# Patient Record
Sex: Female | Born: 1950 | Race: White | Hispanic: No | Marital: Married | Smoking: Never smoker
Health system: Southern US, Community
[De-identification: ages and names within clinical notes are randomized; demographics above are authoritative.]

## PROBLEM LIST (undated history)

## (undated) DIAGNOSIS — I10 Essential (primary) hypertension: Secondary | ICD-10-CM

## (undated) DIAGNOSIS — R053 Chronic cough: Secondary | ICD-10-CM

## (undated) DIAGNOSIS — R05 Cough: Secondary | ICD-10-CM

## (undated) DIAGNOSIS — J479 Bronchiectasis, uncomplicated: Secondary | ICD-10-CM

## (undated) DIAGNOSIS — J189 Pneumonia, unspecified organism: Secondary | ICD-10-CM

## (undated) DIAGNOSIS — C9000 Multiple myeloma not having achieved remission: Secondary | ICD-10-CM

## (undated) DIAGNOSIS — R112 Nausea with vomiting, unspecified: Secondary | ICD-10-CM

## (undated) DIAGNOSIS — Z9889 Other specified postprocedural states: Secondary | ICD-10-CM

## (undated) DIAGNOSIS — T8859XA Other complications of anesthesia, initial encounter: Secondary | ICD-10-CM

## (undated) DIAGNOSIS — H409 Unspecified glaucoma: Secondary | ICD-10-CM

## (undated) DIAGNOSIS — C801 Malignant (primary) neoplasm, unspecified: Secondary | ICD-10-CM

## (undated) DIAGNOSIS — E785 Hyperlipidemia, unspecified: Secondary | ICD-10-CM

## (undated) DIAGNOSIS — R198 Other specified symptoms and signs involving the digestive system and abdomen: Secondary | ICD-10-CM

## (undated) DIAGNOSIS — M8580 Other specified disorders of bone density and structure, unspecified site: Secondary | ICD-10-CM

## (undated) DIAGNOSIS — K219 Gastro-esophageal reflux disease without esophagitis: Secondary | ICD-10-CM

## (undated) DIAGNOSIS — M549 Dorsalgia, unspecified: Secondary | ICD-10-CM

## (undated) DIAGNOSIS — T4145XA Adverse effect of unspecified anesthetic, initial encounter: Secondary | ICD-10-CM

## (undated) DIAGNOSIS — Z8719 Personal history of other diseases of the digestive system: Secondary | ICD-10-CM

## (undated) DIAGNOSIS — R0600 Dyspnea, unspecified: Secondary | ICD-10-CM

## (undated) DIAGNOSIS — M4310 Spondylolisthesis, site unspecified: Secondary | ICD-10-CM

## (undated) DIAGNOSIS — M1990 Unspecified osteoarthritis, unspecified site: Secondary | ICD-10-CM

## (undated) HISTORY — DX: Gastro-esophageal reflux disease without esophagitis: K21.9

## (undated) HISTORY — PX: ABDOMINOPLASTY: SUR9

## (undated) HISTORY — PX: HIP ARTHROPLASTY: SHX981

## (undated) HISTORY — DX: Chronic cough: R05.3

## (undated) HISTORY — DX: Dorsalgia, unspecified: M54.9

## (undated) HISTORY — PX: TONSILLECTOMY: SUR1361

## (undated) HISTORY — DX: Spondylolisthesis, site unspecified: M43.10

## (undated) HISTORY — DX: Malignant (primary) neoplasm, unspecified: C80.1

## (undated) HISTORY — DX: Essential (primary) hypertension: I10

## (undated) HISTORY — PX: FLUOROSCOPY GUIDANCE: CATH118240

## (undated) HISTORY — PX: UMBILICAL HERNIA REPAIR: SHX196

## (undated) HISTORY — PX: COLONOSCOPY: SHX174

## (undated) HISTORY — PX: TOE FUSION: SHX1070

## (undated) HISTORY — DX: Other specified symptoms and signs involving the digestive system and abdomen: R19.8

## (undated) HISTORY — PX: BREAST ENHANCEMENT SURGERY: SHX7

## (undated) HISTORY — PX: JOINT REPLACEMENT: SHX530

## (undated) HISTORY — DX: Cough: R05

---
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
## 1992-07-15 HISTORY — PX: THIGH LIFT: SHX2496

## 1997-07-15 HISTORY — PX: BLEPHAROPLASTY: SUR158

## 2000-07-15 HISTORY — PX: UMBILICAL HERNIA REPAIR: SHX196

## 2000-07-15 HISTORY — PX: ABDOMINOPLASTY: SUR9

## 2002-07-15 HISTORY — PX: FACIAL COSMETIC SURGERY: SHX629

## 2002-07-15 HISTORY — PX: REMOVAL OF BILATERAL TISSUE EXPANDERS WITH PLACEMENT OF BILATERAL BREAST IMPLANTS: SHX6431

## 2012-07-15 HISTORY — PX: DECOMPRESSION CORE HIP: SUR398

## 2015-10-14 HISTORY — PX: ANKLE SURGERY: SHX546

## 2016-07-15 HISTORY — PX: CYSTOURETHROSCOPY: SHX476

## 2017-06-14 HISTORY — PX: PARTIAL COLECTOMY: SHX5273

## 2017-06-26 HISTORY — PX: LAPAROSCOPIC PARTIAL COLECTOMY: SHX5907

## 2017-07-15 HISTORY — PX: MOHS SURGERY: SUR867

## 2018-06-14 HISTORY — PX: BASAL CELL CARCINOMA EXCISION: SHX1214

## 2018-09-16 ENCOUNTER — Other Ambulatory Visit: Payer: Self-pay | Admitting: Neurosurgery

## 2018-09-18 ENCOUNTER — Other Ambulatory Visit: Payer: Self-pay | Admitting: *Deleted

## 2018-10-07 ENCOUNTER — Encounter: Payer: Self-pay | Admitting: Vascular Surgery

## 2018-10-19 ENCOUNTER — Telehealth: Payer: Self-pay | Admitting: *Deleted

## 2018-10-19 NOTE — Telephone Encounter (Signed)
Left voice mail message. 10/28/2018 appointment with Dr. Scot Dock cancelled due to deferred surgery with Dr. Vertell Limber.

## 2018-10-26 ENCOUNTER — Inpatient Hospital Stay (HOSPITAL_COMMUNITY): Admission: RE | Admit: 2018-10-26 | Payer: Medicare Other | Source: Ambulatory Visit

## 2018-10-28 ENCOUNTER — Encounter: Payer: Self-pay | Admitting: Vascular Surgery

## 2018-11-03 ENCOUNTER — Encounter (HOSPITAL_COMMUNITY): Admission: RE | Payer: Self-pay | Source: Ambulatory Visit

## 2018-11-03 ENCOUNTER — Inpatient Hospital Stay (HOSPITAL_COMMUNITY): Admission: RE | Admit: 2018-11-03 | Payer: Medicare Other | Source: Ambulatory Visit | Admitting: Neurosurgery

## 2018-11-03 SURGERY — ANTERIOR LUMBAR FUSION 1 LEVEL
Anesthesia: General

## 2018-11-26 ENCOUNTER — Telehealth: Payer: Self-pay | Admitting: Vascular Surgery

## 2018-11-26 ENCOUNTER — Other Ambulatory Visit: Payer: Self-pay | Admitting: *Deleted

## 2018-11-26 NOTE — Telephone Encounter (Signed)
-----   Message from Willy Eddy, RN sent at 11/25/2018  2:53 PM EDT ----- Regarding: Office appointment Please schedule for office appt with Dr. Scot Dock. "ALIF L5-S1 with Dr. Vertell Limber". Surgery scheduled for 12/29/2018. Remind patient to bring all films, CT etc. To this appointment. Thanks, B

## 2018-11-26 NOTE — Telephone Encounter (Signed)
sch lvm mld ltr 12/16/2018 1120am f/u MD

## 2018-12-16 ENCOUNTER — Other Ambulatory Visit: Payer: Self-pay

## 2018-12-16 ENCOUNTER — Encounter: Payer: Self-pay | Admitting: Vascular Surgery

## 2018-12-16 ENCOUNTER — Ambulatory Visit (INDEPENDENT_AMBULATORY_CARE_PROVIDER_SITE_OTHER): Payer: Medicare Other | Admitting: Vascular Surgery

## 2018-12-16 VITALS — BP 123/87 | HR 86 | Temp 97.3°F | Resp 18 | Ht 65.0 in | Wt 138.7 lb

## 2018-12-16 DIAGNOSIS — M5136 Other intervertebral disc degeneration, lumbar region: Secondary | ICD-10-CM | POA: Diagnosis not present

## 2018-12-16 DIAGNOSIS — M5137 Other intervertebral disc degeneration, lumbosacral region: Secondary | ICD-10-CM

## 2018-12-16 NOTE — Progress Notes (Signed)
REASON FOR CONSULT:    Preop visit prior to ALIF.  The consult is requested by Dr. Vertell Limber.  ASSESSMENT & PLAN:   DEGENERATIVE DISC DISEASE L5-S1: The patient appears to be a good candidate for anterior retroperitoneal exposure of L5-S1. I have reviewed our role in exposure of the spine in order to allow anterior lumbar interbody fusion at the appropriate levels. We have discussed the potential complications of surgery, including but not limited to, arterial or venous injury, thrombosis, or bleeding. We have also discussed the potential risks of wound healing problems, the development of a hernia, nerve injury, leg swelling, or other unpredictable medical problems. All the patient's questions were answered and they are agreeable to proceed. Her surgery is scheduled for 12/29/2018.  [name], MD, FACS Beeper [phone] Office: [phone]   HPI:   Amanda Conner is a pleasant 68 y.o. female, who has had a long history of back pain.  She did have a history of scoliosis and also had a left hip replacement in 2015.  Her back pain is gradually progressed and is been especially severe over the last year.  She is failed conservative treatment.  She experiences pain in her back that radiates down the back of her right leg.  I do not get any history of claudication, rest pain, or nonhealing ulcers.  She is had no previous history of DVT.  She did have a laparoscopic partial colectomy at [facility] in 2018.  This was the descending colon.  She is also had a previous umbilical hernia repair and tummy tuck.  She denies any previous history of DVT or phlebitis.  Past Medical History:  Diagnosis Date  . Back pain   . Cancer (HCC)    basal cell nose  . Chronic cough   . Gastrointestinal symptoms   . GERD (gastroesophageal reflux disease)   . Hypertension   . Spondylisthesis     Family History  Problem Relation Age of Onset  . Hypertension Mother   . COPD Mother   . Hypertension Father   .  Heart disease Father     SOCIAL HISTORY: Social History   Socioeconomic History  . Marital status: Married    Spouse name: Not on file  . Number of children: Not on file  . Years of education: Not on file  . Highest education level: Not on file  Occupational History  . Not on file  Social Needs  . Financial resource strain: Not on file  . Food insecurity:    Worry: Not on file    Inability: Not on file  . Transportation needs:    Medical: Not on file    Non-medical: Not on file  Tobacco Use  . Smoking status: Never Smoker  . Smokeless tobacco: Never Used  Substance and Sexual Activity  . Alcohol use: Yes    Comment: occasional  . Drug use: Never  . Sexual activity: Not on file  Lifestyle  . Physical activity:    Days per week: Not on file    Minutes per session: Not on file  . Stress: Not on file  Relationships  . Social connections:    Talks on phone: Not on file    Gets together: Not on file    Attends religious service: Not on file    Active member of club or organization: Not on file    Attends meetings of clubs or organizations: Not on file    Relationship status: Not on file  .  Intimate partner violence:    Fear of current or ex partner: Not on file    Emotionally abused: Not on file    Physically abused: Not on file    Forced sexual activity: Not on file  Other Topics Concern  . Not on file  Social History Narrative  . Not on file    Allergies  Allergen Reactions  . Amikacin Other (See Comments)    Eosinophilia with chills and aching when given with cefoxitin  . Biaxin [Clarithromycin] Itching  . Cefoxitin Other (See Comments)    Eosinophlia when given with amikacin  . Sulfamethoxazole-Trimethoprim Itching  . Bacitracin-Polymyxin B Rash    Current Outpatient Medications  Medication Sig Dispense Refill  . albuterol (VENTOLIN HFA) 108 (90 Base) MCG/ACT inhaler Inhale 2 puffs into the lungs every 6 (six) hours as needed for wheezing or shortness  of breath.    . Biotin 1000 MCG tablet Take 1,000 mcg by mouth daily.    . Calcium Carb-Cholecalciferol (CALCIUM 600/VITAMIN D3) 600-800 MG-UNIT TABS Take 1 tablet by mouth daily.    . cyclobenzaprine (FLEXERIL) 10 MG tablet Take 10 mg by mouth 3 (three) times daily as needed for muscle spasms.     Marland Kitchen dextromethorphan-guaiFENesin (MUCINEX DM) 30-600 MG 12hr tablet Take 1 tablet by mouth daily.     Marland Kitchen estrogen, conjugated,-medroxyprogesterone (PREMPRO) 0.45-1.5 MG tablet Take 1 tablet by mouth daily.    . fluticasone (FLONASE) 50 MCG/ACT nasal spray Place 1 spray into both nostrils daily.    Marland Kitchen latanoprost (XALATAN) 0.005 % ophthalmic solution Place 1 drop into both eyes at bedtime.     Marland Kitchen lisinopril-hydrochlorothiazide (PRINZIDE,ZESTORETIC) 20-25 MG tablet Take 1 tablet by mouth daily.    Marland Kitchen loratadine (CLARITIN) 10 MG tablet Take 10 mg by mouth daily.    . pantoprazole (PROTONIX) 40 MG tablet Take 40 mg by mouth daily.    . SUMAtriptan (IMITREX) 100 MG tablet Take 100 mg by mouth every 2 (two) hours as needed for migraine. May repeat in 2 hours if headache persists or recurs.     No current facility-administered medications for this visit.     REVIEW OF SYSTEMS:  [X]  denotes positive finding, [ ]  denotes negative finding Cardiac  Comments:  Chest pain or chest pressure:    Shortness of breath upon exertion:    Short of breath when lying flat:    Irregular heart rhythm:        Vascular    Pain in calf, thigh, or hip brought on by ambulation:    Pain in feet at night that wakes you up from your sleep:     Blood clot in your veins:    Leg swelling:         Pulmonary    Oxygen at home:    Productive cough:     Wheezing:         Neurologic    Sudden weakness in arms or legs:     Sudden numbness in arms or legs:     Sudden onset of difficulty speaking or slurred speech:    Temporary loss of vision in one eye:     Problems with dizziness:         Gastrointestinal    Blood in stool:      Vomited blood:         Genitourinary    Burning when urinating:     Blood in urine:        Psychiatric  Major depression:         Hematologic    Bleeding problems:    Problems with blood clotting too easily:        Skin    Rashes or ulcers:        Constitutional    Fever or chills:     PHYSICAL EXAM:   Vitals:   12/16/18 1111  BP: 123/87  Pulse: 86  Resp: 18  Temp: (!) 97.3 F (36.3 C)  SpO2: 96%  Weight: 138 lb 11.2 oz (62.9 kg)  Height: 5\' 5"  (1.651 m)    GENERAL: The patient is a well-nourished female, in no acute distress. The vital signs are documented above. CARDIAC: There is a regular rate and rhythm.  VASCULAR: I do not detect carotid bruits. She has palpable femoral, popliteal, and pedal pulses bilaterally. She has no significant lower extremity swelling. PULMONARY: There is good air exchange bilaterally without wheezing or rales. ABDOMEN: Soft and non-tender with normal pitched bowel sounds.  MUSCULOSKELETAL: There are no major deformities or cyanosis. NEUROLOGIC: No focal weakness or paresthesias are detected. SKIN: There are no ulcers or rashes noted. PSYCHIATRIC: The patient has a normal affect.  DATA:    MRI LUMBAR SPINE: I have reviewed the images of the MRI of the lumbar spine.  There were also some plain films.  I did not see significant calcific disease in the aorta or iliac arteries.  I do not see any obvious complicating factors as it relates to exposure of the L5-S1 disc space.

## 2018-12-16 NOTE — H&P (View-Only) (Signed)
REASON FOR CONSULT:    Preop visit prior to ALIF.  The consult is requested by Dr. Vertell Limber.  ASSESSMENT & PLAN:   DEGENERATIVE DISC DISEASE L5-S1: The patient appears to be a good candidate for anterior retroperitoneal exposure of L5-S1. I have reviewed our role in exposure of the spine in order to allow anterior lumbar interbody fusion at the appropriate levels. We have discussed the potential complications of surgery, including but not limited to, arterial or venous injury, thrombosis, or bleeding. We have also discussed the potential risks of wound healing problems, the development of a hernia, nerve injury, leg swelling, or other unpredictable medical problems. All the patient's questions were answered and they are agreeable to proceed. Her surgery is scheduled for 12/29/2018.  [name], MD, FACS Beeper [phone] Office: [phone]   HPI:   Amanda Conner is a pleasant 68 y.o. female, who has had a long history of back pain.  She did have a history of scoliosis and also had a left hip replacement in 2015.  Her back pain is gradually progressed and is been especially severe over the last year.  She is failed conservative treatment.  She experiences pain in her back that radiates down the back of her right leg.  I do not get any history of claudication, rest pain, or nonhealing ulcers.  She is had no previous history of DVT.  She did have a laparoscopic partial colectomy at [facility] in 2018.  This was the descending colon.  She is also had a previous umbilical hernia repair and tummy tuck.  She denies any previous history of DVT or phlebitis.  Past Medical History:  Diagnosis Date  . Back pain   . Cancer (HCC)    basal cell nose  . Chronic cough   . Gastrointestinal symptoms   . GERD (gastroesophageal reflux disease)   . Hypertension   . Spondylisthesis     Family History  Problem Relation Age of Onset  . Hypertension Mother   . COPD Mother   . Hypertension Father   .  Heart disease Father     SOCIAL HISTORY: Social History   Socioeconomic History  . Marital status: Married    Spouse name: Not on file  . Number of children: Not on file  . Years of education: Not on file  . Highest education level: Not on file  Occupational History  . Not on file  Social Needs  . Financial resource strain: Not on file  . Food insecurity:    Worry: Not on file    Inability: Not on file  . Transportation needs:    Medical: Not on file    Non-medical: Not on file  Tobacco Use  . Smoking status: Never Smoker  . Smokeless tobacco: Never Used  Substance and Sexual Activity  . Alcohol use: Yes    Comment: occasional  . Drug use: Never  . Sexual activity: Not on file  Lifestyle  . Physical activity:    Days per week: Not on file    Minutes per session: Not on file  . Stress: Not on file  Relationships  . Social connections:    Talks on phone: Not on file    Gets together: Not on file    Attends religious service: Not on file    Active member of club or organization: Not on file    Attends meetings of clubs or organizations: Not on file    Relationship status: Not on file  .  Intimate partner violence:    Fear of current or ex partner: Not on file    Emotionally abused: Not on file    Physically abused: Not on file    Forced sexual activity: Not on file  Other Topics Concern  . Not on file  Social History Narrative  . Not on file    Allergies  Allergen Reactions  . Amikacin Other (See Comments)    Eosinophilia with chills and aching when given with cefoxitin  . Biaxin [Clarithromycin] Itching  . Cefoxitin Other (See Comments)    Eosinophlia when given with amikacin  . Sulfamethoxazole-Trimethoprim Itching  . Bacitracin-Polymyxin B Rash    Current Outpatient Medications  Medication Sig Dispense Refill  . albuterol (VENTOLIN HFA) 108 (90 Base) MCG/ACT inhaler Inhale 2 puffs into the lungs every 6 (six) hours as needed for wheezing or shortness  of breath.    . Biotin 1000 MCG tablet Take 1,000 mcg by mouth daily.    . Calcium Carb-Cholecalciferol (CALCIUM 600/VITAMIN D3) 600-800 MG-UNIT TABS Take 1 tablet by mouth daily.    . cyclobenzaprine (FLEXERIL) 10 MG tablet Take 10 mg by mouth 3 (three) times daily as needed for muscle spasms.     Marland Kitchen dextromethorphan-guaiFENesin (MUCINEX DM) 30-600 MG 12hr tablet Take 1 tablet by mouth daily.     Marland Kitchen estrogen, conjugated,-medroxyprogesterone (PREMPRO) 0.45-1.5 MG tablet Take 1 tablet by mouth daily.    . fluticasone (FLONASE) 50 MCG/ACT nasal spray Place 1 spray into both nostrils daily.    Marland Kitchen latanoprost (XALATAN) 0.005 % ophthalmic solution Place 1 drop into both eyes at bedtime.     Marland Kitchen lisinopril-hydrochlorothiazide (PRINZIDE,ZESTORETIC) 20-25 MG tablet Take 1 tablet by mouth daily.    Marland Kitchen loratadine (CLARITIN) 10 MG tablet Take 10 mg by mouth daily.    . pantoprazole (PROTONIX) 40 MG tablet Take 40 mg by mouth daily.    . SUMAtriptan (IMITREX) 100 MG tablet Take 100 mg by mouth every 2 (two) hours as needed for migraine. May repeat in 2 hours if headache persists or recurs.     No current facility-administered medications for this visit.     REVIEW OF SYSTEMS:  [X]  denotes positive finding, [ ]  denotes negative finding Cardiac  Comments:  Chest pain or chest pressure:    Shortness of breath upon exertion:    Short of breath when lying flat:    Irregular heart rhythm:        Vascular    Pain in calf, thigh, or hip brought on by ambulation:    Pain in feet at night that wakes you up from your sleep:     Blood clot in your veins:    Leg swelling:         Pulmonary    Oxygen at home:    Productive cough:     Wheezing:         Neurologic    Sudden weakness in arms or legs:     Sudden numbness in arms or legs:     Sudden onset of difficulty speaking or slurred speech:    Temporary loss of vision in one eye:     Problems with dizziness:         Gastrointestinal    Blood in stool:      Vomited blood:         Genitourinary    Burning when urinating:     Blood in urine:        Psychiatric  Major depression:         Hematologic    Bleeding problems:    Problems with blood clotting too easily:        Skin    Rashes or ulcers:        Constitutional    Fever or chills:     PHYSICAL EXAM:   Vitals:   12/16/18 1111  BP: 123/87  Pulse: 86  Resp: 18  Temp: (!) 97.3 F (36.3 C)  SpO2: 96%  Weight: 138 lb 11.2 oz (62.9 kg)  Height: 5\' 5"  (1.651 m)    GENERAL: The patient is a well-nourished female, in no acute distress. The vital signs are documented above. CARDIAC: There is a regular rate and rhythm.  VASCULAR: I do not detect carotid bruits. She has palpable femoral, popliteal, and pedal pulses bilaterally. She has no significant lower extremity swelling. PULMONARY: There is good air exchange bilaterally without wheezing or rales. ABDOMEN: Soft and non-tender with normal pitched bowel sounds.  MUSCULOSKELETAL: There are no major deformities or cyanosis. NEUROLOGIC: No focal weakness or paresthesias are detected. SKIN: There are no ulcers or rashes noted. PSYCHIATRIC: The patient has a normal affect.  DATA:    MRI LUMBAR SPINE: I have reviewed the images of the MRI of the lumbar spine.  There were also some plain films.  I did not see significant calcific disease in the aorta or iliac arteries.  I do not see any obvious complicating factors as it relates to exposure of the L5-S1 disc space.

## 2018-12-18 NOTE — H&P (Signed)
Patient ID:   [id number] Patient: Amanda Conner  Date of Birth: 01/28/1951 Visit Type: Office Visit   Date: 09/09/2018 12:15 PM Provider: Marchia Meiers. Vertell Limber MD   This 68 year old female presents for Follow Up of back and leg.  HISTORY OF PRESENT ILLNESS:  1.  Follow Up of back and leg  Patient returns to review her MRI and bone density. She endorses unchanged low back and right lower extremity pain into her foot. Patient notes that her pain has worsened since her previous MRI performed on 09/09/17.  09/09/18 L-spine MRI without contrast 1. Stable scoliosis and multilevel spondylosis. No significant change to explain progression of symptoms. 2. Rightward curvature of the lumbar spine is centered at L3-4. 3. Mild foraminal narrowing bilaterally at L2-3, L3-4, and L4-5. 4. Mild right foraminal narrowing at L5-S1 is stable.  09/01/17 Bone densitometry Bone densitometry reveals osteopenia with a T-score of -2.2 at the neck of the right femur - patient is not currently taking any medications for osteopenia         Medical/Surgical/Interim History Reviewed, no change.  Last detailed document date:08/26/2018.     Family History:  Reviewed, no changes.  Last detailed document date:08/26/2018.   Social History: Reviewed, no changes. Last detailed document date: 08/26/2018.    MEDICATIONS: (added, continued or stopped this visit) Started Medication Directions Instruction Stopped   Calcium 500 + D 500 mg (1,250 mg)-400 unit tablet      Claritin 10 mg tablet take 1 tablet by oral route  every day     cyclobenzaprine 10 mg tablet take 1 tablet by oral route 3 times every day     latanoprost (PF) 0.005 % eye drops      losartan 50 mg-hydrochlorothiazide 12.5 mg tablet take 1 tablet by oral route  every day     Mucinex DM take 1 tablet by oral route  every 12 hours as needed     pantoprazole 40 mg tablet,delayed release take 1 tablet by oral route  every day     Prempro 0.45 mg-1.5  mg tablet take 1 tablet by oral route  every day     sumatriptan 100 mg tablet take 1 tablet by oral route after onset of migraine; may repeat after 2 hours if headache returns,not to exceed 276m in 24hrs     Ventolin HFA 90 mcg/actuation aerosol inhaler inhale 2 puff by inhalation route  every 4 - 6 hours as needed       ALLERGIES: Ingredient Reaction Medication Name Comment  CLARITHROMYCIN  Biaxin       PHYSICAL EXAM:   Vitals Date Temp F BP Pulse Ht In Wt Lb BMI BSA Pain Score  09/09/2018  108/76 65 65 137 22.8  3/10      IMPRESSION:   L-spine MRI without contrast reveals rightward curvature with apex at L3-4. Mild right foraminal narrowing at L5-S1 is stable. Mild foraminal narrowing is present bilaterally at L2-3, L3-4, and L4-5.  AP + lateral scoliosis X-rays reveal exaggerated thoracic curvature.  Bone densitometry reveals that patient's bone density is acceptable to proceed with surgical intervention. Recommended surgical intervention - tentative plan of L5-S1 ALIF and L2-3, L3-4, L4-5 XLIF with percutaneous screws. Advised patient that surgery will improve the alignment of her spine but will not completely correct the exaggerated curvature. Patient endorses that she wishes to proceed with surgery.  PLAN:  1. Nurse education given 2. LSO brace fitted 3. L5-S1 ALIF and L2-5 XLIF with percutaneous screws  4. Follow-up after surgery  Orders: Diagnostic Procedures: Assessment Procedure  M54.16 Lumbar Spine- AP/Lat  Miscellaneous: Assessment   M48.07 LSO Brace   Assessment/Plan   # Detail Type Description   1. Assessment Spinal stenosis of lumbosacral region (M48.07).   Plan Orders LSO Brace.       2. Assessment Lumbar radiculopathy (M54.16).       3. Assessment Other idiopathic scoliosis, site unspecified (M41.20).                     Provider:  Marchia Meiers. Vertell Limber MD  09/10/2018 02:50 PM Dictation edited by: Mirian Mo    CC Providers: Lucianne Lei [address] [place]  [address]   Byrd Hesselbach  [address] [place] [address]               Electronically signed by Marchia Meiers. Vertell Limber MD on 09/12/2018 12:42 PM  Patient ID:   [id number] Patient: Amanda Conner  Date of Birth: [birth year] Visit Type: Office Visit   Date: 08/26/2018 11:30 AM Provider: Marchia Meiers. Vertell Limber MD   This 68 year old female presents for back and leg pain.  HISTORY OF PRESENT ILLNESS:  1.  back and leg pain  Amanda Conner, 67 year old female, visits for evaluation.  She reports no injury, noting increasing back pain x4 years since left hip replacement.  She notes significant increase in lumbar and right leg pain over the last year.  She is no longer able to exercise.  Currently, she is unable to sleep in bed, and notes significant pain upon rising from the couch.  Walking distance also proves painful. Patient's pain is alleviated by sitting. Patient notes that sleeping supine causes severe back and right leg pain into the 5th digit of her right foot. She notes that her back pain is constant but her right lower extremity pain is not. She denies any left lower extremity pain.  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, GERD Surgical history:  December 2019 knows skin cancer, December 2018 partial colectomy, left ankle 2017, left THR November 2018  MRI and x-ray on [place]          PAST MEDICAL/SURGICAL HISTORY:   (Detailed)    Disease/disorder Onset Date Management Date Comments    MOHS basal cell removed      Partial Colectomy      L  ankle surgery      Hip replacement    basal cell nose    JMP 08/26/2018 -  Hypertension      MAC lung infection         PAST MEDICAL HISTORY, SURGICAL HISTORY, FAMILY HISTORY, SOCIAL HISTORY AND REVIEW OF SYSTEMS I have reviewed the patient's past medical, surgical, family and social history as well as the comprehensive review of systems as included on the [facility] history form dated 08/26/2018, which I have signed.  Family History:  (Detailed) Relationship Family Member Name Deceased Age at Death Condition Onset Age Cause of Death  Father    Heart Disease  N  Father    Hypertension  N  Mother    Hypertension  N  Mother    COPD  N     Social History:  (Detailed) Tobacco use reviewed. Preferred language is [place].   Tobacco use status: Current non-smoker. Smoking status: Never smoker.  SMOKING STATUS Type Smoking Status Usage Per Day Years Used Total Pack Years   Never smoker      TOBACCO/VAPING EXPOSURE No passive vaping exposure. No passive smoke exposure.       MEDICATIONS: (added, continued or stopped this visit) Started Medication Directions Instruction Stopped   Calcium 500 + D 500 mg (1,250 mg)-400 unit tablet      Claritin 10 mg tablet take 1 tablet by oral route  every day     cyclobenzaprine 10 mg tablet take 1 tablet by oral route 3 times every day     latanoprost (PF) 0.005 % eye drops      losartan 50 mg-hydrochlorothiazide 12.5 mg tablet take 1 tablet by oral route  every day     Mucinex DM take 1 tablet by oral route  every 12 hours as needed     pantoprazole 40 mg tablet,delayed release take 1 tablet by oral route  every day     Prempro 0.45 mg-1.5 mg tablet take 1 tablet by oral route  every day     sumatriptan 100 mg tablet take 1 tablet by oral route after onset of migraine; may repeat after 2 hours if headache returns,not to exceed 230m in 24hrs     Ventolin HFA 90 mcg/actuation aerosol inhaler inhale 2 puff by  inhalation route  every 4 - 6 hours as needed       ALLERGIES: Ingredient Reaction Medication Name Comment  CLARITHROMYCIN  Biaxin    Reviewed, updated.   REVIEW OF SYSTEMS   See scanned patient registration form, dated 08/26/2018, signed and dated on 08/27/2018  Review of Systems Details System Neg/Pos Details  Constitutional Negative Chills, Fatigue, Fever, Malaise, Night sweats, Weight gain and Weight loss.  ENMT Negative Ear drainage, Hearing loss, Nasal drainage, Otalgia, Sinus pressure and Sore throat.  Eyes Negative Eye discharge, Eye pain and Vision changes.  Respiratory Negative Chronic cough, Cough, Dyspnea, Known TB exposure and Wheezing.  Cardio Negative Chest pain, Claudication, Edema and Irregular heartbeat/palpitations.  GI Negative Abdominal pain, Blood in stool, Change in stool pattern, Constipation, Decreased appetite, Diarrhea, Heartburn, Nausea and Vomiting.  GU Negative Dysuria, Hematuria, Polyuria (Genitourinary), Urinary frequency, Urinary incontinence and Urinary retention.  Endocrine Negative Cold intolerance, Heat intolerance, Polydipsia and Polyphagia.  Neuro Negative Dizziness, Extremity weakness, Gait disturbance, Headache, Memory impairment, Numbness in extremity, Seizures and Tremors.  Psych Negative Anxiety, Depression and Insomnia.  Integumentary Negative Brittle hair, Brittle nails, Change in shape/size of mole(s), Hair loss, Hirsutism, Hives, Pruritus, Rash and Skin lesion.  MS Positive Back pain, RLE.  Hema/Lymph Negative Easy bleeding, Easy bruising and Lymphadenopathy.  Allergic/Immuno Negative Contact allergy, Environmental allergies, Food allergies and Seasonal allergies.  Reproductive Negative Breast discharge, Breast lumps, Dysmenorrhea, Dyspareunia, History of abnormal PAP smear, Hot flashes, Irregular menses and Vaginal discharge.   PHYSICAL EXAM:   Vitals Date Temp F BP Pulse Ht In Wt Lb BMI BSA Pain Score  08/26/2018  149/101 88 65  140 23.3  4/10    PHYSICAL EXAM Details General Level of Distress: no acute distress Overall Appearance: normal  Head and Face  Right Left  Fundoscopic Exam:  normal normal    Cardiovascular Cardiac: regular rate and rhythm without murmur  Right Left  Carotid Pulses: normal normal  Respiratory Lungs: clear to auscultation  Neurological Orientation: normal Recent and Remote Memory: normal Attention Span and Concentration:   normal Language: normal Fund of Knowledge: normal  Right Left Sensation: normal normal Upper Extremity Coordination: normal normal  Lower Extremity Coordination: normal normal  Musculoskeletal Gait and Station: normal  Right Left Upper Extremity Muscle Strength: normal normal Lower Extremity Muscle Strength: normal normal Upper Extremity Muscle Tone:  normal normal Lower Extremity Muscle Tone: normal normal   Motor Strength Upper and lower extremity motor strength was tested in the clinically pertinent muscles.     Deep Tendon Reflexes  Right Left Biceps: normal normal Triceps: normal normal Brachioradialis: normal normal Patellar: normal normal Achilles: normal normal  Cranial Nerves II. Optic Nerve/Visual Fields: normal III. Oculomotor: normal IV. Trochlear: normal V. Trigeminal: normal VI. Abducens: normal VII. Facial: normal VIII. Acoustic/Vestibular: normal IX. Glossopharyngeal: normal X. Vagus: normal XI. Spinal Accessory: normal XII. Hypoglossal: normal  Motor and other Tests Lhermittes: negative Rhomberg: negative Pronator drift: absent     Right Left Hoffman's: normal normal Clonus: normal normal Babinski: normal normal   Additional Findings:  Upon examination, able to touch toes during toe touch, able to stand on heels and toes, full strength in bilateral upper extremities, full strength in bilateral lower extremities, reflexes symmetric, normal pin sensitivity in bilateral feet, negative seated SLR  bilaterally, leg length discrepancy between left and right side.   DIAGNOSTIC RESULTS:   09/09/17 L-spine MRI without contrast 1. Mild central spinal stenosis at L3-4 without impingement. 2. Right paracentral disc protrusion at L4-5 with impingement of the right-sided L5 nerve root in the lateral recess of the neural canal. 3. Right foraminal stenosis at L5-S1 with impingement of the exiting L5 nerve root in the spinal foramen.    IMPRESSION:   L-spine MRI without contrast reveals rightward disc protrusion and osseous spurring at L4-5 with impingement of the right L5 nerve root, retrolisthesis of L3 on L4, sacralized L5 vertebra, right-sided facet arthropathy at L5-S1, bulging disc at L2-3.  4-view L-spine X-rays reveals retrolisthesis at L2-3 and L3-4, lumbar scoliosis.  AP + lateral scoliosis X-rays reveal exaggerated thoracic curvature  Upon examination, able to touch toes during toe touch, able to stand on heels and toes, full strength in bilateral upper extremities, full strength in bilateral lower extremities, reflexes symmetric, normal pin sensitivity in bilateral feet, negative seated SLR bilaterally, leg length discrepancy between left and right side.   Recommended surgical intervention. Described and discussed potential surgery with patient at length. Tentative surgical plan is  L2-5 XLIF and L5-S1 ALIF with percutaneous screws. Advised patient that a vascular surgeon would be present to assist during surgery. Advised patient a PLIF would increases risks of surgery and would be to provide the necessary amount of curvature correction. Recommended new L-spine MRI without contrast with calculations to be performed after imaging is complete. Requesting record of previous bone densitometry with new bone densitometry to be performed if previous testing is greater than 40 years old. Patient will follow-up after imaging is complete.  PLAN:  1. Requesting result of previous bone densitometry  - new bone densitometry to be performed if previous result is greater than 78 years old 2. L-spine MRI without contrast scheduled 3. Calculations to be performed after imaging is complete 4. Tentative L2-5 XLIF and L5-S1 ALIF with percutaneous screws 5. Follow-up after imaging is complete  Orders: Diagnostic Procedures: Assessment Procedure  M41.20 MRI Spine/lumb W/o Contrast  M41.20  Scoliosis- AP/Lat  M54.16 Lumbar Spine- AP/Lat/Flex/Ex  M54.16 MRI Spine/lumb W/o Contrast   Completed Orders (this encounter) Order Details Reason Side Interpretation Result Initial Treatment Date Region  Scoliosis- AP/Lat 1 of 2     08/26/2018 All Levels to All Levels  Lumbar Spine- AP/Lat/Flex/Ex 2 of 2     08/26/2018 All Levels to All Levels   Assessment/Plan   # Detail Type Description   1. Assessment Low back pain, unspecified back pain laterality, with sciatica presence unspecified (M54.5).       2. Assessment Spinal stenosis of lumbosacral region (M48.07).       3. Assessment Lumbar radiculopathy (M54.16).       4. Assessment Scoliosis (and kyphoscoliosis), idiopathic (M41.20).         Pain Management Plan Location: back and leg. Onset: 08/26/2014. Duration: varies. Quality: discomforting. Pain management follow-up plan of care: Patient taking medication as prescribed.              Provider:  Marchia Meiers. Vertell Limber MD  08/28/2018 12:24 AM Dictation edited by: Mirian Mo    CC Providers: Lucianne Lei [address] [place]  [address]   Byrd Hesselbach  [address] [place] [address]               Electronically signed by Marchia Meiers. Vertell Limber MD on 08/28/2018 12:47 PM

## 2018-12-25 ENCOUNTER — Encounter (HOSPITAL_COMMUNITY): Payer: Self-pay

## 2018-12-25 ENCOUNTER — Other Ambulatory Visit: Payer: Self-pay

## 2018-12-25 ENCOUNTER — Other Ambulatory Visit (HOSPITAL_COMMUNITY)
Admission: RE | Admit: 2018-12-25 | Discharge: 2018-12-25 | Disposition: A | Discharge status: Home / Self Care | Payer: Medicare Other | Source: Ambulatory Visit | Attending: Neurosurgery | Admitting: Neurosurgery

## 2018-12-25 ENCOUNTER — Encounter (HOSPITAL_COMMUNITY)
Admission: RE | Admit: 2018-12-25 | Discharge: 2018-12-25 | Disposition: A | Discharge status: Home / Self Care | Payer: Medicare Other | Source: Ambulatory Visit | Attending: Neurosurgery | Admitting: Neurosurgery

## 2018-12-25 DIAGNOSIS — I5032 Chronic diastolic (congestive) heart failure: Secondary | ICD-10-CM | POA: Diagnosis not present

## 2018-12-25 DIAGNOSIS — Z01818 Encounter for other preprocedural examination: Secondary | ICD-10-CM | POA: Diagnosis present

## 2018-12-25 DIAGNOSIS — Z9049 Acquired absence of other specified parts of digestive tract: Secondary | ICD-10-CM | POA: Insufficient documentation

## 2018-12-25 DIAGNOSIS — Z1159 Encounter for screening for other viral diseases: Secondary | ICD-10-CM | POA: Diagnosis not present

## 2018-12-25 DIAGNOSIS — H409 Unspecified glaucoma: Secondary | ICD-10-CM | POA: Diagnosis not present

## 2018-12-25 DIAGNOSIS — K219 Gastro-esophageal reflux disease without esophagitis: Secondary | ICD-10-CM | POA: Insufficient documentation

## 2018-12-25 DIAGNOSIS — Z79899 Other long term (current) drug therapy: Secondary | ICD-10-CM | POA: Diagnosis not present

## 2018-12-25 DIAGNOSIS — Z7951 Long term (current) use of inhaled steroids: Secondary | ICD-10-CM | POA: Diagnosis not present

## 2018-12-25 DIAGNOSIS — I10 Essential (primary) hypertension: Secondary | ICD-10-CM | POA: Diagnosis not present

## 2018-12-25 DIAGNOSIS — Z85828 Personal history of other malignant neoplasm of skin: Secondary | ICD-10-CM | POA: Insufficient documentation

## 2018-12-25 DIAGNOSIS — M4807 Spinal stenosis, lumbosacral region: Secondary | ICD-10-CM | POA: Insufficient documentation

## 2018-12-25 DIAGNOSIS — E785 Hyperlipidemia, unspecified: Secondary | ICD-10-CM | POA: Diagnosis not present

## 2018-12-25 DIAGNOSIS — I110 Hypertensive heart disease with heart failure: Secondary | ICD-10-CM | POA: Insufficient documentation

## 2018-12-25 HISTORY — DX: Unspecified glaucoma: H40.9

## 2018-12-25 HISTORY — DX: Other specified disorders of bone density and structure, unspecified site: M85.80

## 2018-12-25 HISTORY — DX: Other specified postprocedural states: R11.2

## 2018-12-25 HISTORY — DX: Other complications of anesthesia, initial encounter: T88.59XA

## 2018-12-25 HISTORY — DX: Unspecified osteoarthritis, unspecified site: M19.90

## 2018-12-25 HISTORY — DX: Pneumonia, unspecified organism: J18.9

## 2018-12-25 HISTORY — DX: Personal history of other diseases of the digestive system: Z87.19

## 2018-12-25 HISTORY — DX: Dyspnea, unspecified: R06.00

## 2018-12-25 HISTORY — DX: Hyperlipidemia, unspecified: E78.5

## 2018-12-25 HISTORY — DX: Other specified postprocedural states: Z98.890

## 2018-12-25 HISTORY — DX: Bronchiectasis, uncomplicated: J47.9

## 2018-12-25 LAB — CBC
HCT: 38.6 % (ref 36.0–46.0)
Hemoglobin: 12.6 g/dL (ref 12.0–15.0)
MCH: 29.4 pg (ref 26.0–34.0)
MCHC: 32.6 g/dL (ref 30.0–36.0)
MCV: 90.2 fL (ref 80.0–100.0)
Platelets: 366 10*3/uL (ref 150–400)
RBC: 4.28 MIL/uL (ref 3.87–5.11)
RDW: 15.5 % (ref 11.5–15.5)
WBC: 6.8 10*3/uL (ref 4.0–10.5)
nRBC: 0 % (ref 0.0–0.2)

## 2018-12-25 LAB — BASIC METABOLIC PANEL
Anion gap: 10 (ref 5–15)
BUN: 18 mg/dL (ref 8–23)
CO2: 28 mmol/L (ref 22–32)
Calcium: 9.4 mg/dL (ref 8.9–10.3)
Chloride: 99 mmol/L (ref 98–111)
Creatinine, Ser: 0.74 mg/dL (ref 0.44–1.00)
GFR calc Af Amer: >60 mL/min (ref >60)
GFR calc non Af Amer: >60 mL/min (ref >60)
Glucose, Bld: 94 mg/dL (ref 70–99)
Potassium: 4.6 mmol/L (ref 3.5–5.1)
Sodium: 137 mmol/L (ref 135–145)

## 2018-12-25 LAB — ABO/RH: ABO/RH(D): A POS

## 2018-12-25 LAB — SURGICAL PCR SCREEN
MRSA, PCR: NEGATIVE
Staphylococcus aureus: NEGATIVE

## 2018-12-25 NOTE — Pre-Procedure Instructions (Signed)
Amanda Conner  12/25/2018     Your procedure is scheduled on Tuesday, June 16.  Report to [facility], Main Entrance or Entrance "A" at 5:30 AM                     Your surgery or procedure is scheduled for 7:30 A.M.   Call this number if you have problems the morning of surgery [phone]  This is the number for the Pre- Surgical Desk.   Remember:  Do not eat or drink after midnight Monday, June 15    Take these medicines the morning of surgery with A SIP OF WATER: estrogen, conjugated,-medroxyprogesterone (PREMPRO)  fluticasone (FLONASE)  loratadine (CLARITIN) pantoprazole (PROTONIX)  dextromethorphan-guaiFENesin (Galva DM)   Take if Needed: SUMAtriptan (IMITREX) cyclobenzaprine (FLEXERIL) albuterol (VENTOLIN HFA) - please bring this with you     [facility]- Preparing For Surgery  Before surgery, you can play an important role. Because skin is not sterile, your skin needs to be as free of germs as possible. You can reduce the number of germs on your skin by washing with CHG (chlorahexidine gluconate) Soap before surgery.  CHG is an antiseptic cleaner which kills germs and bonds with the skin to continue killing germs even after washing.    Oral Hygiene is also important to reduce your risk of infection.  Remember - BRUSH YOUR TEETH THE MORNING OF SURGERY WITH YOUR REGULAR TOOTHPASTE  Please do not use if you have an allergy to CHG or antibacterial soaps. If your skin becomes reddened/irritated stop using the CHG.  Do not shave (including legs and underarms) for at least 48 hours prior to first CHG shower. It is OK to shave your face.  Please follow these instructions carefully.   1. Shower the NIGHT BEFORE SURGERY and the MORNING OF SURGERY with CHG.   2. If you chose to wash your hair, wash your hair first as usual with your normal shampoo.  3. After you shampoo, wash your face and private area with the soap you use at home, then rinse your hair and  body thoroughly to remove the shampoo and soap.  4. Use CHG as you would any other liquid soap. You can apply CHG directly to the skin and wash gently with a scrungie or a clean washcloth.   5. Apply the CHG Soap to your body ONLY FROM THE NECK DOWN.  Do not use on open wounds or open sores. Avoid contact with your eyes, ears, mouth and genitals (private parts).   6. Wash thoroughly, paying special attention to the area where your surgery will be performed.  7. Thoroughly rinse your body with warm water from the neck down.  8. DO NOT shower/wash with your normal soap after using and rinsing off the CHG Soap.  9. Pat yourself dry with a CLEAN TOWEL.  10. Wear CLEAN PAJAMAS to bed the night before surgery, wear comfortable clothes the morning of surgery  11. Place CLEAN SHEETS on your bed the night of your first shower and DO NOT SLEEP WITH PETS.    Day of Surgery: Shower as instructed above   Do not wear lotions, powders, or perfumes, or deodorant. Please wear clean clothes to the hospital/surgery center.   Remember to brush your teeth WITH YOUR REGULAR TOOTHPASTE.  Do not wear jewelry, make-up or nail polish.  Do not shave 48 hours prior to surgery.  Do not bring valuables to the hospital.  [facility]  is not responsible for any belongings or valuables.  Contacts, dentures or bridgework may not be worn into surgery.  Leave your suitcase in the car.  After surgery it may be brought to your room.  For patients admitted to the hospital, discharge time will be determined by your treatment team.  Please read over the following fact sheets that you were given: Pain Management, Coughing and Deep Breathing, Surgical Site Infections, 10 Things you can do to [place] at [facility]

## 2018-12-25 NOTE — Progress Notes (Addendum)
PCP - Dr Laney Pastor [facility]  Cardiologist - Dr Louann Liv- Angelina Sheriff- I requested last office records and EKG  And ECHO  Pulmonologist - Dr Koleen Nimrod.  Patient reports that Dr Koleen Nimrod is aware of surgery and he is ok with it.  Chest x-ray - na  EKG - requested - had one January 2020  Stress Test - no  ECHO - requested  Cardiac Cath -   Sleep Study - no CPAP - no  LABS- CBC, BMP , T/S, PCR  ASA-no  ERAS-no  HA1C-na Fasting Blood Sugar - na Checks Blood Sugar _0____ times a day  Anesthesia-  Pt denies having chest pain, sob, or fever at this time. All instructions explained to the pt, with a verbal understanding of the material. Pt agrees to go over the instructions while at home for a better understanding. The opportunity to ask questions was provided.  Amanda Conner denies that she nor her family has experienced any of the following: Cough Fever >100.4 Runny Nose Sore Throat Difficulty breathing/ shortness of breath Travel in past 14 days- no Patient will have COVID test today and will go home and quarantine with husband

## 2018-12-26 LAB — NOVEL CORONAVIRUS, NAA (HOSP ORDER, SEND-OUT TO REF LAB; TAT 18-24 HRS): SARS-CoV-2, NAA: NOT DETECTED

## 2018-12-28 ENCOUNTER — Encounter (HOSPITAL_COMMUNITY): Payer: Self-pay

## 2018-12-28 NOTE — Anesthesia Preprocedure Evaluation (Addendum)
Anesthesia Evaluation  Patient identified by MRN, date of birth, ID band Patient awake    Reviewed: Allergy & Precautions, NPO status , Patient's Chart, lab work & pertinent test results  Airway Mallampati: I  TM Distance: >3 FB     Dental  (+) Dental Advisory Given   Pulmonary shortness of breath, pneumonia (bronchiectasis),    breath sounds clear to auscultation       Cardiovascular hypertension, Pt. on medications  Rhythm:Regular Rate:Normal     Neuro/Psych negative neurological ROS     GI/Hepatic Neg liver ROS, hiatal hernia, GERD  ,  Endo/Other  negative endocrine ROS  Renal/GU negative Renal ROS     Musculoskeletal  (+) Arthritis ,   Abdominal   Peds  Hematology negative hematology ROS (+)   Anesthesia Other Findings   Reproductive/Obstetrics                           Lab Results  Component Value Date   WBC 6.8 12/25/2018   HGB 12.6 12/25/2018   HCT 38.6 12/25/2018   MCV 90.2 12/25/2018   PLT 366 12/25/2018   Lab Results  Component Value Date   CREATININE 0.74 12/25/2018   BUN 18 12/25/2018   NA 137 12/25/2018   K 4.6 12/25/2018   CL 99 12/25/2018   CO2 28 12/25/2018    Anesthesia Physical Anesthesia Plan  ASA: III  Anesthesia Plan: General   Post-op Pain Management:    Induction: Intravenous  PONV Risk Score and Plan: 3 and Dexamethasone, Ondansetron and Treatment may vary due to age or medical condition  Airway Management Planned: Oral ETT  Additional Equipment: Arterial line  Intra-op Plan:   Post-operative Plan: Extubation in OR  Informed Consent: I have reviewed the patients History and Physical, chart, labs and discussed the procedure including the risks, benefits and alternatives for the proposed anesthesia with the patient or authorized representative who has indicated his/her understanding and acceptance.     Dental advisory given  Plan  Discussed with: CRNA  Anesthesia Plan Comments: ( )      Anesthesia Quick Evaluation

## 2018-12-28 NOTE — Progress Notes (Addendum)
Anesthesia Chart Review:  Case: [id number] Date/Time: 12/29/18 0715   Procedures:      Lumbar 5 Sacral 1 Anterior lumbar interbody fusion (N/A ) - Lumbar 5 Sacral 1 Anterior lumbar interbody fusion     Lumbar 2 to Lumbar 5 Anterolateral lumbar interbody fusion with percutaneous pedicle screw fixation (N/A ) - Lumbar 2 to Lumbar 5 Anterolateral lumbar interbody fusion with percutaneous pedicle screw fixation     ABDOMINAL EXPOSURE (N/A )   Anesthesia type: General   Pre-op diagnosis: Spinal stenosis of lumbosacral region   Location: [facility] OR ROOM 21 / [place] [facility]   Surgeon: Erline Levine, MD; Angelia Mould, MD      DISCUSSION: Patient is a 68 year old female scheduled for the above procedure.  History includes never smoker, post-operative N/V, HTN, GERD, hiatal hernia, glaucoma, bronchiectasis, dyspnea, AI, chronic diastolic CHF, HTN, venous insufficiency (s/p RFA ablations x3: RGSV, RSSV, LSSV, 2016), diverticulitis (s/p partial colectomy 06/26/17), skin cancer (s/p BCC excision 06/2018).  Awaiting last [facility] records (requested 12/28/18), but was seen by [facility] 09/07/18. Dr. Lorenda Cahill wrote that she has a history of bronchiectasis with history of MSSA, MAC, and M abscessus pulmonary infections that seemed to respond well to prolonged course of oral cephalexin. Sputum specimen from 06/2018 only grew oropharyngeal flora. Given she was considering back surgery wanted to put off considering further medical treatment for NTM (nontuberculous mycobacterial) lung disease, which Dr. Lorenda Cahill thought was "reasonable". He recommended another sputum specimen and "If she grows MSSA again, I would restart cephalexin for another long course, with the idea of reducing risk of respiratory complications associated with the back surgery. 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 No acid fast bacilli seen.  She was last seen by cardiologist Dr. Sondra Come on 07/20/18. (Of note, she is under the name "Amanda Conner" at [facility].) Last visit 07/20/18 for HTN, venous insufficiency, PVD, and AI follow-up. Last echo showed only mild AI (06/2018). From a cardiac standpoint overall was doing well, but had reported a single episode of chest pain a few weeks prior. Although she had not have recurrent symptoms, it had concerned her, so he ordered a Lexiscan Cardiolite stress test. She never had it done. Because she is is now scheduled for surgery, I called and spoke with Kieth Brightly at Dr. Everitt Amber office about planned surgery and indicated that we would need preoperative input for surgery. Dr. Everitt Amber note mentioned twice that the chest pain episode concerned her, so it may be that he ordered the test more for her piece of mind. Regardless, if he felt the stress test was warranted prior to surgery then surgery would need to be postponed. Kieth Brightly called me back after speaking with Dr. Sondra Come and after she or another staff member had spoken with patient. Dr. Sondra Come did not feel that patient would require a stress test prior to planned surgery.  Preoperative cardiology clearance letter by Donn Pierini, MD from 12/28/18 states: "Dear Dr. Erline Levine,  This is to inform you that the above mentioned patient was evaluated by me and has been cleared for her upcoming surgery. I feel that she is low risk from the cardiovascular standpoint.  I have instructed the patient to contact our office if there are any new symptoms prior  to the date of scheduled surgery..."  12/25/18 COVID 19 test was negative. If no acute cardiopulmonary symptoms then I would anticipate that she can proceed as planned.   VS: BP (!) 142/85   Pulse 86   Temp 36.5 C   Resp 20   Ht _0  (1.626 m)   Wt 63.6 kg   SpO2 98%   BMI  24.08 kg/m     PROVIDERS: [place], Jenetta Downer, MD is PCP Angelina Sheriff, [place]) - Donn Pierini, MD is cardiologist ([place]).Linde Gillis, MD is pulmonologist ([place]; [phone]) - Brigitte Pulse, MD is ID ([place]).     LABS: Labs reviewed: Acceptable for surgery. CBC and BMET WNL.  (all labs ordered are listed, but only abnormal results are displayed)  Labs Reviewed  ABO/RH    EKG: 07/20/18 ([facility]): SR, low voltage in precordial leads. Anterior infarct (age undetermined).    CV: Echo 07/10/18 ([facility]): Impression: Normal LV size with normal systolic function. The EF is 60-65%. Grade 1 diastolic dysfunction. There is normal LV wall thickness. Normal RV size with normal function. Abnormal, trileaflet aortic valve. There is mild aortic regurgitation and aortic valve sclerosis.  There is mild mitral regurgitation. Pulmonary artery pressure cannot be estimated due to the inability to fully image the tricuspid regurgitation envelope.  (PASP = 18 mmHg). There is mild pulmonic regurgitation.  According to 07/20/18 note by Dr. Sondra Come, "Lexiscan Cardiolite stress test in 02/2015 which revealed no ischemia."   Past Medical History:  Diagnosis Date  . Arthritis    osteoarthritis  . Back pain   . Bronchiectasis ([place])   . Cancer ([facility])    basal cell nose  . Chronic cough   . Complication of anesthesia   . Dyspnea   . Gastrointestinal symptoms   . GERD (gastroesophageal reflux disease)   . Glaucoma   . History of hiatal hernia   . Hyperlipemia   . Hypertension   . Osteopenia   . Pneumonia   . PONV (postoperative nausea and vomiting)    "only one time"  . Spondylisthesis     Past Surgical History:  Procedure Laterality Date  . ABDOMINOPLASTY  2002  . ABDOMINOPLASTY  1970's  . ANKLE SURGERY  10/2015  . BASAL CELL CARCINOMA EXCISION  06/2018   nose  . BLEPHAROPLASTY Bilateral 1999  . BREAST ENHANCEMENT  SURGERY  1970's  . COLONOSCOPY    . CYSTOURETHROSCOPY  2018   D[place] J ureteal stents  . DECOMPRESSION CORE HIP Left 2014  . FACIAL COSMETIC SURGERY  2004  . FLUOROSCOPY GUIDANCE    . HIP ARTHROPLASTY Left   . JOINT REPLACEMENT Left    05/2014  . LAPAROSCOPIC PARTIAL COLECTOMY  06/26/2017  . MOHS SURGERY  2019   nose  . PARTIAL COLECTOMY  06/2017  . REMOVAL OF BILATERAL TISSUE EXPANDERS WITH PLACEMENT OF BILATERAL BREAST IMPLANTS  2004  . THIGH LIFT  1994  . TOE FUSION Right    great toe  . TONSILLECTOMY    . UMBILICAL HERNIA REPAIR     with tummy tuck  . UMBILICAL HERNIA REPAIR  2002    MEDICATIONS: . albuterol (VENTOLIN HFA) 108 (90 Base) MCG/ACT inhaler  . Biotin 1000 MCG tablet  . Calcium Carb-Cholecalciferol (CALCIUM 600/VITAMIN D3) 600-800 MG-UNIT TABS  . cyclobenzaprine (FLEXERIL) 10 MG tablet  . dextromethorphan-guaiFENesin (MUCINEX DM) 30-600 MG 12hr tablet  . estrogen, conjugated,-medroxyprogesterone (PREMPRO) 0.45-1.5 MG tablet  .  fluticasone (FLONASE) 50 MCG/ACT nasal spray  . latanoprost (XALATAN) 0.005 % ophthalmic solution  . lisinopril-hydrochlorothiazide (PRINZIDE,ZESTORETIC) 20-25 MG tablet  . loratadine (CLARITIN) 10 MG tablet  . pantoprazole (PROTONIX) 40 MG tablet  . SUMAtriptan (IMITREX) 100 MG tablet   No current facility-administered medications for this encounter.     Myra Gianotti, PA-C Surgical Short Stay/Anesthesiology [facility] Phone [phone] [facility] Phone [phone] 12/28/2018 12:07 PM

## 2018-12-29 ENCOUNTER — Inpatient Hospital Stay (HOSPITAL_COMMUNITY): Payer: Medicare Other | Admitting: Certified Registered"

## 2018-12-29 ENCOUNTER — Inpatient Hospital Stay (HOSPITAL_COMMUNITY)
Admission: RE | Disposition: A | Discharge status: Home Health | Payer: Self-pay | Source: Home / Self Care | Attending: Neurosurgery

## 2018-12-29 ENCOUNTER — Inpatient Hospital Stay (HOSPITAL_COMMUNITY): Payer: Medicare Other

## 2018-12-29 ENCOUNTER — Inpatient Hospital Stay (HOSPITAL_COMMUNITY)
Admission: RE | Admit: 2018-12-29 | Discharge: 2019-01-05 | DRG: 460 | Disposition: A | Discharge status: Home Health | Payer: Medicare Other | Attending: Neurosurgery | Admitting: Neurosurgery

## 2018-12-29 ENCOUNTER — Encounter (HOSPITAL_COMMUNITY): Payer: Self-pay | Admitting: Certified Registered"

## 2018-12-29 ENCOUNTER — Other Ambulatory Visit: Payer: Self-pay

## 2018-12-29 ENCOUNTER — Inpatient Hospital Stay (HOSPITAL_COMMUNITY): Payer: Medicare Other | Admitting: Vascular Surgery

## 2018-12-29 DIAGNOSIS — Z419 Encounter for procedure for purposes other than remedying health state, unspecified: Secondary | ICD-10-CM

## 2018-12-29 DIAGNOSIS — M5116 Intervertebral disc disorders with radiculopathy, lumbar region: Secondary | ICD-10-CM | POA: Diagnosis present

## 2018-12-29 DIAGNOSIS — M4156 Other secondary scoliosis, lumbar region: Secondary | ICD-10-CM

## 2018-12-29 DIAGNOSIS — D62 Acute posthemorrhagic anemia: Secondary | ICD-10-CM | POA: Diagnosis not present

## 2018-12-29 DIAGNOSIS — M4126 Other idiopathic scoliosis, lumbar region: Secondary | ICD-10-CM | POA: Diagnosis present

## 2018-12-29 DIAGNOSIS — M8580 Other specified disorders of bone density and structure, unspecified site: Secondary | ICD-10-CM | POA: Diagnosis present

## 2018-12-29 DIAGNOSIS — I10 Essential (primary) hypertension: Secondary | ICD-10-CM | POA: Diagnosis present

## 2018-12-29 DIAGNOSIS — Z79899 Other long term (current) drug therapy: Secondary | ICD-10-CM | POA: Diagnosis not present

## 2018-12-29 DIAGNOSIS — M48061 Spinal stenosis, lumbar region without neurogenic claudication: Secondary | ICD-10-CM | POA: Diagnosis present

## 2018-12-29 DIAGNOSIS — M5417 Radiculopathy, lumbosacral region: Secondary | ICD-10-CM | POA: Diagnosis not present

## 2018-12-29 DIAGNOSIS — Y848 Other medical procedures as the cause of abnormal reaction of the patient, or of later complication, without mention of misadventure at the time of the procedure: Secondary | ICD-10-CM | POA: Diagnosis not present

## 2018-12-29 DIAGNOSIS — M419 Scoliosis, unspecified: Secondary | ICD-10-CM | POA: Diagnosis present

## 2018-12-29 DIAGNOSIS — Z7951 Long term (current) use of inhaled steroids: Secondary | ICD-10-CM

## 2018-12-29 DIAGNOSIS — K219 Gastro-esophageal reflux disease without esophagitis: Secondary | ICD-10-CM | POA: Diagnosis present

## 2018-12-29 DIAGNOSIS — M4807 Spinal stenosis, lumbosacral region: Secondary | ICD-10-CM | POA: Diagnosis present

## 2018-12-29 DIAGNOSIS — M5117 Intervertebral disc disorders with radiculopathy, lumbosacral region: Secondary | ICD-10-CM | POA: Diagnosis present

## 2018-12-29 DIAGNOSIS — Z85828 Personal history of other malignant neoplasm of skin: Secondary | ICD-10-CM

## 2018-12-29 DIAGNOSIS — I471 Supraventricular tachycardia: Secondary | ICD-10-CM | POA: Diagnosis not present

## 2018-12-29 DIAGNOSIS — Z1159 Encounter for screening for other viral diseases: Secondary | ICD-10-CM

## 2018-12-29 DIAGNOSIS — M4316 Spondylolisthesis, lumbar region: Secondary | ICD-10-CM | POA: Diagnosis present

## 2018-12-29 DIAGNOSIS — E785 Hyperlipidemia, unspecified: Secondary | ICD-10-CM | POA: Diagnosis present

## 2018-12-29 DIAGNOSIS — Z96642 Presence of left artificial hip joint: Secondary | ICD-10-CM | POA: Diagnosis present

## 2018-12-29 DIAGNOSIS — M545 Low back pain: Secondary | ICD-10-CM | POA: Diagnosis present

## 2018-12-29 DIAGNOSIS — I9581 Postprocedural hypotension: Secondary | ICD-10-CM | POA: Diagnosis not present

## 2018-12-29 DIAGNOSIS — G9340 Encephalopathy, unspecified: Secondary | ICD-10-CM | POA: Diagnosis not present

## 2018-12-29 DIAGNOSIS — M4317 Spondylolisthesis, lumbosacral region: Secondary | ICD-10-CM | POA: Diagnosis present

## 2018-12-29 DIAGNOSIS — T8110XA Postprocedural shock unspecified, initial encounter: Secondary | ICD-10-CM | POA: Diagnosis not present

## 2018-12-29 DIAGNOSIS — Z9049 Acquired absence of other specified parts of digestive tract: Secondary | ICD-10-CM | POA: Diagnosis not present

## 2018-12-29 HISTORY — PX: ANTERIOR LATERAL LUMBAR FUSION WITH PERCUTANEOUS SCREW 3 LEVEL: SHX5555

## 2018-12-29 HISTORY — PX: ANTERIOR LUMBAR FUSION: SHX1170

## 2018-12-29 HISTORY — PX: LUMBAR PERCUTANEOUS PEDICLE SCREW 4 LEVEL: SHX6318

## 2018-12-29 HISTORY — PX: ABDOMINAL EXPOSURE: SHX5708

## 2018-12-29 LAB — POCT I-STAT 7, (LYTES, BLD GAS, ICA,H+H)
Acid-Base Excess: 3 mmol/L — ABNORMAL HIGH (ref 0.0–2.0)
Acid-Base Excess: 1 mmol/L (ref 0.0–2.0)
Bicarbonate: 27.8 mmol/L (ref 20.0–28.0)
Bicarbonate: 25.7 mmol/L (ref 20.0–28.0)
Calcium, Ion: 1.16 mmol/L (ref 1.15–1.40)
Calcium, Ion: 1.15 mmol/L (ref 1.15–1.40)
HCT: 32 % — ABNORMAL LOW (ref 36.0–46.0)
HCT: 25 % — ABNORMAL LOW (ref 36.0–46.0)
Hemoglobin: 10.9 g/dL — ABNORMAL LOW (ref 12.0–15.0)
Hemoglobin: 8.5 g/dL — ABNORMAL LOW (ref 12.0–15.0)
O2 Saturation: 100 %
O2 Saturation: 100 %
Potassium: 3.6 mmol/L (ref 3.5–5.1)
Potassium: 3.4 mmol/L — ABNORMAL LOW (ref 3.5–5.1)
Sodium: 132 mmol/L — ABNORMAL LOW (ref 135–145)
Sodium: 133 mmol/L — ABNORMAL LOW (ref 135–145)
TCO2: 29 mmol/L (ref 22–32)
TCO2: 27 mmol/L (ref 22–32)
pCO2 arterial: 42.2 mmHg (ref 32.0–48.0)
pCO2 arterial: 38.8 mmHg (ref 32.0–48.0)
pH, Arterial: 7.427 (ref 7.350–7.450)
pH, Arterial: 7.428 (ref 7.350–7.450)
pO2, Arterial: 316 mmHg — ABNORMAL HIGH (ref 83.0–108.0)
pO2, Arterial: 177 mmHg — ABNORMAL HIGH (ref 83.0–108.0)

## 2018-12-29 IMAGING — CR OR LOCAL ABDOMEN
1 series · 1 of 1 positions shown · non-contrast
Comparison: None.

CLINICAL DATA: Status post anterior lumbar interbody fusion.
Evaluate for surgical instrument.

EXAM:
OR LOCAL ABDOMEN

[AP]
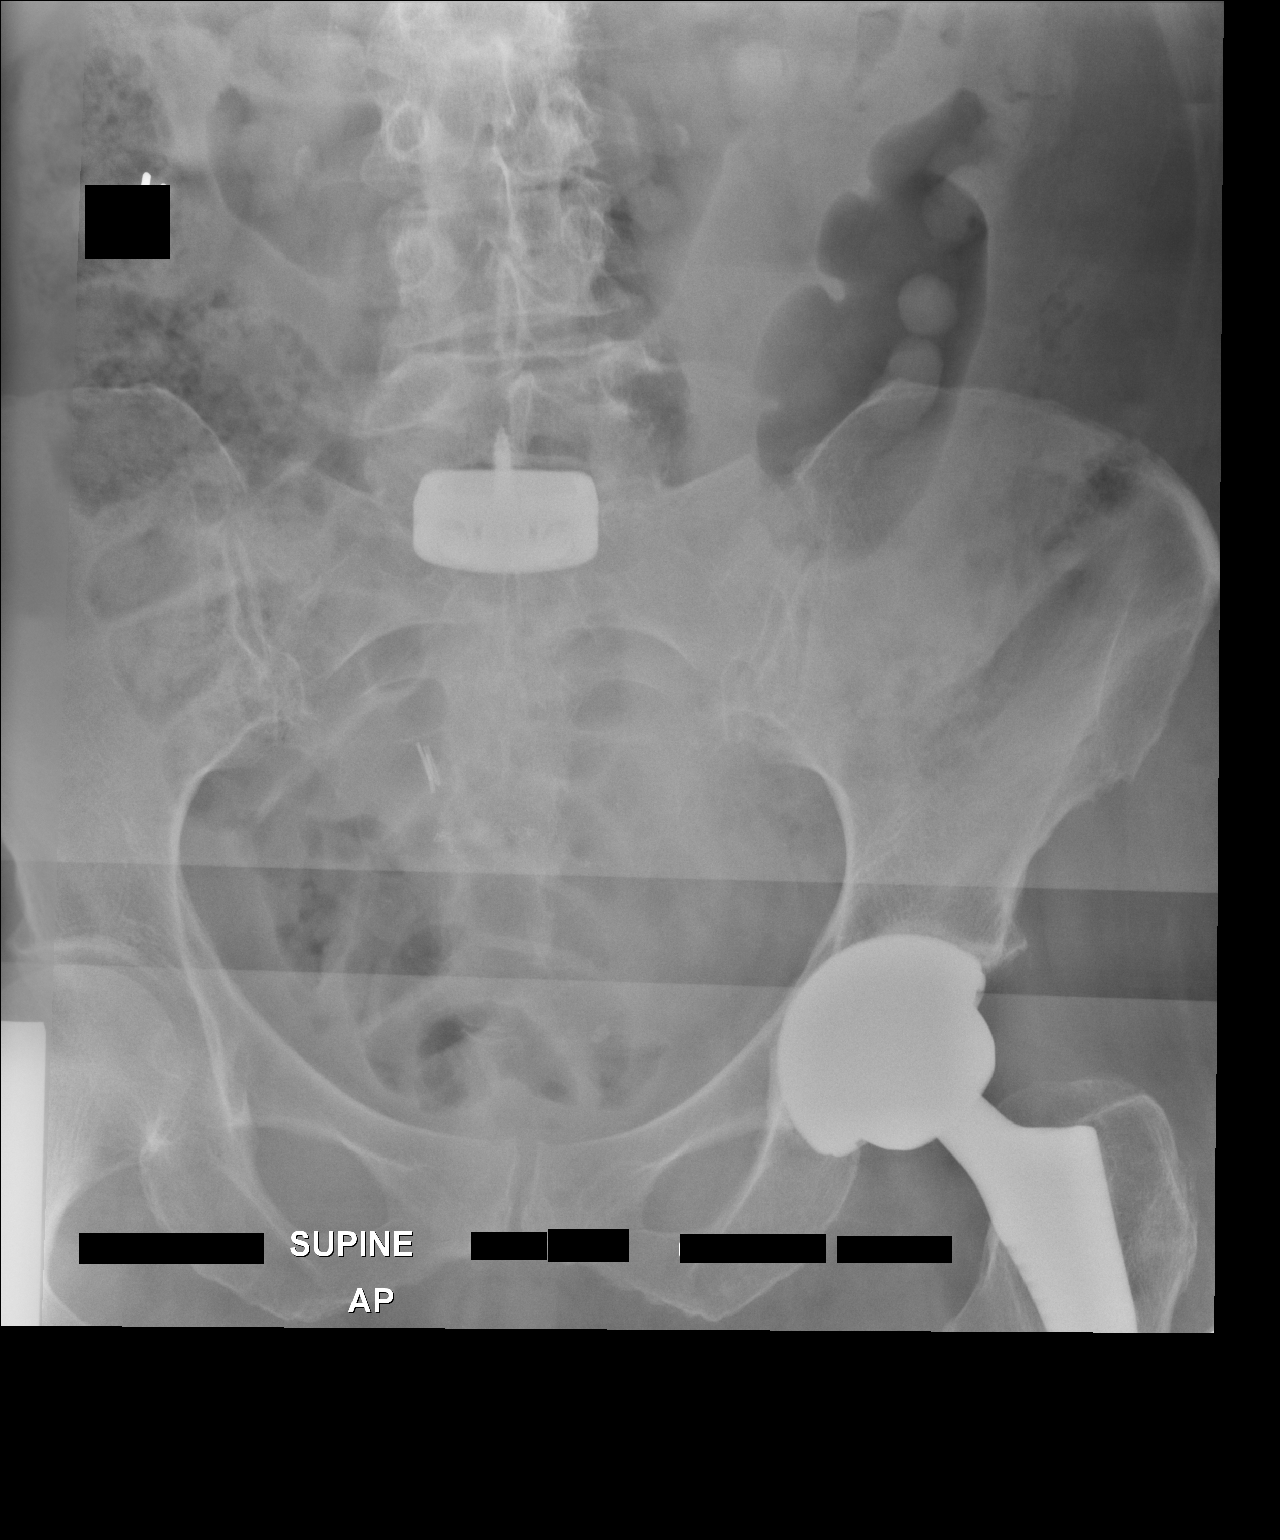

[1 of 1 positions shown; findings below may reference images not displayed]

FINDINGS: There is interval anterior lumbar interbody fusion. There is no
retained radiopaque surgical instrument. There is no bowel
dilatation to suggest obstruction. There is no evidence of
pneumoperitoneum, portal venous gas or pneumatosis.

There are no pathologic calcifications along the expected course of
the ureters.

There is a left hip arthroplasty.
IMPRESSION: There is interval anterior lumbar interbody fusion. There is no
retained radiopaque surgical instrument.

These results were called by telephone at the time of interpretation
acknowledged these results.

## 2018-12-29 IMAGING — RF LUMBAR SPINE - COMPLETE 4+ VIEW
1 series · 9 of 9 positions shown · non-contrast
Comparison: None.

CLINICAL DATA: Back pain

EXAM:
LUMBAR SPINE - COMPLETE 4+ VIEW

[Series 1: run · 9 of 9 slices shown]
[im 1/9]
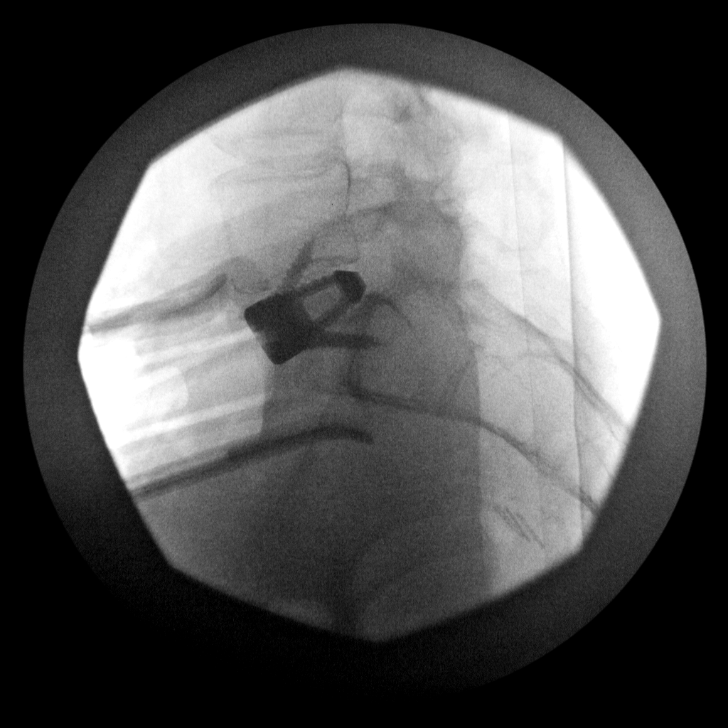
[im 2/9]
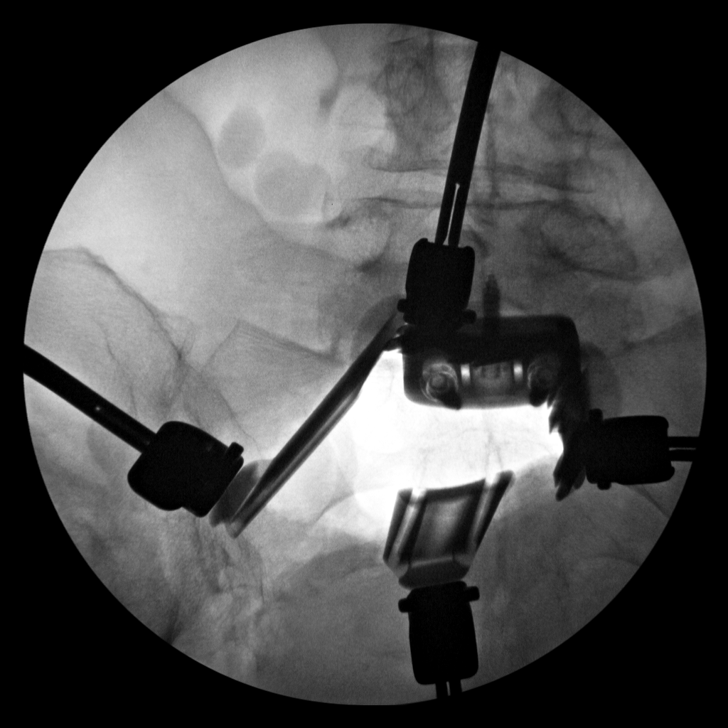
[im 3/9]
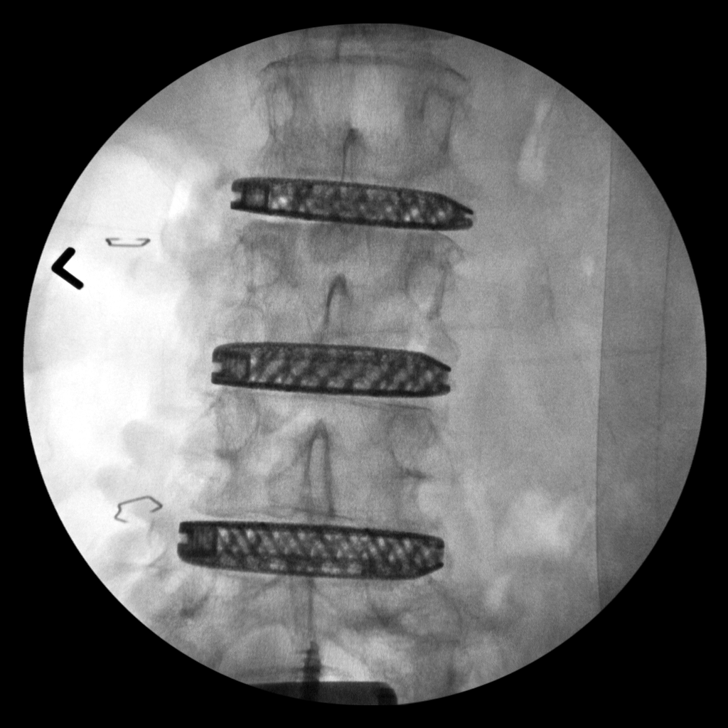
[im 4/9]
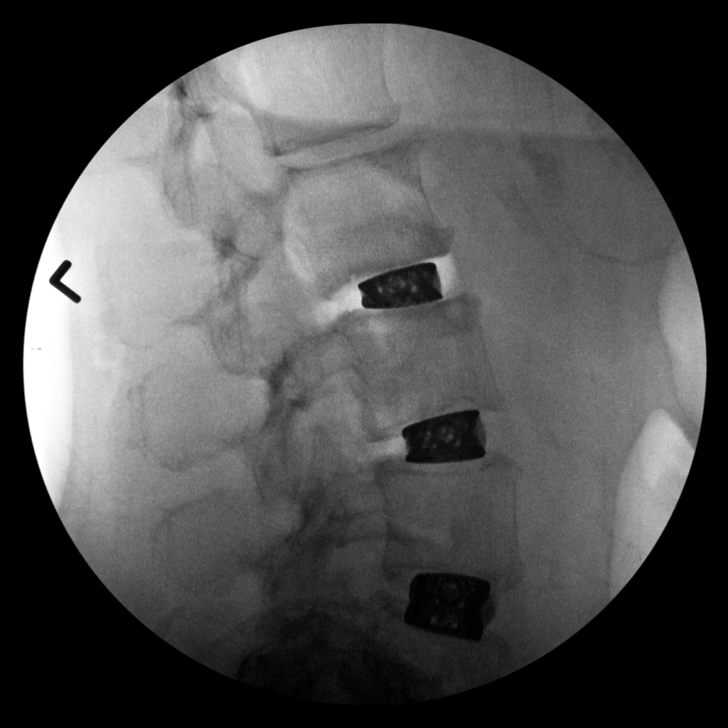
[im 5/9]
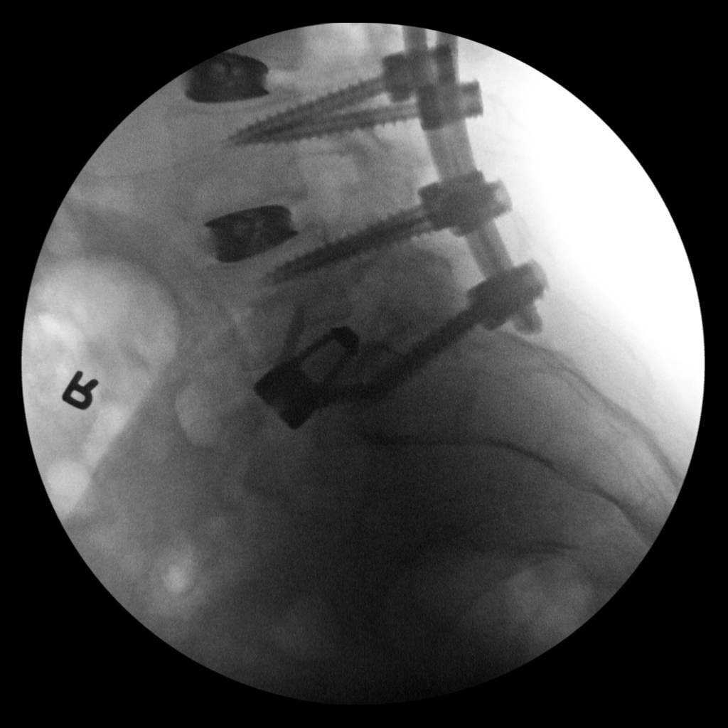
[im 6/9]
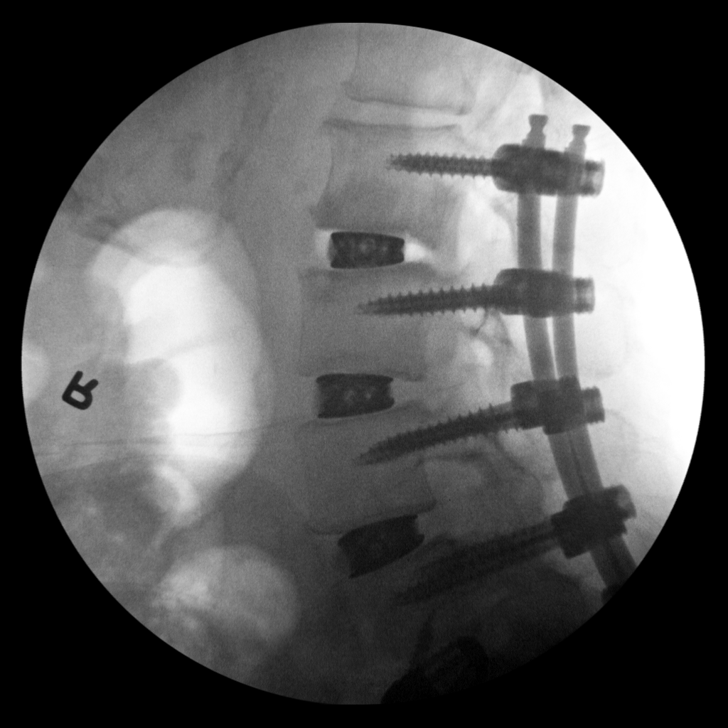
[im 7/9]
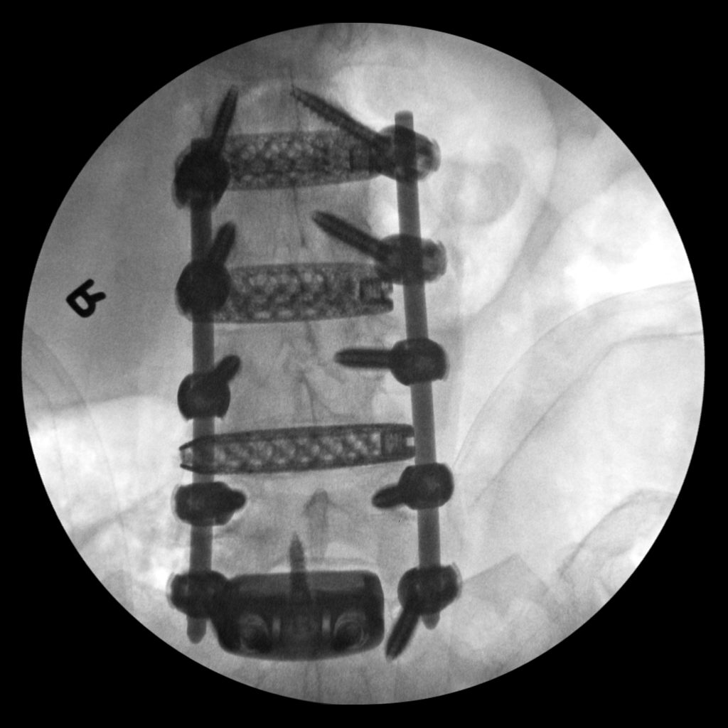
[im 8/9]
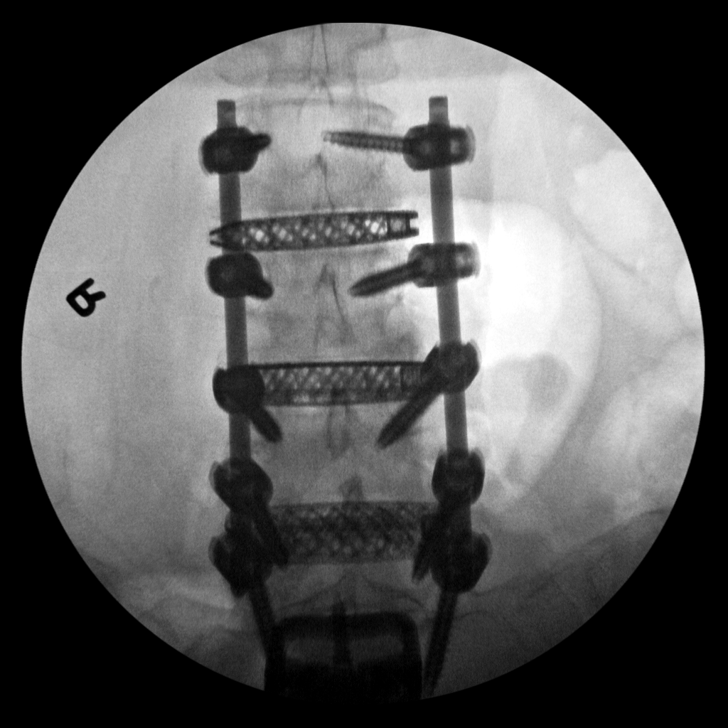
[im 9/9]
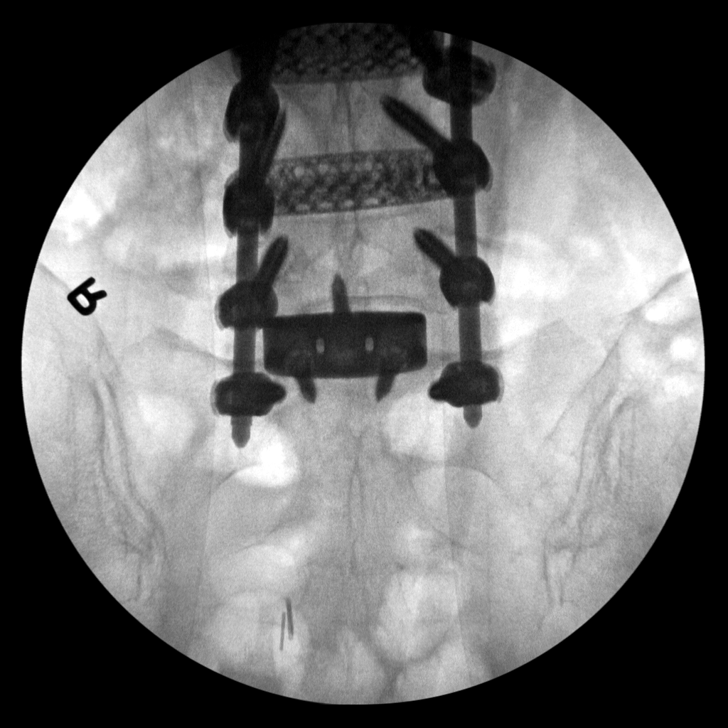

[9 of 9 positions shown; findings below may reference images not displayed]

FINDINGS: The patient has undergone lumbar fusion from L2 through S1. The
hardware appears intact. There are multilevel interbody spacers. A
total of 9 images were submitted. The fluoroscopy time was 6 minutes
and 51 seconds.
IMPRESSION: Status post lumbar fusion as above.

## 2018-12-29 SURGERY — ANTERIOR LUMBAR FUSION 1 LEVEL
Anesthesia: General | Site: Spine Lumbar

## 2018-12-29 MED ORDER — PHENYLEPHRINE HCL-NACL 10-0.9 MG/250ML-% IV SOLN
0.0000 ug/min | INTRAVENOUS | Status: DC
  Administered 2018-12-29: 25 ug/min via INTRAVENOUS
  Administered 2018-12-29: 20 ug/min via INTRAVENOUS

## 2018-12-29 MED ORDER — LIDOCAINE-EPINEPHRINE 1 %-1:100000 IJ SOLN
INTRAMUSCULAR | Status: DC | PRN
  Administered 2018-12-29: 7.5 mL
  Administered 2018-12-29: 6.5 mL

## 2018-12-29 MED ORDER — ONDANSETRON HCL 4 MG PO TABS: 4.0000 mg | ORAL_TABLET | Freq: Four times a day (QID) | ORAL | Status: DC | PRN

## 2018-12-29 MED ORDER — MENTHOL 3 MG MT LOZG: 1.0000 | LOZENGE | OROMUCOSAL | Status: DC | PRN

## 2018-12-29 MED ORDER — LACTATED RINGERS IV SOLN
INTRAVENOUS | Status: DC | PRN
  Administered 2018-12-29: 08:00:00 via INTRAVENOUS

## 2018-12-29 MED ORDER — SODIUM CHLORIDE 0.9 % IV SOLN
0.1000 mg/kg/h | INTRAVENOUS | Status: DC
  Administered 2018-12-29: .6 mg/kg/h via INTRAVENOUS
  Filled 2018-12-29: qty 5

## 2018-12-29 MED ORDER — ALBUMIN HUMAN 5 % IV SOLN
INTRAVENOUS | Status: DC | PRN
  Administered 2018-12-29 (×2): via INTRAVENOUS

## 2018-12-29 MED ORDER — ALBUTEROL SULFATE HFA 108 (90 BASE) MCG/ACT IN AERS: 2.0000 | INHALATION_SPRAY | Freq: Four times a day (QID) | RESPIRATORY_TRACT | Status: DC | PRN

## 2018-12-29 MED ORDER — FLUTICASONE PROPIONATE 50 MCG/ACT NA SUSP
1.0000 | Freq: Every day | NASAL | Status: DC
  Administered 2019-01-02 – 2019-01-05 (×4): 1 via NASAL
  Filled 2018-12-29: qty 16

## 2018-12-29 MED ORDER — FENTANYL CITRATE (PF) 250 MCG/5ML IJ SOLN
INTRAMUSCULAR | Status: DC | PRN
  Administered 2018-12-29: 50 ug via INTRAVENOUS
  Administered 2018-12-29: 100 ug via INTRAVENOUS
  Administered 2018-12-29 (×4): 50 ug via INTRAVENOUS

## 2018-12-29 MED ORDER — THROMBIN 5000 UNITS EX SOLR
CUTANEOUS | Status: DC | PRN
  Administered 2018-12-29 (×2): 5000 [IU] via TOPICAL

## 2018-12-29 MED ORDER — METHOCARBAMOL 500 MG PO TABS
500.0000 mg | ORAL_TABLET | Freq: Four times a day (QID) | ORAL | Status: DC | PRN
  Administered 2018-12-30 – 2019-01-03 (×7): 500 mg via ORAL
  Filled 2018-12-29 (×7): qty 1

## 2018-12-29 MED ORDER — MIDAZOLAM HCL 5 MG/5ML IJ SOLN
INTRAMUSCULAR | Status: DC | PRN
  Administered 2018-12-29: 2 mg via INTRAVENOUS

## 2018-12-29 MED ORDER — ESTROGENS CONJUGATED 0.45 MG PO TABS
0.4500 mg | ORAL_TABLET | Freq: Every day | ORAL | Status: DC
  Administered 2018-12-30 – 2019-01-05 (×7): 0.45 mg via ORAL
  Filled 2018-12-29 (×7): qty 1

## 2018-12-29 MED ORDER — THROMBIN 5000 UNITS EX SOLR
CUTANEOUS | Status: AC
  Filled 2018-12-29: qty 10000

## 2018-12-29 MED ORDER — MEDROXYPROGESTERONE ACETATE 2.5 MG PO TABS
1.2500 mg | ORAL_TABLET | Freq: Every day | ORAL | Status: DC
  Administered 2018-12-31 – 2019-01-05 (×6): 1.25 mg via ORAL
  Filled 2018-12-29 (×7): qty 0.5

## 2018-12-29 MED ORDER — VANCOMYCIN HCL IN DEXTROSE 1-5 GM/200ML-% IV SOLN
1000.0000 mg | Freq: Once | INTRAVENOUS | Status: AC
  Administered 2018-12-29: 1000 mg via INTRAVENOUS
  Filled 2018-12-29: qty 200

## 2018-12-29 MED ORDER — DEXAMETHASONE SODIUM PHOSPHATE 10 MG/ML IJ SOLN
INTRAMUSCULAR | Status: DC | PRN
  Administered 2018-12-29: 5 mg via INTRAVENOUS

## 2018-12-29 MED ORDER — ALBUTEROL SULFATE (2.5 MG/3ML) 0.083% IN NEBU: 2.5000 mg | INHALATION_SOLUTION | Freq: Four times a day (QID) | RESPIRATORY_TRACT | Status: DC | PRN

## 2018-12-29 MED ORDER — ONDANSETRON HCL 4 MG/2ML IJ SOLN
4.0000 mg | Freq: Four times a day (QID) | INTRAMUSCULAR | Status: DC | PRN
  Administered 2018-12-30: 4 mg via INTRAVENOUS
  Filled 2018-12-29: qty 2

## 2018-12-29 MED ORDER — SODIUM CHLORIDE 0.9 % IV SOLN: 250.0000 mL | INTRAVENOUS | Status: DC

## 2018-12-29 MED ORDER — ACETAMINOPHEN 650 MG RE SUPP: 650.0000 mg | RECTAL | Status: DC | PRN

## 2018-12-29 MED ORDER — HYDROMORPHONE HCL 1 MG/ML IJ SOLN
INTRAMUSCULAR | Status: AC
  Filled 2018-12-29: qty 1

## 2018-12-29 MED ORDER — MIDAZOLAM HCL 2 MG/2ML IJ SOLN
INTRAMUSCULAR | Status: AC
  Filled 2018-12-29: qty 2

## 2018-12-29 MED ORDER — ONDANSETRON HCL 4 MG/2ML IJ SOLN
INTRAMUSCULAR | Status: DC | PRN
  Administered 2018-12-29: 4 mg via INTRAVENOUS

## 2018-12-29 MED ORDER — ZOLPIDEM TARTRATE 5 MG PO TABS: 5.0000 mg | ORAL_TABLET | Freq: Every evening | ORAL | Status: DC | PRN

## 2018-12-29 MED ORDER — LIDOCAINE-EPINEPHRINE 1 %-1:100000 IJ SOLN
INTRAMUSCULAR | Status: AC
  Filled 2018-12-29: qty 1

## 2018-12-29 MED ORDER — VANCOMYCIN HCL IN DEXTROSE 1-5 GM/200ML-% IV SOLN
1000.0000 mg | Freq: Once | INTRAVENOUS | Status: AC
  Administered 2018-12-29: 1000 mg via INTRAVENOUS

## 2018-12-29 MED ORDER — SODIUM CHLORIDE 0.9 % IV SOLN
INTRAVENOUS | Status: DC | PRN
  Administered 2018-12-29: 50 ug/min via INTRAVENOUS
  Administered 2018-12-29: 08:00:00 40 ug/min via INTRAVENOUS

## 2018-12-29 MED ORDER — THROMBIN 5000 UNITS EX SOLR
OROMUCOSAL | Status: DC | PRN
  Administered 2018-12-29 (×2): 5 mL via TOPICAL

## 2018-12-29 MED ORDER — CALCIUM CARB-CHOLECALCIFEROL 600-800 MG-UNIT PO TABS: 1.0000 | ORAL_TABLET | Freq: Every day | ORAL | Status: DC

## 2018-12-29 MED ORDER — PHENYLEPHRINE 40 MCG/ML (10ML) SYRINGE FOR IV PUSH (FOR BLOOD PRESSURE SUPPORT)
PREFILLED_SYRINGE | INTRAVENOUS | Status: DC | PRN
  Administered 2018-12-29 (×3): 120 ug via INTRAVENOUS
  Administered 2018-12-29 (×5): 80 ug via INTRAVENOUS
  Administered 2018-12-29: 40 ug via INTRAVENOUS
  Administered 2018-12-29: 80 ug via INTRAVENOUS
  Administered 2018-12-29: 20 ug via INTRAVENOUS
  Administered 2018-12-29: 80 ug via INTRAVENOUS

## 2018-12-29 MED ORDER — PANTOPRAZOLE SODIUM 40 MG IV SOLR: 40.0000 mg | Freq: Every day | INTRAVENOUS | Status: DC

## 2018-12-29 MED ORDER — THROMBIN 5000 UNITS EX SOLR
CUTANEOUS | Status: AC
  Filled 2018-12-29: qty 5000

## 2018-12-29 MED ORDER — BIOTIN 1000 MCG PO TABS: 1000.0000 ug | ORAL_TABLET | Freq: Every day | ORAL | Status: DC

## 2018-12-29 MED ORDER — ROCURONIUM BROMIDE 10 MG/ML (PF) SYRINGE
PREFILLED_SYRINGE | INTRAVENOUS | Status: DC | PRN
  Administered 2018-12-29 (×2): 10 mg via INTRAVENOUS
  Administered 2018-12-29: 15 mg via INTRAVENOUS
  Administered 2018-12-29: 30 mg via INTRAVENOUS

## 2018-12-29 MED ORDER — EPHEDRINE SULFATE-NACL 50-0.9 MG/10ML-% IV SOSY
PREFILLED_SYRINGE | INTRAVENOUS | Status: DC | PRN
  Administered 2018-12-29 (×3): 10 mg via INTRAVENOUS

## 2018-12-29 MED ORDER — HYDROCHLOROTHIAZIDE 25 MG PO TABS: 25.0000 mg | ORAL_TABLET | Freq: Every day | ORAL | Status: DC

## 2018-12-29 MED ORDER — 0.9 % SODIUM CHLORIDE (POUR BTL) OPTIME
TOPICAL | Status: DC | PRN
  Administered 2018-12-29: 1000 mL

## 2018-12-29 MED ORDER — SODIUM CHLORIDE 0.9% FLUSH: 3.0000 mL | INTRAVENOUS | Status: DC | PRN

## 2018-12-29 MED ORDER — SODIUM CHLORIDE 0.9% FLUSH: 3.0000 mL | Freq: Two times a day (BID) | INTRAVENOUS | Status: DC

## 2018-12-29 MED ORDER — PANTOPRAZOLE SODIUM 40 MG PO TBEC
40.0000 mg | DELAYED_RELEASE_TABLET | Freq: Every day | ORAL | Status: DC
  Administered 2018-12-30 – 2019-01-05 (×7): 40 mg via ORAL
  Filled 2018-12-29 (×6): qty 1

## 2018-12-29 MED ORDER — HYDROCODONE-ACETAMINOPHEN 5-325 MG PO TABS
2.0000 | ORAL_TABLET | ORAL | Status: DC | PRN
  Administered 2018-12-29 – 2019-01-05 (×26): 2 via ORAL
  Filled 2018-12-29 (×26): qty 2

## 2018-12-29 MED ORDER — POLYETHYLENE GLYCOL 3350 17 G PO PACK
17.0000 g | PACK | Freq: Every day | ORAL | Status: DC | PRN
  Administered 2019-01-01 – 2019-01-03 (×2): 17 g via ORAL
  Filled 2018-12-29 (×2): qty 1

## 2018-12-29 MED ORDER — METHOCARBAMOL 1000 MG/10ML IJ SOLN
500.0000 mg | Freq: Four times a day (QID) | INTRAVENOUS | Status: DC | PRN
  Administered 2018-12-29: 16:00:00 500 mg via INTRAVENOUS
  Filled 2018-12-29: qty 5

## 2018-12-29 MED ORDER — PHENOL 1.4 % MT LIQD: 1.0000 | OROMUCOSAL | Status: DC | PRN

## 2018-12-29 MED ORDER — ACETAMINOPHEN 325 MG PO TABS
650.0000 mg | ORAL_TABLET | ORAL | Status: DC | PRN
  Administered 2018-12-30: 650 mg via ORAL
  Filled 2018-12-29: qty 2

## 2018-12-29 MED ORDER — LISINOPRIL-HYDROCHLOROTHIAZIDE 20-25 MG PO TABS: 1.0000 | ORAL_TABLET | Freq: Every day | ORAL | Status: DC

## 2018-12-29 MED ORDER — SODIUM CHLORIDE 0.9 % IV SOLN: 10.0000 ug/kg/min | INTRAVENOUS | Status: DC

## 2018-12-29 MED ORDER — VANCOMYCIN HCL IN DEXTROSE 1-5 GM/200ML-% IV SOLN
INTRAVENOUS | Status: AC
  Filled 2018-12-29: qty 200

## 2018-12-29 MED ORDER — ALBUMIN HUMAN 5 % IV SOLN
12.5000 g | Freq: Once | INTRAVENOUS | Status: AC
  Administered 2018-12-29: 12.5 g via INTRAVENOUS

## 2018-12-29 MED ORDER — SUCCINYLCHOLINE CHLORIDE 200 MG/10ML IV SOSY
PREFILLED_SYRINGE | INTRAVENOUS | Status: DC | PRN
  Administered 2018-12-29: 80 mg via INTRAVENOUS

## 2018-12-29 MED ORDER — LACTATED RINGERS IV SOLN
INTRAVENOUS | Status: DC | PRN
  Administered 2018-12-29 (×2): via INTRAVENOUS

## 2018-12-29 MED ORDER — CYCLOBENZAPRINE HCL 10 MG PO TABS
10.0000 mg | ORAL_TABLET | Freq: Three times a day (TID) | ORAL | Status: DC | PRN
  Administered 2018-12-29 – 2019-01-05 (×14): 10 mg via ORAL
  Filled 2018-12-29 (×15): qty 1

## 2018-12-29 MED ORDER — ALUM & MAG HYDROXIDE-SIMETH 200-200-20 MG/5ML PO SUSP: 30.0000 mL | Freq: Four times a day (QID) | ORAL | Status: DC | PRN

## 2018-12-29 MED ORDER — ALBUMIN HUMAN 5 % IV SOLN
INTRAVENOUS | Status: AC
  Filled 2018-12-29: qty 250

## 2018-12-29 MED ORDER — HYDROMORPHONE HCL 1 MG/ML IJ SOLN
0.2500 mg | INTRAMUSCULAR | Status: AC | PRN
  Administered 2018-12-29 (×8): 0.25 mg via INTRAVENOUS

## 2018-12-29 MED ORDER — DM-GUAIFENESIN ER 30-600 MG PO TB12
1.0000 | ORAL_TABLET | Freq: Every day | ORAL | Status: DC
  Filled 2018-12-29 (×7): qty 1

## 2018-12-29 MED ORDER — PROPOFOL 10 MG/ML IV BOLUS
INTRAVENOUS | Status: AC
  Filled 2018-12-29: qty 20

## 2018-12-29 MED ORDER — PROPOFOL 10 MG/ML IV BOLUS
INTRAVENOUS | Status: DC | PRN
  Administered 2018-12-29: 110 mg via INTRAVENOUS

## 2018-12-29 MED ORDER — CALCIUM CARBONATE-VITAMIN D 500-200 MG-UNIT PO TABS
1.0000 | ORAL_TABLET | Freq: Every day | ORAL | Status: DC
  Administered 2018-12-30 – 2019-01-05 (×7): 1 via ORAL
  Filled 2018-12-29 (×6): qty 1

## 2018-12-29 MED ORDER — LISINOPRIL 20 MG PO TABS: 20.0000 mg | ORAL_TABLET | Freq: Every day | ORAL | Status: DC

## 2018-12-29 MED ORDER — FENTANYL CITRATE (PF) 250 MCG/5ML IJ SOLN
INTRAMUSCULAR | Status: AC
  Filled 2018-12-29: qty 5

## 2018-12-29 MED ORDER — BUPIVACAINE HCL (PF) 0.5 % IJ SOLN
INTRAMUSCULAR | Status: DC | PRN
  Administered 2018-12-29: 6.5 mL
  Administered 2018-12-29: 7.5 mL

## 2018-12-29 MED ORDER — SUGAMMADEX SODIUM 200 MG/2ML IV SOLN
INTRAVENOUS | Status: DC | PRN
  Administered 2018-12-29: 120 mg via INTRAVENOUS

## 2018-12-29 MED ORDER — LATANOPROST 0.005 % OP SOLN
1.0000 [drp] | Freq: Every day | OPHTHALMIC | Status: DC
  Administered 2018-12-29 – 2019-01-04 (×7): 1 [drp] via OPHTHALMIC
  Filled 2018-12-29: qty 2.5

## 2018-12-29 MED ORDER — BUPIVACAINE LIPOSOME 1.3 % IJ SUSP
20.0000 mL | INTRAMUSCULAR | Status: AC
  Administered 2018-12-29: 20 mL
  Filled 2018-12-29: qty 20

## 2018-12-29 MED ORDER — SUMATRIPTAN SUCCINATE 100 MG PO TABS
100.0000 mg | ORAL_TABLET | ORAL | Status: DC | PRN
  Filled 2018-12-29: qty 1

## 2018-12-29 MED ORDER — FLEET ENEMA 7-19 GM/118ML RE ENEM
1.0000 | ENEMA | Freq: Once | RECTAL | Status: AC | PRN
  Administered 2019-01-05: 1 via RECTAL
  Filled 2018-12-29: qty 1

## 2018-12-29 MED ORDER — HEMOSTATIC AGENTS (NO CHARGE) OPTIME
TOPICAL | Status: DC | PRN
  Administered 2018-12-29: 1 via TOPICAL

## 2018-12-29 MED ORDER — MORPHINE SULFATE (PF) 2 MG/ML IV SOLN
2.0000 mg | INTRAVENOUS | Status: DC | PRN
  Administered 2018-12-30 (×4): 2 mg via INTRAVENOUS
  Filled 2018-12-29 (×4): qty 1

## 2018-12-29 MED ORDER — BUPIVACAINE HCL (PF) 0.5 % IJ SOLN
INTRAMUSCULAR | Status: AC
  Filled 2018-12-29: qty 30

## 2018-12-29 MED ORDER — PROMETHAZINE HCL 25 MG/ML IJ SOLN: 6.2500 mg | INTRAMUSCULAR | Status: DC | PRN

## 2018-12-29 MED ORDER — CONJ ESTROG-MEDROXYPROGEST ACE 0.45-1.5 MG PO TABS: 1.0000 | ORAL_TABLET | Freq: Every day | ORAL | Status: DC

## 2018-12-29 MED ORDER — DOCUSATE SODIUM 100 MG PO CAPS
100.0000 mg | ORAL_CAPSULE | Freq: Two times a day (BID) | ORAL | Status: DC
  Administered 2018-12-29 – 2019-01-05 (×13): 100 mg via ORAL
  Filled 2018-12-29 (×12): qty 1

## 2018-12-29 MED ORDER — LIDOCAINE 2% (20 MG/ML) 5 ML SYRINGE
INTRAMUSCULAR | Status: DC | PRN
  Administered 2018-12-29: 60 mg via INTRAVENOUS

## 2018-12-29 MED ORDER — BISACODYL 10 MG RE SUPP
10.0000 mg | Freq: Every day | RECTAL | Status: DC | PRN
  Administered 2019-01-03: 09:00:00 10 mg via RECTAL
  Filled 2018-12-29: qty 1

## 2018-12-29 MED ORDER — OXYCODONE HCL 5 MG PO TABS
5.0000 mg | ORAL_TABLET | ORAL | Status: DC | PRN
  Administered 2018-12-29 – 2018-12-31 (×4): 5 mg via ORAL
  Filled 2018-12-29 (×3): qty 1

## 2018-12-29 MED ORDER — OXYCODONE HCL 5 MG PO TABS
ORAL_TABLET | ORAL | Status: AC
  Filled 2018-12-29: qty 1

## 2018-12-29 MED ORDER — KCL IN DEXTROSE-NACL 20-5-0.45 MEQ/L-%-% IV SOLN
INTRAVENOUS | Status: DC
  Administered 2018-12-29 – 2019-01-04 (×9): via INTRAVENOUS
  Filled 2018-12-29 (×12): qty 1000

## 2018-12-29 MED ORDER — LORATADINE 10 MG PO TABS
10.0000 mg | ORAL_TABLET | Freq: Every day | ORAL | Status: DC
  Administered 2018-12-30 – 2019-01-05 (×6): 10 mg via ORAL
  Filled 2018-12-29 (×6): qty 1

## 2018-12-29 SURGICAL SUPPLY — 117 items
APPLIER CLIP 11 MED OPEN (CLIP) ×6
BASE TI IMPLANT 8X38X28 15DEG (Neuro Prosthesis/Implant) ×2 IMPLANT
BASKET BONE COLLECTION (BASKET) IMPLANT
BOLT BASE TI 5X17.5 VARIABLE (Bolt) ×6 IMPLANT
BUR BARREL STRAIGHT FLUTE 4.0 (BURR) IMPLANT
CAGE MODULUS XL 10X18X50 - 10 (Cage) ×2 IMPLANT
CAGE MODULUS XL 10X18X55 - 10 (Cage) ×2 IMPLANT
CAGE MODULUS XL 8X18X50 - 10 (Cage) ×2 IMPLANT
CANISTER SUCT 3000ML PPV (MISCELLANEOUS) ×6 IMPLANT
CLIP APPLIE 11 MED OPEN (CLIP) ×8 IMPLANT
CLIP NEUROVISION LG (CLIP) ×2 IMPLANT
COVER BACK TABLE 60X90IN (DRAPES) ×10 IMPLANT
COVER WAND RF STERILE (DRAPES) ×8 IMPLANT
DECANTER SPIKE VIAL GLASS SM (MISCELLANEOUS) ×6 IMPLANT
DERMABOND ADVANCED (GAUZE/BANDAGES/DRESSINGS) ×12
DERMABOND ADVANCED .7 DNX12 (GAUZE/BANDAGES/DRESSINGS) ×8 IMPLANT
DRAPE C-ARM 42X72 X-RAY (DRAPES) ×10 IMPLANT
DRAPE C-ARMOR (DRAPES) ×10 IMPLANT
DRAPE INCISE IOBAN 66X45 STRL (DRAPES) ×4 IMPLANT
DRAPE LAPAROTOMY 100X72X124 (DRAPES) ×10 IMPLANT
DRSG OPSITE POSTOP 3X4 (GAUZE/BANDAGES/DRESSINGS) ×4 IMPLANT
DRSG OPSITE POSTOP 4X10 (GAUZE/BANDAGES/DRESSINGS) ×4 IMPLANT
DRSG OPSITE POSTOP 4X6 (GAUZE/BANDAGES/DRESSINGS) ×4 IMPLANT
DURAPREP 26ML APPLICATOR (WOUND CARE) ×10 IMPLANT
ELECT BLADE 4.0 EZ CLEAN MEGAD (MISCELLANEOUS)
ELECT REM PT RETURN 9FT ADLT (ELECTROSURGICAL) ×12
ELECTRODE BLDE 4.0 EZ CLN MEGD (MISCELLANEOUS) IMPLANT
ELECTRODE REM PT RTRN 9FT ADLT (ELECTROSURGICAL) ×4 IMPLANT
GAUZE 4X4 16PLY RFD (DISPOSABLE) ×4 IMPLANT
GAUZE SPONGE 4X4 12PLY STRL (GAUZE/BANDAGES/DRESSINGS) IMPLANT
GLOVE BIO SURGEON STRL SZ8 (GLOVE) ×12 IMPLANT
GLOVE BIOGEL PI IND STRL 7.0 (GLOVE) IMPLANT
GLOVE BIOGEL PI IND STRL 7.5 (GLOVE) IMPLANT
GLOVE BIOGEL PI IND STRL 8 (GLOVE) ×8 IMPLANT
GLOVE BIOGEL PI IND STRL 8.5 (GLOVE) ×4 IMPLANT
GLOVE BIOGEL PI INDICATOR 7.0 (GLOVE) ×4
GLOVE BIOGEL PI INDICATOR 7.5 (GLOVE) ×4
GLOVE BIOGEL PI INDICATOR 8 (GLOVE) ×10
GLOVE BIOGEL PI INDICATOR 8.5 (GLOVE) ×8
GLOVE ECLIPSE 7.5 STRL STRAW (GLOVE) ×14 IMPLANT
GLOVE ECLIPSE 8.0 STRL XLNG CF (GLOVE) ×10 IMPLANT
GLOVE ECLIPSE 9.0 STRL (GLOVE) ×2 IMPLANT
GLOVE EXAM NITRILE XL STR (GLOVE) IMPLANT
GLOVE SURG SS PI 7.0 STRL IVOR (GLOVE) ×10 IMPLANT
GOWN STRL REUS W/ TWL LRG LVL3 (GOWN DISPOSABLE) ×12 IMPLANT
GOWN STRL REUS W/ TWL XL LVL3 (GOWN DISPOSABLE) ×4 IMPLANT
GOWN STRL REUS W/TWL 2XL LVL3 (GOWN DISPOSABLE) ×12 IMPLANT
GOWN STRL REUS W/TWL LRG LVL3 (GOWN DISPOSABLE)
GOWN STRL REUS W/TWL XL LVL3 (GOWN DISPOSABLE) ×12
GUIDEWIRE NITINOL BEVEL TIP (WIRE) ×20 IMPLANT
HEMOSTAT POWDER SURGIFOAM 1G (HEMOSTASIS) ×4 IMPLANT
INSERT FOGARTY 61MM (MISCELLANEOUS) IMPLANT
INSERT FOGARTY SM (MISCELLANEOUS) IMPLANT
KIT BASIN OR (CUSTOM PROCEDURE TRAY) ×8 IMPLANT
KIT DILATOR XLIF 5 (KITS) IMPLANT
KIT INFUSE SMALL (Orthopedic Implant) ×2 IMPLANT
KIT INFUSE XX SMALL 0.7CC (Orthopedic Implant) ×2 IMPLANT
KIT SURGICAL ACCESS MAXCESS 4 (KITS) ×2 IMPLANT
KIT TURNOVER KIT B (KITS) ×10 IMPLANT
KIT XLIF (KITS) ×2
LOOP VESSEL MAXI BLUE (MISCELLANEOUS) IMPLANT
LOOP VESSEL MINI RED (MISCELLANEOUS) IMPLANT
MODULE NVM5 NEXT GEN EMG (NEEDLE) ×2 IMPLANT
NDL HYPO 21X1.5 SAFETY (NEEDLE) IMPLANT
NDL HYPO 25X1 1.5 SAFETY (NEEDLE) IMPLANT
NDL I PASS (NEEDLE) IMPLANT
NDL SPNL 18GX3.5 QUINCKE PK (NEEDLE) ×4 IMPLANT
NEEDLE HYPO 21X1.5 SAFETY (NEEDLE) ×6 IMPLANT
NEEDLE HYPO 25X1 1.5 SAFETY (NEEDLE) ×12 IMPLANT
NEEDLE I PASS (NEEDLE) ×6 IMPLANT
NEEDLE SPNL 18GX3.5 QUINCKE PK (NEEDLE) ×6 IMPLANT
NS IRRIG 1000ML POUR BTL (IV SOLUTION) ×6 IMPLANT
PACK LAMINECTOMY NEURO (CUSTOM PROCEDURE TRAY) ×10 IMPLANT
PAD ARMBOARD 7.5X6 YLW CONV (MISCELLANEOUS) ×22 IMPLANT
PUTTY BONE ATTRAX 10CC STRIP (Putty) ×4 IMPLANT
ROD RELINE MAS LORD 5.5X120MM (Rod) ×4 IMPLANT
SCREW LOCK RELINE 5.5 TULIP (Screw) ×20 IMPLANT
SCREW MAS RELINE 6.5X45 POLY (Screw) ×8 IMPLANT
SCREW MAS RELINE POLY 6.5X40 (Screw) ×8 IMPLANT
SCREW RELINE MAS POLY 5.5X35MM (Screw) ×2 IMPLANT
SCREW RELINE MAS POLY 5.5X40 (Screw) ×2 IMPLANT
SPONGE INTESTINAL PEANUT (DISPOSABLE) ×10 IMPLANT
SPONGE LAP 18X18 RF (DISPOSABLE) ×8 IMPLANT
SPONGE LAP 4X18 RFD (DISPOSABLE) IMPLANT
SPONGE SURGIFOAM ABS GEL SZ50 (HEMOSTASIS) ×6 IMPLANT
STAPLER VISISTAT (STAPLE) IMPLANT
STAPLER VISISTAT 35W (STAPLE) IMPLANT
SUT PDS AB 1 CTX 36 (SUTURE) ×6 IMPLANT
SUT PROLENE 4 0 RB 1 (SUTURE)
SUT PROLENE 4-0 RB1 .5 CRCL 36 (SUTURE) IMPLANT
SUT PROLENE 5 0 CC1 (SUTURE) IMPLANT
SUT PROLENE 6 0 C 1 30 (SUTURE) ×4 IMPLANT
SUT PROLENE 6 0 CC (SUTURE) IMPLANT
SUT SILK 0 TIES 10X30 (SUTURE) ×6 IMPLANT
SUT SILK 2 0 TIES 10X30 (SUTURE) ×6 IMPLANT
SUT SILK 2 0 TIES 17X18 (SUTURE)
SUT SILK 2 0SH CR/8 30 (SUTURE) IMPLANT
SUT SILK 2-0 18XBRD TIE BLK (SUTURE) ×4 IMPLANT
SUT SILK 3 0 TIES 10X30 (SUTURE) ×4 IMPLANT
SUT SILK 3 0SH CR/8 30 (SUTURE) IMPLANT
SUT VIC AB 0 CT1 27 (SUTURE)
SUT VIC AB 0 CT1 27XBRD ANBCTR (SUTURE) ×4 IMPLANT
SUT VIC AB 1 CT1 18XBRD ANBCTR (SUTURE) IMPLANT
SUT VIC AB 1 CT1 8-18 (SUTURE) ×6
SUT VIC AB 2-0 CT1 18 (SUTURE) ×10 IMPLANT
SUT VIC AB 2-0 CT1 36 (SUTURE) ×4 IMPLANT
SUT VIC AB 2-0 CTB1 (SUTURE) ×4 IMPLANT
SUT VIC AB 2-0 CTX 36 (SUTURE) IMPLANT
SUT VIC AB 3-0 SH 27 (SUTURE) ×2
SUT VIC AB 3-0 SH 27X BRD (SUTURE) ×8 IMPLANT
SUT VIC AB 3-0 SH 8-18 (SUTURE) ×18 IMPLANT
SUT VICRYL 4-0 PS2 18IN ABS (SUTURE) IMPLANT
SYR 20CC LL (SYRINGE) ×2 IMPLANT
TOWEL GREEN STERILE (TOWEL DISPOSABLE) ×12 IMPLANT
TOWEL GREEN STERILE FF (TOWEL DISPOSABLE) ×8 IMPLANT
TRAY FOLEY MTR SLVR 16FR STAT (SET/KITS/TRAYS/PACK) ×6 IMPLANT
WATER STERILE IRR 1000ML POUR (IV SOLUTION) ×6 IMPLANT

## 2018-12-29 NOTE — Op Note (Signed)
    NAME: [name]    MRN: [id number] DOB: 09/28/1950    DATE OF OPERATION: 12/29/2018  PREOP DIAGNOSIS:    DEGENERATIVE DISC DISEASE  POSTOP DIAGNOSIS:    Same  PROCEDURE:    Anterior retroperitoneal exposure of L5-S1  EXPOSURE SURGEON: Judeth Cornfield. Scot Dock, MD  SPINE SURGEON: Erline Levine, MD  ASSIST: Verdis Prime, nurse practitioner  ANESTHESIA: General  EBL: Minimal  INDICATIONS:    Amanda Conner is a 68 y.o. female who presents for anterior lumbar interbody fusion at L5-S1 we were asked to provide anterior retroperitoneal exposure.  FINDINGS:   Significant scar tissue in the retroperitoneum and anterior fascia related to previous lower abdominal surgery.  TECHNIQUE:   The patient was taken to the operating room and received a general anesthetic.  The level of the L5-S1 disc space was marked.  The abdomen was prepped and draped in usual sterile fashion.  A transverse incision was made to the left of the midline at the marked level and the dissection carried down to the anterior rectus fascia.  Of note the anatomy was unusual and there was no left rectus abdominis muscle identified.  We believe this was related to her previous 2 abdominal operations and tummy tuck.  However I expose the retroperitoneal space laterally after dividing the anterior rectus fascia and also had to dissect the transverse abdominis muscle off of the transversalis fascia to get into the retroperitoneal space.  Because of previous colon surgery there was also scarring within the retroperitoneum and initially I was a lateral but ultimately got medial to the left external iliac artery, internal iliac artery and subsequently the common iliac artery.  The dissection was carried up to the confluence of the iliac veins.  Using blunt dissection the L5-S1 disc space was exposed and using the retractor system the retractor blades were placed to allow adequate exposure.  Confirmation was obtained  that this was at the correct level after needle was placed.  The remainder of the dictation is as per Dr. Vertell Limber.  [name], MD, FACS [facility]  DATE OF DICTATION:   12/29/2018

## 2018-12-29 NOTE — Brief Op Note (Signed)
12/29/2018  2:47 PM  PATIENT:  Amanda Conner  68 y.o. female  PRE-OPERATIVE DIAGNOSIS:  Spinal stenosis of lumbosacral region, lumbar spondylolisthesis, lumbar scoliosis, lumbago, radiculopathy L 2 - S 1 levels  POST-OPERATIVE DIAGNOSIS:  Spinal stenosis of lumbosacral region, lumbar spondylolisthesis, lumbar scoliosis, lumbago, radiculopathy L 2 - S 1 levels   PROCEDURE:  Procedure(s) with comments: Lumbar Five Sacral One Anterior lumbar interbody fusion (N/A) - anterior approach Lumbar Two to Lumbar Five Anterolateral lumbar interbody fusion (Left) - anterolateral ABDOMINAL EXPOSURE (N/A) Lumbar Percutaneous Pedicle Screw Placement Lumbar two-Sacral one (N/A)  SURGEON:  Surgeon(s) and Role: Panel 1:    Erline Levine, MD - Primary    Earnie Larsson, MD - Assisting Panel 2:    * Angelia Mould, MD - Primary  PHYSICIAN ASSISTANT:   ASSISTANTS: [place], RN   ANESTHESIA:   general  EBL:  300 mL   BLOOD ADMINISTERED:none  DRAINS: none   LOCAL MEDICATIONS USED:  MARCAINE    and LIDOCAINE   SPECIMEN:  No Specimen  DISPOSITION OF SPECIMEN:  N/A  COUNTS:  YES  TOURNIQUET:  * No tourniquets in log *  DICTATION: DICTATION:   INDICATIONS:  Pateint is 68 year old female with chronic and intractable back and bilateral lower extremity pain, right greater than left with scoliosis and spondylolisthesis over multiple levels.   She has a lateral disc herniation and right foraminal stenosis at L 5 S 1.   It was elected to take her to surgery for anterior lumbar decompression and fusion at the L 23, L 34, L45, L 5 S1 levels.   PROCEDURE:  Doctor Scot Dock performed exposure and his portion of the procedure will be dictated separately.  Upon exposing the L 5 S1 level, a localizing X ray was obtained with the C arm.  I then incised the anterior annulus and performed a thorough discectomy with wide ligamentous releases.  The endplates were cleared of disc and cartilagenous  material and a thorough discectomy was performed with decompression of the ventral annulus and disc material.  After trials, a 15 degree, 8 x 38 x 28 Base titanium lordotic ALIF cage was placed and lagged with 2, 5.0 x 27.5 mm screws in S 1 and one in L 5. This was a  Titanium spacer which was packed with extra extra small BMP and Attrax.  The implant was tamped into position and positioning was confirmed with C arm.   Locking mechanisms were engaged, soft tissues were inspected and found to be in good repair.   Fascia was closed with 1 PDS running stitch, skin edges closed with 2-0 and 3-0 vicryl sutures.  Wound was dressed with a sterile occlusive dressing.    Patient was then  placed in a lateral decubitus position on the operative table and using orthogonally projected C-arm fluoroscopy the patient was placed so that the L 45 levels were visualized in AP and lateral plane. The patient was then taped into position.  Skin was marked along with a posterior finger dissection incision. Her flank was then prepped and draped in usual sterile fashion and incisions were made sequentially at L 45 level along the iliac crest and then at L3  working between the 11 th and 12 th ribs. Finger dissection was made to enter the retroperitoneal space and then subsequently the probe was inserted into the psoas muscle from the left side initially at the L45 level. After mapping the neural elements were able to dock  the probe per the midpoint of this vertebral level and without indications electrically of too close proximity to the neural tissues. Subsequently the self-retaining tractor was.after sequential dilators were utilized the shim was employed and the interspace was cleared of psoas muscle and then incised. A thorough discectomy was performed. Instruments were used to clear the interspace of disc material. After thorough discectomy was performed and this was performed using AP and lateral fluoroscopy a 10 lordotic by 55 x  18 mm implant was packed with small BMP and Attrax. This was tamped into position and its position was confirmed on AP and lateral fluoroscopy.  Hemostasis was assured the wounds were irrigated interrupted Vicryl sutures.Finger dissection was made to enter the retroperitoneal space and then subsequently the probe was inserted into the psoas muscle from the left side at the L34 level. After mapping the neural elements were able to dock the probe per the midpoint of this vertebral level and without indications electrically of too close proximity to the neural tissues. Subsequently the self-retaining tractor was.after sequential dilators were utilized the shim was employed and the interspace was cleared of psoas muscle and then incised. A thorough discectomy was performed. Instruments were used to clear the interspace of disc material. After thorough discectomy was performed and this was performed using AP and lateral fluoroscopy a 10 lordotic by 50 x 18 mm implant was packed in a similar fashion. This was tamped into position and its position was confirmed on AP and lateral fluoroscopy.  Hemostasis was assured Finger dissection was made to enter the retroperitoneal space and then subsequently the probe was inserted into the psoas muscle from the left side initially at the L23 level. After mapping the neural elements were able to dock the probe per the midpoint of this vertebral level and without indications electrically of too close proximity to the neural tissues. Subsequently the self-retaining tractor was.after sequential dilators were utilized the shim was employed and the interspace was cleared of psoas muscle and then incised. A thorough discectomy was performed. Instruments were used to clear the interspace of disc material. After thorough discectomy was performed and this was performed using AP and lateral fluoroscopy an 8 lordotc by 50 x 18 mm implant was packed with remaining BMP and Attrax. This was tamped  into position and its position was confirmed on AP and lateral fluoroscopy.  Hemostasis was assured at each level.  Incisions were closed with 0, 2-0, 3-0 vicryl sutures and dressed with Dermabond and an occlusive dressing.  The patient was then turned into a prone position on the [place] table on chest rolls and using AP and lateral fluoroscopy throughout this portion of the procedure, pedicle screws were placed using Nuvasive Reline cannulated percutaneous screws. After placing guide wires at each level with the use of nerve monitoring throughout, Nuvasive Reline towers were docked on the L2, L 3, L 4, L 5, S 1 levels.  Pedicle screws were placed bilaterally at L 2 (5.5 x 40 right and 5.5 x 35 left), 2 at L 3 (6.5 40, 2 at L 4 and  L 5 (6.5 x 45) and S1 (6.5 x 40 at each level). 120 mm rods were then affixed to the screw heads through a separate stab incision and locked down on the screws. All connections were then torqued and the Towers were disassembled. The wounds were irrigated and then closed with 1, 2-0 and 3-0 Vicryl stitches. Long-acting Marcaine was infiltrated in subcutaneous tissues.  Sterile occlusive dressing was  placed with Dermabond. The patient was then extubated in the operating room and taken to recovery in stable and satisfactory condition having tolerated his operation well. Counts were correct at the end of the case. Sterile occlusive dressing was placed with Dermabond. The patient was then extubated in the operating room and taken to recovery in stable and satisfactory condition having tolerated his operation well. Counts were correct at the end of the case.  Patient was extubated in the OR and taken to recovery having tolerated her surgery well.  Counts were correct.    Retractor Times: L 23 (14 mins); L 34 (28 mins); L 45 (17 mins).  Pelvic Parameters:  Preop:  PT 22 degrees; PI 53 degrees; LL -47; PI- LL +6 degree; SVA 72 mm;    PLAN OF CARE: Admit to inpatient   PATIENT  DISPOSITION:  PACU - hemodynamically stable.   Delay start of Pharmacological VTE agent (>24hrs) due to surgical blood loss or risk of bleeding: yes

## 2018-12-29 NOTE — Progress Notes (Signed)
Patient arrive to unit around 1830.  Patient is alert and oriented.  Arterial line is position and not accurate.  Patient arrive with cell phone, charger, clothing, brace and a suit case.  RN to continue to monitor.

## 2018-12-29 NOTE — Anesthesia Procedure Notes (Signed)
Procedure Name: Intubation Date/Time: 12/29/2018 8:48 AM Performed by: Imagene Riches, CRNA Pre-anesthesia Checklist: Patient identified, Emergency Drugs available, Suction available and Patient being monitored Patient Re-evaluated:Patient Re-evaluated prior to induction Oxygen Delivery Method: Circle System Utilized Preoxygenation: Pre-oxygenation with 100% oxygen Induction Type: IV induction Ventilation: Mask ventilation without difficulty Laryngoscope Size: Miller and 3 Grade View: Grade II Tube type: Oral Tube size: 7.0 mm Number of attempts: 1 Airway Equipment and Method: Stylet and Oral airway Placement Confirmation: ETT inserted through vocal cords under direct vision,  positive ETCO2 and breath sounds checked- equal and bilateral Secured at: 20 cm Tube secured with: Tape Dental Injury: Teeth and Oropharynx as per pre-operative assessment

## 2018-12-29 NOTE — Anesthesia Procedure Notes (Addendum)
Arterial Line Insertion Start/End6/16/2020 7:25 AM, 12/29/2018 7:30 AM Performed by: Suzette Battiest, MD, anesthesiologist  Patient location: Pre-op. Preanesthetic checklist: patient identified, IV checked, site marked, risks and benefits discussed, surgical consent, monitors and equipment checked, pre-op evaluation, timeout performed and anesthesia consent Lidocaine 1% used for infiltration Left, radial was placed Catheter size: 20 Fr Hand hygiene performed  and maximum sterile barriers used   Attempts: 2 Procedure performed without using ultrasound guided technique. Following insertion, dressing applied and Biopatch. Post procedure assessment: normal and unchanged  Patient tolerated the procedure well with no immediate complications.

## 2018-12-29 NOTE — Progress Notes (Signed)
Patient has had run of SVT and postop Hgb is 8.5.  She is having back pain, has full strength in both legs, abdomen is soft.  Plan is to admit to ICU overnight to observe and have CCM participate in her care.

## 2018-12-29 NOTE — Progress Notes (Signed)
Orthopedic Tech Progress Note Patient Details:  Amanda Conner [birth year] [id number] RN said patient has BACK BRACE.Marland Kitchen Patient ID: Amanda Conner, female   DOB: 29-Jan-1951, 68 y.o.   MRN: [id number]   Janit Pagan 12/29/2018, 6:39 PM

## 2018-12-29 NOTE — Interval H&P Note (Signed)
History and Physical Interval Note:  12/29/2018 7:18 AM  Amanda Conner  has presented today for surgery, with the diagnosis of Spinal stenosis of lumbosacral region.  The various methods of treatment have been discussed with the patient and family. After consideration of risks, benefits and other options for treatment, the patient has consented to  Procedure(s) with comments: Lumbar 5 Sacral 1 Anterior lumbar interbody fusion (N/A) - Lumbar 5 Sacral 1 Anterior lumbar interbody fusion Lumbar 2 to Lumbar 5 Anterolateral lumbar interbody fusion with percutaneous pedicle screw fixation (N/A) - Lumbar 2 to Lumbar 5 Anterolateral lumbar interbody fusion with percutaneous pedicle screw fixation ABDOMINAL EXPOSURE (N/A) as a surgical intervention.  The patient's history has been reviewed, patient examined, no change in status, stable for surgery.  I have reviewed the patient's chart and labs.  Questions were answered to the patient's satisfaction.     [name]

## 2018-12-29 NOTE — Interval H&P Note (Signed)
History and Physical Interval Note:  12/29/2018 7:02 AM  Amanda Conner  has presented today for surgery, with the diagnosis of Spinal stenosis of lumbosacral region.  The various methods of treatment have been discussed with the patient and family. After consideration of risks, benefits and other options for treatment, the patient has consented to  Procedure(s) with comments: Lumbar 5 Sacral 1 Anterior lumbar interbody fusion (N/A) - Lumbar 5 Sacral 1 Anterior lumbar interbody fusion Lumbar 2 to Lumbar 5 Anterolateral lumbar interbody fusion with percutaneous pedicle screw fixation (N/A) - Lumbar 2 to Lumbar 5 Anterolateral lumbar interbody fusion with percutaneous pedicle screw fixation ABDOMINAL EXPOSURE (N/A) as a surgical intervention.  The patient's history has been reviewed, patient examined, no change in status, stable for surgery.  I have reviewed the patient's chart and labs.  Questions were answered to the patient's satisfaction.     Peggyann Shoals

## 2018-12-29 NOTE — Transfer of Care (Addendum)
Immediate Anesthesia Transfer of Care Note  Patient: Amanda Conner  Procedure(s) Performed: Lumbar Five Sacral One Anterior lumbar interbody fusion (N/A Spine Lumbar) Lumbar Two to Lumbar Five Anterolateral lumbar interbody fusion (Left Spine Lumbar) Lumbar Percutaneous Pedicle Screw Placement Lumbar two-Sacral one (N/A Back) ABDOMINAL EXPOSURE (N/A Abdomen)  Patient Location: PACU  Anesthesia Type:General  Level of Consciousness: drowsy  Airway & Oxygen Therapy: Patient Spontanous Breathing and Patient connected to nasal cannula oxygen  Post-op Assessment: Report given to RN and Post -op Vital signs reviewed and stable  Post vital signs: Reviewed and stable  Last Vitals:  Vitals Value Taken Time  BP 111/57 12/29/18 1456  Temp    Pulse 88 12/29/18 1456  Resp 20 12/29/18 1456  SpO2 90 % 12/29/18 1456  Vitals shown include unvalidated device data.  Last Pain:  Vitals:   12/29/18 0646  PainSc: 0-No pain         Complications: No apparent anesthesia complications. BP remains low in PACU, Dr. Ola Spurr notified. Starting phenylephrine infusion.

## 2018-12-29 NOTE — Consult Note (Signed)
NAME:  Amanda Conner, MRN:  [id number], DOB:  19-May-1951, LOS: 0 ADMISSION DATE:  12/29/2018, CONSULTATION DATE:  12/29/18 REFERRING MD:  Vertell Limber - NSGY, CHIEF COMPLAINT:  Post-operative shock   Brief History   Amanda Conner is a 68 y.o. female who was admitted 6/16 for elective anterior retroperitoneal exposure of L5-S1 with L5-S1 anterior lumber interbody fusion, L2-L5 anterolateral lumbar interbody fusion (left), lumbar percutaneous pedicle screw placement L2-S1. Post operatively had brief hypotension and SVT; therefore, transferred to ICU for overnight obs.  History of present illness   Amanda Conner is a 68 y.o. female who has a PMH including but not limited to spinal stenosis, lumbar spondylolisthesis, lumbar scoliosis, lumbago, radiculopathy. (see "past medical history" for rest).  She presented to [facility] 6/16 for elective surgical intervention. She was taken to the OR for anterior retroperitoneal exposure of L5-S1 with L5-S1 anterior lumber interbody fusion, L2-L5 anterolateral lumbar interbody fusion (left), lumbar percutaneous pedicle screw placement L2-S1.  Post operatively, she had a bout of hypotension with lowest SBP 80.  This did not sustain and repeat SBP's were in low 100's to 120's.  She also had a brief run of SVT.  Hgb 8.5  Due to brief instability, she was admitted to the ICU for overnight observation.  PCCM was asked to assist in her care.  Past Medical History  Spinal stenosis, lumbar spondylolisthesis, lumbar scoliosis, lumbago, radiculopathy, arthritis, HTN, HLD, hiatal hernia, GERD, chronic cough, BCC of the nose.  Significant Hospital Events   6/16 > admit, to OR for anterior retroperitoneal exposure of L5-S1 with L5-S1 anterior lumber interbody fusion, L2-L5 anterolateral lumbar interbody fusion (left), lumbar percutaneous pedicle screw placement L2-S1.  Consults:  PCCM.  Procedures:  ETT 6/16 > 6/16.  Significant Diagnostic Tests:  None.   Micro Data:  None.  Antimicrobials:  Vanc 6/16 pre-op  Interim history/subjective:  On 47mcg Neo in PACU, SBP >110.  Complaining of back discomfort  NAD  Objective   Blood pressure 111/65, pulse 96, temperature (!) 97.2 F (36.2 C), resp. rate 14, height 5\' 4"  (1.626 m), weight 63.6 kg, SpO2 100 %.        Intake/Output Summary (Last 24 hours) at 12/29/2018 1718 Last data filed at 12/29/2018 1416 Gross per 24 hour  Intake 2750 ml  Output 1050 ml  Net 1700 ml   Filed Weights   12/29/18 0646  Weight: 63.6 kg    Examination: General: WDWN adult F, reclined on stretcher in PaCU NAD HENT: NCAT pink mmm patent nares. Trachea midline. Anicteric sclera Lungs: CTA. Symmetrical chest expansion. No increased WOB.  Cardiovascular: RRR s1s2 no rgm No JVD Capillary refill < 3 seconds Abdomen: Soft round, ndnt.  Extremities: Symmetrical bulk and tone. No cyanosis, no clubbing Neuro: Drowsy but awake, following commands. Moves BUE BLE. Interactive, conversant  Psych: Pleasant, appropriate for age and situation   Resolved Hospital Problem list     Assessment & Plan:   Post-operative shock -Medication induced vs Hypovolemic shock vs neurogenic related to spine surgery -Favor medication induced, with component of third spacing  -Intraop given 2252ml LR and 561ml albumin, as well as neo titration -Neo continued in PACU  - PCCM evaluation of IVC on bedside US in PACU does not suggest dramatic hypovolemia nor dramatic volume overload -Hgb does not meet criteria for transfusion of blood product P MAP goal > 65 Continue peripheral pressors for MAP goal, weal as able Anticipate some improvement as intraoperative medications continue to metabolize  Can also consider mIVF or bolus IVF if unable to wean pressors however favor allowing to normalize at the moment   Rest Per Primary  Best practice:  Diet: NPO Pain/Anxiety/Delirium protocol (if indicated): PRN hydrocodone/acet, PRN  dilaudid, PRN oxycodone  VAP protocol (if indicated): n/a DVT prophylaxis: SCD GI prophylaxis: n/a Glucose control: n/a Mobility: bedrest  Code Status: Full  Family Communication: patient updated in PACU  Disposition: admit to ICU   Labs   CBC: Recent Labs  Lab 12/25/18 1358 12/29/18 1130 12/29/18 1513  WBC 6.8  --   --   HGB 12.6 10.9* 8.5*  HCT 38.6 32.0* 25.0*  MCV 90.2  --   --   PLT 366  --   --     Basic Metabolic Panel: Recent Labs  Lab 12/25/18 1358 12/29/18 1130 12/29/18 1513  NA 137 132* 133*  K 4.6 3.6 3.4*  CL 99  --   --   CO2 28  --   --   GLUCOSE 94  --   --   BUN 18  --   --   CREATININE 0.74  --   --   CALCIUM 9.4  --   --    GFR: Estimated Creatinine Clearance: 58.1 mL/min (by C-G formula based on SCr of 0.74 mg/dL). Recent Labs  Lab 12/25/18 1358  WBC 6.8    Liver Function Tests: No results for input(s): AST, ALT, ALKPHOS, BILITOT, PROT, ALBUMIN in the last 168 hours. No results for input(s): LIPASE, AMYLASE in the last 168 hours. No results for input(s): AMMONIA in the last 168 hours.  ABG    Component Value Date/Time   PHART 7.428 12/29/2018 1513   PCO2ART 38.8 12/29/2018 1513   PO2ART 177.0 (H) 12/29/2018 1513   HCO3 25.7 12/29/2018 1513   TCO2 27 12/29/2018 1513   O2SAT 100.0 12/29/2018 1513     Coagulation Profile: No results for input(s): INR, PROTIME in the last 168 hours.  Cardiac Enzymes: No results for input(s): CKTOTAL, CKMB, CKMBINDEX, TROPONINI in the last 168 hours.  HbA1C: No results found for: HGBA1C  CBG: No results for input(s): GLUCAP in the last 168 hours.  Review of Systems:   Patient acutely encephalopathic post-operatively   Past Medical History  She,  has a past medical history of Arthritis, Back pain, Bronchiectasis ([place]), Cancer ([place]), Chronic cough, Complication of anesthesia, Dyspnea, Gastrointestinal symptoms, GERD (gastroesophageal reflux disease), Glaucoma, History of hiatal hernia,  Hyperlipemia, Hypertension, Osteopenia, Pneumonia, PONV (postoperative nausea and vomiting), and Spondylisthesis.   Surgical History    Past Surgical History:  Procedure Laterality Date  . ABDOMINOPLASTY  2002  . ABDOMINOPLASTY  1970's  . ANKLE SURGERY  10/2015  . BASAL CELL CARCINOMA EXCISION  06/2018   nose  . BLEPHAROPLASTY Bilateral 1999  . BREAST ENHANCEMENT SURGERY  1970's  . COLONOSCOPY    . CYSTOURETHROSCOPY  2018   Doble J ureteal stents  . DECOMPRESSION CORE HIP Left 2014  . FACIAL COSMETIC SURGERY  2004  . FLUOROSCOPY GUIDANCE    . HIP ARTHROPLASTY Left   . JOINT REPLACEMENT Left    05/2014  . LAPAROSCOPIC PARTIAL COLECTOMY  06/26/2017  . MOHS SURGERY  2019   nose  . PARTIAL COLECTOMY  06/2017   for diverticulitis  . REMOVAL OF BILATERAL TISSUE EXPANDERS WITH PLACEMENT OF BILATERAL BREAST IMPLANTS  2004  . THIGH LIFT  1994  . TOE FUSION Right    great toe  . TONSILLECTOMY    .  UMBILICAL HERNIA REPAIR     with tummy tuck  . UMBILICAL HERNIA REPAIR  2002     Social History   reports that she has never smoked. She has never used smokeless tobacco. She reports current alcohol use. She reports that she does not use drugs.   Family History   Her family history includes COPD in her mother; Heart disease in her father; Hypertension in her father and mother.   Allergies Allergies  Allergen Reactions  . Amikacin Other (See Comments)    Eosinophilia with chills and aching when given with cefoxitin  . Biaxin [Clarithromycin] Itching  . Cefoxitin Other (See Comments)    Eosinophlia when given with amikacin  . Sulfamethoxazole-Trimethoprim Itching  . Bacitracin-Polymyxin B Rash     Home Medications  Prior to Admission medications   Medication Sig Start Date End Date Taking? Authorizing Provider  albuterol (VENTOLIN HFA) 108 (90 Base) MCG/ACT inhaler Inhale 2 puffs into the lungs every 6 (six) hours as needed for wheezing or shortness of breath.   Yes [provider]  Biotin 1000 MCG tablet Take 1,000 mcg by mouth daily.   Yes [provider]  Calcium Carb-Cholecalciferol (CALCIUM 600/VITAMIN D3) 600-800 MG-UNIT TABS Take 1 tablet by mouth daily.   Yes [provider]  cyclobenzaprine (FLEXERIL) 10 MG tablet Take 10 mg by mouth 3 (three) times daily as needed for muscle spasms.    Yes [provider]  dextromethorphan-guaiFENesin (MUCINEX DM) 30-600 MG 12hr tablet Take 1 tablet by mouth daily.    Yes [provider]  estrogen, conjugated,-medroxyprogesterone (PREMPRO) 0.45-1.5 MG tablet Take 1 tablet by mouth daily.   Yes [provider]  fluticasone (FLONASE) 50 MCG/ACT nasal spray Place 1 spray into both nostrils daily.   Yes [provider]  latanoprost (XALATAN) 0.005 % ophthalmic solution Place 1 drop into both eyes at bedtime.    Yes [provider]  lisinopril-hydrochlorothiazide (PRINZIDE,ZESTORETIC) 20-25 MG tablet Take 1 tablet by mouth daily.   Yes [provider]  loratadine (CLARITIN) 10 MG tablet Take 10 mg by mouth daily.   Yes [provider]  pantoprazole (PROTONIX) 40 MG tablet Take 40 mg by mouth daily.   Yes [provider]  SUMAtriptan (IMITREX) 100 MG tablet Take 100 mg by mouth every 2 (two) hours as needed for migraine. May repeat in 2 hours if headache persists or recurs.    [provider]     Critical care time: 35 minutes       Eliseo Gum MSN, AGACNP-BC [place] [phone] If no answer, [phone] 12/29/2018, 5:18 PM

## 2018-12-29 NOTE — Op Note (Signed)
12/29/2018  2:47 PM  PATIENT:  Amanda Conner  68 y.o. female  PRE-OPERATIVE DIAGNOSIS:  Spinal stenosis of lumbosacral region, lumbar spondylolisthesis, lumbar scoliosis, lumbago, radiculopathy L 2 - S 1 levels  POST-OPERATIVE DIAGNOSIS:  Spinal stenosis of lumbosacral region, lumbar spondylolisthesis, lumbar scoliosis, lumbago, radiculopathy L 2 - S 1 levels   PROCEDURE:  Procedure(s) with comments: Lumbar Five Sacral One Anterior lumbar interbody fusion (N/A) - anterior approach Lumbar Two to Lumbar Five Anterolateral lumbar interbody fusion (Left) - anterolateral ABDOMINAL EXPOSURE (N/A) Lumbar Percutaneous Pedicle Screw Placement Lumbar two-Sacral one (N/A)  SURGEON:  Surgeon(s) and Role: Panel 1:    Erline Levine, MD - Primary    Earnie Larsson, MD - Assisting Panel 2:    * Angelia Mould, MD - Primary  PHYSICIAN ASSISTANT:   ASSISTANTS: [place], RN   ANESTHESIA:   general  EBL:  300 mL   BLOOD ADMINISTERED:none  DRAINS: none   LOCAL MEDICATIONS USED:  MARCAINE    and LIDOCAINE   SPECIMEN:  No Specimen  DISPOSITION OF SPECIMEN:  N/A  COUNTS:  YES  TOURNIQUET:  * No tourniquets in log *  DICTATION: DICTATION:   INDICATIONS:  Pateint is 68 year old female with chronic and intractable back and bilateral lower extremity pain, right greater than left with scoliosis and spondylolisthesis over multiple levels.   She has a lateral disc herniation and right foraminal stenosis at L 5 S 1.   It was elected to take her to surgery for anterior lumbar decompression and fusion at the L 23, L 34, L45, L 5 S1 levels.   PROCEDURE:  Doctor Scot Dock performed exposure and his portion of the procedure will be dictated separately.  Upon exposing the L 5 S1 level, a localizing X ray was obtained with the C arm.  I then incised the anterior annulus and performed a thorough discectomy with wide ligamentous releases.  The endplates were cleared of disc and cartilagenous  material and a thorough discectomy was performed with decompression of the ventral annulus and disc material.  After trials, a 15 degree, 8 x 38 x 28 Base titanium lordotic ALIF cage was placed and lagged with 2, 5.0 x 27.5 mm screws in S 1 and one in L 5. This was a  Titanium spacer which was packed with extra extra small BMP and Attrax.  The implant was tamped into position and positioning was confirmed with C arm.   Locking mechanisms were engaged, soft tissues were inspected and found to be in good repair.   Fascia was closed with 1 PDS running stitch, skin edges closed with 2-0 and 3-0 vicryl sutures.  Wound was dressed with a sterile occlusive dressing.    Patient was then  placed in a lateral decubitus position on the operative table and using orthogonally projected C-arm fluoroscopy the patient was placed so that the L 45 levels were visualized in AP and lateral plane. The patient was then taped into position.  Skin was marked along with a posterior finger dissection incision. Her flank was then prepped and draped in usual sterile fashion and incisions were made sequentially at L 45 level along the iliac crest and then at L3  working between the 11 th and 12 th ribs. Finger dissection was made to enter the retroperitoneal space and then subsequently the probe was inserted into the psoas muscle from the left side initially at the L45 level. After mapping the neural elements were able to dock  the probe per the midpoint of this vertebral level and without indications electrically of too close proximity to the neural tissues. Subsequently the self-retaining tractor was.after sequential dilators were utilized the shim was employed and the interspace was cleared of psoas muscle and then incised. A thorough discectomy was performed. Instruments were used to clear the interspace of disc material. After thorough discectomy was performed and this was performed using AP and lateral fluoroscopy a 10 lordotic by 55 x  18 mm implant was packed with small BMP and Attrax. This was tamped into position and its position was confirmed on AP and lateral fluoroscopy.  Hemostasis was assured the wounds were irrigated interrupted Vicryl sutures.Finger dissection was made to enter the retroperitoneal space and then subsequently the probe was inserted into the psoas muscle from the left side at the L34 level. After mapping the neural elements were able to dock the probe per the midpoint of this vertebral level and without indications electrically of too close proximity to the neural tissues. Subsequently the self-retaining tractor was.after sequential dilators were utilized the shim was employed and the interspace was cleared of psoas muscle and then incised. A thorough discectomy was performed. Instruments were used to clear the interspace of disc material. After thorough discectomy was performed and this was performed using AP and lateral fluoroscopy a 10 lordotic by 50 x 18 mm implant was packed in a similar fashion. This was tamped into position and its position was confirmed on AP and lateral fluoroscopy.  Hemostasis was assured Finger dissection was made to enter the retroperitoneal space and then subsequently the probe was inserted into the psoas muscle from the left side initially at the L23 level. After mapping the neural elements were able to dock the probe per the midpoint of this vertebral level and without indications electrically of too close proximity to the neural tissues. Subsequently the self-retaining tractor was.after sequential dilators were utilized the shim was employed and the interspace was cleared of psoas muscle and then incised. A thorough discectomy was performed. Instruments were used to clear the interspace of disc material. After thorough discectomy was performed and this was performed using AP and lateral fluoroscopy an 8 lordotc by 50 x 18 mm implant was packed with remaining BMP and Attrax. This was tamped  into position and its position was confirmed on AP and lateral fluoroscopy.  Hemostasis was assured at each level.  Incisions were closed with 0, 2-0, 3-0 vicryl sutures and dressed with Dermabond and an occlusive dressing.  The patient was then turned into a prone position on the [place] table on chest rolls and using AP and lateral fluoroscopy throughout this portion of the procedure, pedicle screws were placed using Nuvasive Reline cannulated percutaneous screws. After placing guide wires at each level with the use of nerve monitoring throughout, Nuvasive Reline towers were docked on the L2, L 3, L 4, L 5, S 1 levels.  Pedicle screws were placed bilaterally at L 2 (5.5 x 40 right and 5.5 x 35 left), 2 at L 3 (6.5 40, 2 at L 4 and  L 5 (6.5 x 45) and S1 (6.5 x 40 at each level). 120 mm rods were then affixed to the screw heads through a separate stab incision and locked down on the screws. All connections were then torqued and the Towers were disassembled. The wounds were irrigated and then closed with 1, 2-0 and 3-0 Vicryl stitches. Long-acting Marcaine was infiltrated in subcutaneous tissues.  Sterile occlusive dressing was  placed with Dermabond. The patient was then extubated in the operating room and taken to recovery in stable and satisfactory condition having tolerated his operation well. Counts were correct at the end of the case. Sterile occlusive dressing was placed with Dermabond. The patient was then extubated in the operating room and taken to recovery in stable and satisfactory condition having tolerated his operation well. Counts were correct at the end of the case.  Patient was extubated in the OR and taken to recovery having tolerated her surgery well.  Counts were correct.    Retractor Times: L 23 (14 mins); L 34 (28 mins); L 45 (17 mins).  Pelvic Parameters:  Preop:  PT 22 degrees; PI 53 degrees; LL -47; PI- LL +6 degree; SVA 72 mm;    PLAN OF CARE: Admit to inpatient   PATIENT  DISPOSITION:  PACU - hemodynamically stable.   Delay start of Pharmacological VTE agent (>24hrs) due to surgical blood loss or risk of bleeding: yes

## 2018-12-30 ENCOUNTER — Encounter (HOSPITAL_COMMUNITY): Payer: Self-pay | Admitting: Neurosurgery

## 2018-12-30 DIAGNOSIS — D62 Acute posthemorrhagic anemia: Secondary | ICD-10-CM

## 2018-12-30 LAB — CBC WITH DIFFERENTIAL/PLATELET
Abs Immature Granulocytes: 0.02 10*3/uL (ref 0.00–0.07)
Basophils Absolute: 0 10*3/uL (ref 0.0–0.1)
Basophils Relative: 0 %
Eosinophils Absolute: 0 10*3/uL (ref 0.0–0.5)
Eosinophils Relative: 0 %
HCT: 20.5 % — ABNORMAL LOW (ref 36.0–46.0)
Hemoglobin: 7 g/dL — ABNORMAL LOW (ref 12.0–15.0)
Immature Granulocytes: 0 %
Lymphocytes Relative: 22 %
Lymphs Abs: 1.4 10*3/uL (ref 0.7–4.0)
MCH: 29.2 pg (ref 26.0–34.0)
MCHC: 34.1 g/dL (ref 30.0–36.0)
MCV: 85.4 fL (ref 80.0–100.0)
Monocytes Absolute: 0.6 10*3/uL (ref 0.1–1.0)
Monocytes Relative: 9 %
Neutro Abs: 4.4 10*3/uL (ref 1.7–7.7)
Neutrophils Relative %: 69 %
Platelets: 218 10*3/uL (ref 150–400)
RBC: 2.4 MIL/uL — ABNORMAL LOW (ref 3.87–5.11)
RDW: 14.5 % (ref 11.5–15.5)
WBC: 6.3 10*3/uL (ref 4.0–10.5)
nRBC: 0 % (ref 0.0–0.2)

## 2018-12-30 LAB — HEMOGLOBIN AND HEMATOCRIT, BLOOD
HCT: 28.3 % — ABNORMAL LOW (ref 36.0–46.0)
Hemoglobin: 9.7 g/dL — ABNORMAL LOW (ref 12.0–15.0)

## 2018-12-30 LAB — PREPARE RBC (CROSSMATCH)

## 2018-12-30 MED ORDER — ACETAMINOPHEN 325 MG PO TABS
650.0000 mg | ORAL_TABLET | Freq: Once | ORAL | Status: AC
  Administered 2018-12-30: 650 mg via ORAL
  Filled 2018-12-30: qty 2

## 2018-12-30 MED ORDER — SODIUM CHLORIDE 0.9% IV SOLUTION
Freq: Once | INTRAVENOUS | Status: AC
  Administered 2018-12-30: 09:00:00 via INTRAVENOUS

## 2018-12-30 MED ORDER — SODIUM CHLORIDE 0.9% FLUSH
10.0000 mL | Freq: Two times a day (BID) | INTRAVENOUS | Status: DC
  Administered 2018-12-30 – 2019-01-04 (×7): 10 mL

## 2018-12-30 MED ORDER — DIPHENHYDRAMINE HCL 25 MG PO CAPS
25.0000 mg | ORAL_CAPSULE | Freq: Once | ORAL | Status: AC
  Administered 2018-12-30: 25 mg via ORAL
  Filled 2018-12-30: qty 1

## 2018-12-30 MED ORDER — CHLORHEXIDINE GLUCONATE CLOTH 2 % EX PADS
6.0000 | MEDICATED_PAD | Freq: Every day | CUTANEOUS | Status: DC
  Administered 2018-12-30 – 2019-01-02 (×4): 6 via TOPICAL

## 2018-12-30 MED ORDER — SODIUM CHLORIDE 0.9% FLUSH: 10.0000 mL | INTRAVENOUS | Status: DC | PRN

## 2018-12-30 MED FILL — Thrombin For Soln 5000 Unit: CUTANEOUS | Qty: 5000 | Status: AC

## 2018-12-30 MED FILL — Gelatin Absorbable MT Powder: OROMUCOSAL | Qty: 1 | Status: AC

## 2018-12-30 NOTE — Progress Notes (Signed)
   VASCULAR SURGERY ASSESSMENT & PLAN:   POD 1 - RP EXPOSURE L5-S1: The patient is doing well.  She has a palpable left dorsalis pedis pulse.  Vascular surgery will be available as needed.   SUBJECTIVE:   Pain well controlled this morning.  PHYSICAL EXAM:   Vitals:   12/30/18 0400 12/30/18 0500 12/30/18 0600 12/30/18 0640  BP: 106/65 95/63 101/63 (!) 80/62  Pulse: 99 98 (!) 104 (!) 110  Resp: 16 15 19 18   Temp: (!) 97.5 F (36.4 C)     TempSrc: Oral     SpO2: 100% 100% 99% 97%  Weight:      Height:       Palpable left dorsalis pedis pulse.  LABS:   Lab Results  Component Value Date   WBC 6.3 12/30/2018   HGB 7.0 (L) 12/30/2018   HCT 20.5 (L) 12/30/2018   MCV 85.4 12/30/2018   PLT 218 12/30/2018   Lab Results  Component Value Date   CREATININE 0.74 12/25/2018    PROBLEM LIST:    Active Problems:   Lumbar scoliosis   CURRENT MEDS:   . calcium-vitamin D  1 tablet Oral Q breakfast  . dextromethorphan-guaiFENesin  1 tablet Oral Daily  . docusate sodium  100 mg Oral BID  . estrogens (conjugated)  0.45 mg Oral Daily   And  . medroxyPROGESTERone  1.25 mg Oral Daily  . fluticasone  1 spray Each Nare Daily  . lisinopril  20 mg Oral Daily   And  . hydrochlorothiazide  25 mg Oral Daily  . latanoprost  1 drop Both Eyes QHS  . loratadine  10 mg Oral Daily  . pantoprazole  40 mg Oral Daily  . sodium chloride flush  3 mL Intravenous Q12H    [name] Beeper: [phone] Office: [phone] 12/30/2018

## 2018-12-30 NOTE — Evaluation (Signed)
Physical Therapy Evaluation Patient Details Name: Amanda Conner MRN: [id number] DOB: 1950/08/03 Today's Date: 12/30/2018   History of Present Illness  68 y.o. female with s/p L5-S1 ALIF. PMH including Spinal stenosis, lumbar spondylolisthesis, lumbar scoliosis, lumbago, radiculopathy, arthritis, HTN, HLD, hiatal hernia, and GERD.  Clinical Impression  Patient is s/p above surgery resulting in the deficits listed below (see PT Problem List). Pt presents with decreased mobility secondary to back and LLE pain, gait abnormalities, and balance deficits. Pt ambulating 10 feet with two person minimal assist and walker. Requires mod assist for bed mobility. BP stable. Education re: spinal precautions, generalized walking program, activity recommendations. Patient will benefit from skilled PT to increase their independence and safety with mobility (while adhering to their precautions) to allow discharge to the venue listed below.     Follow Up Recommendations Home health PT;Supervision for mobility/OOB    Equipment Recommendations  None recommended by PT    Recommendations for Other Services       Precautions / Restrictions Precautions Precautions: Back Precaution Booklet Issued: Yes (comment) Precaution Comments: Pt able to recall 2/3 back precautions and then require 1 visual cue for no lifting Required Braces or Orthoses: Spinal Brace Spinal Brace: Lumbar corset;Applied in sitting position Restrictions Weight Bearing Restrictions: No      Mobility  Bed Mobility Overal bed mobility: Needs Assistance Bed Mobility: Rolling;Sidelying to Sit Rolling: Min assist;+2 for safety/equipment Sidelying to sit: Mod assist;+2 for safety/equipment       General bed mobility comments: Mod A to elevate trunk into sitting position and to assist in brining LLE off EOB  Transfers Overall transfer level: Needs assistance Equipment used: Rolling walker (2 wheeled) Transfers: Sit to/from  Stand Sit to Stand: Min assist;+2 physical assistance;From elevated surface         General transfer comment: Min A +2 for power up into standing. Cues for hand positioning. Min A for safe descent  Ambulation/Gait Ambulation/Gait assistance: Min guard;+2 safety/equipment Gait Distance (Feet): 10 Feet Assistive device: Rolling walker (2 wheeled) Gait Pattern/deviations: Step-to pattern;Antalgic;Decreased weight shift to left;Decreased stance time - left Gait velocity: decreased Gait velocity interpretation: <1.8 ft/sec, indicate of risk for recurrent falls General Gait Details: Cues for sequencing with walker pattern and progressing LLE first. Slow, guarded gait pattern.  Stairs            Wheelchair Mobility    Modified Rankin (Stroke Patients Only)       Balance Overall balance assessment: Needs assistance Sitting-balance support: No upper extremity supported;Feet supported Sitting balance-Leahy Scale: Fair     Standing balance support: Bilateral upper extremity supported;During functional activity Standing balance-Leahy Scale: Poor Standing balance comment: Reliant on UE support                             Pertinent Vitals/Pain Pain Assessment: 0-10 Pain Score: 8  Pain Location: incisional, left inner groin Pain Descriptors / Indicators: Constant;Discomfort;Grimacing;Guarding Pain Intervention(s): Monitored during session;Limited activity within patient's tolerance;Repositioned    Home Living Family/patient expects to be discharged to:: Private residence Living Arrangements: Spouse/significant other Available Help at Discharge: Family;Available 24 hours/day Type of Home: House Home Access: Stairs to enter   [employer] of Steps: 1 Home Layout: Able to live on main level with bedroom/bathroom Home Equipment: Walker - 2 wheels;Shower seat      Prior Function Level of Independence: Needs assistance   Gait / Transfers Assistance  Needed: community ambulatory  ADL's /  Homemaking Assistance Needed: Pt performing BADLs. Husband performing IADLs recently due to back pain        Hand Dominance   Dominant Hand: Left    Extremity/Trunk Assessment   Upper Extremity Assessment Upper Extremity Assessment: Overall WFL for tasks assessed    Lower Extremity Assessment Lower Extremity Assessment: LLE deficits/detail;RLE deficits/detail RLE Deficits / Details: WFL LLE Deficits / Details: At least anti gravity, unable to formally assess due to pain    Cervical / Trunk Assessment Cervical / Trunk Assessment: Other exceptions Cervical / Trunk Exceptions: s/p back sx  Communication   Communication: No difficulties  Cognition Arousal/Alertness: Awake/alert Behavior During Therapy: WFL for tasks assessed/performed Overall Cognitive Status: Within Functional Limits for tasks assessed                                        General Comments General comments (skin integrity, edema, etc.): VSS throughout    Exercises     Assessment/Plan    PT Assessment Patient needs continued PT services  PT Problem List Decreased strength;Decreased activity tolerance;Decreased balance;Decreased mobility;Pain       PT Treatment Interventions DME instruction;Stair training;Gait training;Functional mobility training;Therapeutic activities;Therapeutic exercise;Balance training;Patient/family education    PT Goals (Current goals can be found in the Care Plan section)  Acute Rehab PT Goals Patient Stated Goal: "Go home" PT Goal Formulation: With patient Time For Goal Achievement: 01/13/19 Potential to Achieve Goals: Good    Frequency Min 5X/week   Barriers to discharge        Co-evaluation PT/OT/SLP Co-Evaluation/Treatment: Yes Reason for Co-Treatment: For patient/therapist safety;To address functional/ADL transfers PT goals addressed during session: Mobility/safety with mobility OT goals addressed during  session: ADL's and self-care       AM-PAC PT "6 Clicks" Mobility  Outcome Measure Help needed turning from your back to your side while in a flat bed without using bedrails?: None Help needed moving from lying on your back to sitting on the side of a flat bed without using bedrails?: A Lot Help needed moving to and from a bed to a chair (including a wheelchair)?: A Little Help needed standing up from a chair using your arms (e.g., wheelchair or bedside chair)?: A Little Help needed to walk in hospital room?: A Little Help needed climbing 3-5 steps with a railing? : A Lot 6 Click Score: 17    End of Session Equipment Utilized During Treatment: Gait belt;Back brace Activity Tolerance: Patient tolerated treatment well Patient left: in chair;with call bell/phone within reach;with chair alarm set Nurse Communication: Mobility status PT Visit Diagnosis: Pain;Difficulty in walking, not elsewhere classified (R26.2) Pain - Right/Left: Left Pain - part of body: Leg    Time: 1010-1038 PT Time Calculation (min) (ACUTE ONLY): 28 min   Charges:   PT Evaluation $PT Eval Moderate Complexity: 1 Mod          Ellamae Sia, PT, DPT Acute Rehabilitation Services Pager [phone] Office [phone]   Willy Eddy 12/30/2018, 1:07 PM

## 2018-12-30 NOTE — Evaluation (Signed)
Occupational Therapy Evaluation Patient Details Name: Amanda Conner MRN: [id number] DOB: 05/24/1951 Today's Date: 12/30/2018    History of Present Illness 68 y.o. female with s/p L5-S1 ALIF. PMH including Spinal stenosis, lumbar spondylolisthesis, lumbar scoliosis, lumbago, radiculopathy, arthritis, HTN, HLD, hiatal hernia, and GERD.   Clinical Impression   PTA, pt was living with her husband and was independent; recently her husband as been performing IADLs due to back pain. Pt currently requiring Min A for UB ADLs, Max A for LB ADLs, and Min A +2 for functional mobility with RW. Provided education and handout on back precautions, bed mobility, brace management, and compensatory techniques for ADLs. Pt will require further acute OT to continue education on techniques for LB ADLs, toileting, and functional transfers. Pt would benefit from further acute OT to facilitate safe dc. Recommend dc to home with HHOT for further OT to optimize safety, independence with ADLs, and return to PLOF.      Follow Up Recommendations  Home health OT;Supervision/Assistance - 24 hour    Equipment Recommendations  None recommended by OT    Recommendations for Other Services PT consult     Precautions / Restrictions Precautions Precautions: Back Precaution Booklet Issued: Yes (comment) Precaution Comments: Pt able to recall 2/3 back precautions and then require 1 visual cue for no lifting Required Braces or Orthoses: Spinal Brace Spinal Brace: Lumbar corset;Applied in sitting position Restrictions Weight Bearing Restrictions: No      Mobility Bed Mobility Overal bed mobility: Needs Assistance Bed Mobility: Rolling;Sidelying to Sit Rolling: Min assist;+2 for safety/equipment Sidelying to sit: Mod assist;+2 for safety/equipment       General bed mobility comments: Mod A to elevate trunk into sitting position and to assist in brining LLE off EOB  Transfers Overall transfer level: Needs  assistance Equipment used: Rolling walker (2 wheeled) Transfers: Sit to/from Stand Sit to Stand: Min assist;+2 physical assistance;From elevated surface         General transfer comment: Min A +2 for power up into standing. Cues for hand positioning. Min A for safe descent    Balance Overall balance assessment: Needs assistance Sitting-balance support: No upper extremity supported;Feet supported Sitting balance-Leahy Scale: Fair     Standing balance support: Bilateral upper extremity supported;During functional activity Standing balance-Leahy Scale: Poor Standing balance comment: Reliant on UE support                           ADL either performed or assessed with clinical judgement   ADL Overall ADL's : Needs assistance/impaired Eating/Feeding: Set up;Sitting   Grooming: Brushing hair;Set up;Supervision/safety;Sitting   Upper Body Bathing: Minimal assistance;Sitting   Lower Body Bathing: Maximal assistance;Sit to/from stand   Upper Body Dressing : Minimal assistance;Sitting Upper Body Dressing Details (indicate cue type and reason): Min A for positioning of brace Lower Body Dressing: Maximal assistance;Sitting/lateral leans Lower Body Dressing Details (indicate cue type and reason): Pt unable to bring ankles to knees Toilet Transfer: Minimal assistance;+2 for safety/equipment;Ambulation           Functional mobility during ADLs: Minimal assistance;+2 for safety/equipment;Rolling walker;+2 for physical assistance General ADL Comments: Pt presenting with decreased strength, balance, and activity tolerance.      Vision Baseline Vision/History: Wears glasses Patient Visual Report: No change from baseline       Perception     Praxis      Pertinent Vitals/Pain Pain Assessment: 0-10 Pain Score: 8  Pain Location: incisional, left inner  groin Pain Descriptors / Indicators: Constant;Discomfort;Grimacing;Guarding Pain Intervention(s): Monitored during  session;Limited activity within patient's tolerance;Repositioned     Hand Dominance Left   Extremity/Trunk Assessment Upper Extremity Assessment Upper Extremity Assessment: Overall WFL for tasks assessed   Lower Extremity Assessment Lower Extremity Assessment: Defer to PT evaluation   Cervical / Trunk Assessment Cervical / Trunk Assessment: Other exceptions Cervical / Trunk Exceptions: s/p back sx   Communication Communication Communication: No difficulties   Cognition Arousal/Alertness: Awake/alert Behavior During Therapy: WFL for tasks assessed/performed Overall Cognitive Status: Within Functional Limits for tasks assessed                                     General Comments  VSS throughout    Exercises     Shoulder Instructions      Home Living Family/patient expects to be discharged to:: Private residence Living Arrangements: Spouse/significant other Available Help at Discharge: Family;Available 24 hours/day Type of Home: House Home Access: Stairs to enter [employer] of Steps: 1   Home Layout: Able to live on main level with bedroom/bathroom     Bathroom Shower/Tub: Hospital doctor Toilet: Handicapped height     Home Equipment: Environmental consultant - 2 wheels;Shower seat          Prior Functioning/Environment Level of Independence: Needs assistance  Gait / Transfers Assistance Needed: community ambulatory ADL's / Homemaking Assistance Needed: Pt performing BADLs. Husband performing IADLs recently due to back pain            OT Problem List: Decreased strength;Decreased range of motion;Decreased activity tolerance;Impaired balance (sitting and/or standing);Decreased knowledge of use of DME or AE;Decreased knowledge of precautions;Pain      OT Treatment/Interventions: Self-care/ADL training;Therapeutic exercise;Energy conservation;DME and/or AE instruction;Therapeutic activities;Patient/family education    OT Goals(Current  goals can be found in the care plan section) Acute Rehab OT Goals Patient Stated Goal: "Go home" OT Goal Formulation: With patient Time For Goal Achievement: 01/13/19 Potential to Achieve Goals: Good  OT Frequency: Min 3X/week   Barriers to D/C:            Co-evaluation PT/OT/SLP Co-Evaluation/Treatment: Yes Reason for Co-Treatment: For patient/therapist safety;To address functional/ADL transfers   OT goals addressed during session: ADL's and self-care      AM-PAC OT "6 Clicks" Daily Activity     Outcome Measure Help from another person eating meals?: None Help from another person taking care of personal grooming?: A Little Help from another person toileting, which includes using toliet, bedpan, or urinal?: A Lot Help from another person bathing (including washing, rinsing, drying)?: A Lot Help from another person to put on and taking off regular upper body clothing?: A Little Help from another person to put on and taking off regular lower body clothing?: A Lot 6 Click Score: 16   End of Session Equipment Utilized During Treatment: Gait belt;Rolling walker;Back brace Nurse Communication: Mobility status  Activity Tolerance: Patient tolerated treatment well Patient left: in chair;with call bell/phone within reach;with chair alarm set  OT Visit Diagnosis: Unsteadiness on feet (R26.81);Other abnormalities of gait and mobility (R26.89);Muscle weakness (generalized) (M62.81);Pain Pain - Right/Left: Left Pain - part of body: Leg(Back)                Time: 6720-9470 OT Time Calculation (min): 25 min Charges:  OT General Charges $OT Visit: 1 Visit OT Evaluation $OT Eval Moderate Complexity: [place]  MSOT, OTR/L Acute Rehab Pager: [phone] Office: [place] 12/30/2018, 12:47 PM

## 2018-12-30 NOTE — Progress Notes (Addendum)
Subjective: Patient reports incisional soreness and sore in left hip flexor region  Objective: Vital signs in last 24 hours: Temp:  [97.2 F (36.2 C)-97.9 F (36.6 C)] 97.5 F (36.4 C) (06/17 0400) Pulse Rate:  [64-125] 102 (06/17 0715) Resp:  [8-28] 17 (06/17 0715) BP: (77-124)/(53-79) 92/58 (06/17 0715) SpO2:  [90 %-100 %] 93 % (06/17 0715) Arterial Line BP: (71-149)/(46-112) 149/58 (06/16 2100)  Intake/Output from previous day: 06/16 0701 - 06/17 0700 In: 4207.2 [P.O.:100; I.V.:3407.3; IV Piggyback:699.9] Out: 1500 [Urine:1200; Blood:300] Intake/Output this shift: No intake/output data recorded.  Physical Exam: Abdomen is soft and not distended.  Incisions are CDI.  Quiescent bowel sounds.  Full strength both legs without numbness, apart from left HF, which is better than antigravity.  Lab Results: Recent Labs    12/29/18 1513 12/30/18 0233  WBC  --  6.3  HGB 8.5* 7.0*  HCT 25.0* 20.5*  PLT  --  218   BMET Recent Labs    12/29/18 1130 12/29/18 1513  NA 132* 133*  K 3.6 3.4*    Studies/Results: Dg Lumbar Spine Complete  Result Date: 12/29/2018 CLINICAL DATA:  Back pain EXAM: LUMBAR SPINE - COMPLETE 4+ VIEW COMPARISON:  None. FINDINGS: The patient has undergone lumbar fusion from L2 through S1. The hardware appears intact. There are multilevel interbody spacers. A total of 9 images were submitted. The fluoroscopy time was 6 minutes and 51 seconds. IMPRESSION: Status post lumbar fusion as above. Electronically Signed   By: Constance Holster M.D.   On: 12/29/2018 16:20   Dg C-arm 1-60 Min  Result Date: 12/29/2018 CLINICAL DATA:  Back pain.  Posterior fusion. EXAM: DG C-ARM 61-120 MIN COMPARISON:  None. FLUOROSCOPY TIME:  Fluoroscopy Time:  6 minutes and 51 seconds Number of Acquired Spot Images: 9 FINDINGS: The patient has undergone posterior fusion from L2 through S1. Multilevel interbody spaces are noted. The hardware appears intact. A total of 9 images were  submitted for review. The total fluoroscopy time was 6 minutes and 51 seconds. IMPRESSION: Status post lumbar fusion as above. Electronically Signed   By: Constance Holster M.D.   On: 12/29/2018 16:19   Dg Or Local Abdomen  Result Date: 12/29/2018 CLINICAL DATA:  Status post anterior lumbar interbody fusion. Evaluate for surgical instrument. EXAM: OR LOCAL ABDOMEN COMPARISON:  None. FINDINGS: There is interval anterior lumbar interbody fusion. There is no retained radiopaque surgical instrument. There is no bowel dilatation to suggest obstruction. There is no evidence of pneumoperitoneum, portal venous gas or pneumatosis. There are no pathologic calcifications along the expected course of the ureters. There is a left hip arthroplasty. IMPRESSION: There is interval anterior lumbar interbody fusion. There is no retained radiopaque surgical instrument. These results were called by telephone at the time of interpretation on 12/29/2018 at 10:09 am to Nurse Anderson Malta in [place], who verbally acknowledged these results. Electronically Signed   By: Kathreen Devoid   On: 12/29/2018 10:09    Assessment/Plan: No longer requiring pressors.  No more SVT.  Symptomatic anemia, for which I will transfuse 2 units of blood.  Continue to observe in ICU today.  Mobilize with PT today.  Continue Foley at present.  Advance diet as tolerated.  I have discussed situation with patient's husband, Gershon Mussel.    LOS: 1 day    Peggyann Shoals, MD 12/30/2018, 7:46 AM

## 2018-12-30 NOTE — Anesthesia Postprocedure Evaluation (Signed)
Anesthesia Post Note  Patient: Amanda Conner  Procedure(s) Performed: Lumbar Five Sacral One Anterior lumbar interbody fusion (N/A Spine Lumbar) Lumbar Two to Lumbar Five Anterolateral lumbar interbody fusion (Left Spine Lumbar) Lumbar Percutaneous Pedicle Screw Placement Lumbar two-Sacral one (N/A Back) ABDOMINAL EXPOSURE (N/A Abdomen)     Patient location during evaluation: PACU Anesthesia Type: General Level of consciousness: awake and alert Pain management: pain level controlled Vital Signs Assessment: post-procedure vital signs reviewed and stable Respiratory status: spontaneous breathing, nonlabored ventilation, respiratory function stable and patient connected to nasal cannula oxygen Cardiovascular status: blood pressure returned to baseline and stable Postop Assessment: no apparent nausea or vomiting Anesthetic complications: no    Last Vitals:  Vitals:   12/30/18 1700 12/30/18 1800  BP: 107/66 119/68  Pulse: 96   Resp: 14 (!) 23  Temp:    SpO2: 96%     Last Pain:  Vitals:   12/30/18 1826  TempSrc:   PainSc: Tyler Deis

## 2018-12-30 NOTE — Progress Notes (Signed)
Attempted to get patient OOB this morning. Patient was given morphine for pain prior to trying to ambulate. Was able to get her sitting on edge of bed. 3 RNs assisted her in trying to stand up. She stood for less than a minute and her left leg started to buckle and she stated she had stabbing pain radiating down her left leg. She didn't feel she could stand much longer and she felt dizzy so she was assisted back into the bed. BP prior to standing was 101/63 and then BP upon returning back to bed was 80/62 with a MAP 67. HR sinus tach 110s.

## 2018-12-30 NOTE — Social Work (Signed)
CSW acknowledging consult for SNF placement. Will follow for therapy recommendations.   Treyvonne Tata, MSW, LCSWA [place] Clinical Social Work [phone]   

## 2018-12-30 NOTE — Progress Notes (Signed)
NAME:  Amanda Conner, MRN:  [id number], DOB:  Dec 11, 1950, LOS: 1 ADMISSION DATE:  12/29/2018, CONSULTATION DATE:  12/29/18 REFERRING MD:  Vertell Limber - NSGY, CHIEF COMPLAINT:  Post-operative shock   Brief History   Amanda Conner is a 68 y.o. female who was admitted 6/16 for elective anterior retroperitoneal exposure of L5-S1 with L5-S1 anterior lumber interbody fusion, L2-L5 anterolateral lumbar interbody fusion (left), lumbar percutaneous pedicle screw placement L2-S1. Post operatively had brief hypotension and SVT; therefore, transferred to ICU for overnight obs.  Past Medical History  Spinal stenosis, lumbar spondylolisthesis, lumbar scoliosis, lumbago, radiculopathy, arthritis, HTN, HLD, hiatal hernia, GERD, chronic cough, BCC of the nose.  Significant Hospital Events   6/16 Admit, to OR for anterior retroperitoneal exposure of L5-S1 with L5-S1 anterior lumber interbody fusion, L2-L5 anterolateral lumbar interbody fusion (left), lumbar percutaneous pedicle screw placement L2-S1.  Consults:  PCCM  Procedures:  ETT 6/16 > 6/16  Significant Diagnostic Tests:     Micro Data:  COVID 6/12 >> negative   Antimicrobials:  Vanc 6/16 pre-op  Interim history/subjective:  Pt reports mild nausea, afraid to eat anything.  Post-op pain.  Attempted to sit on side of bed this am and was unable to stand.  Noted Hgb drop from 10.9 to 7.   Objective   Blood pressure (!) 92/58, pulse (!) 102, temperature (!) 97.5 F (36.4 C), temperature source Oral, resp. rate 17, height 5\' 4"  (1.626 m), weight 63.6 kg, SpO2 93 %.        Intake/Output Summary (Last 24 hours) at 12/30/2018 0750 Last data filed at 12/30/2018 0700 Gross per 24 hour  Intake 4207.15 ml  Output 1500 ml  Net 2707.15 ml   Filed Weights   12/29/18 0646  Weight: 63.6 kg    Examination: General: adult female lying in bed in NAD, appears uncomfortable  HEENT: MM pink/dry, no jvd Neuro: AAOx4, speech clear, MAE  CV:  s1s2 rrr, no m/r/g, SR with mild tachy PULM: even/non-labored, lungs bilaterally clear  GI: soft, non-tender, bsx4 active, lower abdominal waffle dressing intact / clean Extremities: warm/dry, no edema  Skin: no rashes or lesions  Resolved Hospital Problem list     Assessment & Plan:   Post-Operative Shock -Medication induced vs Hypovolemic shock vs neurogenic related to spine surgery -POCUS eval of IVC post-op did not suggest dramatic hypovolemia or overload P: Monitor in ICU  Pending PRBC per Primary SVC  Hold home antihypertensives - lisinopril / HCTZ MAP Goal >65   Anemia  -post op, preadmit hgb 12.6, admit hgb 10.9, post op 7 P: Transfuse per ICU guidelines Consider 1 unit then follow up Hgb > defer to primary  Follow up labs in am   Post-Op Spine Surgery  P: Incentive spirometry  Mobilize as able  Post-operative care per Primary  Pain control per Primary    Best practice:  Diet: Diet as tolerated  Pain/Anxiety/Delirium protocol (if indicated): PRN hydrocodone/acet, PRN dilaudid, PRN oxycodone  VAP protocol (if indicated): n/a DVT prophylaxis: SCD GI prophylaxis: n/a Glucose control: n/a Mobility: bedrest  Code Status: Full  Family Communication: Patient updated on plan of care Disposition: ICU   Labs   CBC: Recent Labs  Lab 12/25/18 1358 12/29/18 1130 12/29/18 1513 12/30/18 0233  WBC 6.8  --   --  6.3  NEUTROABS  --   --   --  4.4  HGB 12.6 10.9* 8.5* 7.0*  HCT 38.6 32.0* 25.0* 20.5*  MCV 90.2  --   --  85.4  PLT 366  --   --  448    Basic Metabolic Panel: Recent Labs  Lab 12/25/18 1358 12/29/18 1130 12/29/18 1513  NA 137 132* 133*  K 4.6 3.6 3.4*  CL 99  --   --   CO2 28  --   --   GLUCOSE 94  --   --   BUN 18  --   --   CREATININE 0.74  --   --   CALCIUM 9.4  --   --    GFR: Estimated Creatinine Clearance: 58.1 mL/min (by C-G formula based on SCr of 0.74 mg/dL). Recent Labs  Lab 12/25/18 1358 12/30/18 0233  WBC 6.8 6.3     Liver Function Tests: No results for input(s): AST, ALT, ALKPHOS, BILITOT, PROT, ALBUMIN in the last 168 hours. No results for input(s): LIPASE, AMYLASE in the last 168 hours. No results for input(s): AMMONIA in the last 168 hours.  ABG    Component Value Date/Time   PHART 7.428 12/29/2018 1513   PCO2ART 38.8 12/29/2018 1513   PO2ART 177.0 (H) 12/29/2018 1513   HCO3 25.7 12/29/2018 1513   TCO2 27 12/29/2018 1513   O2SAT 100.0 12/29/2018 1513     Coagulation Profile: No results for input(s): INR, PROTIME in the last 168 hours.  Cardiac Enzymes: No results for input(s): CKTOTAL, CKMB, CKMBINDEX, TROPONINI in the last 168 hours.  HbA1C: No results found for: HGBA1C  CBG: No results for input(s): GLUCAP in the last 168 hours.   Critical care time:  n/a    Noe Gens, NP-C [facility] Pgr: [phone] or if no answer [phone] 12/30/2018, 7:52 AM

## 2018-12-31 ENCOUNTER — Inpatient Hospital Stay (HOSPITAL_COMMUNITY): Payer: Medicare Other

## 2018-12-31 LAB — BPAM RBC
Blood Product Expiration Date: 202007012359
Blood Product Expiration Date: 202007022359
ISSUE DATE / TIME: 202006170828
ISSUE DATE / TIME: 202006171105
Unit Type and Rh: 6200
Unit Type and Rh: 6200

## 2018-12-31 LAB — TYPE AND SCREEN
ABO/RH(D): A POS
Antibody Screen: NEGATIVE
Unit division: 0
Unit division: 0

## 2018-12-31 LAB — CBC
HCT: 26.7 % — ABNORMAL LOW (ref 36.0–46.0)
Hemoglobin: 9.3 g/dL — ABNORMAL LOW (ref 12.0–15.0)
MCH: 30.6 pg (ref 26.0–34.0)
MCHC: 34.8 g/dL (ref 30.0–36.0)
MCV: 87.8 fL (ref 80.0–100.0)
Platelets: 189 10*3/uL (ref 150–400)
RBC: 3.04 MIL/uL — ABNORMAL LOW (ref 3.87–5.11)
RDW: 14.6 % (ref 11.5–15.5)
WBC: 9.4 10*3/uL (ref 4.0–10.5)
nRBC: 0 % (ref 0.0–0.2)

## 2018-12-31 LAB — BASIC METABOLIC PANEL
Anion gap: 8 (ref 5–15)
BUN: 6 mg/dL — ABNORMAL LOW (ref 8–23)
CO2: 24 mmol/L (ref 22–32)
Calcium: 7.9 mg/dL — ABNORMAL LOW (ref 8.9–10.3)
Chloride: 99 mmol/L (ref 98–111)
Creatinine, Ser: 0.58 mg/dL (ref 0.44–1.00)
GFR calc Af Amer: >60 mL/min (ref >60)
GFR calc non Af Amer: >60 mL/min (ref >60)
Glucose, Bld: 117 mg/dL — ABNORMAL HIGH (ref 70–99)
Potassium: 3.8 mmol/L (ref 3.5–5.1)
Sodium: 131 mmol/L — ABNORMAL LOW (ref 135–145)

## 2018-12-31 IMAGING — CT CT ABDOMEN AND PELVIS WITHOUT CONTRAST
2 of 4 series · 15 of 46 positions shown, 17 images · non-contrast
Comparison: None.

CLINICAL DATA: Lumbar spine surgery 2 days ago. Anterior posterior
incisions. Patient complaining of diffuse pain.

EXAM:
CT ABDOMEN AND PELVIS WITHOUT CONTRAST
TECHNIQUE: Multidetector CT imaging of the abdomen and pelvis was performed
following the standard protocol without IV contrast.

[Series 3: abd/ pelvis 5.0 i30f 2 · axial · 0.69mm/px · z∈[+946,+1336]mm · 12 of 90 slices shown, 14 images]
[im 8/90  soft-tissue]
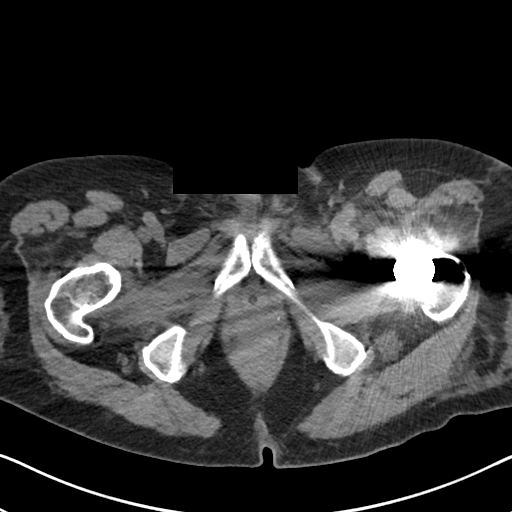
[im 8/90  bone]
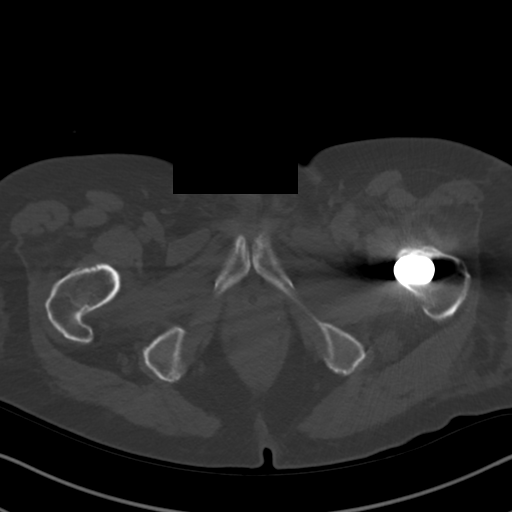
[im 15/90  soft-tissue]
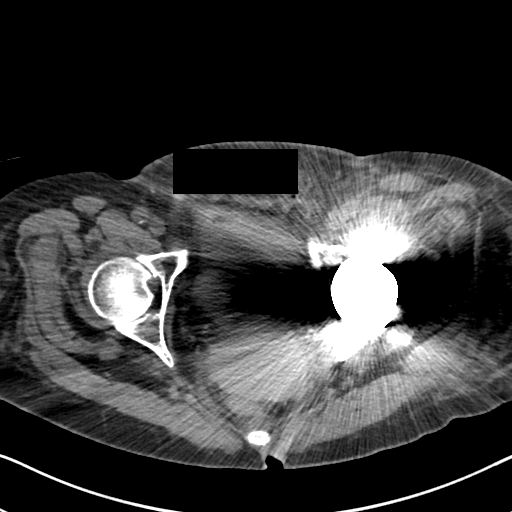
[im 22/90  soft-tissue]
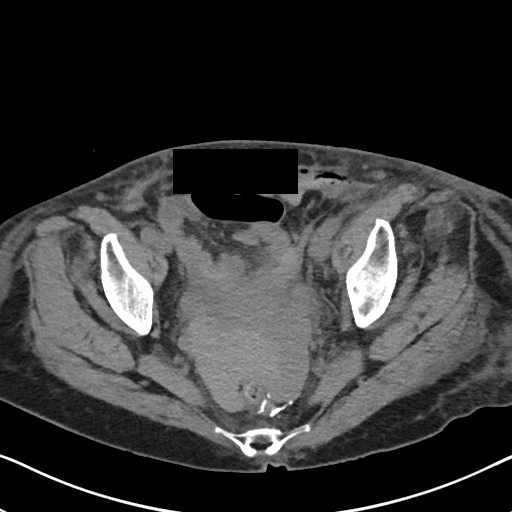
[im 29/90  soft-tissue]
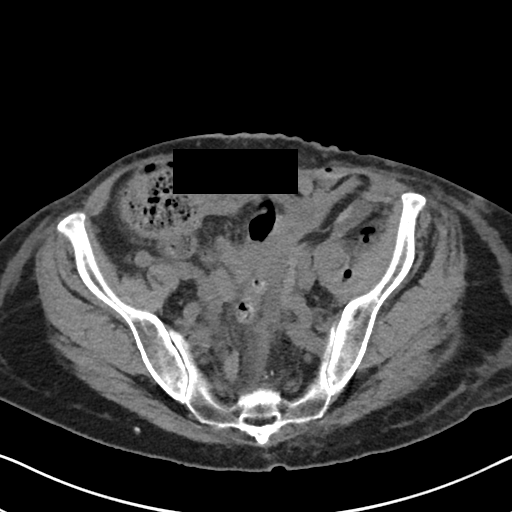
[im 36/90  soft-tissue]
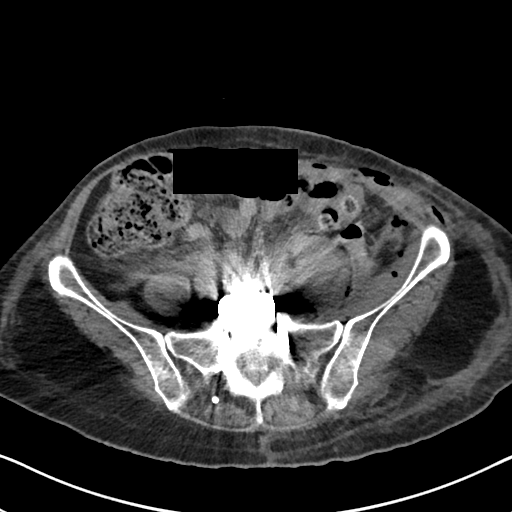
[im 43/90  soft-tissue]
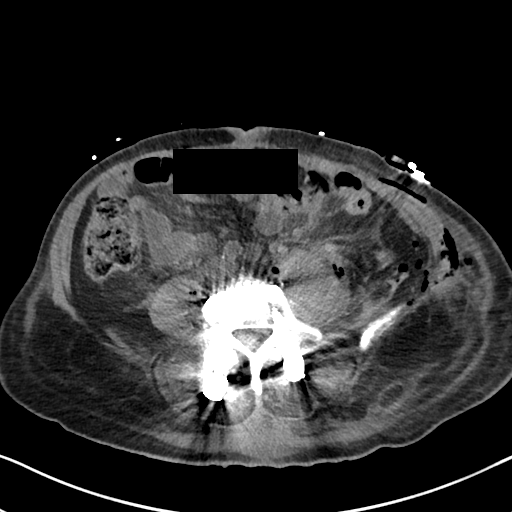
[im 50/90  soft-tissue]
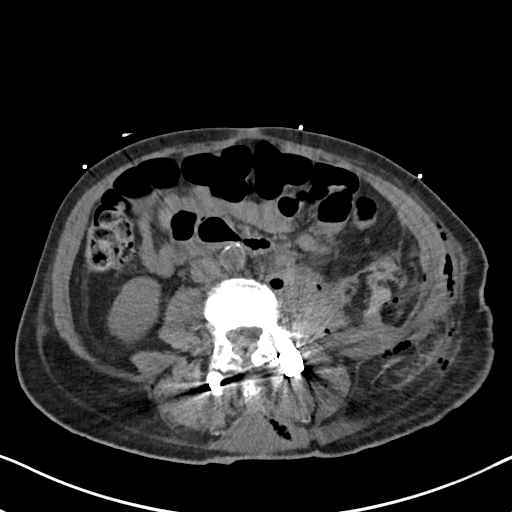
[im 57/90  soft-tissue]
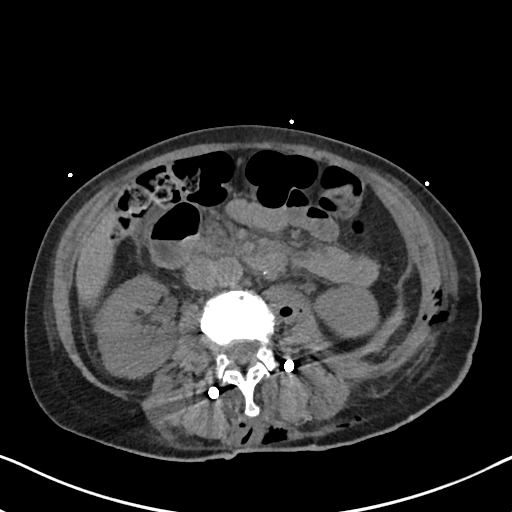
[im 65/90  soft-tissue]
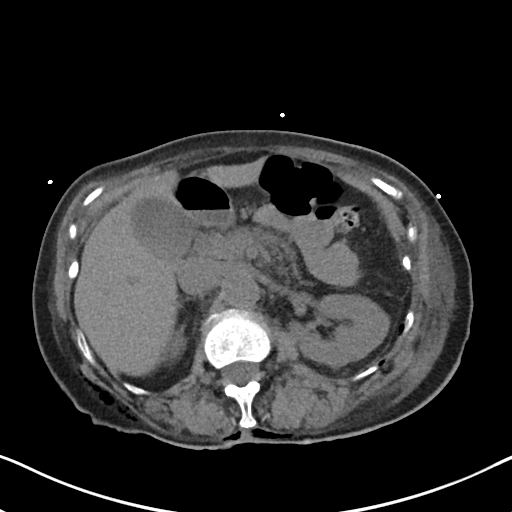
[im 65/90  bone]
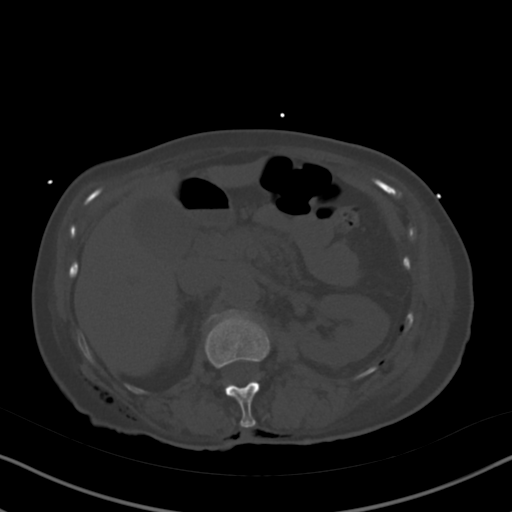
[im 72/90  soft-tissue]
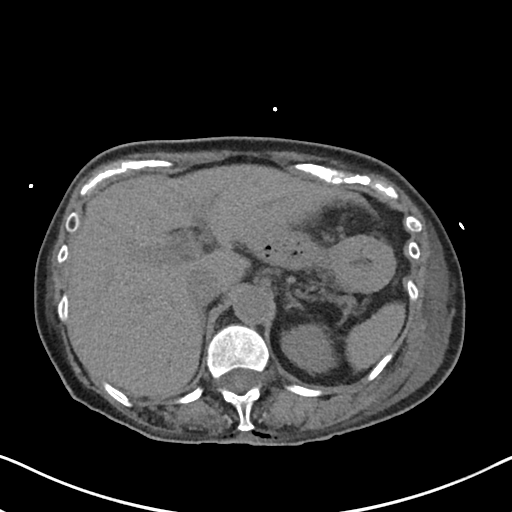
[im 79/90  soft-tissue]
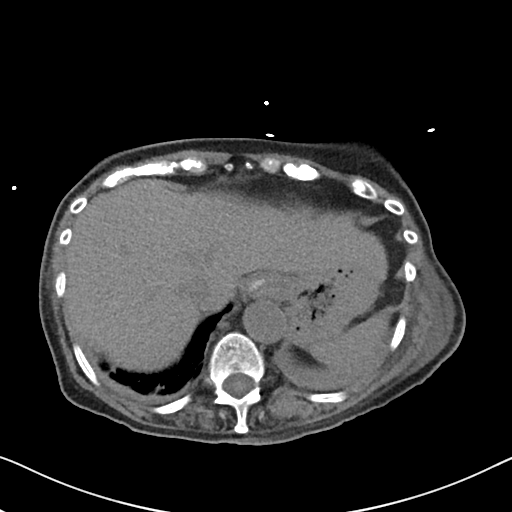
[im 86/90  soft-tissue]
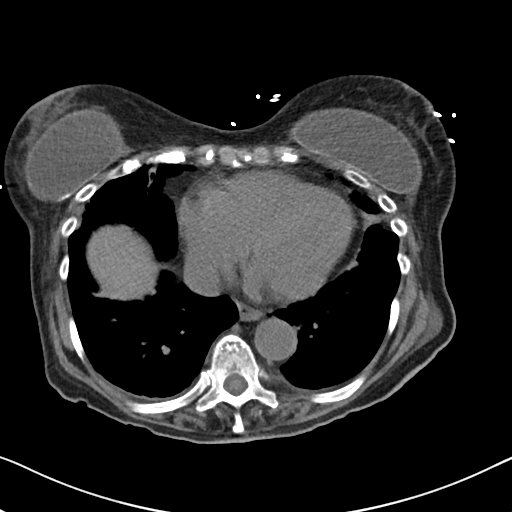

[Series 6: cor st · coronal · 0.68mm/px · 3 of 101 slices shown]
[im 34/101  soft-tissue]
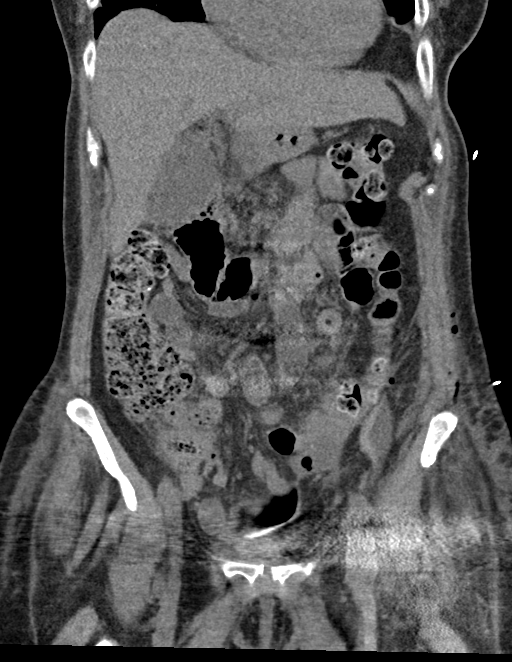
[im 45/101  soft-tissue]
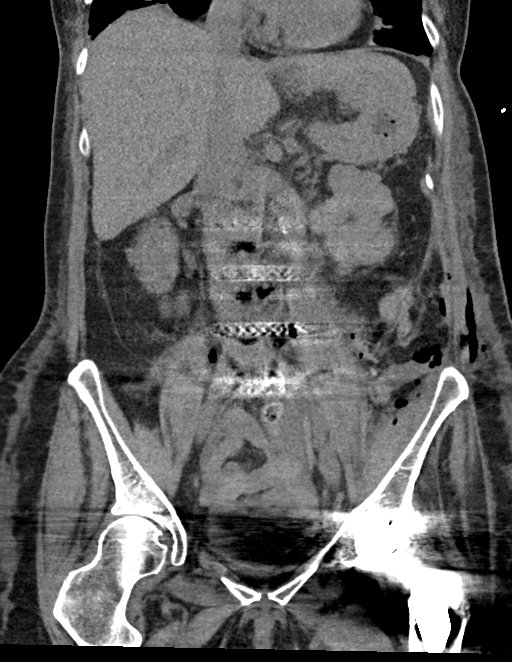
[im 56/101  soft-tissue]
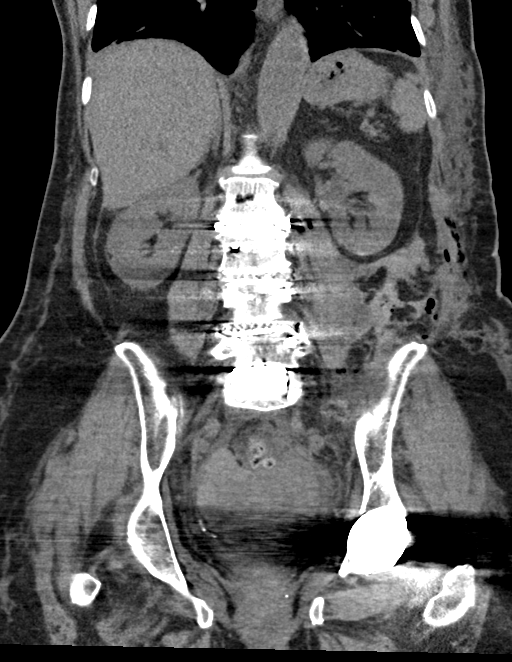

[15 of 46 positions shown; findings below may reference images not displayed]

FINDINGS: Lower chest: Heart top-normal in size. Bronchial wall thickening
with mild bronchiectasis and peribronchial opacities, most evident
on the right, likely all chronic.

Hepatobiliary: No focal liver abnormality is seen. No gallstones,
gallbladder wall thickening, or biliary dilatation.

Pancreas: Unremarkable. No pancreatic ductal dilatation or
surrounding inflammatory changes.

Spleen: Normal in size without focal abnormality.

Adrenals/Urinary Tract: No adrenal mass or hemorrhage.

Kidneys normal in size position. No evidence of a renal contusion or
laceration. No mass, stone or hydronephrosis. Ureters not well
visualized due to postsurgical changes and metallic orthopedic
hardware artifact. No ureteral dilation. Bladder not well
visualized, decompressed with Foley catheter.

Stomach/Bowel: Stomach is unremarkable. Small bowel and colon are
normal caliber. No evidence of a bowel injury. Bowel anastomosis
staples noted along the rectum normal appendix visualized.

Vascular/Lymphatic: Aortic atherosclerotic calcifications. No
aneurysm. No evidence of a vascular injury.

Reproductive: Not well visualized due to artifact from the left hip
arthroplasty. No pelvic masses.

Other: Hemorrhage and air tracks along the left psoas muscle. There
is hemorrhage collecting within the pelvis and irregular hemorrhage
is seen tracking along posterior left pararenal fascia, extending
into the left lower quadrant. Postoperative air lies in the deep
subcutaneous soft tissues of the low anterior abdominal wall and
along the left flank. Soft tissue air and fluid lies posterior to
the spinous processes of the lumbar spine. Air is also seen within
the left retroperitoneum and tracking along both psoas muscles. No
free intraperitoneal air.

Musculoskeletal: S/p fusion, L2 through S1. There are bilateral
pedicle screws and interconnecting rods with the pedicle screws
well-seated and well-positioned. Intervertebral cages are well
centered at the L2-L3, L3-L4, L4-L5 and L5-S1 levels.

There is no evidence of hemorrhage within the spinal canal.
IMPRESSION: 1. There is a small to moderate amount of postoperative hemorrhage
that tracks along the left retroperitoneum, including within the
left psoas muscle, into the pelvis. Post operative air is also noted
along the retroperitoneum, greater on the left, and in the deep
subcutaneous soft tissues and posterior to the lumbar spine. There
is no evidence of hemorrhage within the spinal canal.
2. The orthopedic hardware appears well seated and well-positioned.
3. No evidence injury to the bowel or solid viscera.
4. Lung base findings of bronchiectasis, bronchial wall thickening
and peribronchovascular opacities, which may be chronic. Acute
bronchial infection should be considered if there are consistent
clinical findings.

## 2018-12-31 MED ORDER — GABAPENTIN 300 MG PO CAPS
300.0000 mg | ORAL_CAPSULE | Freq: Three times a day (TID) | ORAL | Status: DC
  Administered 2018-12-31 – 2019-01-05 (×15): 300 mg via ORAL
  Filled 2018-12-31 (×15): qty 1

## 2018-12-31 MED FILL — Sodium Chloride IV Soln 0.9%: INTRAVENOUS | Qty: 1000 | Status: AC

## 2018-12-31 MED FILL — Heparin Sodium (Porcine) Inj 1000 Unit/ML: INTRAMUSCULAR | Qty: 30 | Status: AC

## 2018-12-31 NOTE — Progress Notes (Signed)
Physical Therapy Treatment Patient Details Name: Amanda Conner MRN: [id number] DOB: 09-Nov-1950 Today's Date: 12/31/2018    History of Present Illness 68 y.o. female with s/p L5-S1 ALIF. PMH including Spinal stenosis, lumbar spondylolisthesis, lumbar scoliosis, lumbago, radiculopathy, arthritis, HTN, HLD, hiatal hernia, and GERD.    PT Comments    Pt agreeable to limited treatment session. Performing bed level exercises and requiring moderate assistance for sitting up on edge of bed. Pt unable to transfer to standing or ambulate due to significant pain, requesting to lie back down. RN present to provide pain meds. Will attempt again tomorrow.     Follow Up Recommendations  Home health PT;Supervision for mobility/OOB     Equipment Recommendations  None recommended by PT    Recommendations for Other Services       Precautions / Restrictions Precautions Precautions: Back Precaution Booklet Issued: Yes (comment) Precaution Comments: Pt able to recall 2/3 back precautions and then require 1 visual cue for no lifting Required Braces or Orthoses: Spinal Brace Spinal Brace: Lumbar corset;Applied in sitting position Restrictions Weight Bearing Restrictions: No    Mobility  Bed Mobility Overal bed mobility: Needs Assistance Bed Mobility: Rolling;Sidelying to Sit;Sit to Sidelying Rolling: Min assist Sidelying to sit: Mod assist     Sit to sidelying: Mod assist General bed mobility comments: Pt initially attempting to roll towards right, but then decided to roll towards left with modA for trunk elevation, heavy use of bed rail. BLE elevation back into bed.   Transfers                    Ambulation/Gait                 Stairs             Wheelchair Mobility    Modified Rankin (Stroke Patients Only)       Balance Overall balance assessment: Needs assistance Sitting-balance support: No upper extremity supported;Feet supported Sitting  balance-Leahy Scale: Fair                                      Cognition Arousal/Alertness: Awake/alert Behavior During Therapy: WFL for tasks assessed/performed Overall Cognitive Status: Within Functional Limits for tasks assessed                                        Exercises General Exercises - Lower Extremity Quad Sets: 10 reps;Both;Supine Heel Slides: 10 reps;Both;Supine Hip ABduction/ADduction: 10 reps;Both;Supine    General Comments        Pertinent Vitals/Pain Pain Assessment: Faces Faces Pain Scale: Hurts whole lot Pain Location: incisional, left inner groin Pain Descriptors / Indicators: Constant;Discomfort;Grimacing;Guarding Pain Intervention(s): Monitored during session;Limited activity within patient's tolerance;Patient requesting pain meds-RN notified    Home Living                      Prior Function            PT Goals (current goals can now be found in the care plan section) Acute Rehab PT Goals Patient Stated Goal: "Go home" PT Goal Formulation: With patient Time For Goal Achievement: 01/13/19 Potential to Achieve Goals: Good Progress towards PT goals: Not progressing toward goals - comment(limited by pain)    Frequency    Min 5X/week  PT Plan Current plan remains appropriate    Co-evaluation              AM-PAC PT "6 Clicks" Mobility   Outcome Measure  Help needed turning from your back to your side while in a flat bed without using bedrails?: None Help needed moving from lying on your back to sitting on the side of a flat bed without using bedrails?: A Lot Help needed moving to and from a bed to a chair (including a wheelchair)?: A Little Help needed standing up from a chair using your arms (e.g., wheelchair or bedside chair)?: A Little Help needed to walk in hospital room?: A Little Help needed climbing 3-5 steps with a railing? : A Lot 6 Click Score: 17    End of Session    Activity Tolerance: Patient tolerated treatment well Patient left: with call bell/phone within reach;in bed Nurse Communication: Mobility status PT Visit Diagnosis: Pain;Difficulty in walking, not elsewhere classified (R26.2) Pain - Right/Left: Left Pain - part of body: Leg     Time: 3875-6433 PT Time Calculation (min) (ACUTE ONLY): 14 min  Charges:  $Therapeutic Exercise: 8-22 mins                     Ellamae Sia, PT, DPT Acute Rehabilitation Services Pager [phone] Office [phone]    Willy Eddy 12/31/2018, 5:17 PM

## 2018-12-31 NOTE — Progress Notes (Signed)
Subjective: Patient reports left leg still painful  Objective: Vital signs in last 24 hours: Temp:  [97.5 F (36.4 C)-99.3 F (37.4 C)] 98.8 F (37.1 C) (06/18 0329) Pulse Rate:  [55-111] 105 (06/18 0329) Resp:  [12-31] 17 (06/17 2100) BP: (95-124)/(52-79) 111/68 (06/18 0329) SpO2:  [76 %-99 %] 90 % (06/18 0329)  Intake/Output from previous day: 06/17 0701 - 06/18 0700 In: 2686.3 [P.O.:360; I.V.:1264.3; Blood:1062] Out: 1475 [Urine:1475] Intake/Output this shift: No intake/output data recorded.  Physical Exam: Abdomen remains soft, non-distended.  Left hip flexor weaker today with some quadriceps weakness as well.  Distal left leg strength is full.  Right leg strength also full.  Dressings CDI.  Lab Results: Recent Labs    12/30/18 0233 12/30/18 1633 12/31/18 0445  WBC 6.3  --  9.4  HGB 7.0* 9.7* 9.3*  HCT 20.5* 28.3* 26.7*  PLT 218  --  189   BMET Recent Labs    12/29/18 1513 12/31/18 0445  NA 133* 131*  K 3.4* 3.8  CL  --  99  CO2  --  24  GLUCOSE  --  117*  BUN  --  6*  CREATININE  --  0.58  CALCIUM  --  7.9*    Studies/Results: Dg Lumbar Spine Complete  Result Date: 12/29/2018 CLINICAL DATA:  Back pain EXAM: LUMBAR SPINE - COMPLETE 4+ VIEW COMPARISON:  None. FINDINGS: The patient has undergone lumbar fusion from L2 through S1. The hardware appears intact. There are multilevel interbody spacers. A total of 9 images were submitted. The fluoroscopy time was 6 minutes and 51 seconds. IMPRESSION: Status post lumbar fusion as above. Electronically Signed   By: Constance Holster M.D.   On: 12/29/2018 16:20   Dg C-arm 1-60 Min  Result Date: 12/29/2018 CLINICAL DATA:  Back pain.  Posterior fusion. EXAM: DG C-ARM 61-120 MIN COMPARISON:  None. FLUOROSCOPY TIME:  Fluoroscopy Time:  6 minutes and 51 seconds Number of Acquired Spot Images: 9 FINDINGS: The patient has undergone posterior fusion from L2 through S1. Multilevel interbody spaces are noted. The hardware  appears intact. A total of 9 images were submitted for review. The total fluoroscopy time was 6 minutes and 51 seconds. IMPRESSION: Status post lumbar fusion as above. Electronically Signed   By: Constance Holster M.D.   On: 12/29/2018 16:19   Dg Or Local Abdomen  Result Date: 12/29/2018 CLINICAL DATA:  Status post anterior lumbar interbody fusion. Evaluate for surgical instrument. EXAM: OR LOCAL ABDOMEN COMPARISON:  None. FINDINGS: There is interval anterior lumbar interbody fusion. There is no retained radiopaque surgical instrument. There is no bowel dilatation to suggest obstruction. There is no evidence of pneumoperitoneum, portal venous gas or pneumatosis. There are no pathologic calcifications along the expected course of the ureters. There is a left hip arthroplasty. IMPRESSION: There is interval anterior lumbar interbody fusion. There is no retained radiopaque surgical instrument. These results were called by telephone at the time of interpretation on 12/29/2018 at 10:09 am to Nurse Anderson Malta in [place], who verbally acknowledged these results. Electronically Signed   By: Kathreen Devoid   On: 12/29/2018 10:09    Assessment/Plan: Appropriate bump after transfusion, but left leg pain is worse with weakness.  I will obtain a CT scan to r/o retroperitoneal hematoma.  Continue to observe in stepdown.  Mobilize with PT as tolerated.  Keep Foley in place this am.      LOS: 2 days    Peggyann Shoals, MD 12/31/2018, 7:37 AM

## 2018-12-31 NOTE — Care Management (Signed)
CM spoke with pt regarding HHPT as recommended - pt requested CM to follow back up closer to discharge regarding choice.  CM will follow back up at a later time

## 2018-12-31 NOTE — Progress Notes (Signed)
NAME:  Amanda Conner, MRN:  [id number], DOB:  July 01, 1951, LOS: 2 ADMISSION DATE:  12/29/2018, CONSULTATION DATE:  12/29/18 REFERRING MD:  Vertell Limber - NSGY, CHIEF COMPLAINT:  Post-operative shock   Brief History   Amanda Conner is a 68 y.o. female who was admitted 6/16 for elective anterior retroperitoneal exposure of L5-S1 with L5-S1 anterior lumber interbody fusion, L2-L5 anterolateral lumbar interbody fusion (left), lumbar percutaneous pedicle screw placement L2-S1.  Post operatively had brief hypotension and SVT.  Past Medical History  Spinal stenosis, lumbar spondylolisthesis, lumbar scoliosis, lumbago, radiculopathy, arthritis, HTN, HLD, hiatal hernia, GERD, chronic cough, BCC of the nose.  Significant Hospital Events   6/16 Admit, to OR for anterior retroperitoneal exposure of L5-S1 with L5-S1 anterior lumber interbody fusion, L2-L5 anterolateral lumbar interbody fusion (left), lumbar percutaneous pedicle screw placement L2-S1.  Consults:    Procedures:  ETT 6/16 > 6/16  Significant Diagnostic Tests:  CT abd/pelvis 6/18 >>   Micro Data:  COVID 6/12 >> negative   Antimicrobials:  Vanc 6/16 pre-op  Interim history/subjective:  C/o pain in her low back and difficulty moving her hips.  Hasn't had bowel movement.  Foley remains in place.  Objective   Blood pressure 121/78, pulse (!) 108, temperature 99 F (37.2 C), temperature source Oral, resp. rate 12, height 5\' 4"  (1.626 m), weight 63.6 kg, SpO2 92 %.        Intake/Output Summary (Last 24 hours) at 12/31/2018 1008 Last data filed at 12/31/2018 0800 Gross per 24 hour  Intake 2868.94 ml  Output 1475 ml  Net 1393.94 ml   Filed Weights   12/29/18 0646  Weight: 63.6 kg    Examination:  General - alert Eyes - pupils reactive ENT - no sinus tenderness, no stridor Cardiac - regular rate/rhythm, no murmur Chest - equal breath sounds b/l, no wheezing or rales Abdomen - soft, non tender, + bowel sounds  Extremities - no cyanosis, clubbing, or edema Skin - no rashes Neuro - has sensation in her lower legs b/l and able to move her feet   Resolved Hospital Problem list     Assessment & Plan:   Acute blood loss anemia after surgery. Plan - f/u CBC - transfuse for Hb < 7 or bleeding  S/p L5-S1 fusion anterior approach, L2-L5 anterolateral lumbar interbody fusion. Plan - concern for retroperitoneal hematoma >> f/u CT abdomen - keeping foley in - bowel regimen - pain control per neurosurgery  Hx of HTN, HLD. Plan - hold outpt lisinopril, HCTZ for now  Hx of glaucoma. Plan - continue eyedrops  Hx of GERD. Plan - continue protonix  Best practice:  Diet: heart healthy diet DVT prophylaxis: SCD GI prophylaxis: protonix Mobility: bedrest  Code Status: Full  Disposition: SDU  Labs    CMP Latest Ref Rng & Units 12/31/2018 12/29/2018 12/29/2018  Glucose 70 - 99 mg/dL 117(H) - -  BUN 8 - 23 mg/dL 6(L) - -  Creatinine 0.44 - 1.00 mg/dL 0.58 - -  Sodium 135 - 145 mmol/L 131(L) 133(L) 132(L)  Potassium 3.5 - 5.1 mmol/L 3.8 3.4(L) 3.6  Chloride 98 - 111 mmol/L 99 - -  CO2 22 - 32 mmol/L 24 - -  Calcium 8.9 - 10.3 mg/dL 7.9(L) - -   CBC Latest Ref Rng & Units 12/31/2018 12/30/2018 12/30/2018  WBC 4.0 - 10.5 K/uL 9.4 - 6.3  Hemoglobin 12.0 - 15.0 g/dL 9.3(L) 9.7(L) 7.0(L)  Hematocrit 36.0 - 46.0 % 26.7(L) 28.3(L) 20.5(L)  Platelets 150 -  [place], MD [place] 12/31/2018, 10:16 AM

## 2018-12-31 NOTE — Progress Notes (Signed)
   I have seen and evaluate the patient and reviewed the CT scan.  I do not see any evidence for return to the operating room at this time.  We will be available as necessary.  Brandon C. Donzetta Matters, MD [facility] Office: [phone] Pager: [phone]

## 2018-12-31 NOTE — Progress Notes (Signed)
I have reviewed patient's scan.  She has some psoas hematoma and retroperitoneal blood.  This accounts for her increased leg weakness and decrease in hemoglobin.  There does not appear to be injury to viscus or other organs.  This appears to be related to lateral approach and not to anterior portion of surgery.  Patient's hardware and spinal correction all looks good.  I explained situation to patient and her husband.  She is being seen by General Surgery and also by Vascular.  I will plan on rechecking CBC in am to make sure there is no more bleeding.  Patient is OK to mobilize as tolerated with PT.  Foley can be removed when she is more mobile.

## 2018-12-31 NOTE — Progress Notes (Signed)
OT Cancellation Note  Patient Details Name: Amanda Conner MRN: [id number] DOB: Feb 01, 1951   Cancelled Treatment:    Reason Eval/Treat Not Completed: Pain limiting ability to participate.  Malka So 12/31/2018, 11:44 AM  Nestor Lewandowsky, OTR/L Acute Rehabilitation Services Pager: [phone] Office: [phone]

## 2018-12-31 NOTE — Consult Note (Signed)
[facility] Surgery Consult Note  Amanda Conner January 28, 1951  [id number].    Requesting MD: Erline Levine Chief Complaint: Lower back and right lower extremity pain Reason for Consult: Postop left lower extremity pain/weakness   HPI: Patient is a 68 year old female who was admitted with history of leg and back pain.  MRI showed right lower curvature with apex at L3-4; Mild right foraminal narrowing at L5-S1 is stable. Mild foraminal narrowing is present bilaterally at L2-3, L3-4, and L4-5.  She underwent the procedure described below by Dr. Vertell Limber on 12/29/2018.  Patient had anterior exposure by Dr. [name].  Postop patient had a run of SVT hypotension, and hemoglobin dropped to 8.5, preop hemoglobin of 12.6.  She was placed in the ICU and CCM consult was obtained.    Patient was unable to stand with any stability yesterday because of severe pain in her left leg.  Had been transfused and was off pressors at this point.  Her SVT had resolved.  She is developed progressive pain mid lower back and down her left lower extremity.  She continues to complain of diffuse pain in her back and left lower extremity.  Currently she is hemodynamically stable.    A CT was obtained to evaluate for retroperitoneal hematoma. CT scan shows small to moderate amount of postoperative hemorrhage that tracks along the retroperitoneum including within the left psoas muscle and into the pelvis.  This operative air is noted along the retroperitoneum, greater on the left and in the deep subcutaneous soft tissues and posterior to the lumbar spine.  No evidence of hemorrhage within the spinal canal.  Orthopedic hardware appeared to be well seated and well positioned.  No evidence of injury to the bowel or solid viscera.  We are asked to see.  She has had prior abdominal plastic surgery and colon resection for diverticulitis 18/5631, and umbilical hernia repair.    ROS: Review of Systems  Constitutional:  Negative.   HENT: Negative.   Eyes: Negative.   Respiratory: Negative.   Cardiovascular: Negative.   Gastrointestinal: Positive for abdominal pain (left side). Negative for blood in stool, constipation, diarrhea, heartburn, melena, nausea and vomiting.       Colonoscopy within the last 4 years, she had her colon surgery at [facility]  Genitourinary: Negative.        Foley in   Musculoskeletal: Positive for back pain.  Skin: Negative.   Neurological: Negative.   Endo/Heme/Allergies: Negative.   Psychiatric/Behavioral: Negative.     Family History  Problem Relation Age of Onset  . Hypertension Mother   . COPD Mother   . Hypertension Father   . Heart disease Father     Past Medical History:  Diagnosis Date  . Arthritis    osteoarthritis  . Back pain   . Bronchiectasis ([place])   . Cancer (HCC)    basal cell nose  . Chronic cough   . Complication of anesthesia   . Dyspnea   . Gastrointestinal symptoms   . GERD (gastroesophageal reflux disease)   . Glaucoma   . History of hiatal hernia   . Hyperlipemia   . Hypertension   . Osteopenia   . Pneumonia   . PONV (postoperative nausea and vomiting)    "only one time"  . Spondylisthesis     Past Surgical History:  Procedure Laterality Date  . ABDOMINAL EXPOSURE N/A 12/29/2018   Procedure: ABDOMINAL EXPOSURE;  Surgeon: Angelia Mould, MD;  Location: [place];  Service: Vascular;  Laterality: N/A;  . ABDOMINOPLASTY  2002  . ABDOMINOPLASTY  1970's  . ANKLE SURGERY  10/2015  . ANTERIOR LATERAL LUMBAR FUSION WITH PERCUTANEOUS SCREW 3 LEVEL Left 12/29/2018   Procedure: Lumbar Two to Lumbar Five Anterolateral lumbar interbody fusion;  Surgeon: Erline Levine, MD;  Location: [place];  Service: Neurosurgery;  Laterality: Left;  anterolateral  . ANTERIOR LUMBAR FUSION N/A 12/29/2018   Procedure: Lumbar Five Sacral One Anterior lumbar interbody fusion;  Surgeon: Erline Levine, MD;  Location: [place];  Service: Neurosurgery;  Laterality: N/A;   anterior approach  . BASAL CELL CARCINOMA EXCISION  06/2018   nose  . BLEPHAROPLASTY Bilateral 1999  . BREAST ENHANCEMENT SURGERY  1970's  . COLONOSCOPY    . CYSTOURETHROSCOPY  2018   [place] J ureteal stents  . DECOMPRESSION CORE HIP Left 2014  . FACIAL COSMETIC SURGERY  2004  . FLUOROSCOPY GUIDANCE    . HIP ARTHROPLASTY Left   . JOINT REPLACEMENT Left    05/2014  . LAPAROSCOPIC PARTIAL COLECTOMY  06/26/2017  . LUMBAR PERCUTANEOUS PEDICLE SCREW 4 LEVEL N/A 12/29/2018   Procedure: Lumbar Percutaneous Pedicle Screw Placement Lumbar two-Sacral one;  Surgeon: Erline Levine, MD;  Location: [place];  Service: Neurosurgery;  Laterality: N/A;  . MOHS SURGERY  2019   nose  . PARTIAL COLECTOMY  06/2017   for diverticulitis  . REMOVAL OF BILATERAL TISSUE EXPANDERS WITH PLACEMENT OF BILATERAL BREAST IMPLANTS  2004  . THIGH LIFT  1994  . TOE FUSION Right    great toe  . TONSILLECTOMY    . UMBILICAL HERNIA REPAIR     with tummy tuck  . UMBILICAL HERNIA REPAIR  2002    Social History:  reports that she has never smoked. She has never used smokeless tobacco. She reports current alcohol use. She reports that she does not use drugs.  Allergies:  Allergies  Allergen Reactions  . Amikacin Other (See Comments)    Eosinophilia with chills and aching when given with cefoxitin  . Biaxin [Clarithromycin] Itching  . Cefoxitin Other (See Comments)    Eosinophlia when given with amikacin  . Sulfamethoxazole-Trimethoprim Itching  . Bacitracin-Polymyxin B Rash    Medications Prior to Admission  Medication Sig Dispense Refill  . albuterol (VENTOLIN HFA) 108 (90 Base) MCG/ACT inhaler Inhale 2 puffs into the lungs every 6 (six) hours as needed for wheezing or shortness of breath.    . Biotin 1000 MCG tablet Take 1,000 mcg by mouth daily.    . Calcium Carb-Cholecalciferol (CALCIUM 600/VITAMIN D3) 600-800 MG-UNIT TABS Take 1 tablet by mouth daily.    . cyclobenzaprine (FLEXERIL) 10 MG tablet Take 10 mg by  mouth 3 (three) times daily as needed for muscle spasms.     Marland Kitchen dextromethorphan-guaiFENesin (MUCINEX DM) 30-600 MG 12hr tablet Take 1 tablet by mouth daily.     Marland Kitchen estrogen, conjugated,-medroxyprogesterone (PREMPRO) 0.45-1.5 MG tablet Take 1 tablet by mouth daily.    . fluticasone (FLONASE) 50 MCG/ACT nasal spray Place 1 spray into both nostrils daily.    Marland Kitchen latanoprost (XALATAN) 0.005 % ophthalmic solution Place 1 drop into both eyes at bedtime.     Marland Kitchen lisinopril-hydrochlorothiazide (PRINZIDE,ZESTORETIC) 20-25 MG tablet Take 1 tablet by mouth daily.    Marland Kitchen loratadine (CLARITIN) 10 MG tablet Take 10 mg by mouth daily.    . pantoprazole (PROTONIX) 40 MG tablet Take 40 mg by mouth daily.    . SUMAtriptan (IMITREX) 100 MG tablet Take 100 mg by mouth every 2 (  two) hours as needed for migraine. May repeat in 2 hours if headache persists or recurs.      Blood pressure 121/78, pulse (!) 108, temperature 99 F (37.2 C), temperature source Oral, resp. rate 12, height 5\' 4"  (1.626 m), weight 63.6 kg, SpO2 92 %. Physical Exam: Physical Exam Constitutional:      General: She is not in acute distress.    Appearance: Normal appearance. She is not ill-appearing, toxic-appearing or diaphoretic.  HENT:     Head: Normocephalic and atraumatic.     Nose: No congestion.     Mouth/Throat:     Pharynx: Oropharynx is clear. No oropharyngeal exudate or posterior oropharyngeal erythema.  Eyes:     General: No scleral icterus.    Conjunctiva/sclera: Conjunctivae normal.     Comments: Pupils are equal  Neck:     Musculoskeletal: Normal range of motion and neck supple.     Comments: Scar right neck from port Cardiovascular:     Rate and Rhythm: Normal rate and regular rhythm.     Pulses: Normal pulses.     Heart sounds: Normal heart sounds. No murmur.  Pulmonary:     Effort: Pulmonary effort is normal. No respiratory distress.     Breath sounds: Normal breath sounds. No stridor. No wheezing, rhonchi or rales.   Chest:     Chest wall: No tenderness.  Abdominal:     General: Abdomen is flat.     Palpations: Abdomen is soft.     Comments: BS present but hypoactive Incision is dressed and dry, looks good Pain is in the LLQ  Musculoskeletal:        General: No swelling, tenderness (Hurts to raise and limited raise of her left legl), deformity or signs of injury.     Right lower leg: No edema.     Left lower leg: No edema.  Skin:    General: Skin is warm and dry.     Capillary Refill: Capillary refill takes less than 2 seconds.  Neurological:     General: No focal deficit present.     Mental Status: She is alert and oriented to person, place, and time.     Cranial Nerves: No cranial nerve deficit.     Comments: She has pain LLQ and with movement of her left leg  Psychiatric:        Mood and Affect: Mood normal.        Behavior: Behavior normal.        Thought Content: Thought content normal.        Judgment: Judgment normal.     Results for orders placed or performed during the hospital encounter of 12/29/18 (from the past 48 hour(s))  I-STAT 7, (LYTES, BLD GAS, ICA, H+H)     Status: Abnormal   Collection Time: 12/29/18 11:30 AM  Result Value Ref Range   pH, Arterial 7.427 7.350 - 7.450   pCO2 arterial 42.2 32.0 - 48.0 mmHg   pO2, Arterial 316.0 (H) 83.0 - 108.0 mmHg   Bicarbonate 27.8 20.0 - 28.0 mmol/L   TCO2 29 22 - 32 mmol/L   O2 Saturation 100.0 %   Acid-Base Excess 3.0 (H) 0.0 - 2.0 mmol/L   Sodium 132 (L) 135 - 145 mmol/L   Potassium 3.6 3.5 - 5.1 mmol/L   Calcium, Ion 1.16 1.15 - 1.40 mmol/L   HCT 32.0 (L) 36.0 - 46.0 %   Hemoglobin 10.9 (L) 12.0 - 15.0 g/dL   Patient temperature HIDE  Sample type ARTERIAL   I-STAT 7, (LYTES, BLD GAS, ICA, H+H)     Status: Abnormal   Collection Time: 12/29/18  3:13 PM  Result Value Ref Range   pH, Arterial 7.428 7.350 - 7.450   pCO2 arterial 38.8 32.0 - 48.0 mmHg   pO2, Arterial 177.0 (H) 83.0 - 108.0 mmHg   Bicarbonate 25.7 20.0 -  28.0 mmol/L   TCO2 27 22 - 32 mmol/L   O2 Saturation 100.0 %   Acid-Base Excess 1.0 0.0 - 2.0 mmol/L   Sodium 133 (L) 135 - 145 mmol/L   Potassium 3.4 (L) 3.5 - 5.1 mmol/L   Calcium, Ion 1.15 1.15 - 1.40 mmol/L   HCT 25.0 (L) 36.0 - 46.0 %   Hemoglobin 8.5 (L) 12.0 - 15.0 g/dL   Patient temperature HIDE    Sample type ARTERIAL   CBC with Differential/Platelet     Status: Abnormal   Collection Time: 12/30/18  2:33 AM  Result Value Ref Range   WBC 6.3 4.0 - 10.5 K/uL   RBC 2.40 (L) 3.87 - 5.11 MIL/uL   Hemoglobin 7.0 (L) 12.0 - 15.0 g/dL   HCT 20.5 (L) 36.0 - 46.0 %   MCV 85.4 80.0 - 100.0 fL   MCH 29.2 26.0 - 34.0 pg   MCHC 34.1 30.0 - 36.0 g/dL   RDW 14.5 11.5 - 15.5 %   Platelets 218 150 - 400 K/uL    Comment: REPEATED TO VERIFY SPECIMEN CHECKED FOR CLOTS    nRBC 0.0 0.0 - 0.2 %   Neutrophils Relative % 69 %   Neutro Abs 4.4 1.7 - 7.7 K/uL   Lymphocytes Relative 22 %   Lymphs Abs 1.4 0.7 - 4.0 K/uL   Monocytes Relative 9 %   Monocytes Absolute 0.6 0.1 - 1.0 K/uL   Eosinophils Relative 0 %   Eosinophils Absolute 0.0 0.0 - 0.5 K/uL   Basophils Relative 0 %   Basophils Absolute 0.0 0.0 - 0.1 K/uL   Immature Granulocytes 0 %   Abs Immature Granulocytes 0.02 0.00 - 0.07 K/uL    Comment: Performed at [facility], [address], [place], [place] [address]  Prepare RBC     Status: None   Collection Time: 12/30/18  7:43 AM  Result Value Ref Range   Order Confirmation      ORDER PROCESSED BY BLOOD BANK Performed at [facility], [place] [address] [place],  [address]   Hemoglobin and hematocrit, blood     Status: Abnormal   Collection Time: 12/30/18  4:33 PM  Result Value Ref Range   Hemoglobin 9.7 (L) 12.0 - 15.0 g/dL    Comment: REPEATED TO VERIFY POST TRANSFUSION SPECIMEN    HCT 28.3 (L) 36.0 - 46.0 %    Comment: Performed at [facility] [address], [place], [place] [address]  CBC     Status: Abnormal   Collection Time: 12/31/18  4:45  AM  Result Value Ref Range   WBC 9.4 4.0 - 10.5 K/uL   RBC 3.04 (L) 3.87 - 5.11 MIL/uL   Hemoglobin 9.3 (L) 12.0 - 15.0 g/dL   HCT 26.7 (L) 36.0 - 46.0 %   MCV 87.8 80.0 - 100.0 fL   MCH 30.6 26.0 - 34.0 pg   MCHC 34.8 30.0 - 36.0 g/dL   RDW 14.6 11.5 - 15.5 %   Platelets 189 150 - 400 K/uL   nRBC 0.0 0.0 - 0.2 %  Comment: Performed at [facility], [place] [address], [place], [place] [address]  Basic metabolic panel     Status: Abnormal   Collection Time: 12/31/18  4:45 AM  Result Value Ref Range   Sodium 131 (L) 135 - 145 mmol/L   Potassium 3.8 3.5 - 5.1 mmol/L   Chloride 99 98 - 111 mmol/L   CO2 24 22 - 32 mmol/L   Glucose, Bld 117 (H) 70 - 99 mg/dL   BUN 6 (L) 8 - 23 mg/dL   Creatinine, Ser 0.58 0.44 - 1.00 mg/dL   Calcium 7.9 (L) 8.9 - 10.3 mg/dL   GFR calc non Af Amer >60 >60 mL/min   GFR calc Af Amer >60 >60 mL/min   Anion gap 8 5 - 15    Comment: Performed at [facility], [place] [address], [place], [place] [address]   Dg Lumbar Spine Complete  Result Date: 12/29/2018 CLINICAL DATA:  Back pain EXAM: LUMBAR SPINE - COMPLETE 4+ VIEW COMPARISON:  None. FINDINGS: The patient has undergone lumbar fusion from L2 through S1. The hardware appears intact. There are multilevel interbody spacers. A total of 9 images were submitted. The fluoroscopy time was 6 minutes and 51 seconds. IMPRESSION: Status post lumbar fusion as above. Electronically Signed   By: Constance Holster M.D.   On: 12/29/2018 16:20   Dg C-arm 1-60 Min  Result Date: 12/29/2018 CLINICAL DATA:  Back pain.  Posterior fusion. EXAM: DG C-ARM 61-120 MIN COMPARISON:  None. FLUOROSCOPY TIME:  Fluoroscopy Time:  6 minutes and 51 seconds Number of Acquired Spot Images: 9 FINDINGS: The patient has undergone posterior fusion from L2 through S1. Multilevel interbody spaces are noted. The hardware appears intact. A total of 9 images were submitted for review. The total fluoroscopy time was 6 minutes and 51 seconds.  IMPRESSION: Status post lumbar fusion as above. Electronically Signed   By: Constance Holster M.D.   On: 12/29/2018 16:19      Assessment/Plan Hx hypertension Hx hiatal hernia/GERD Hx hyperlipidemia  LLQ and LLE pain  small to moderate post op hemorrhage left psoas, left retroperitoneum/pelvis  Spinal stenosis of lumbosacral region, lumbar spondylolisthesis, lumbar scoliosis, lumbago, radiculopathy L 2 - S 1 levels S/P Lumbar Five Sacral One Anterior lumbar interbody fusion - anterior approach Lumbar Two to Lumbar Five Anterolateral lumbar interbody fusion (Left) - anterolateral ABDOMINAL EXPOSURE, Lumbar Percutaneous Pedicle Screw Placement Lumbar two-Sacral one 12/29/18, Dr. Erline Levine  Plan:  Dr. Ninfa Linden will review, but I do not see any surgical issue. Stomach, large and small bowel, bowel anastomosis are fine.  She has some stool in the right colon, but no BM so far post op. The hemorrhage will need to resolve over time.  Earnstine Regal, Gay PA-C  [facility] [phone]  12/31/2018 10:12 AM

## 2018-12-31 NOTE — Progress Notes (Signed)
Patient ID: Amanda Conner, female   DOB: 20-Mar-1951, 68 y.o.   MRN: [id number] Alert, conversant, reporting persistent left groin and thigh pain with movement. LLQ tender to palpation, soft, nondistended. Passive left leg ROM elicits minimal pain. She hesitates to cooperate with exam due to pain. She also reports difficulty with sleep and requests sleep aid.  Per Dr. Vertell Limber, start Gabapentin 300mg  po tid, for radicular pain, may also help sleep.  Encouraged participation with therapies; reassuring of CT results. Low o/p documented? overnight. Urine clear yellow. Will monitor.

## 2018-12-31 NOTE — Progress Notes (Signed)
Pt was noted tobe sleeping forr most of the shift. She was easy to rouse and could answer questions appropriately, but would go directly back to sleep. She is now stating she has had no relief from pain. She is currently rating pain 7/10 at the mid/lower back that radiates down LLE. Pain meds and ice pack administered. Will continue to monitor. Katherina Right RN

## 2019-01-01 ENCOUNTER — Encounter (HOSPITAL_COMMUNITY): Payer: Self-pay | Admitting: Neurosurgery

## 2019-01-01 LAB — CBC
HCT: 28.6 % — ABNORMAL LOW (ref 36.0–46.0)
Hemoglobin: 9.8 g/dL — ABNORMAL LOW (ref 12.0–15.0)
MCH: 30.4 pg (ref 26.0–34.0)
MCHC: 34.3 g/dL (ref 30.0–36.0)
MCV: 88.8 fL (ref 80.0–100.0)
Platelets: 210 10*3/uL (ref 150–400)
RBC: 3.22 MIL/uL — ABNORMAL LOW (ref 3.87–5.11)
RDW: 14.8 % (ref 11.5–15.5)
WBC: 12 10*3/uL — ABNORMAL HIGH (ref 4.0–10.5)
nRBC: 0 % (ref 0.0–0.2)

## 2019-01-01 NOTE — Care Management Important Message (Signed)
Important Message  Patient Details  Name: Amanda Conner MRN: [id number] Date of Birth: Feb 12, 1951   Medicare Important Message Given:  Yes     Memory Argue 01/01/2019, 4:17 PM

## 2019-01-01 NOTE — Progress Notes (Addendum)
Subjective: Patient reports "I think that medicine helped. My thigh pain is much better, just some abdominal pain "  Objective: Vital signs in last 24 hours: Temp:  [97.7 F (36.5 C)-98.9 F (37.2 C)] 97.9 F (36.6 C) (06/19 0753) Pulse Rate:  [105-113] 105 (06/18 2345) Resp:  [18-21] 20 (06/18 2345) BP: (100-121)/(71-76) 100/71 (06/18 2345) SpO2:  [92 %-94 %] 92 % (06/18 2345)  Intake/Output from previous day: 06/18 0701 - 06/19 0700 In: 2312.7 [P.O.:240; I.V.:2072.7] Out: 375 [Urine:375] Intake/Output this shift: Total I/O In: -  Out: 1100 [Urine:1100]  Alert, smiling. Reports much improved left thigh pain, voicing hopes to work with PT more this am. Persistent abdominal discomfort without distention. Hgb up to 9.8, Hct up to 28.6.  Lab Results: Recent Labs    12/31/18 0445 01/01/19 0453  WBC 9.4 12.0*  HGB 9.3* 9.8*  HCT 26.7* 28.6*  PLT 189 210   BMET Recent Labs    12/29/18 1513 12/31/18 0445  NA 133* 131*  K 3.4* 3.8  CL  --  99  CO2  --  24  GLUCOSE  --  117*  BUN  --  6*  CREATININE  --  0.58  CALCIUM  --  7.9*    Studies/Results: Ct Abdomen Pelvis Wo Contrast  Result Date: 12/31/2018 CLINICAL DATA:  Lumbar spine surgery 2 days ago. Anterior posterior incisions. Patient complaining of diffuse pain. EXAM: CT ABDOMEN AND PELVIS WITHOUT CONTRAST TECHNIQUE: Multidetector CT imaging of the abdomen and pelvis was performed following the standard protocol without IV contrast. COMPARISON:  None. FINDINGS: Lower chest: Heart top-normal in size. Bronchial wall thickening with mild bronchiectasis and peribronchial opacities, most evident on the right, likely all chronic. Hepatobiliary: No focal liver abnormality is seen. No gallstones, gallbladder wall thickening, or biliary dilatation. Pancreas: Unremarkable. No pancreatic ductal dilatation or surrounding inflammatory changes. Spleen: Normal in size without focal abnormality. Adrenals/Urinary Tract: No adrenal mass  or hemorrhage. Kidneys normal in size position. No evidence of a renal contusion or laceration. No mass, stone or hydronephrosis. Ureters not well visualized due to postsurgical changes and metallic orthopedic hardware artifact. No ureteral dilation. Bladder not well visualized, decompressed with Foley catheter. Stomach/Bowel: Stomach is unremarkable. Small bowel and colon are normal caliber. No evidence of a bowel injury. Bowel anastomosis staples noted along the rectum normal appendix visualized. Vascular/Lymphatic: Aortic atherosclerotic calcifications. No aneurysm. No evidence of a vascular injury. Reproductive: Not well visualized due to artifact from the left hip arthroplasty. No pelvic masses. Other: Hemorrhage and air tracks along the left psoas muscle. There is hemorrhage collecting within the pelvis and irregular hemorrhage is seen tracking along posterior left pararenal fascia, extending into the left lower quadrant. Postoperative air lies in the deep subcutaneous soft tissues of the low anterior abdominal wall and along the left flank. Soft tissue air and fluid lies posterior to the spinous processes of the lumbar spine. Air is also seen within the left retroperitoneum and tracking along both psoas muscles. No free intraperitoneal air. Musculoskeletal: S/p fusion, L2 through S1. There are bilateral pedicle screws and interconnecting rods with the pedicle screws well-seated and well-positioned. Intervertebral cages are well centered at the L2-L3, L3-L4, L4-L5 and L5-S1 levels. There is no evidence of hemorrhage within the spinal canal. IMPRESSION: 1. There is a small to moderate amount of postoperative hemorrhage that tracks along the left retroperitoneum, including within the left psoas muscle, into the pelvis. Post operative air is also noted along the retroperitoneum, greater on the  left, and in the deep subcutaneous soft tissues and posterior to the lumbar spine. There is no evidence of hemorrhage  within the spinal canal. 2. The orthopedic hardware appears well seated and well-positioned. 3. No evidence injury to the bowel or solid viscera. 4. Lung base findings of bronchiectasis, bronchial wall thickening and peribronchovascular opacities, which may be chronic. Acute bronchial infection should be considered if there are consistent clinical findings. Electronically Signed   By: Lajean Manes M.D.   On: 12/31/2018 10:22    Assessment/Plan: Improving  LOS: 3 days  Mobilize with PT.    PoteatAaron Edelman 01/01/2019, 8:40 AM   CBC improved.  Patient is feeling better.  We will pursue more PT.

## 2019-01-01 NOTE — Progress Notes (Signed)
Physical Therapy Treatment Patient Details Name: Amanda Conner MRN: [id number] DOB: 1951-06-02 Today's Date: 01/01/2019    History of Present Illness 68 y.o. female with s/p L5-S1 ALIF. PMH including Spinal stenosis, lumbar spondylolisthesis, lumbar scoliosis, lumbago, radiculopathy, arthritis, HTN, HLD, hiatal hernia, and GERD.    PT Comments    Pt making slow progress towards therapy goals. Ambulating 30 feet with walker and min guard assist. Antalgic gait pattern with decreased left lower extremity weightbearing. Continued LLE pain and "spasm," through proximal groin/thigh. Will continue to progress as tolerated.     Follow Up Recommendations  Home health PT;Supervision for mobility/OOB     Equipment Recommendations  None recommended by PT    Recommendations for Other Services       Precautions / Restrictions Precautions Precautions: Back Precaution Booklet Issued: Yes (comment) Precaution Comments: Pt able to recall 2/3 back precautions and then require 1 visual cue for no lifting Required Braces or Orthoses: Spinal Brace Spinal Brace: Lumbar corset;Applied in sitting position Restrictions Weight Bearing Restrictions: No    Mobility  Bed Mobility Overal bed mobility: Needs Assistance Bed Mobility: Rolling;Sidelying to Sit;Sit to Sidelying Rolling: Min guard Sidelying to sit: Mod assist       General bed mobility comments: Rolling towards left, modA for trunk elevation. Increased time/effort required  Transfers Overall transfer level: Needs assistance Equipment used: Rolling walker (2 wheeled) Transfers: Sit to/from Stand Sit to Stand: Min guard         General transfer comment: From elevated bed surface, pt preferring to pull up on walker  Ambulation/Gait Ambulation/Gait assistance: Min guard Gait Distance (Feet): 30 Feet Assistive device: Rolling walker (2 wheeled) Gait Pattern/deviations: Antalgic;Decreased weight shift to left;Decreased stance  time - left;Step-through pattern Gait velocity: decreased   General Gait Details: Heavy reliance on arms, antalgic gait pattern with decreased weightbearing on LLE   Stairs             Wheelchair Mobility    Modified Rankin (Stroke Patients Only)       Balance Overall balance assessment: Needs assistance Sitting-balance support: No upper extremity supported;Feet supported Sitting balance-Leahy Scale: Fair     Standing balance support: Bilateral upper extremity supported;During functional activity Standing balance-Leahy Scale: Poor Standing balance comment: Reliant on UE support                            Cognition Arousal/Alertness: Awake/alert Behavior During Therapy: WFL for tasks assessed/performed Overall Cognitive Status: Within Functional Limits for tasks assessed                                        Exercises      General Comments        Pertinent Vitals/Pain Pain Assessment: Faces Faces Pain Scale: Hurts even more Pain Location: incisional, left inner groin, left thigh Pain Descriptors / Indicators: Constant;Discomfort;Grimacing;Guarding;Spasm    Home Living                      Prior Function            PT Goals (current goals can now be found in the care plan section) Acute Rehab PT Goals Patient Stated Goal: "Go home" PT Goal Formulation: With patient Time For Goal Achievement: 01/13/19 Potential to Achieve Goals: Good Progress towards PT goals: Progressing toward goals  Frequency    Min 5X/week      PT Plan Current plan remains appropriate    Co-evaluation              AM-PAC PT "6 Clicks" Mobility   Outcome Measure  Help needed turning from your back to your side while in a flat bed without using bedrails?: None Help needed moving from lying on your back to sitting on the side of a flat bed without using bedrails?: A Lot Help needed moving to and from a bed to a chair  (including a wheelchair)?: A Little Help needed standing up from a chair using your arms (e.g., wheelchair or bedside chair)?: A Little Help needed to walk in hospital room?: A Little Help needed climbing 3-5 steps with a railing? : A Lot 6 Click Score: 17    End of Session Equipment Utilized During Treatment: Gait belt;Back brace Activity Tolerance: Patient tolerated treatment well Patient left: with call bell/phone within reach;in chair Nurse Communication: Mobility status PT Visit Diagnosis: Pain;Difficulty in walking, not elsewhere classified (R26.2) Pain - Right/Left: Left Pain - part of body: Leg     Time: 3539-1225 PT Time Calculation (min) (ACUTE ONLY): 21 min  Charges:  $Therapeutic Activity: 8-22 mins                     Amanda Conner, PT, DPT Acute Rehabilitation Services Pager [phone] Office [phone]    Willy Eddy 01/01/2019, 5:18 PM

## 2019-01-01 NOTE — TOC Initial Note (Signed)
Transition of Care (TOC) - Initial/Assessment Note  Marvetta Gibbons RN,BSN Transitions of Care Cross Coverage for 4NP - RN Case Manager [phone]   Patient Details  Name: Amanda Conner MRN: [id number] Date of Birth: 05/06/1951  Transition of Care [facility]) CM/SW Contact:    Dawayne Patricia, RN Phone Number: 01/01/2019, 12:46 PM  Clinical Narrative:                 Pt s/p L5-S1 ALIF, per PT/OT evals recommendations for [facility] services- Cm spoke with pt at bedside- pt states she has been doing outpt therapy but would prefer HH this time. List provided to pt for [facility] choice- Per CMS guidelines from medicare.gov website with star ratings (copy placed in shadow chart)- pt states she would like time to look over list- CM will f/u for choice and make referral- Pt will need HH orders and [facility] prior to discharge. Pt also states she would like to think about a 3n1 and will let CM know if she need that DME, reports she has RW at home. Pt will have transportation home.   Expected Discharge Plan: [place] Barriers to Discharge: Continued Medical Work up   Patient Goals and CMS Choice Patient states their goals for this hospitalization and ongoing recovery are:: "to get rid of the pain" CMS Medicare.gov Compare Post Acute Care list provided to:: Patient Choice offered to / list presented to : Patient  Expected Discharge Plan and Services Expected Discharge Plan: [place]   Discharge Planning Services: CM Consult Post Acute Care Choice: Durable Medical Equipment, Home Health Living arrangements for the past 2 months: Single Family Home Expected Discharge Date: 01/02/19                                    Prior Living Arrangements/Services Living arrangements for the past 2 months: Single Family Home Lives with:: Spouse Patient language and need for interpreter reviewed:: Yes Do you feel safe going back to the place where you live?: Yes       Need for Family Participation in Patient Care: Yes (Comment) Care giver support system in place?: Yes (comment) Current home services: DME Criminal Activity/Legal Involvement Pertinent to Current Situation/Hospitalization: No - Comment as needed  Activities of Daily Living Home Assistive Devices/Equipment: Eyeglasses, Shower chair without back ADL Screening (condition at time of admission) Patient's cognitive ability adequate to safely complete daily activities?: Yes Is the patient deaf or have difficulty hearing?: No Does the patient have difficulty seeing, even when wearing glasses/contacts?: No Does the patient have difficulty concentrating, remembering, or making decisions?: No Patient able to express need for assistance with ADLs?: Yes Does the patient have difficulty dressing or bathing?: No Independently performs ADLs?: Yes (appropriate for developmental age) Does the patient have difficulty walking or climbing stairs?: Yes Weakness of Legs: None Weakness of Arms/Hands: None  Permission Sought/Granted Permission sought to share information with : Case Manager, Chartered certified accountant granted to share information with : Yes, Verbal Permission Granted              Emotional Assessment Appearance:: Appears stated age Attitude/Demeanor/Rapport: Engaged Affect (typically observed): Appropriate, Pleasant Orientation: : Oriented to Self, Oriented to Place, Oriented to  Time, Oriented to Situation Alcohol / Substance Use: Not Applicable Psych Involvement: No (comment)  Admission diagnosis:  Lumbar scoliosis [M41.9] Patient Active Problem List   Diagnosis  Date Noted  . Lumbar scoliosis 12/29/2018   PCP:  Yvone Neu, MD Pharmacy:   [id number] - [place], [place] [address] Phone: [phone] Fax: [phone]     Social Determinants of Health (SDOH)  Interventions    Readmission Risk Interventions No flowsheet data found.

## 2019-01-02 LAB — EXPECTORATED SPUTUM ASSESSMENT W GRAM STAIN, RFLX TO RESP C

## 2019-01-02 MED ORDER — CIPROFLOXACIN HCL 500 MG PO TABS
500.0000 mg | ORAL_TABLET | Freq: Two times a day (BID) | ORAL | Status: DC
  Administered 2019-01-02 – 2019-01-05 (×7): 500 mg via ORAL
  Filled 2019-01-02 (×7): qty 1

## 2019-01-02 NOTE — Progress Notes (Signed)
Physical Therapy Treatment Patient Details Name: Amanda Conner MRN: [id number] DOB: 01/03/1951 Today's Date: 01/02/2019    History of Present Illness 68 y.o. female with s/p L5-S1 ALIF. PMH including Spinal stenosis, lumbar spondylolisthesis, lumbar scoliosis, lumbago, radiculopathy, arthritis, HTN, HLD, hiatal hernia, and GERD.    PT Comments    Pt progressing slowly towards her physical therapy goals. Continues with left lower extremity spasms with walking, weakness and decreased endurance. Ambulating 80 feet with walker and supervision. Will continue to progress mobility as tolerated.     Follow Up Recommendations  Home health PT;Supervision for mobility/OOB     Equipment Recommendations  None recommended by PT    Recommendations for Other Services       Precautions / Restrictions Precautions Precautions: Back Precaution Booklet Issued: Yes (comment) Required Braces or Orthoses: Spinal Brace Spinal Brace: Lumbar corset;Applied in sitting position Restrictions Weight Bearing Restrictions: No    Mobility  Bed Mobility Overal bed mobility: Needs Assistance Bed Mobility: Rolling;Sidelying to Sit;Sit to Sidelying Rolling: Min guard Sidelying to sit: Min assist     Sit to sidelying: Min assist General bed mobility comments: Rolling towards left, min assist for LLE negotiation both out of and into bed. HOB elevated, increased effort  Transfers Overall transfer level: Needs assistance Equipment used: Rolling walker (2 wheeled) Transfers: Sit to/from Stand Sit to Stand: Min guard         General transfer comment: From elevated bed surface, pt preferring to pull up on walker  Ambulation/Gait Ambulation/Gait assistance: Supervision Gait Distance (Feet): 80 Feet Assistive device: Rolling walker (2 wheeled) Gait Pattern/deviations: Antalgic;Decreased weight shift to left;Decreased stance time - left;Step-to pattern Gait velocity: decreased Gait velocity  interpretation: <1.8 ft/sec, indicate of risk for recurrent falls General Gait Details: Cues for sequencing, heavy reliance on arms, decreased LLE weightbearing. Cues provided for looking up as well   Stairs             Wheelchair Mobility    Modified Rankin (Stroke Patients Only)       Balance Overall balance assessment: Needs assistance Sitting-balance support: No upper extremity supported;Feet supported Sitting balance-Leahy Scale: Fair     Standing balance support: Bilateral upper extremity supported;During functional activity Standing balance-Leahy Scale: Poor Standing balance comment: Reliant on UE support                            Cognition Arousal/Alertness: Awake/alert Behavior During Therapy: WFL for tasks assessed/performed Overall Cognitive Status: Within Functional Limits for tasks assessed                                        Exercises      General Comments        Pertinent Vitals/Pain Pain Assessment: Faces Faces Pain Scale: Hurts even more Pain Location: L thigh spasms Pain Descriptors / Indicators: Constant;Grimacing;Guarding;Spasm Pain Intervention(s): Monitored during session;Limited activity within patient's tolerance    Home Living                      Prior Function            PT Goals (current goals can now be found in the care plan section) Acute Rehab PT Goals Patient Stated Goal: "Go home" PT Goal Formulation: With patient Time For Goal Achievement: 01/13/19 Potential to Achieve Goals: Good Progress towards  PT goals: Progressing toward goals    Frequency    Min 5X/week      PT Plan Current plan remains appropriate    Co-evaluation              AM-PAC PT "6 Clicks" Mobility   Outcome Measure  Help needed turning from your back to your side while in a flat bed without using bedrails?: None Help needed moving from lying on your back to sitting on the side of a flat bed  without using bedrails?: A Lot Help needed moving to and from a bed to a chair (including a wheelchair)?: A Little Help needed standing up from a chair using your arms (e.g., wheelchair or bedside chair)?: A Little Help needed to walk in hospital room?: A Little Help needed climbing 3-5 steps with a railing? : A Lot 6 Click Score: 17    End of Session Equipment Utilized During Treatment: Back brace Activity Tolerance: Patient tolerated treatment well Patient left: with call bell/phone within reach;in bed Nurse Communication: Mobility status PT Visit Diagnosis: Pain;Difficulty in walking, not elsewhere classified (R26.2) Pain - Right/Left: Left Pain - part of body: Leg     Time: 1346-1410 PT Time Calculation (min) (ACUTE ONLY): 24 min  Charges:  $Gait Training: 8-22 mins $Therapeutic Activity: 8-22 mins                     Ellamae Sia, PT, DPT Acute Rehabilitation Services Pager [phone] Office [phone]    Willy Eddy 01/02/2019, 3:03 PM

## 2019-01-02 NOTE — Progress Notes (Signed)
Occupational Therapy Treatment Patient Details Name: Amanda Conner MRN: [id number] DOB: 06-May-1951 Today's Date: 01/02/2019    History of present illness 68 y.o. female with s/p L5-S1 ALIF. PMH including Spinal stenosis, lumbar spondylolisthesis, lumbar scoliosis, lumbago, radiculopathy, arthritis, HTN, HLD, hiatal hernia, and GERD.   OT comments  Pt. Seen for skilled OT session.  Able to complete bed mobility, donning brace, and simulated toileting tasks mostly at min guard A.  Reports husband able to assist with LB ADLs as needed.    Follow Up Recommendations  Home health OT;Supervision/Assistance - 24 hour    Equipment Recommendations  None recommended by OT    Recommendations for Other Services PT consult    Precautions / Restrictions Precautions Precautions: Back Precaution Comments: pt. able to recall 3/3 precautions Required Braces or Orthoses: Spinal Brace Spinal Brace: Lumbar corset;Applied in sitting position       Mobility Bed Mobility Overal bed mobility: Needs Assistance Bed Mobility: Supine to Sit     Supine to sit: Min guard;HOB elevated     General bed mobility comments: reports she sleeps in a recliner so HOB remained elevated to simulate how she would exit.  states she will likely return to sleeping in recliner once home  Transfers Overall transfer level: Needs assistance Equipment used: Rolling walker (2 wheeled) Transfers: Sit to/from [facility] Sit to Stand: Min guard Stand pivot transfers: Min guard       General transfer comment: mod cues for hand placement, rw management during turns along with cues to complete the turn before sitting down.    Balance                                           ADL either performed or assessed with clinical judgement   ADL Overall ADL's : Needs assistance/impaired               Lower Body Bathing Details (indicate cue type and reason): declines need for review  or use of A/E states husband will assist as needed Upper Body Dressing : Minimal assistance;Sitting Upper Body Dressing Details (indicate cue type and reason): Min A for positioning of brace   Lower Body Dressing Details (indicate cue type and reason): declines need for review or use of A/E states husband will assist as needed Toilet Transfer: Min guard;RW;Cueing for sequencing Toilet Transfer Details (indicate cue type and reason): simulated with transfer from eob with approx. 7 steps to recliner. cues for hand placement and completing full turn before attempting to sit       Tub/Shower Transfer Details (indicate cue type and reason): pt. reports having a walk in shower with a seat, declined review of tranfer Functional mobility during ADLs: Minimal assistance;Cueing for sequencing;Rolling walker General ADL Comments: encouragement for participation. very focused on wanting her phlem tested, asking about it multiple times and the rn also.     Vision       Perception     Praxis      Cognition Arousal/Alertness: Awake/alert Behavior During Therapy: Anxious Overall Cognitive Status: Within Functional Limits for tasks assessed                                 General Comments: very focused on wanting her phlem tested, also a lot of questions about her medications, very  particular about her care and each task being performed.        Exercises     Shoulder Instructions       General Comments      Pertinent Vitals/ Pain       Pain Assessment: 0-10 Pain Score: 8  Pain Location: L thigh for spasms Pain Descriptors / Indicators: Constant;Discomfort;Grimacing;Guarding;Spasm Pain Intervention(s): Limited activity within patient's tolerance;Monitored during session;Repositioned;Patient requesting pain meds-RN notified  Home Living                                          Prior Functioning/Environment              Frequency  Min 3X/week         Progress Toward Goals  OT Goals(current goals can now be found in the care plan section)  Progress towards OT goals: Progressing toward goals     Plan Discharge plan remains appropriate    Co-evaluation                 AM-PAC OT "6 Clicks" Daily Activity     Outcome Measure   Help from another person eating meals?: None Help from another person taking care of personal grooming?: A Little Help from another person toileting, which includes using toliet, bedpan, or urinal?: A Lot Help from another person bathing (including washing, rinsing, drying)?: A Lot Help from another person to put on and taking off regular upper body clothing?: A Little Help from another person to put on and taking off regular lower body clothing?: A Lot 6 Click Score: 16    End of Session Equipment Utilized During Treatment: Gait belt;Rolling walker;Back brace  OT Visit Diagnosis: Unsteadiness on feet (R26.81);Other abnormalities of gait and mobility (R26.89);Muscle weakness (generalized) (M62.81);Pain Pain - Right/Left: Left Pain - part of body: Leg   Activity Tolerance Patient tolerated treatment well   Patient Left in chair;with call bell/phone within reach;with nursing/sitter in room   Nurse Communication (notified RN that iv was still attached to pt. but not screwed in, leaking on the bed)        Time: 7530-0511 OT Time Calculation (min): 21 min  Charges: OT General Charges $OT Visit: 1 Visit OT Treatments $Self Care/Home Management : 8-22 mins  Janice Coffin, COTA/L 01/02/2019, 10:26 AM

## 2019-01-02 NOTE — Progress Notes (Signed)
Patient ID: Amanda Conner, female   DOB: [birth year], 68 y.o.   MRN: [id number] Vital signs are stable Patient is doing reasonably well save for new onset of sputum production which she notes is associated with her bronchiectasis She usually starts Cipro or another antibiotic when this occurs We will start her on some Cipro and sputum cultures have been sent Continue to observe Mobilizing stably

## 2019-01-03 NOTE — Progress Notes (Signed)
Pt states that she feels numbness in her left upper thigh. She said she was not sure if this is an old symptom or if it is something new because she was in so much pain before. No other symptoms noted. Will cont to monitor.

## 2019-01-03 NOTE — Progress Notes (Signed)
No major change in status.  Still with fair amount of left-sided abdominal discomfort.  No new lower extremity symptoms.  Slowly mobilizing with therapy.  She is afebrile.  Vital signs are stable.  Urine output good.  Awake and alert.  Oriented and appropriate.  Motor and sensory function stable.  Wounds clean and dry.  Abdomen soft.  Progressing slowly following anterior posterior thoracic lumbar fusion.  Continue efforts at mobilization.  Home over the next few days.

## 2019-01-03 NOTE — Progress Notes (Signed)
Physical Therapy Treatment Patient Details Name: Amanda Conner MRN: [id number] DOB: [birth year] Today's Date: 01/03/2019    History of Present Illness 68 y.o. female with s/p L5-S1 ALIF. PMH including Spinal stenosis, lumbar spondylolisthesis, lumbar scoliosis, lumbago, radiculopathy, arthritis, HTN, HLD, hiatal hernia, and GERD.    PT Comments    Patient progressing well towards PT goals. Tolerated bed mobility with Min guard assist with rail removed and HOB mostly flat to simulate home. Requires cues for technique for standing. Improved ambulation distance with Min guard for safety and gait mechanics improved with increased distance. Encouraged walking to bathroom. Foley to be removed this AM. Able to recall 3/3 back precautions with increased time. Will plan for stair training tomorrow as tolerated. Will follow.   Follow Up Recommendations  Home health PT;Supervision for mobility/OOB     Equipment Recommendations  None recommended by PT    Recommendations for Other Services       Precautions / Restrictions Precautions Precautions: Back;Fall Precaution Booklet Issued: Yes (comment) Precaution Comments: pt. able to recall 3/3 precautions Required Braces or Orthoses: Spinal Brace Spinal Brace: Lumbar corset;Applied in sitting position Restrictions Weight Bearing Restrictions: No    Mobility  Bed Mobility Overal bed mobility: Needs Assistance Bed Mobility: Rolling;Sidelying to Sit Rolling: Min guard Sidelying to sit: Min guard;HOB elevated       General bed mobility comments: Increased time but able to perform log roll technique with cues to reach over to roll, no assist needed. Rail removed and HOB about 20 degrees.  Transfers Overall transfer level: Needs assistance Equipment used: Rolling walker (2 wheeled) Transfers: Sit to/from Stand Sit to Stand: Min guard         General transfer comment: Min guard for safety. Cues for hand placement/technique.  Transferred to chair post ambulation.  Ambulation/Gait Ambulation/Gait assistance: Min guard Gait Distance (Feet): 120 Feet Assistive device: Rolling walker (2 wheeled) Gait Pattern/deviations: Antalgic;Decreased weight shift to left;Decreased stance time - left;Step-to pattern;Step-through pattern Gait velocity: decreased   General Gait Details: Slow, mildly unsteady gait with cues for step through gait, increased WB through LLE and upright posture. Gait mechanics improved with increased distance.   Stairs             Wheelchair Mobility    Modified Rankin (Stroke Patients Only)       Balance Overall balance assessment: Needs assistance Sitting-balance support: No upper extremity supported;Feet supported Sitting balance-Leahy Scale: Fair Sitting balance - Comments: Able to donn brace with setup.   Standing balance support: Bilateral upper extremity supported;During functional activity Standing balance-Leahy Scale: Poor Standing balance comment: Reliant on UE support                            Cognition Arousal/Alertness: Awake/alert Behavior During Therapy: WFL for tasks assessed/performed Overall Cognitive Status: Within Functional Limits for tasks assessed                                 General Comments: Needs some encouragement, education and motivation on importance of mobility, getting foley out, getting to bathroom etc.      Exercises      General Comments        Pertinent Vitals/Pain Pain Assessment: 0-10 Pain Score: 5  Pain Location: L thigh spasms Pain Descriptors / Indicators: Constant;Grimacing;Guarding;Spasm Pain Intervention(s): Repositioned;Monitored during session    Home Living  Prior Function            PT Goals (current goals can now be found in the care plan section) Progress towards PT goals: Progressing toward goals    Frequency    Min 5X/week      PT Plan  Current plan remains appropriate    Co-evaluation              AM-PAC PT "6 Clicks" Mobility   Outcome Measure  Help needed turning from your back to your side while in a flat bed without using bedrails?: None Help needed moving from lying on your back to sitting on the side of a flat bed without using bedrails?: A Little Help needed moving to and from a bed to a chair (including a wheelchair)?: A Little Help needed standing up from a chair using your arms (e.g., wheelchair or bedside chair)?: A Little Help needed to walk in hospital room?: A Little Help needed climbing 3-5 steps with a railing? : A Little 6 Click Score: 19    End of Session Equipment Utilized During Treatment: Back brace Activity Tolerance: Patient tolerated treatment well Patient left: with call bell/phone within reach;in chair Nurse Communication: Mobility status PT Visit Diagnosis: Pain;Difficulty in walking, not elsewhere classified (R26.2) Pain - Right/Left: Left Pain - part of body: Leg     Time: 0802-0829 PT Time Calculation (min) (ACUTE ONLY): 27 min  Charges:  $Gait Training: 8-22 mins $Therapeutic Activity: 8-22 mins                     Wray Kearns, PT, DPT Acute Rehabilitation Services Pager [phone] Office [phone]       Amanda Conner 01/03/2019, 10:35 AM

## 2019-01-04 NOTE — Care Management Important Message (Signed)
Important Message  Patient Details  Name: Amanda Conner MRN: [id number] Date of Birth: 06-25-1951   Medicare Important Message Given:  Yes     Memory Argue 01/04/2019, 3:32 PM

## 2019-01-04 NOTE — Progress Notes (Signed)
Occupational Therapy Treatment Patient Details Name: Amanda Conner MRN: [id number] DOB: [birth year] Today's Date: 01/04/2019    History of present illness 68 y.o. female with s/p L5-S1 ALIF. PMH including Spinal stenosis, lumbar spondylolisthesis, lumbar scoliosis, lumbago, radiculopathy, arthritis, HTN, HLD, hiatal hernia, and GERD.   OT comments  Pt requires encouragement to participate in therapy session. Focus of session on education of AE for use during LB ADL tasks. Pt return demonstrating use of reacher with min cues during task completion. Further reviewed compensatory strategies for completing ADL including oral care with pt verbalizing understanding. Pt declining mobility OOB this session as she reports just recently getting back to bed and finally comfortable. Feel POC remains appropriate at this time. Will continue per POC.   Follow Up Recommendations  Home health OT;Supervision/Assistance - 24 hour    Equipment Recommendations  None recommended by OT          Precautions / Restrictions Precautions Precautions: Back;Fall Precaution Booklet Issued: Yes (comment) Precaution Comments: pt. able to recall 3/3 precautions Required Braces or Orthoses: Spinal Brace Spinal Brace: Lumbar corset;Applied in sitting position Restrictions Weight Bearing Restrictions: No       Mobility Bed Mobility Overal bed mobility: Needs Assistance Bed Mobility: Rolling;Sidelying to Sit;Sit to Sidelying Rolling: Min guard Sidelying to sit: Min guard;HOB elevated     Sit to sidelying: Min assist;HOB elevated General bed mobility comments: Increased time but able to perform log roll technique with assist of rail to get to EOB and assist to bring LLE into bed.  Transfers Overall transfer level: Needs assistance Equipment used: Rolling walker (2 wheeled) Transfers: Sit to/from Stand Sit to Stand: Min guard         General transfer comment: Min guard for safety. Cues for hand  placement/technique. Stood from [employer], from toilet x1.    Balance Overall balance assessment: Needs assistance Sitting-balance support: No upper extremity supported;Feet supported Sitting balance-Leahy Scale: Fair Sitting balance - Comments: declined donning brace prior to trip to bathroom.   Standing balance support: During functional activity;Bilateral upper extremity supported Standing balance-Leahy Scale: Poor Standing balance comment: Reliant on UE support; assist to donn brace in standing due to posterior lean.                           ADL either performed or assessed with clinical judgement   ADL Overall ADL's : Needs assistance/impaired                       Lower Body Dressing Details (indicate cue type and reason): pt reports she has reacher at home, educated pt in use of reacher to assist with LB dressing ADL, pt return demonstrating with min cues. declines education of sock aide as she states she typically doesn't wear socks               General ADL Comments: pt self-limiting today, requires encouragement to participate                        Cognition Arousal/Alertness: Awake/alert Behavior During Therapy: WFL for tasks assessed/performed Overall Cognitive Status: Within Functional Limits for tasks assessed                                          Exercises  Shoulder Instructions       General Comments VSS    Pertinent Vitals/ Pain       Pain Assessment: Faces Faces Pain Scale: Hurts even more Pain Location: "everywhere" Pain Descriptors / Indicators: Constant;Guarding;Sore Pain Intervention(s): Monitored during session;Limited activity within patient's tolerance  Home Living                                          Prior Functioning/Environment              Frequency  Min 3X/week        Progress Toward Goals  OT Goals(current goals can now be found in the care plan  section)  Progress towards OT goals: Progressing toward goals  Acute Rehab OT Goals Patient Stated Goal: "Go home" OT Goal Formulation: With patient Time For Goal Achievement: 01/13/19 Potential to Achieve Goals: Good ADL Goals Pt Will Perform Grooming: with modified independence;standing Pt Will Perform Upper Body Dressing: with modified independence;sitting Pt Will Perform Lower Body Dressing: with min guard assist;sit to/from stand;with adaptive equipment Pt Will Transfer to Toilet: with min guard assist;ambulating;regular height toilet Pt Will Perform Toileting - Clothing Manipulation and hygiene: with min guard assist;sitting/lateral leans;sit to/from stand Additional ADL Goal #1: Pt will verbalize 3/3 back precautions independently Additional ADL Goal #2: Pt will perform bed mobility using log roll independently in preparation for ADLs  Plan Discharge plan remains appropriate    Co-evaluation                 AM-PAC OT "6 Clicks" Daily Activity     Outcome Measure   Help from another person eating meals?: None Help from another person taking care of personal grooming?: A Little Help from another person toileting, which includes using toliet, bedpan, or urinal?: A Lot Help from another person bathing (including washing, rinsing, drying)?: A Lot Help from another person to put on and taking off regular upper body clothing?: A Little Help from another person to put on and taking off regular lower body clothing?: A Lot 6 Click Score: 16    End of Session    OT Visit Diagnosis: Unsteadiness on feet (R26.81);Other abnormalities of gait and mobility (R26.89);Muscle weakness (generalized) (M62.81);Pain Pain - Right/Left: Left Pain - part of body: Leg(back)   Activity Tolerance Patient tolerated treatment well(pt self limiting)   Patient Left in bed;with call bell/phone within reach;with bed alarm set   Nurse Communication Mobility status        Time: 7001-7494 OT  Time Calculation (min): 22 min  Charges: OT General Charges $OT Visit: 1 Visit OT Treatments $Self Care/Home Management : 8-22 mins  Lou Cal, [place] Pager [phone] Office [phone]    Raymondo Band 01/04/2019, 1:26 PM

## 2019-01-04 NOTE — Progress Notes (Signed)
Patient ID: Amanda Conner, female   DOB: 11-16-1950, 68 y.o.   MRN: [id number] Vital signs are stable Patient appears to be mobilizing better but she notes that she is sore all over in her back and her legs Her motor function is good in iliopsoas quadricep tibialis anterior and gastrocs.  The incisions are clean and dry Continue to monitor hopeful for discharge soon

## 2019-01-04 NOTE — Progress Notes (Signed)
Physical Therapy Treatment Patient Details Name: Amanda Conner MRN: [id number] DOB: 07-29-1950 Today's Date: 01/04/2019    History of Present Illness 68 y.o. female with s/p L5-S1 ALIF. PMH including Spinal stenosis, lumbar spondylolisthesis, lumbar scoliosis, lumbago, radiculopathy, arthritis, HTN, HLD, hiatal hernia, and GERD.    PT Comments    Patient not feeling well today but not able to state details of why or what is wrong. Still agreeable to mobility with some encouragement and coaxing. Tolerated transfers and gait training with Min guard assist for balance/safety. Reports left thigh numbness today which she is not sure if this is new. Reports getting up to bathroom a few times per day with nursing. Encouraged walking with nursing and sitting in chair for all meals. Able to recall 3/3 back precautions but needs cues to adhere to them during mobility. Needs to negotiate stairs prior to d/c. Will follow.    Follow Up Recommendations  Home health PT;Supervision for mobility/OOB     Equipment Recommendations  None recommended by PT    Recommendations for Other Services       Precautions / Restrictions Precautions Precautions: Back;Fall Precaution Booklet Issued: Yes (comment) Precaution Comments: pt. able to recall 3/3 precautions Required Braces or Orthoses: Spinal Brace Spinal Brace: Lumbar corset;Applied in sitting position Restrictions Weight Bearing Restrictions: No    Mobility  Bed Mobility Overal bed mobility: Needs Assistance Bed Mobility: Rolling;Sidelying to Sit;Sit to Sidelying Rolling: Min guard Sidelying to sit: Min guard;HOB elevated     Sit to sidelying: Min assist;HOB elevated General bed mobility comments: Increased time but able to perform log roll technique with assist of rail to get to EOB and assist to bring LLE into bed.  Transfers Overall transfer level: Needs assistance Equipment used: Rolling walker (2 wheeled) Transfers: Sit to/from  Stand Sit to Stand: Min guard         General transfer comment: Min guard for safety. Cues for hand placement/technique. Stood from [employer], from toilet x1.  Ambulation/Gait Ambulation/Gait assistance: Min guard Gait Distance (Feet): 100 Feet Assistive device: Rolling walker (2 wheeled) Gait Pattern/deviations: Decreased weight shift to left;Decreased stance time - left;Step-to pattern;Step-through pattern Gait velocity: decreased   General Gait Details: Slow, mostly steady gait with cues for upright posture and relax shoulders. Some left numbness/thigh pain reported.   Stairs             Wheelchair Mobility    Modified Rankin (Stroke Patients Only)       Balance Overall balance assessment: Needs assistance Sitting-balance support: No upper extremity supported;Feet supported Sitting balance-Leahy Scale: Fair Sitting balance - Comments: declined donning brace prior to trip to bathroom.   Standing balance support: During functional activity;Bilateral upper extremity supported Standing balance-Leahy Scale: Poor Standing balance comment: Reliant on UE support; assist to donn brace in standing due to posterior lean.                            Cognition Arousal/Alertness: Awake/alert Behavior During Therapy: WFL for tasks assessed/performed Overall Cognitive Status: Within Functional Limits for tasks assessed                                        Exercises      General Comments General comments (skin integrity, edema, etc.): VSS throughout.      Pertinent Vitals/Pain Pain Assessment: Faces Faces  Pain Scale: Hurts even more Pain Location: "everywhere" Pain Descriptors / Indicators: Constant;Guarding;Sore Pain Intervention(s): Repositioned;Monitored during session;Limited activity within patient's tolerance    Home Living                      Prior Function            PT Goals (current goals can now be found in the  care plan section) Progress towards PT goals: Progressing toward goals(slowly)    Frequency    Min 5X/week      PT Plan Current plan remains appropriate    Co-evaluation              AM-PAC PT "6 Clicks" Mobility   Outcome Measure  Help needed turning from your back to your side while in a flat bed without using bedrails?: A Little Help needed moving from lying on your back to sitting on the side of a flat bed without using bedrails?: A Little Help needed moving to and from a bed to a chair (including a wheelchair)?: None Help needed standing up from a chair using your arms (e.g., wheelchair or bedside chair)?: None Help needed to walk in hospital room?: A Little Help needed climbing 3-5 steps with a railing? : A Little 6 Click Score: 20    End of Session Equipment Utilized During Treatment: Back brace;Gait belt Activity Tolerance: Patient limited by pain;Other (comment)(not feeling well) Patient left: in bed;with call bell/phone within reach;with bed alarm set;with SCD's reapplied Nurse Communication: Mobility status PT Visit Diagnosis: Pain;Difficulty in walking, not elsewhere classified (R26.2) Pain - part of body: (everywhere)     Time: 0100-7121 PT Time Calculation (min) (ACUTE ONLY): 25 min  Charges:  $Gait Training: 8-22 mins $Therapeutic Activity: 8-22 mins                     Wray Kearns, PT, DPT Acute Rehabilitation Services Pager [phone] Office [place] 01/04/2019, 11:52 AM

## 2019-01-05 LAB — CULTURE, RESPIRATORY W GRAM STAIN

## 2019-01-05 MED ORDER — CIPROFLOXACIN HCL 500 MG PO TABS: 500.0000 mg | ORAL_TABLET | Freq: Two times a day (BID) | ORAL | 0 refills | Status: DC

## 2019-01-05 MED ORDER — HYDROCODONE-ACETAMINOPHEN 5-325 MG PO TABS: 1.0000 | ORAL_TABLET | ORAL | 0 refills | Status: DC | PRN

## 2019-01-05 MED ORDER — GABAPENTIN 300 MG PO CAPS: 300.0000 mg | ORAL_CAPSULE | Freq: Three times a day (TID) | ORAL | 5 refills | Status: DC

## 2019-01-05 MED ORDER — METHOCARBAMOL 500 MG PO TABS: 500.0000 mg | ORAL_TABLET | Freq: Four times a day (QID) | ORAL | 3 refills | Status: DC | PRN

## 2019-01-05 NOTE — Progress Notes (Signed)
Clothing, cell phone, glasses with patient. Simmie Davies RN

## 2019-01-05 NOTE — Discharge Summary (Signed)
Physician Discharge Summary  Patient ID: Amanda Conner MRN: [id number] DOB/AGE: 21-Dec-1950 68 y.o.  Admit date: 12/29/2018 Discharge date: 01/05/2019  Admission Diagnoses: Lumbar spondylosis and stenosis L3-4 L4-5 L5-S1 with lumbar radiculopathy  Discharge Diagnoses: Lumbar spondylosis and stenosis L3-4 L4-5 L5-S1 with lumbar radiculopathy.  Acute blood loss anemia Active Problems:   Lumbar scoliosis   Discharged Condition: good  Hospital Course: Patient was admitted to undergo surgical decompression and stabilization at L3-4 L4-5 and L5-S1.  The procedure was done using an anterior lumbar interbody technique for L4-5 and L5-S1 a anterolateral decompression with an ex lift device at L3-4 and then percutaneous pedicle screw fixation posteriorly from L3-S1.  She tolerated surgery well.  She did have some acute blood loss anemia.  With greater than 3 g drop of hemoglobin.  Consults: None  Significant Diagnostic Studies: None  Treatments: surgery: Anterior lumbar interbody arthrodesis L4-5 L5-S1 lateral decompression L3-4 posterior fixation L3 to sacrum with pedicle screws  Discharge Exam: Blood pressure 118/78, pulse (!) 103, temperature 98.2 F (36.8 C), temperature source Oral, resp. rate 20, height 5\' 4"  (1.626 m), weight 63.6 kg, SpO2 97 %. Incisions are clean and dry.  Motor function is intact.  Straight leg raising is negative to 60 degrees bilaterally  Disposition: Discharge disposition: 01-Home or Self Care       Discharge Instructions    Call MD for:  redness, tenderness, or signs of infection (pain, swelling, redness, odor or green/yellow discharge around incision site)   Complete by: As directed    Call MD for:  severe uncontrolled pain   Complete by: As directed    Call MD for:  temperature >100.4   Complete by: As directed    Diet - low sodium heart healthy   Complete by: As directed    Increase activity slowly   Complete by: As directed      Allergies as  of 01/05/2019      Reactions   Amikacin Other (See Comments)   Eosinophilia with chills and aching when given with cefoxitin   Biaxin [clarithromycin] Itching   Cefoxitin Other (See Comments)   Eosinophlia when given with amikacin   Sulfamethoxazole-trimethoprim Itching   Bacitracin-polymyxin B Rash      Medication List    TAKE these medications   Biotin 1000 MCG tablet Take 1,000 mcg by mouth daily.   Calcium 600/Vitamin D3 600-800 MG-UNIT Tabs Generic drug: Calcium Carb-Cholecalciferol Take 1 tablet by mouth daily.   ciprofloxacin 500 MG tablet Commonly known as: CIPRO Take 1 tablet (500 mg total) by mouth 2 (two) times daily.   cyclobenzaprine 10 MG tablet Commonly known as: FLEXERIL Take 10 mg by mouth 3 (three) times daily as needed for muscle spasms.   dextromethorphan-guaiFENesin 30-600 MG 12hr tablet Commonly known as: MUCINEX DM Take 1 tablet by mouth daily.   estrogen (conjugated)-medroxyprogesterone 0.45-1.5 MG tablet Commonly known as: PREMPRO Take 1 tablet by mouth daily.   fluticasone 50 MCG/ACT nasal spray Commonly known as: FLONASE Place 1 spray into both nostrils daily.   gabapentin 300 MG capsule Commonly known as: NEURONTIN Take 1 capsule (300 mg total) by mouth 3 (three) times daily.   HYDROcodone-acetaminophen 5-325 MG tablet Commonly known as: NORCO/VICODIN Take 1-2 tablets by mouth every 4 (four) hours as needed for moderate pain or severe pain ((score 7 to 10)).   latanoprost 0.005 % ophthalmic solution Commonly known as: XALATAN Place 1 drop into both eyes at bedtime.   lisinopril-hydrochlorothiazide 20-25 MG tablet  Commonly known as: ZESTORETIC Take 1 tablet by mouth daily.   loratadine 10 MG tablet Commonly known as: CLARITIN Take 10 mg by mouth daily.   methocarbamol 500 MG tablet Commonly known as: ROBAXIN Take 1 tablet (500 mg total) by mouth every 6 (six) hours as needed for muscle spasms.   pantoprazole 40 MG  tablet Commonly known as: PROTONIX Take 40 mg by mouth daily.   SUMAtriptan 100 MG tablet Commonly known as: IMITREX Take 100 mg by mouth every 2 (two) hours as needed for migraine. May repeat in 2 hours if headache persists or recurs.   Ventolin HFA 108 (90 Base) MCG/ACT inhaler Generic drug: albuterol Inhale 2 puffs into the lungs every 6 (six) hours as needed for wheezing or shortness of breath.        Signed: Blanchie Dessert Regine Christian 01/05/2019, 1:20 PM

## 2019-01-05 NOTE — Progress Notes (Signed)
Midline removed prior to discharge. Simmie Davies RN

## 2019-01-05 NOTE — TOC Transition Note (Signed)
Transition of Care [facility]) - CM/SW Discharge Note   Patient Details  Name: Amanda Conner MRN: [id number] Date of Birth: [birth year]  [facility] CM/SW Contact:  Ella Bodo, RN Phone Number: 01/05/2019, 2:46 PM   Clinical Narrative:   68 y.o. female s/p L5-S1 ALIF.  PTA, pt independent and living with spouse, who can provide care at dc.  PT/OT recommending HH follow up, and pt agreeable to services.  Referral to [facility], per pt choice.  Referral to [facility] for DME needs; 3 in 1 to be delivered to pt's room prior to dc.      Final next level of care: [place] Barriers to Discharge: No Barriers Identified   Patient Goals and CMS Choice Patient states their goals for this hospitalization and ongoing recovery are:: to feel better CMS Medicare.gov Compare Post Acute Care list provided to:: Patient Choice offered to / list presented to : Patient                      Discharge Plan and Services   Discharge Planning Services: CM Consult Post Acute Care Choice: Home Health          DME Arranged: 3-N-1 DME Agency: [employer] Date DME Agency Contacted: 01/05/19 Time DME Agency Contacted: [phone] Representative spoke with at DME Agency: [place]: PT, OT [place] Agency: [place] Date [place]: 01/05/19 Time [place]: 7 Representative spoke with at [place]: Shirlee [place]    Readmission Risk Interventions Readmission Risk Prevention Plan 01/05/2019  Post Dischage Appt Not Complete  Appt Comments Office to call pt for follow up appointment  Medication Screening Complete  Transportation Screening Complete   Reinaldo Raddle, RN, BSN  Trauma/Neuro ICU Case Manager [phone]

## 2019-01-05 NOTE — Progress Notes (Signed)
Physical Therapy Treatment Patient Details Name: Amanda Conner MRN: [id number] DOB: 04/12/1951 Today's Date: 01/05/2019    History of Present Illness 68 y.o. female s/p L5-S1 ALIF. PMH including Spinal stenosis, lumbar spondylolisthesis, lumbar scoliosis, lumbago, radiculopathy, arthritis, HTN, HLD, hiatal hernia, and GERD.    PT Comments    Patient progressing well towards PT goals. Pain and sensation seems improved. Tolerated bed mobility with HOB flat, and no rails to simulate home with increased time and effort. Improved ambulation distance with use of RW for support. 2/4 DOE noted with HR up to 124 bpm. Performed stair training with Min A for balance/safety. Education re: car transfer, washing/blow drying hair, getting up entry step etc. Encouraged pt to be OOB for lunch.  Able to recall 3/3 back precautions but still needs cues to adhere to them during functional mobility. Will continue to follow if still in hospital to improve overall strength, mobility and safety.   Follow Up Recommendations  Home health PT;Supervision for mobility/OOB     Equipment Recommendations  None recommended by PT    Recommendations for Other Services       Precautions / Restrictions Precautions Precautions: Back;Fall Precaution Booklet Issued: Yes (comment) Precaution Comments: pt. able to recall 3/3 precautions Required Braces or Orthoses: Spinal Brace Spinal Brace: Lumbar corset;Applied in sitting position Restrictions Weight Bearing Restrictions: No    Mobility  Bed Mobility Overal bed mobility: Needs Assistance Bed Mobility: Rolling;Sidelying to Sit;Sit to Sidelying Rolling: Supervision Sidelying to sit: Min guard;HOB elevated     Sit to sidelying: Min guard;HOB elevated General bed mobility comments: HOB mostly flat without use of rail to simulate home, increased time and effort. Likes to place RUE behind back when attempting to sit up on left side of bed or return to sidelying  despite cues.  Transfers Overall transfer level: Needs assistance Equipment used: Rolling walker (2 wheeled) Transfers: Sit to/from Stand Sit to Stand: Min guard         General transfer comment: Min guard for safety. Cues for hand placement/technique. Stood from [employer].  Ambulation/Gait Ambulation/Gait assistance: Supervision Gait Distance (Feet): 250 Feet Assistive device: Rolling walker (2 wheeled) Gait Pattern/deviations: Step-through pattern;Decreased stride length;Trunk flexed Gait velocity: decreased   General Gait Details: Slow, mostly steady gait with cues for upright posture and positioning in RW. 2/4 DOE. HR up to 124 bpm during session.   Stairs Stairs: Yes Stairs assistance: Min assist Stair Management: One rail Right;Step to pattern(Handheld assist) Number of Stairs: 2 General stair comments: Cues for technique, Min A for support. Pt with only small step to enter home so instructed pt to place RW on top of step and then step up.   Wheelchair Mobility    Modified Rankin (Stroke Patients Only)       Balance Overall balance assessment: Needs assistance Sitting-balance support: No upper extremity supported;Feet supported Sitting balance-Leahy Scale: Good Sitting balance - Comments: Able to donn/soff  brace with setup   Standing balance support: During functional activity;Bilateral upper extremity supported Standing balance-Leahy Scale: Poor Standing balance comment: Reliant on UE support in standing.                            Cognition Arousal/Alertness: Awake/alert Behavior During Therapy: WFL for tasks assessed/performed Overall Cognitive Status: Within Functional Limits for tasks assessed  Exercises      General Comments General comments (skin integrity, edema, etc.): Education re: car transfer, washing/blow drying hair, stairs etc.      Pertinent Vitals/Pain Pain Assessment:  0-10 Pain Score: 5  Pain Location: back Pain Descriptors / Indicators: Sore;Operative site guarding Pain Intervention(s): Monitored during session;Repositioned    Home Living                      Prior Function            PT Goals (current goals can now be found in the care plan section) Progress towards PT goals: Progressing toward goals    Frequency    Min 5X/week      PT Plan Current plan remains appropriate    Co-evaluation              AM-PAC PT "6 Clicks" Mobility   Outcome Measure  Help needed turning from your back to your side while in a flat bed without using bedrails?: None Help needed moving from lying on your back to sitting on the side of a flat bed without using bedrails?: None Help needed moving to and from a bed to a chair (including a wheelchair)?: None Help needed standing up from a chair using your arms (e.g., wheelchair or bedside chair)?: None Help needed to walk in hospital room?: A Little Help needed climbing 3-5 steps with a railing? : A Little 6 Click Score: 22    End of Session Equipment Utilized During Treatment: Back brace;Gait belt Activity Tolerance: Patient tolerated treatment well Patient left: in bed;with call bell/phone within reach;with bed alarm set;with SCD's reapplied Nurse Communication: Mobility status PT Visit Diagnosis: Pain;Difficulty in walking, not elsewhere classified (R26.2) Pain - part of body: (back)     Time: 1103-1130 PT Time Calculation (min) (ACUTE ONLY): 27 min  Charges:  $Gait Training: 8-22 mins $Therapeutic Activity: 8-22 mins                     Wray Kearns, PT, DPT Acute Rehabilitation Services Pager [phone] Office [phone]       Amanda Conner 01/05/2019, 11:43 AM

## 2019-01-18 ENCOUNTER — Telehealth: Payer: Self-pay

## 2019-01-18 NOTE — Telephone Encounter (Signed)
Pt called and said that she is having some complications from surgery. She said that she is not feeling well, cannot eat and has lost weight and she is also running a fever.   Advised her to call her doctor that did her back surgery and explained to her that he was the primary surgeon when she went to the hospital and he needs to be called. Advised her that if there is something that they find is needed on a vascular side that they will let us know.   York Cerise, CMA

## 2019-01-19 ENCOUNTER — Emergency Department (HOSPITAL_COMMUNITY)
Admission: EM | Admit: 2019-01-19 | Discharge: 2019-01-19 | Disposition: A | Discharge status: Home / Self Care | Payer: Medicare Other | Attending: Emergency Medicine | Admitting: Emergency Medicine

## 2019-01-19 ENCOUNTER — Other Ambulatory Visit: Payer: Self-pay

## 2019-01-19 DIAGNOSIS — Z20828 Contact with and (suspected) exposure to other viral communicable diseases: Secondary | ICD-10-CM | POA: Insufficient documentation

## 2019-01-19 DIAGNOSIS — E871 Hypo-osmolality and hyponatremia: Secondary | ICD-10-CM | POA: Diagnosis not present

## 2019-01-19 DIAGNOSIS — R634 Abnormal weight loss: Secondary | ICD-10-CM | POA: Diagnosis not present

## 2019-01-19 DIAGNOSIS — R509 Fever, unspecified: Secondary | ICD-10-CM | POA: Diagnosis not present

## 2019-01-19 DIAGNOSIS — Z9889 Other specified postprocedural states: Secondary | ICD-10-CM | POA: Insufficient documentation

## 2019-01-19 DIAGNOSIS — R531 Weakness: Secondary | ICD-10-CM | POA: Insufficient documentation

## 2019-01-19 DIAGNOSIS — E860 Dehydration: Secondary | ICD-10-CM | POA: Insufficient documentation

## 2019-01-19 DIAGNOSIS — R630 Anorexia: Secondary | ICD-10-CM | POA: Diagnosis not present

## 2019-01-19 LAB — PHOSPHORUS: Phosphorus: 3.4 mg/dL (ref 2.5–4.6)

## 2019-01-19 LAB — URINALYSIS, ROUTINE W REFLEX MICROSCOPIC
Bacteria, UA: NONE SEEN
Bilirubin Urine: NEGATIVE
Glucose, UA: NEGATIVE mg/dL
Ketones, ur: NEGATIVE mg/dL
Leukocytes,Ua: NEGATIVE
Nitrite: NEGATIVE
Protein, ur: NEGATIVE mg/dL
Specific Gravity, Urine: 1.004 — ABNORMAL LOW (ref 1.005–1.030)
pH: 6 (ref 5.0–8.0)

## 2019-01-19 LAB — CBC
HCT: 34.7 % — ABNORMAL LOW (ref 36.0–46.0)
Hemoglobin: 11.1 g/dL — ABNORMAL LOW (ref 12.0–15.0)
MCH: 29 pg (ref 26.0–34.0)
MCHC: 32 g/dL (ref 30.0–36.0)
MCV: 90.6 fL (ref 80.0–100.0)
Platelets: 697 10*3/uL — ABNORMAL HIGH (ref 150–400)
RBC: 3.83 MIL/uL — ABNORMAL LOW (ref 3.87–5.11)
RDW: 14.3 % (ref 11.5–15.5)
WBC: 9.9 10*3/uL (ref 4.0–10.5)
nRBC: 0 % (ref 0.0–0.2)

## 2019-01-19 LAB — COMPREHENSIVE METABOLIC PANEL
ALT: 14 U/L (ref 0–44)
AST: 19 U/L (ref 15–41)
Albumin: 3.5 g/dL (ref 3.5–5.0)
Alkaline Phosphatase: 85 U/L (ref 38–126)
Anion gap: 14 (ref 5–15)
BUN: 7 mg/dL — ABNORMAL LOW (ref 8–23)
CO2: 23 mmol/L (ref 22–32)
Calcium: 9.2 mg/dL (ref 8.9–10.3)
Chloride: 91 mmol/L — ABNORMAL LOW (ref 98–111)
Creatinine, Ser: 0.74 mg/dL (ref 0.44–1.00)
GFR calc Af Amer: >60 mL/min (ref >60)
GFR calc non Af Amer: >60 mL/min (ref >60)
Glucose, Bld: 130 mg/dL — ABNORMAL HIGH (ref 70–99)
Potassium: 4.1 mmol/L (ref 3.5–5.1)
Sodium: 128 mmol/L — ABNORMAL LOW (ref 135–145)
Total Bilirubin: 0.7 mg/dL (ref 0.3–1.2)
Total Protein: 7.7 g/dL (ref 6.5–8.1)

## 2019-01-19 LAB — TSH: TSH: 1.132 u[IU]/mL (ref 0.350–4.500)

## 2019-01-19 LAB — LIPASE, BLOOD: Lipase: 70 U/L — ABNORMAL HIGH (ref 11–51)

## 2019-01-19 LAB — TROPONIN I (HIGH SENSITIVITY)
Troponin I (High Sensitivity): 3 ng/L (ref <18)
Troponin I (High Sensitivity): 4 ng/L (ref <18)

## 2019-01-19 LAB — CK: Total CK: 29 U/L — ABNORMAL LOW (ref 38–234)

## 2019-01-19 LAB — C-REACTIVE PROTEIN: CRP: 3.9 mg/dL — ABNORMAL HIGH (ref <1.0)

## 2019-01-19 LAB — MAGNESIUM: Magnesium: 1.8 mg/dL (ref 1.7–2.4)

## 2019-01-19 LAB — SARS CORONAVIRUS 2 BY RT PCR (HOSPITAL ORDER, PERFORMED IN ~~LOC~~ HOSPITAL LAB): SARS Coronavirus 2: NEGATIVE

## 2019-01-19 MED ORDER — SODIUM CHLORIDE 0.9 % IV BOLUS
1000.0000 mL | Freq: Once | INTRAVENOUS | Status: AC
  Administered 2019-01-19: 1000 mL via INTRAVENOUS

## 2019-01-19 MED ORDER — SODIUM CHLORIDE 0.9 % IV BOLUS
500.0000 mL | Freq: Once | INTRAVENOUS | Status: AC
  Administered 2019-01-19: 500 mL via INTRAVENOUS

## 2019-01-19 MED ORDER — ONDANSETRON 4 MG PO TBDP: 4.0000 mg | ORAL_TABLET | ORAL | 0 refills | Status: DC | PRN

## 2019-01-19 MED ORDER — SODIUM CHLORIDE 0.9% FLUSH
3.0000 mL | Freq: Once | INTRAVENOUS | Status: AC
  Administered 2019-01-19: 3 mL via INTRAVENOUS

## 2019-01-19 MED ORDER — PANTOPRAZOLE SODIUM 20 MG PO TBEC: 20.0000 mg | DELAYED_RELEASE_TABLET | Freq: Every day | ORAL | 0 refills | Status: DC

## 2019-01-19 NOTE — ED Notes (Signed)
ED Provider at bedside. 

## 2019-01-19 NOTE — ED Triage Notes (Signed)
Pt presents with c/o increased fatgue for a few days with nausea and decreased appetite. Lower BP at homed than normal with increased pulse. Recent invasive spinal surgery with healing incisions, no drainage or redness to the sites. Temp of 101.4 3-4 days ago, afebrile at triage.

## 2019-01-19 NOTE — ED Notes (Signed)
Patient verbalizes understanding of discharge instructions. Opportunity for questioning and answers were provided. Armband removed by staff, pt discharged from ED.  

## 2019-01-19 NOTE — Discharge Instructions (Signed)
1.  Increase the amount of salt in your diet.  Increase fluids that contain electrolytes, such as Gatorade, juices, etc. 2.  Take Protonix daily to help with stomach inflammation or irritation.  Zofran if needed for nausea. 3.  Make an appoint with your family doctor for recheck as soon as possible. 4.  Return to the emergency department if symptoms are worsening or changing. 5.  Follow-up with your neurosurgeon as soon as possible.

## 2019-01-19 NOTE — ED Provider Notes (Signed)
[facility] EMERGENCY DEPARTMENT Provider Note   CSN: [id number] Arrival date & time: 01/19/19  1102     History   Chief Complaint Chief Complaint  Patient presents with  . Fatigue  . Nausea    HPI Amanda Conner is a 68 y.o. female.     HPI Patient is status post surgical decompression and stabilization of lumbar spine approximately 3 weeks ago.  This was done by Dr. Vertell Limber.  Patient reports that since being home she is having episodes of feeling generally weak and having loss of appetite.  He has had several episodes of waxing and waning fever.  Last fever was approximately 3 days ago.  Ports her temperature when it goes up goes up to about 101.  He reports 1 the main problem that she has had a loss of appetite and just has not been able to eat or drink much.  She reports that she has had estimated 20 pounds of weight loss since her surgery.  She denies she is having significant pain in her back or abdomen.  She is not having weakness of her lower extremities.  She reports the wounds are healing well in general appearance.  She just has persistent loss of appetite, feeling of fatigue.  He denies she is having any cough, shortness of breath or chest pain.  No URI symptoms.  No pain burning urgency with urination.  No urinary incontinence.  She does endorse some degree of constipation. Past Medical History:  Diagnosis Date  . Arthritis    osteoarthritis  . Back pain   . Bronchiectasis ([place])   . Cancer (HCC)    basal cell nose  . Chronic cough   . Complication of anesthesia   . Dyspnea   . Gastrointestinal symptoms   . GERD (gastroesophageal reflux disease)   . Glaucoma   . History of hiatal hernia   . Hyperlipemia   . Hypertension   . Osteopenia   . Pneumonia   . PONV (postoperative nausea and vomiting)    "only one time"  . Spondylisthesis     Patient Active Problem List   Diagnosis Date Noted  . Lumbar scoliosis 12/29/2018    Past Surgical  History:  Procedure Laterality Date  . ABDOMINAL EXPOSURE N/A 12/29/2018   Procedure: ABDOMINAL EXPOSURE;  Surgeon: Angelia Mould, MD;  Location: [facility];  Service: Vascular;  Laterality: N/A;  . ABDOMINOPLASTY  2002  . ABDOMINOPLASTY  1970's  . ANKLE SURGERY  10/2015  . ANTERIOR LATERAL LUMBAR FUSION WITH PERCUTANEOUS SCREW 3 LEVEL Left 12/29/2018   Procedure: Lumbar Two to Lumbar Five Anterolateral lumbar interbody fusion;  Surgeon: Erline Levine, MD;  Location: [facility];  Service: Neurosurgery;  Laterality: Left;  anterolateral  . ANTERIOR LUMBAR FUSION N/A 12/29/2018   Procedure: Lumbar Five Sacral One Anterior lumbar interbody fusion;  Surgeon: Erline Levine, MD;  Location: [facility];  Service: Neurosurgery;  Laterality: N/A;  anterior approach  . BASAL CELL CARCINOMA EXCISION  06/2018   nose  . BLEPHAROPLASTY Bilateral 1999  . BREAST ENHANCEMENT SURGERY  1970's  . COLONOSCOPY    . CYSTOURETHROSCOPY  2018   [place] J ureteal stents  . DECOMPRESSION CORE HIP Left 2014  . FACIAL COSMETIC SURGERY  2004  . FLUOROSCOPY GUIDANCE    . HIP ARTHROPLASTY Left   . JOINT REPLACEMENT Left    05/2014  . LAPAROSCOPIC PARTIAL COLECTOMY  06/26/2017  . LUMBAR PERCUTANEOUS PEDICLE SCREW 4 LEVEL N/A 12/29/2018  Procedure: Lumbar Percutaneous Pedicle Screw Placement Lumbar two-Sacral one;  Surgeon: Erline Levine, MD;  Location: [facility];  Service: Neurosurgery;  Laterality: N/A;  . MOHS SURGERY  2019   nose  . PARTIAL COLECTOMY  06/2017   for diverticulitis  . REMOVAL OF BILATERAL TISSUE EXPANDERS WITH PLACEMENT OF BILATERAL BREAST IMPLANTS  2004  . THIGH LIFT  1994  . TOE FUSION Right    great toe  . TONSILLECTOMY    . UMBILICAL HERNIA REPAIR     with tummy tuck  . UMBILICAL HERNIA REPAIR  2002     OB History   No obstetric history on file.      Home Medications    Prior to Admission medications   Medication Sig Start Date End Date Taking? Authorizing Provider  albuterol (VENTOLIN HFA)  108 (90 Base) MCG/ACT inhaler Inhale 2 puffs into the lungs every 6 (six) hours as needed for wheezing or shortness of breath.    [provider]  Biotin 1000 MCG tablet Take 1,000 mcg by mouth daily.    [provider]  Calcium Carb-Cholecalciferol (CALCIUM 600/VITAMIN D3) 600-800 MG-UNIT TABS Take 1 tablet by mouth daily.    [provider]  ciprofloxacin (CIPRO) 500 MG tablet Take 1 tablet (500 mg total) by mouth 2 (two) times daily. 01/05/19   Kristeen Miss, MD  cyclobenzaprine (FLEXERIL) 10 MG tablet Take 10 mg by mouth 3 (three) times daily as needed for muscle spasms.     [provider]  dextromethorphan-guaiFENesin (MUCINEX DM) 30-600 MG 12hr tablet Take 1 tablet by mouth daily.     [provider]  estrogen, conjugated,-medroxyprogesterone (PREMPRO) 0.45-1.5 MG tablet Take 1 tablet by mouth daily.    [provider]  fluticasone (FLONASE) 50 MCG/ACT nasal spray Place 1 spray into both nostrils daily.    [provider]  gabapentin (NEURONTIN) 300 MG capsule Take 1 capsule (300 mg total) by mouth 3 (three) times daily. 01/05/19   Kristeen Miss, MD  HYDROcodone-acetaminophen (NORCO/VICODIN) 5-325 MG tablet Take 1-2 tablets by mouth every 4 (four) hours as needed for moderate pain or severe pain ((score 7 to 10)). 01/05/19   Kristeen Miss, MD  latanoprost (XALATAN) 0.005 % ophthalmic solution Place 1 drop into both eyes at bedtime.     [provider]  lisinopril-hydrochlorothiazide (PRINZIDE,ZESTORETIC) 20-25 MG tablet Take 1 tablet by mouth daily.    [provider]  loratadine (CLARITIN) 10 MG tablet Take 10 mg by mouth daily.    [provider]  methocarbamol (ROBAXIN) 500 MG tablet Take 1 tablet (500 mg total) by mouth every 6 (six) hours as needed for muscle spasms. 01/05/19   Kristeen Miss, MD  pantoprazole (PROTONIX) 40 MG tablet Take 40 mg by mouth daily.    [provider]  SUMAtriptan  (IMITREX) 100 MG tablet Take 100 mg by mouth every 2 (two) hours as needed for migraine. May repeat in 2 hours if headache persists or recurs.    [provider]    Family History Family History  Problem Relation Age of Onset  . Hypertension Mother   . COPD Mother   . Hypertension Father   . Heart disease Father     Social History Social History   Tobacco Use  . Smoking status: Never Smoker  . Smokeless tobacco: Never Used  Substance Use Topics  . Alcohol use: Yes    Comment: occasional  . Drug use: Never     Allergies  Amikacin, Biaxin [clarithromycin], Cefoxitin, Sulfamethoxazole-trimethoprim, and Bacitracin-polymyxin b   Review of Systems Review of Systems 10 Systems reviewed and are negative for acute change except as noted in the HPI.   Physical Exam Updated Vital Signs BP 96/62   Pulse 96   Temp 97.9 F (36.6 C) (Oral)   Resp 18   SpO2 97%   Physical Exam Constitutional:      Comments: Patient is alert and nontoxic.  She is clinically well in appearance.  No respiratory distress.  Clear mental status.  HENT:     Head: Normocephalic and atraumatic.     Mouth/Throat:     Mouth: Mucous membranes are moist.     Pharynx: Oropharynx is clear.  Eyes:     Extraocular Movements: Extraocular movements intact.     Conjunctiva/sclera: Conjunctivae normal.     Pupils: Pupils are equal, round, and reactive to light.  Cardiovascular:     Rate and Rhythm: Normal rate and regular rhythm.     Pulses: Normal pulses.     Heart sounds: Normal heart sounds.  Pulmonary:     Effort: Pulmonary effort is normal.     Breath sounds: Normal breath sounds.  Abdominal:     General: There is no distension.     Palpations: Abdomen is soft.     Tenderness: There is no abdominal tenderness. There is no guarding.     Comments: Patient has abdominal incision in the left lower abdomen that is healing very well.  There is no associated erythema.  The area is soft without  guarding.  He does endorse some tenderness locally. No masses palpable.  Musculoskeletal: Normal range of motion.        General: No swelling or tenderness.     Right lower leg: No edema.     Left lower leg: No edema.     Comments: Condition of lower extremities is excellent.  Patient has good muscular formation.  She does not have any peripheral edema.  Skin condition is very good.  Calves are soft and pliable.  Dorsalis pedis pulses are 2+ and strong.  She has several incisions along the lower spine.  The condition of these is all very good.  None have any surrounding erythema and most are nearly healed.no   Focal areas of swelling or tenderness.  Skin:    General: Skin is warm.  Neurological:     General: No focal deficit present.     Mental Status: She is oriented to person, place, and time.     Cranial Nerves: No cranial nerve deficit.     Sensory: No sensory deficit.     Coordination: Coordination normal.  Psychiatric:        Mood and Affect: Mood normal.      ED Treatments / Results  Labs (all labs ordered are listed, but only abnormal results are displayed) Labs Reviewed  LIPASE, BLOOD  COMPREHENSIVE METABOLIC PANEL  CBC  URINALYSIS, ROUTINE W REFLEX MICROSCOPIC    EKG EKG Interpretation  Date/Time:  Tuesday January 19 2019 12:03:37 EDT Ventricular Rate:  88 PR Interval:    QRS Duration: 86 QT Interval:  351 QTC Calculation: 425 R Axis:   76 Text Interpretation:  Sinus rhythm Multiform ventricular premature complexes Probable left atrial enlargement Low voltage, precordial leads Nonspecific T abnormalities, anterior leads inferior lateral T wave changes new fromprevious Confirmed by Charlesetta Shanks [phone]) on 01/19/2019 12:18:14 PM   Radiology No results found.  Procedures Procedures (including critical care time)  Medications Ordered in ED Medications  sodium chloride flush (NS) 0.9 % injection 3 mL (has no administration in time range)     Initial  Impression / Assessment and Plan / ED Course  I have reviewed the triage vital signs and the nursing notes.  Pertinent labs & imaging results that were available during my care of the patient were reviewed by me and considered in my medical decision making (see chart for details).       Consult: Reviewed with Dr. Ellene Route.  He is familiar with the patient's case.  We reviewed the history of present illness and diagnostic findings.  At this time there did not appear to be evident surgical complications.  We will plan to encourage hydration and increased sodium intake.  Patient will need follow-up with PCP and they will also make arrangements for follow-up in the neurosurgical office for reassessment.  No additional imaging indicated at this time.  Patient is about 3 weeks postop from lumbar surgery.  No apparent complications.  Pain does not seem to be a significant issue and there is no neurologic dysfunction.  Patient has weight loss and anorexia with generalized weakness.  No clear etiology at this time.  Will make recommendations regarding oral intake and start the patient on daily Protonix for suspected gastritis and Zofran for nausea.  Patient is counseled for close follow-up with her PCP for reassessment of labs and response to treatment.  Final Clinical Impressions(s) / ED Diagnoses   Final diagnoses:  General weakness  Dehydration  Hyponatremia  Post-operative state    ED Discharge Orders    None       Charlesetta Shanks, MD 01/19/19 [phone]

## 2019-06-30 ENCOUNTER — Ambulatory Visit: Payer: Self-pay | Admitting: Surgery

## 2019-09-29 NOTE — Patient Instructions (Addendum)
DUE TO COVID-19 ONLY ONE VISITOR IS ALLOWED TO COME WITH YOU AND STAY IN THE WAITING ROOM ONLY DURING PRE OP AND PROCEDURE DAY OF SURGERY. THE 1 VISITOR MAY VISIT WITH YOU AFTER SURGERY IN YOUR PRIVATE ROOM DURING VISITING HOURS ONLY!  YOU NEED TO HAVE A COVID 19 TEST ON:10/05/19 @ 9:55 am , THIS TEST MUST BE DONE BEFORE SURGERY, COME  [place], [place] , [address].  ([place]) ONCE YOUR COVID TEST IS COMPLETED, PLEASE BEGIN THE QUARANTINE INSTRUCTIONS AS OUTLINED IN YOUR HANDOUT.                Amanda Conner    Your procedure is scheduled on: 10/08/19   Report to [facility] Main  Entrance   Report to admitting at: 1:15 PM     Call this number if you have problems the morning of surgery [phone]    Remember:   . BRUSH YOUR TEETH MORNING OF SURGERY AND RINSE YOUR MOUTH OUT, NO CHEWING GUM CANDY OR MINTS.     Take these medicines the morning of surgery with A SIP OF WATER: dextromethorphan,loratadine,sumatriptan.Use inhalers,flonase and eye drops as needed.                                 You may not have any metal on your body including hair pins and              piercings  Do not wear jewelry, make-up, lotions, powders or perfumes, deodorant             Do not wear nail polish on your fingernails.  Do not shave  48 hours prior to surgery.               Do not bring valuables to the hospital. [place].  Contacts, dentures or bridgework may not be worn into surgery.  Leave suitcase in the car. After surgery it may be brought to your room.     Patients discharged the day of surgery will not be allowed to drive home. IF YOU ARE HAVING SURGERY AND GOING HOME THE SAME DAY, YOU MUST HAVE AN ADULT TO DRIVE YOU HOME AND BE WITH YOU FOR 24 HOURS. YOU MAY GO HOME BY TAXI OR UBER OR ORTHERWISE, BUT AN ADULT MUST ACCOMPANY YOU HOME AND STAY WITH YOU FOR 24 HOURS.  Name and phone number of your  driver:  Special Instructions: N/A              Please read over the following fact sheets you were given: _____________________________________________________________________             NO SOLID FOOD AFTER MIDNIGHT THE NIGHT PRIOR TO SURGERY. NOTHING BY MOUTH EXCEPT CLEAR LIQUIDS UNTIL: 12:00 pm .   CLEAR LIQUID DIET   Foods Allowed                                                                     Foods Excluded  Coffee and tea, regular and decaf  liquids that you cannot  Plain Jell-O any favor except red or purple                                           see through such as: Fruit ices (not with fruit pulp)                                     milk, soups, orange juice  Iced Popsicles                                    All solid food Carbonated beverages, regular and diet                                    Cranberry, grape and apple juices Sports drinks like Gatorade Lightly seasoned clear broth or consume(fat free) Sugar, honey syrup  Sample Menu Breakfast                                Lunch                                     Supper Cranberry juice                    Beef broth                            Chicken broth Jell-O                                     Grape juice                           Apple juice Coffee or tea                        Jell-O                                      Popsicle                                                Coffee or tea                        Coffee or tea  _____________________________________________________________________  [facility] - Preparing for Surgery Before surgery, you can play an important role.  Because skin is not sterile, your skin needs to be as free of germs as possible.  You can reduce the number of germs on your skin by washing with CHG (chlorahexidine gluconate) soap before surgery.  CHG is an antiseptic cleaner which kills  germs and bonds with the skin to continue killing germs even  after washing. Please DO NOT use if you have an allergy to CHG or antibacterial soaps.  If your skin becomes reddened/irritated stop using the CHG and inform your nurse when you arrive at Short Stay. Do not shave (including legs and underarms) for at least 48 hours prior to the first CHG shower.  You may shave your face/neck. Please follow these instructions carefully:  1.  Shower with CHG Soap the night before surgery and the  morning of Surgery.  2.  If you choose to wash your hair, wash your hair first as usual with your  normal  shampoo.  3.  After you shampoo, rinse your hair and body thoroughly to remove the  shampoo.                           4.  Use CHG as you would any other liquid soap.  You can apply chg directly  to the skin and wash                       Gently with a scrungie or clean washcloth.  5.  Apply the CHG Soap to your body ONLY FROM THE NECK DOWN.   Do not use on face/ open                           Wound or open sores. Avoid contact with eyes, ears mouth and genitals (private parts).                       Wash face,  Genitals (private parts) with your normal soap.             6.  Wash thoroughly, paying special attention to the area where your surgery  will be performed.  7.  Thoroughly rinse your body with warm water from the neck down.  8.  DO NOT shower/wash with your normal soap after using and rinsing off  the CHG Soap.                9.  Pat yourself dry with a clean towel.            10.  Wear clean pajamas.            11.  Place clean sheets on your bed the night of your first shower and do not  sleep with pets. Day of Surgery : Do not apply any lotions/deodorants the morning of surgery.  Please wear clean clothes to the hospital/surgery center.  FAILURE TO FOLLOW THESE INSTRUCTIONS MAY RESULT IN THE CANCELLATION OF YOUR SURGERY PATIENT SIGNATURE_________________________________  NURSE  SIGNATURE__________________________________  ________________________________________________________________________

## 2019-09-30 ENCOUNTER — Other Ambulatory Visit: Payer: Self-pay

## 2019-09-30 ENCOUNTER — Encounter (HOSPITAL_COMMUNITY): Payer: Self-pay

## 2019-09-30 ENCOUNTER — Encounter (HOSPITAL_COMMUNITY)
Admission: RE | Admit: 2019-09-30 | Discharge: 2019-09-30 | Disposition: A | Discharge status: Home / Self Care | Payer: Medicare Other | Source: Ambulatory Visit | Attending: Surgery | Admitting: Surgery

## 2019-09-30 DIAGNOSIS — Z01812 Encounter for preprocedural laboratory examination: Secondary | ICD-10-CM | POA: Diagnosis not present

## 2019-09-30 NOTE — Progress Notes (Signed)
PCP - Dr. Algis Liming in [place]. LOV: 07/2019 Cardiologist -   Chest x-ray -  EKG -  Stress Test -  ECHO -  Cardiac Cath -   Sleep Study -  CPAP -   Fasting Blood Sugar -  Checks Blood Sugar _____ times a day  Blood Thinner Instructions: Aspirin Instructions: Last Dose:  Anesthesia review:   Patient denies shortness of breath, fever, cough and chest pain at PAT appointment   Patient verbalized understanding of instructions that were given to them at the PAT appointment. Patient was also instructed that they will need to review over the PAT instructions again at home before surgery.

## 2019-10-01 ENCOUNTER — Encounter (HOSPITAL_COMMUNITY)
Admission: RE | Admit: 2019-10-01 | Discharge: 2019-10-01 | Disposition: A | Discharge status: Home / Self Care | Payer: Medicare Other | Source: Ambulatory Visit | Attending: Surgery | Admitting: Surgery

## 2019-10-01 DIAGNOSIS — Z01812 Encounter for preprocedural laboratory examination: Secondary | ICD-10-CM | POA: Diagnosis not present

## 2019-10-01 LAB — BASIC METABOLIC PANEL
Anion gap: 8 (ref 5–15)
BUN: 15 mg/dL (ref 8–23)
CO2: 30 mmol/L (ref 22–32)
Calcium: 9.4 mg/dL (ref 8.9–10.3)
Chloride: 99 mmol/L (ref 98–111)
Creatinine, Ser: 0.64 mg/dL (ref 0.44–1.00)
GFR calc Af Amer: >60 mL/min (ref >60)
GFR calc non Af Amer: >60 mL/min (ref >60)
Glucose, Bld: 81 mg/dL (ref 70–99)
Potassium: 4.5 mmol/L (ref 3.5–5.1)
Sodium: 137 mmol/L (ref 135–145)

## 2019-10-01 LAB — CBC
HCT: 40.3 % (ref 36.0–46.0)
Hemoglobin: 12.8 g/dL (ref 12.0–15.0)
MCH: 29.7 pg (ref 26.0–34.0)
MCHC: 31.8 g/dL (ref 30.0–36.0)
MCV: 93.5 fL (ref 80.0–100.0)
Platelets: 374 10*3/uL (ref 150–400)
RBC: 4.31 MIL/uL (ref 3.87–5.11)
RDW: 15.3 % (ref 11.5–15.5)
WBC: 5.2 10*3/uL (ref 4.0–10.5)
nRBC: 0 % (ref 0.0–0.2)

## 2019-10-05 ENCOUNTER — Other Ambulatory Visit (HOSPITAL_COMMUNITY)
Admission: RE | Admit: 2019-10-05 | Discharge: 2019-10-05 | Disposition: A | Discharge status: Home / Self Care | Payer: Medicare Other | Source: Ambulatory Visit | Attending: Surgery | Admitting: Surgery

## 2019-10-05 ENCOUNTER — Encounter (HOSPITAL_COMMUNITY): Payer: Self-pay | Admitting: Surgery

## 2019-10-05 DIAGNOSIS — Z20822 Contact with and (suspected) exposure to covid-19: Secondary | ICD-10-CM | POA: Insufficient documentation

## 2019-10-05 DIAGNOSIS — K432 Incisional hernia without obstruction or gangrene: Secondary | ICD-10-CM | POA: Diagnosis present

## 2019-10-05 DIAGNOSIS — Z8249 Family history of ischemic heart disease and other diseases of the circulatory system: Secondary | ICD-10-CM | POA: Diagnosis not present

## 2019-10-05 DIAGNOSIS — Z79890 Hormone replacement therapy (postmenopausal): Secondary | ICD-10-CM | POA: Diagnosis not present

## 2019-10-05 DIAGNOSIS — M1990 Unspecified osteoarthritis, unspecified site: Secondary | ICD-10-CM | POA: Diagnosis not present

## 2019-10-05 DIAGNOSIS — K219 Gastro-esophageal reflux disease without esophagitis: Secondary | ICD-10-CM | POA: Diagnosis not present

## 2019-10-05 DIAGNOSIS — Z888 Allergy status to other drugs, medicaments and biological substances status: Secondary | ICD-10-CM | POA: Diagnosis not present

## 2019-10-05 DIAGNOSIS — Z9049 Acquired absence of other specified parts of digestive tract: Secondary | ICD-10-CM | POA: Diagnosis not present

## 2019-10-05 DIAGNOSIS — Z8261 Family history of arthritis: Secondary | ICD-10-CM | POA: Diagnosis not present

## 2019-10-05 DIAGNOSIS — I10 Essential (primary) hypertension: Secondary | ICD-10-CM | POA: Diagnosis not present

## 2019-10-05 DIAGNOSIS — Z981 Arthrodesis status: Secondary | ICD-10-CM | POA: Diagnosis not present

## 2019-10-05 DIAGNOSIS — Z01812 Encounter for preprocedural laboratory examination: Secondary | ICD-10-CM | POA: Insufficient documentation

## 2019-10-05 DIAGNOSIS — Z881 Allergy status to other antibiotic agents status: Secondary | ICD-10-CM | POA: Diagnosis not present

## 2019-10-05 DIAGNOSIS — Z79899 Other long term (current) drug therapy: Secondary | ICD-10-CM | POA: Diagnosis not present

## 2019-10-05 DIAGNOSIS — J479 Bronchiectasis, uncomplicated: Secondary | ICD-10-CM | POA: Diagnosis not present

## 2019-10-05 LAB — SARS CORONAVIRUS 2 (TAT 6-24 HRS): SARS Coronavirus 2: NEGATIVE

## 2019-10-05 NOTE — H&P (Signed)
General Surgery [facility]  Amanda Conner DOB: 1951-06-23 Married / Language: English / Race: White Female   History of Present Illness  The patient is a 69 year old female who presents with an incisional hernia.  CHIEF COMPLAINT: incisional hernia  Patient is referred by Dr. Erline Levine for surgical evaluation and management of incisional hernia. Patient had undergone a lumbar interbody fusion surgical procedure through an abdominal exposure in June 2020 by Dr. Vertell Limber and Dr. Scot Dock. Shortly after her procedure she noticed a small bulge at the lateral extent of her incision. This has slowly increased in size over the past 6 months. She denies any pain. She has had no signs or symptoms of obstruction. It is always been soft and reducible. It is more prominent in the standing position and spontaneously resolves when the patient is recumbent. Patient was examined by Dr. Vertell Limber and he contacted me to discuss the case. She presents today for evaluation for possible incisional hernia repair. Patient has had a previous umbilical hernia repair. She also underwent a lower abdominoplasty concurrently. She has also had a laparoscopic colon resection. Of note, the patient has bronchiectasis and a chronic mycoplasmal pulmonary infection for which she is being followed and managed at [facility]. She has a new drug trial coming up in the very near future which may impact the timing of her hernia repair.   Past Surgical History  Breast Augmentation  Bilateral. Colon Removal - Partial  Foot Surgery  Right. Hip Surgery  Left. Lung Surgery  Right. Oral Surgery  Spinal Surgery - Lower Back  Tonsillectomy  Ventral / Umbilical Hernia Surgery  Bilateral.  Diagnostic Studies History  Colonoscopy  1-5 years ago Mammogram  within last year Pap Smear  1-5 years ago  Allergies Amikacin *CHEMICALS*  chills and aches Biaxin *MACROLIDES*   Itching. Cefoxitin *CEPHALOSPORINS*  Sulfamethoxazole-Trimethoprim *ANTI-INFECTIVE AGENTS - MISC.*  Itching. Bacitracin-Polymyxin B *DERMATOLOGICALS*  Rash. Allergies Reconciled   Medication History  Ventolin HFA (108 (90 Base)MCG/ACT Aerosol Soln, Inhalation) Active. Prempro (0.45-1.5MG  Tablet, Oral) Active. Latanoprost (0.005% Solution, Ophthalmic) Active. Pantoprazole Sodium (40MG  Tablet DR, Oral) Active. Telmisartan (20MG  Tablet, Oral) Active. SUMAtriptan Succinate (100MG  Tablet, Oral) Active. Calcium-Vitamin D (600MG  Tablet Chewable, Oral) Active. Biotin (1MG  Capsule, Oral) Active. Claritin (10MG  Capsule, Oral) Active. Medications Reconciled  Social History  Alcohol use  Occasional alcohol use. Caffeine use  Coffee. No drug use  Tobacco use  Never smoker.  Family History Alcohol Abuse  Father, Son. Arthritis  Mother, Sister. Heart Disease  Father. Hypertension  Mother. Respiratory Condition  Mother.  Pregnancy / Birth History Age at menarche  56 years. Age of menopause  51-55 Contraceptive History  Oral contraceptives. Gravida  2 Length (months) of breastfeeding  3-6 Maternal age  6-35 Para  2  Other Problems Arthritis  Back Pain  Diverticulosis  Gastroesophageal Reflux Disease  High blood pressure  Migraine Headache  Umbilical Hernia Repair   Review of Systems General Not Present- Appetite Loss, Chills, Fatigue, Fever, Night Sweats, Weight Gain and Weight Loss. Skin Present- Dryness. Not Present- Change in Wart/Mole, Hives, Jaundice, New Lesions, Non-Healing Wounds, Rash and Ulcer. HEENT Present- Seasonal Allergies and Wears glasses/contact lenses. Not Present- Earache, Hearing Loss, Hoarseness, Nose Bleed, Oral Ulcers, Ringing in the Ears, Sinus Pain, Sore Throat, Visual Disturbances and Yellow Eyes. Respiratory Present- Chronic Cough. Not Present- Bloody sputum, Difficulty Breathing, Snoring and Wheezing. Breast Not  Present- Breast Mass, Breast Pain, Nipple Discharge and Skin Changes.  Cardiovascular Not Present- Chest Pain, Difficulty Breathing Lying Down, Leg Cramps, Palpitations, Rapid Heart Rate, Shortness of Breath and Swelling of Extremities. Gastrointestinal Not Present- Abdominal Pain, Bloating, Bloody Stool, Change in Bowel Habits, Chronic diarrhea, Constipation, Difficulty Swallowing, Excessive gas, Gets full quickly at meals, Hemorrhoids, Indigestion, Nausea, Rectal Pain and Vomiting. Female Genitourinary Not Present- Frequency, Nocturia, Painful Urination, Pelvic Pain and Urgency. Musculoskeletal Present- Back Pain. Not Present- Joint Pain, Joint Stiffness, Muscle Pain, Muscle Weakness and Swelling of Extremities. Psychiatric Not Present- Anxiety, Bipolar, Change in Sleep Pattern, Depression, Fearful and Frequent crying. Endocrine Not Present- Cold Intolerance, Excessive Hunger, Hair Changes, Heat Intolerance, Hot flashes and New Diabetes. Hematology Not Present- Blood Thinners, Easy Bruising, Excessive bleeding, Gland problems, HIV and Persistent Infections.  Vitals Weight: 130.6 lb Height: 65in Body Surface Area: 1.65 m Body Mass Index: 21.73 kg/m  Temp.: 98.53F(Thermal Scan)  Pulse: 95 (Regular)  BP: 126/76 (Sitting, Left Arm, Standard)  Physical Exam  The physical exam findings are as follows: Note:GENERAL APPEARANCE Development: normal Nutritional status: normal Gross deformities: none  SKIN Rash, lesions, ulcers: none Induration, erythema: none Nodules: none palpable  EYES Conjunctiva and lids: normal Pupils: equal and reactive Iris: normal bilaterally  EARS, NOSE, MOUTH, THROAT External ears: no lesion or deformity External nose: no lesion or deformity Hearing: grossly normal Patient is wearing a mask.  NECK Symmetric: yes Trachea: midline Thyroid: no palpable nodules in the thyroid bed  CHEST Respiratory effort: normal Retraction or accessory muscle  use: no Breath sounds: normal bilaterally Rales, rhonchi, wheeze: none  CARDIOVASCULAR Auscultation: regular rhythm, normal rate Murmurs: none Pulses: carotid and radial pulse 2+ palpable Lower extremity edema: none Lower extremity varicosities: none  ABDOMEN Distension: none Masses: none palpable Tenderness: none Hepatosplenomegaly: not present Hernia: There is a fascial defect and bulge at the lateral aspect of the left lower quadrant transverse incision. This is reducible. It is more prominent when standing. It is nontender. Well-healed laparoscopic surgical incisions and well-healed incision above the level of the umbilicus consistent with previous hernia repair. Well-healed transverse lower abdominal incision consistent with abdominoplasty.  MUSCULOSKELETAL Station and gait: normal Digits and nails: no clubbing or cyanosis Muscle strength: grossly normal all extremities Range of motion: grossly normal all extremities Deformity: none  LYMPHATIC Cervical: none palpable Supraclavicular: none palpable  PSYCHIATRIC Oriented to person, place, and time: yes Mood and affect: normal for situation Judgment and insight: appropriate for situation    Assessment & Plan   INCISIONAL HERNIA, WITHOUT OBSTRUCTION OR GANGRENE (K43.2)  Pt Education - Pamphlet Given - Hernia Surgery: discussed with patient and provided information. Patient presents on referral from her neurosurgeon, Dr. Erline Levine, for evaluation of incisional hernia. Patient is provided with written literature on hernia surgery to review at home.  Patient has a small incisional hernia involving the anterior approach for her lumbar spine fusion. The defect appears to be at the lateral portion of the surgical wound. It is reducible. I think she is a good candidate for repair by open technique with placement of prosthetic mesh for reinforcement. We discussed the use of mesh. We discussed open versus laparoscopic  repair. We discussed the restrictions on her activities following the procedure. I would anticipate an overnight stay because of her pulmonary status.  Patient is entering a drug trial at [facility] for her pulmonary disease. This may impact the timing of her surgical repair.  The risks and benefits of the procedure have been discussed at length with the patient. The  patient understands the proposed procedure, potential alternative treatments, and the course of recovery to be expected. All of the patient's questions have been answered at this time. The patient wishes to proceed with surgery.   Armandina Gemma, MD [facility] Office: [phone]

## 2019-10-08 ENCOUNTER — Ambulatory Visit (HOSPITAL_COMMUNITY): Payer: Medicare Other | Admitting: Registered Nurse

## 2019-10-08 ENCOUNTER — Encounter (HOSPITAL_COMMUNITY)
Admission: RE | Disposition: A | Discharge status: Home / Self Care | Payer: Self-pay | Source: Ambulatory Visit | Attending: Surgery

## 2019-10-08 ENCOUNTER — Ambulatory Visit (HOSPITAL_COMMUNITY)
Admission: RE | Admit: 2019-10-08 | Discharge: 2019-10-09 | Disposition: A | Discharge status: Home / Self Care | Payer: Medicare Other | Source: Ambulatory Visit | Attending: Surgery | Admitting: Surgery

## 2019-10-08 ENCOUNTER — Encounter (HOSPITAL_COMMUNITY): Payer: Self-pay | Admitting: Surgery

## 2019-10-08 DIAGNOSIS — K432 Incisional hernia without obstruction or gangrene: Secondary | ICD-10-CM | POA: Diagnosis not present

## 2019-10-08 DIAGNOSIS — I10 Essential (primary) hypertension: Secondary | ICD-10-CM | POA: Insufficient documentation

## 2019-10-08 DIAGNOSIS — K219 Gastro-esophageal reflux disease without esophagitis: Secondary | ICD-10-CM | POA: Insufficient documentation

## 2019-10-08 DIAGNOSIS — J479 Bronchiectasis, uncomplicated: Secondary | ICD-10-CM | POA: Insufficient documentation

## 2019-10-08 DIAGNOSIS — Z20822 Contact with and (suspected) exposure to covid-19: Secondary | ICD-10-CM | POA: Insufficient documentation

## 2019-10-08 DIAGNOSIS — Z881 Allergy status to other antibiotic agents status: Secondary | ICD-10-CM | POA: Insufficient documentation

## 2019-10-08 DIAGNOSIS — Z79899 Other long term (current) drug therapy: Secondary | ICD-10-CM | POA: Insufficient documentation

## 2019-10-08 DIAGNOSIS — Z8261 Family history of arthritis: Secondary | ICD-10-CM | POA: Insufficient documentation

## 2019-10-08 DIAGNOSIS — Z79890 Hormone replacement therapy (postmenopausal): Secondary | ICD-10-CM | POA: Insufficient documentation

## 2019-10-08 DIAGNOSIS — Z981 Arthrodesis status: Secondary | ICD-10-CM | POA: Insufficient documentation

## 2019-10-08 DIAGNOSIS — Z9049 Acquired absence of other specified parts of digestive tract: Secondary | ICD-10-CM | POA: Insufficient documentation

## 2019-10-08 DIAGNOSIS — Z8249 Family history of ischemic heart disease and other diseases of the circulatory system: Secondary | ICD-10-CM | POA: Insufficient documentation

## 2019-10-08 DIAGNOSIS — M1990 Unspecified osteoarthritis, unspecified site: Secondary | ICD-10-CM | POA: Insufficient documentation

## 2019-10-08 DIAGNOSIS — Z888 Allergy status to other drugs, medicaments and biological substances status: Secondary | ICD-10-CM | POA: Insufficient documentation

## 2019-10-08 HISTORY — PX: INCISIONAL HERNIA REPAIR: SHX193

## 2019-10-08 SURGERY — REPAIR, HERNIA, INCISIONAL
Anesthesia: General

## 2019-10-08 MED ORDER — ONDANSETRON HCL 4 MG/2ML IJ SOLN
4.0000 mg | Freq: Once | INTRAMUSCULAR | Status: AC | PRN
  Administered 2019-10-08: 4 mg via INTRAVENOUS

## 2019-10-08 MED ORDER — ONDANSETRON HCL 4 MG/2ML IJ SOLN
INTRAMUSCULAR | Status: AC
  Filled 2019-10-08: qty 2

## 2019-10-08 MED ORDER — ONDANSETRON 4 MG PO TBDP: 4.0000 mg | ORAL_TABLET | Freq: Four times a day (QID) | ORAL | Status: DC | PRN

## 2019-10-08 MED ORDER — PROPOFOL 10 MG/ML IV BOLUS
INTRAVENOUS | Status: DC | PRN
  Administered 2019-10-08: 150 mg via INTRAVENOUS

## 2019-10-08 MED ORDER — FENTANYL CITRATE (PF) 100 MCG/2ML IJ SOLN
25.0000 ug | INTRAMUSCULAR | Status: DC | PRN
  Administered 2019-10-08 (×2): 25 ug via INTRAVENOUS

## 2019-10-08 MED ORDER — MIDAZOLAM HCL 2 MG/2ML IJ SOLN
INTRAMUSCULAR | Status: AC
  Filled 2019-10-08: qty 2

## 2019-10-08 MED ORDER — TRAMADOL HCL 50 MG PO TABS: 50.0000 mg | ORAL_TABLET | Freq: Four times a day (QID) | ORAL | Status: DC | PRN

## 2019-10-08 MED ORDER — CHLORHEXIDINE GLUCONATE CLOTH 2 % EX PADS: 6.0000 | MEDICATED_PAD | Freq: Once | CUTANEOUS | Status: DC

## 2019-10-08 MED ORDER — FENTANYL CITRATE (PF) 100 MCG/2ML IJ SOLN
INTRAMUSCULAR | Status: AC
  Filled 2019-10-08: qty 2

## 2019-10-08 MED ORDER — DM-GUAIFENESIN ER 30-600 MG PO TB12
1.0000 | ORAL_TABLET | Freq: Every day | ORAL | Status: DC
  Filled 2019-10-08: qty 1

## 2019-10-08 MED ORDER — KETOROLAC TROMETHAMINE 15 MG/ML IJ SOLN
INTRAMUSCULAR | Status: AC
  Filled 2019-10-08: qty 1

## 2019-10-08 MED ORDER — HYDROCHLOROTHIAZIDE 25 MG PO TABS
25.0000 mg | ORAL_TABLET | Freq: Every day | ORAL | Status: DC
  Filled 2019-10-08 (×2): qty 1

## 2019-10-08 MED ORDER — ROCURONIUM BROMIDE 10 MG/ML (PF) SYRINGE
PREFILLED_SYRINGE | INTRAVENOUS | Status: AC
  Filled 2019-10-08: qty 10

## 2019-10-08 MED ORDER — FLUTICASONE PROPIONATE 50 MCG/ACT NA SUSP
1.0000 | Freq: Every day | NASAL | Status: DC
  Filled 2019-10-08: qty 16

## 2019-10-08 MED ORDER — SUGAMMADEX SODIUM 200 MG/2ML IV SOLN
INTRAVENOUS | Status: DC | PRN
  Administered 2019-10-08: 130 mg via INTRAVENOUS

## 2019-10-08 MED ORDER — HYDROCODONE-ACETAMINOPHEN 5-325 MG PO TABS: 1.0000 | ORAL_TABLET | ORAL | Status: DC | PRN

## 2019-10-08 MED ORDER — HYDROMORPHONE HCL 1 MG/ML IJ SOLN
1.0000 mg | INTRAMUSCULAR | Status: DC | PRN
  Administered 2019-10-08 (×2): 1 mg via INTRAVENOUS
  Filled 2019-10-08 (×2): qty 1

## 2019-10-08 MED ORDER — DEXAMETHASONE SODIUM PHOSPHATE 10 MG/ML IJ SOLN
INTRAMUSCULAR | Status: DC | PRN
  Administered 2019-10-08: 10 mg via INTRAVENOUS

## 2019-10-08 MED ORDER — ONDANSETRON HCL 4 MG/2ML IJ SOLN
4.0000 mg | Freq: Four times a day (QID) | INTRAMUSCULAR | Status: DC | PRN
  Administered 2019-10-08: 4 mg via INTRAVENOUS
  Filled 2019-10-08: qty 2

## 2019-10-08 MED ORDER — 0.9 % SODIUM CHLORIDE (POUR BTL) OPTIME
TOPICAL | Status: DC | PRN
  Administered 2019-10-08: 1000 mL

## 2019-10-08 MED ORDER — KETOROLAC TROMETHAMINE 15 MG/ML IJ SOLN
15.0000 mg | Freq: Once | INTRAMUSCULAR | Status: AC
  Administered 2019-10-08: 15 mg via INTRAVENOUS

## 2019-10-08 MED ORDER — MEPERIDINE HCL 50 MG/ML IJ SOLN: 6.2500 mg | INTRAMUSCULAR | Status: DC | PRN

## 2019-10-08 MED ORDER — LIDOCAINE 2% (20 MG/ML) 5 ML SYRINGE
INTRAMUSCULAR | Status: AC
  Filled 2019-10-08: qty 5

## 2019-10-08 MED ORDER — ACETAMINOPHEN 650 MG RE SUPP: 650.0000 mg | Freq: Four times a day (QID) | RECTAL | Status: DC | PRN

## 2019-10-08 MED ORDER — ALBUTEROL SULFATE (2.5 MG/3ML) 0.083% IN NEBU: 2.5000 mg | INHALATION_SOLUTION | Freq: Four times a day (QID) | RESPIRATORY_TRACT | Status: DC | PRN

## 2019-10-08 MED ORDER — LACTATED RINGERS IV SOLN: INTRAVENOUS | Status: DC

## 2019-10-08 MED ORDER — ACETAMINOPHEN 325 MG PO TABS
650.0000 mg | ORAL_TABLET | Freq: Four times a day (QID) | ORAL | Status: DC | PRN
  Administered 2019-10-09: 650 mg via ORAL
  Filled 2019-10-08: qty 2

## 2019-10-08 MED ORDER — IRBESARTAN 75 MG PO TABS
75.0000 mg | ORAL_TABLET | Freq: Every day | ORAL | Status: DC
  Filled 2019-10-08: qty 1

## 2019-10-08 MED ORDER — FENTANYL CITRATE (PF) 250 MCG/5ML IJ SOLN
INTRAMUSCULAR | Status: DC | PRN
  Administered 2019-10-08 (×2): 50 ug via INTRAVENOUS
  Administered 2019-10-08: 75 ug via INTRAVENOUS
  Administered 2019-10-08: 50 ug via INTRAVENOUS
  Administered 2019-10-08: 25 ug via INTRAVENOUS

## 2019-10-08 MED ORDER — KCL IN DEXTROSE-NACL 20-5-0.45 MEQ/L-%-% IV SOLN
INTRAVENOUS | Status: DC
  Filled 2019-10-08: qty 1000

## 2019-10-08 MED ORDER — LIDOCAINE 2% (20 MG/ML) 5 ML SYRINGE
INTRAMUSCULAR | Status: DC | PRN
  Administered 2019-10-08: 100 mg via INTRAVENOUS

## 2019-10-08 MED ORDER — CIPROFLOXACIN IN D5W 400 MG/200ML IV SOLN
400.0000 mg | INTRAVENOUS | Status: AC
  Administered 2019-10-08: 400 mg via INTRAVENOUS
  Filled 2019-10-08: qty 200

## 2019-10-08 MED ORDER — FENTANYL CITRATE (PF) 250 MCG/5ML IJ SOLN
INTRAMUSCULAR | Status: AC
  Filled 2019-10-08: qty 5

## 2019-10-08 MED ORDER — LATANOPROST 0.005 % OP SOLN
1.0000 [drp] | Freq: Every day | OPHTHALMIC | Status: DC
  Administered 2019-10-08: 1 [drp] via OPHTHALMIC
  Filled 2019-10-08: qty 2.5

## 2019-10-08 MED ORDER — DEXAMETHASONE SODIUM PHOSPHATE 10 MG/ML IJ SOLN
INTRAMUSCULAR | Status: AC
  Filled 2019-10-08: qty 1

## 2019-10-08 MED ORDER — MIDAZOLAM HCL 5 MG/5ML IJ SOLN
INTRAMUSCULAR | Status: DC | PRN
  Administered 2019-10-08: 2 mg via INTRAVENOUS

## 2019-10-08 MED ORDER — BUPIVACAINE HCL 0.25 % IJ SOLN
INTRAMUSCULAR | Status: AC
  Filled 2019-10-08: qty 1

## 2019-10-08 MED ORDER — ROCURONIUM BROMIDE 10 MG/ML (PF) SYRINGE
PREFILLED_SYRINGE | INTRAVENOUS | Status: DC | PRN
  Administered 2019-10-08: 60 mg via INTRAVENOUS

## 2019-10-08 MED ORDER — ACETAMINOPHEN 10 MG/ML IV SOLN: 1000.0000 mg | Freq: Once | INTRAVENOUS | Status: DC | PRN

## 2019-10-08 MED ORDER — BUPIVACAINE HCL (PF) 0.25 % IJ SOLN
INTRAMUSCULAR | Status: DC | PRN
  Administered 2019-10-08: 17 mL

## 2019-10-08 MED ORDER — ONDANSETRON HCL 4 MG/2ML IJ SOLN
INTRAMUSCULAR | Status: DC | PRN
  Administered 2019-10-08: 4 mg via INTRAVENOUS

## 2019-10-08 SURGICAL SUPPLY — 22 items
BINDER ABDOMINAL 12 ML 46-62 (SOFTGOODS) IMPLANT
CHLORAPREP W/TINT 26 (MISCELLANEOUS) ×3 IMPLANT
COVER SURGICAL LIGHT HANDLE (MISCELLANEOUS) ×3 IMPLANT
COVER WAND RF STERILE (DRAPES) IMPLANT
DERMABOND ADVANCED (GAUZE/BANDAGES/DRESSINGS) ×2
DERMABOND ADVANCED .7 DNX12 (GAUZE/BANDAGES/DRESSINGS) ×1 IMPLANT
DRAPE LAPAROSCOPIC ABDOMINAL (DRAPES) ×3 IMPLANT
ELECT REM PT RETURN 15FT ADLT (MISCELLANEOUS) ×3 IMPLANT
GAUZE SPONGE 4X4 12PLY STRL (GAUZE/BANDAGES/DRESSINGS) ×3 IMPLANT
GLOVE SURG ORTHO 8.0 STRL STRW (GLOVE) ×3 IMPLANT
GOWN STRL REUS W/TWL XL LVL3 (GOWN DISPOSABLE) ×6 IMPLANT
KIT BASIN OR (CUSTOM PROCEDURE TRAY) ×3 IMPLANT
KIT TURNOVER KIT A (KITS) IMPLANT
MESH ULTRAPRO 3X6 7.6X15CM (Mesh General) ×3 IMPLANT
NS IRRIG 1000ML POUR BTL (IV SOLUTION) ×3 IMPLANT
PACK GENERAL/GYN (CUSTOM PROCEDURE TRAY) ×3 IMPLANT
PENCIL SMOKE EVACUATOR (MISCELLANEOUS) IMPLANT
SUT NOVA 1 T20/GS 25DT (SUTURE) ×6 IMPLANT
SUT NOVA NAB GS-21 0 18 T12 DT (SUTURE) ×21 IMPLANT
SUT VIC AB 2-0 CT2 27 (SUTURE) ×3 IMPLANT
TOWEL OR 17X26 10 PK STRL BLUE (TOWEL DISPOSABLE) ×3 IMPLANT
TRAY FOLEY MTR SLVR 16FR STAT (SET/KITS/TRAYS/PACK) IMPLANT

## 2019-10-08 NOTE — Anesthesia Procedure Notes (Addendum)
Procedure Name: Intubation Date/Time: 10/08/2019 3:29 PM Performed by: Sharlette Dense, CRNA Patient Re-evaluated:Patient Re-evaluated prior to induction Oxygen Delivery Method: Circle system utilized Preoxygenation: Pre-oxygenation with 100% oxygen Induction Type: IV induction Ventilation: Mask ventilation without difficulty Laryngoscope Size: Miller and 2 Grade View: Grade III Tube type: Oral Tube size: 7.0 mm Number of attempts: 2 Airway Equipment and Method: Stylet Placement Confirmation: ETT inserted through vocal cords under direct vision,  positive ETCO2 and breath sounds checked- equal and bilateral Secured at: 20 cm Tube secured with: Tape Dental Injury: Teeth and Oropharynx as per pre-operative assessment  Difficulty Due To: Difficult Airway- due to anterior larynx

## 2019-10-08 NOTE — Discharge Instructions (Signed)
[facility]  HERNIA REPAIR POST OP INSTRUCTIONS  Always review your discharge instruction sheet given to you by the facility where your surgery was performed.  1. A  prescription for pain medication may be given to you upon discharge.  Take your pain medication as prescribed.  If narcotic pain medicine is not needed, then you may take acetaminophen (Tylenol) or ibuprofen (Advil) as needed.  2. Take your usually prescribed medications unless otherwise directed.  3. If you need a refill on your pain medication, please contact your pharmacy.  They will contact our office to request authorization. Prescriptions will not be filled after 5 pm daily or on weekends.  4. You should follow a light diet the first 24 hours after arrival home, such as soup and crackers or toast.  Be sure to include plenty of fluids daily.  Resume your normal diet the day after surgery.  5. Most patients will experience some swelling and bruising around the surgical site.  Ice packs and reclining will help.  Swelling and bruising can take several days to resolve.   6. It is common to experience some constipation if taking pain medication after surgery.  Increasing fluid intake and taking a stool softener (such as Colace) will usually help or prevent this problem from occurring.  A mild laxative (Milk of Magnesia or Miralax) should be taken according to package directions if there are no bowel movements after 48 hours.  7. You will likely have Dermabond (topical glue) over your incisions.  This seals the incisions and allows you to bathe and shower at any time after your surgery.  Glue should remain in place for up to 10 days.  It may be removed after 10 days by pealing off the Dermabond material or using Vaseline or naval jelly to remove.  8. If you have steri-strips over your incisions, you may remove the gauze bandage on the second day after surgery, and you may shower at that time.  Leave your steri-strips  (small skin tapes) in place directly over the incision.  These strips should remain on the skin for 5-7 days and then be removed.  You may get them wet in the shower and pat them dry.  9. ACTIVITIES:  You may resume regular (light) daily activities beginning the next day - such as daily self-care, walking, climbing stairs - gradually increasing activities as tolerated.  You may have sexual intercourse when it is comfortable.  Refrain from any heavy lifting or straining until approved by your doctor.  You may drive when you are no longer taking prescription pain medication, when you can comfortably wear a seatbelt, and when you can safely maneuver your car and apply brakes.  10. You should see your doctor in the office for a follow-up appointment approximately 2-3 weeks after your surgery.  Make sure that you call for this appointment within a day or two after you arrive home to insure a convenient appointment time.  WHEN TO CALL YOUR DOCTOR: 1. Fever greater than 101.0 2. Inability to urinate 3. Persistent nausea and/or vomiting 4. Extreme swelling or bruising 5. Continued bleeding from incision 6. Increased pain, redness, or drainage from the incision  The clinic staff is available to answer your questions during regular business hours.  Please don't hesitate to call and ask to speak to one of the nurses for clinical concerns.  If you have a medical emergency, go to the nearest emergency room or call 911.  A surgeon from [employer] [facility] is always on call for the hospital.   [facility] [address], [place], [place], [place]  [address]  [phone] ? [phone] ? FAX [phone]  [email or web address]

## 2019-10-08 NOTE — Anesthesia Preprocedure Evaluation (Signed)
Anesthesia Evaluation  Patient identified by MRN, date of birth, ID band Patient awake    Reviewed: Allergy & Precautions, NPO status , Patient's Chart, lab work & pertinent test results  History of Anesthesia Complications (+) PONV and history of anesthetic complications  Airway Mallampati: I       Dental no notable dental hx. (+) Teeth Intact   Pulmonary    Pulmonary exam normal breath sounds clear to auscultation       Cardiovascular hypertension, Pt. on medications Normal cardiovascular exam Rhythm:Regular Rate:Normal     Neuro/Psych negative neurological ROS  negative psych ROS   GI/Hepatic Neg liver ROS, GERD  Medicated and Controlled,  Endo/Other  negative endocrine ROS  Renal/GU negative Renal ROS  negative genitourinary   Musculoskeletal  (+) Arthritis , Osteoarthritis,    Abdominal Normal abdominal exam  (+)   Peds  Hematology negative hematology ROS (+)   Anesthesia Other Findings   Reproductive/Obstetrics                             Anesthesia Physical Anesthesia Plan  ASA: II  Anesthesia Plan: General   Post-op Pain Management:    Induction: Intravenous  PONV Risk Score and Plan: 4 or greater and Ondansetron, Dexamethasone and Midazolam  Airway Management Planned: Oral ETT  Additional Equipment: None  Intra-op Plan:   Post-operative Plan: Extubation in OR  Informed Consent: I have reviewed the patients History and Physical, chart, labs and discussed the procedure including the risks, benefits and alternatives for the proposed anesthesia with the patient or authorized representative who has indicated his/her understanding and acceptance.     Dental advisory given  Plan Discussed with: CRNA  Anesthesia Plan Comments:         Anesthesia Quick Evaluation

## 2019-10-08 NOTE — Transfer of Care (Signed)
Immediate Anesthesia Transfer of Care Note  Patient: Amanda Conner  Procedure(s) Performed: OPEN INCISIONAL HERNIA REPAIR WITH MESH (N/A )  Patient Location: PACU  Anesthesia Type:General  Level of Consciousness: awake and oriented  Airway & Oxygen Therapy: Patient Spontanous Breathing and Patient connected to face mask oxygen  Post-op Assessment: Report given to RN and Post -op Vital signs reviewed and stable  Post vital signs: Reviewed and stable  Last Vitals:  Vitals Value Taken Time  BP 149/88 10/08/19 1643  Temp 37 C 10/08/19 1643  Pulse 76 10/08/19 1644  Resp 15 10/08/19 1644  SpO2 100 % 10/08/19 1644  Vitals shown include unvalidated device data.  Last Pain:  Vitals:   10/08/19 1345  TempSrc:   PainSc: 0-No pain         Complications: No apparent anesthesia complications

## 2019-10-08 NOTE — Op Note (Signed)
Operative Note  Pre-operative Diagnosis:  Incisional hernia, reducible  Post-operative Diagnosis:  same  Surgeon:  Armandina Gemma, MD  Assistant:  none   Procedure:  Open repair incisional hernia with mesh  Anesthesia:  general  Estimated Blood Loss:  minimal  Drains: none         Specimen: none  Indications:  Patient is referred by Dr. Erline Levine for surgical evaluation and management of incisional hernia. Patient had undergone a lumbar interbody fusion surgical procedure through an abdominal exposure in June 2020 by Dr. Vertell Limber and Dr. Scot Dock. Shortly after her procedure she noticed a small bulge at the lateral extent of her incision. This has slowly increased in size over the past 6 months. She denies any pain. She has had no signs or symptoms of obstruction. It is always been soft and reducible. It is more prominent in the standing position and spontaneously resolves when the patient is recumbent. Patient was examined by Dr. Vertell Limber and he contacted me to discuss the case. She presents today for evaluation for possible incisional hernia repair. Patient has had a previous umbilical hernia repair. She also underwent a lower abdominoplasty concurrently. She has also had a laparoscopic colon resection. Of note, the patient has bronchiectasis and a chronic mycoplasmal pulmonary infection for which she is being followed and managed at [facility]. She has a new drug trial coming up in the very near future which may impact the timing of her hernia repair.  Procedure:  The patient was seen in the pre-op holding area. The risks, benefits, complications, treatment options, and expected outcomes were previously discussed with the patient. The patient agreed with the proposed plan and has signed the informed consent form.  The patient was brought to the operating room by the surgical team, identified as Amanda Conner and the procedure verified. A "time out" was  completed and the above information confirmed.  Following administration of general anesthesia, the patient is positioned and then prepped and draped in the usual aseptic fashion.  After ascertaining that an adequate level of anesthesia been achieved, the previous transverse left lower quadrant abdominal incision is reopened at its lateral extent and extended laterally for approximately 5 cm.  Dissection is carried through subcutaneous tissues.  External oblique fascia is opened.  Exploration reveals a hernia defect probably at the lateral aspect of the previous abdominal incision.  Dissection was carried through the defect and into the peritoneal cavity.  Margins of the defect are defined and the hernia sac is excised and discarded.  The transversalis and internal oblique are then closed with interrupted #1 Novafil simple sutures.  A 3 inch x 6 inch sheet of Ethicon ultra Pro mesh is selected and trimmed to the appropriate dimensions.  This is inserted into the space between the external oblique and the closure of the internal oblique and transversus.  The mesh overlays the repair by several centimeters in all directions.  It is secured to the underlying internal oblique with interrupted 0 Novafil sutures.  Next the external oblique is closed with interrupted 0 Novafil sutures.  Local field block is placed with Marcaine.  Subcutaneous tissues were then closed with interrupted 3-0 Vicryl sutures.  Skin is closed with a running 4-0 Monocryl subcuticular suture.  Wound is washed and dried and Dermabond is applied as dressing.  Patient is awakened from anesthesia and transported to the recovery room.  The patient tolerated the procedure well.   Armandina Gemma, MD [facility]  Office: [phone]

## 2019-10-08 NOTE — Interval H&P Note (Signed)
History and Physical Interval Note:  10/08/2019 3:06 PM  Amanda Conner  has presented today for surgery, with the diagnosis of incisional hernia.  The various methods of treatment have been discussed with the patient and family. After consideration of risks, benefits and other options for treatment, the patient has consented to    Procedure(s): OPEN INCISIONAL HERNIA REPAIR WITH MESH (N/A) as a surgical intervention.    The patient's history has been reviewed, patient examined, no change in status, stable for surgery.  I have reviewed the patient's chart and labs.  Questions were answered to the patient's satisfaction.    Armandina Gemma, MD [facility] Office: [place]

## 2019-10-08 NOTE — Anesthesia Postprocedure Evaluation (Addendum)
Anesthesia Post Note  Patient: Amanda Conner  Procedure(s) Performed: OPEN INCISIONAL HERNIA REPAIR WITH MESH (N/A )     Patient location during evaluation: PACU Anesthesia Type: General Level of consciousness: awake and sedated Pain management: pain level controlled Vital Signs Assessment: post-procedure vital signs reviewed and stable Respiratory status: spontaneous breathing Cardiovascular status: stable Postop Assessment: no headache Anesthetic complications: yes Anesthetic complication details: PONV   Last Vitals:  Vitals:   10/08/19 1715 10/08/19 1730  BP: (!) 155/85 (!) 154/87  Pulse: 84 80  Resp: 16 10  Temp:    SpO2: 100% 100%    Last Pain:  Vitals:   10/08/19 1730  TempSrc:   PainSc: 4    Pain Goal:                   Huston Foley

## 2019-10-09 ENCOUNTER — Other Ambulatory Visit: Payer: Self-pay

## 2019-10-09 DIAGNOSIS — K432 Incisional hernia without obstruction or gangrene: Secondary | ICD-10-CM | POA: Diagnosis not present

## 2019-10-09 MED ORDER — TRAMADOL HCL 50 MG PO TABS: 50.0000 mg | ORAL_TABLET | Freq: Four times a day (QID) | ORAL | 0 refills | Status: DC | PRN

## 2019-10-09 NOTE — Progress Notes (Signed)
Patient ID: Amanda Conner, female   DOB: [birth year], 69 y.o.   MRN: [id number]   Doing well No complaints Abdomen soft, incision clean  Plan: Discharge home

## 2019-10-09 NOTE — Progress Notes (Signed)
Nurse reviewed discharge instructions with pt.  Pt verbalized understanding of discharge instructions, follow up appointments and new medications.  No concerns at time of discharge. 

## 2019-10-09 NOTE — Discharge Summary (Signed)
Physician Discharge Summary  Patient ID: Amanda Conner MRN: [id number] DOB/AGE: 1951-04-11 69 y.o.  Admit date: 10/08/2019 Discharge date: 10/09/2019  Admission Diagnoses:  Discharge Diagnoses:  Principal Problem:   Incisional hernia, without obstruction or gangrene Active Problems:   Ventral incisional hernia   Discharged Condition: good  Hospital Course: uneventful post op recovery.  Discharged home POD#1  Consults: None  Significant Diagnostic Studies:   Treatments: surgery: incisional hernia repair with mesh  Discharge Exam: Blood pressure 113/69, pulse 67, temperature 98.3 F (36.8 C), temperature source Oral, resp. rate 18, height 5\' 5"  (1.651 m), weight 60.3 kg, SpO2 94 %. General appearance: alert, cooperative and no distress Resp: clear to auscultation bilaterally Cardio: regular rate and rhythm, S1, S2 normal, no murmur, click, rub or gallop Incision/Wound:abdomen soft, LLQ incision clean  Disposition: Discharge disposition: 01-Home or Self Care        Allergies as of 10/09/2019      Reactions   Amikacin Other (See Comments)   Eosinophilia with chills and aching when given with cefoxitin   Biaxin [clarithromycin] Itching   Cefoxitin Other (See Comments)   Eosinophlia when given with amikacin   Sulfamethoxazole-trimethoprim Itching   Vancomycin    Bacitracin-polymyxin B Rash      Medication List    TAKE these medications   Biotin 1000 MCG tablet Take 1,000 mcg by mouth daily.   Calcium 600/Vitamin D3 600-800 MG-UNIT Tabs Generic drug: Calcium Carb-Cholecalciferol Take 1 tablet by mouth daily.   ciprofloxacin 500 MG tablet Commonly known as: CIPRO Take 1 tablet (500 mg total) by mouth 2 (two) times daily.   dextromethorphan-guaiFENesin 30-600 MG 12hr tablet Commonly known as: MUCINEX DM Take 1 tablet by mouth daily.   estrogen (conjugated)-medroxyprogesterone 0.45-1.5 MG tablet Commonly known as: PREMPRO Take 1 tablet by mouth  daily.   fluticasone 50 MCG/ACT nasal spray Commonly known as: FLONASE Place 1 spray into both nostrils daily.   gabapentin 300 MG capsule Commonly known as: NEURONTIN Take 1 capsule (300 mg total) by mouth 3 (three) times daily.   hydrochlorothiazide 25 MG tablet Commonly known as: HYDRODIURIL Take 25 mg by mouth daily.   HYDROcodone-acetaminophen 5-325 MG tablet Commonly known as: NORCO/VICODIN Take 1-2 tablets by mouth every 4 (four) hours as needed for moderate pain or severe pain ((score 7 to 10)).   latanoprost 0.005 % ophthalmic solution Commonly known as: XALATAN Place 1 drop into both eyes at bedtime.   loratadine 10 MG tablet Commonly known as: CLARITIN Take 10 mg by mouth daily.   methocarbamol 500 MG tablet Commonly known as: ROBAXIN Take 1 tablet (500 mg total) by mouth every 6 (six) hours as needed for muscle spasms.   ondansetron 4 MG disintegrating tablet Commonly known as: Zofran ODT Take 1 tablet (4 mg total) by mouth every 4 (four) hours as needed for nausea or vomiting.   pantoprazole 40 MG tablet Commonly known as: PROTONIX Take 40 mg by mouth daily.   pantoprazole 20 MG tablet Commonly known as: PROTONIX Take 1 tablet (20 mg total) by mouth daily.   SUMAtriptan 100 MG tablet Commonly known as: IMITREX Take 100 mg by mouth every 2 (two) hours as needed for migraine. May repeat in 2 hours if headache persists or recurs.   telmisartan 20 MG tablet Commonly known as: MICARDIS Take 20 mg by mouth daily.   traMADol 50 MG tablet Commonly known as: Ultram Take 1 tablet (50 mg total) by mouth every 6 (six) hours as needed  for moderate pain or severe pain.   Ventolin HFA 108 (90 Base) MCG/ACT inhaler Generic drug: albuterol Inhale 2 puffs into the lungs every 6 (six) hours as needed for wheezing or shortness of breath.      Follow-up Information    Armandina Gemma, MD. Schedule an appointment as soon as possible for a visit in 3 weeks.    Specialty: General Surgery Why: For wound re-check Contact information: [place] [address] [phone]           Signed: Coralie Keens 10/09/2019, 8:10 AM

## 2019-10-11 ENCOUNTER — Encounter: Payer: Self-pay | Admitting: *Deleted

## 2020-09-18 ENCOUNTER — Other Ambulatory Visit: Payer: Self-pay | Admitting: Internal Medicine

## 2020-09-18 DIAGNOSIS — M545 Low back pain, unspecified: Secondary | ICD-10-CM

## 2020-09-29 ENCOUNTER — Ambulatory Visit
Admission: RE | Admit: 2020-09-29 | Discharge: 2020-09-29 | Disposition: A | Discharge status: Home / Self Care | Payer: Medicare Other | Source: Ambulatory Visit | Attending: Internal Medicine | Admitting: Internal Medicine

## 2020-09-29 DIAGNOSIS — M545 Low back pain, unspecified: Secondary | ICD-10-CM

## 2020-09-29 IMAGING — MR MR LUMBAR SPINE WO/W CM
8 series · 48 of 48 positions shown · IV contrast (multihance)
Comparison: CT [DATE].  MRI [DATE]

CLINICAL DATA: Low back pain radiating to the right leg with right
leg weakness over the last 3 months.

EXAM:
MRI LUMBAR SPINE WITHOUT AND WITH CONTRAST
TECHNIQUE: Multiplanar and multiecho pulse sequences of the lumbar spine were
obtained without and with intravenous contrast.
CONTRAST:  12mL MULTIHANCE GADOBENATE DIMEGLUMINE 529 MG/ML IV SOLN

[Series 5: T2 · sagittal · 4.0mm · 0.70mm/px · 4 of 17 slices shown (1 of 2)]
[im 1/17]
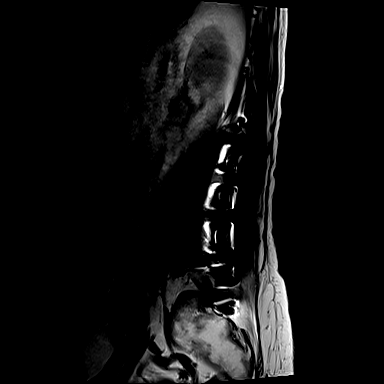
[im 6/17]
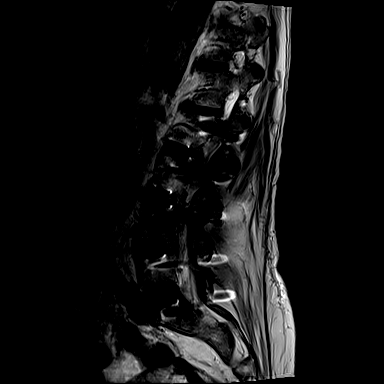
[im 11/17]
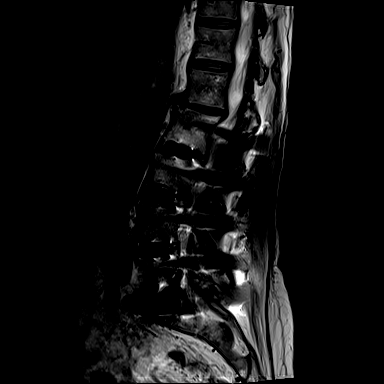
[im 17/17]
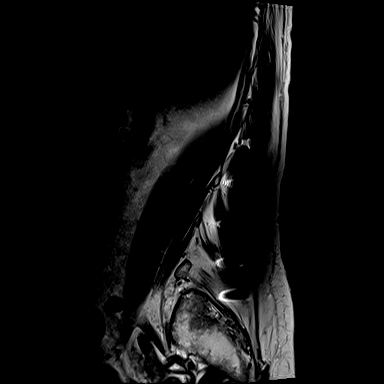

[Series 6: T1 · sagittal · 4.0mm · 0.84mm/px · 4 of 17 slices shown (1 of 2)]
[im 1/17]
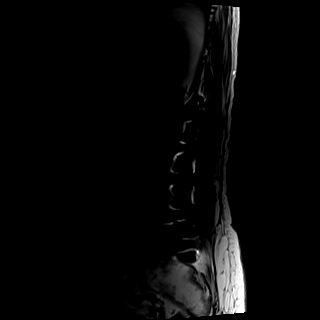
[im 6/17]
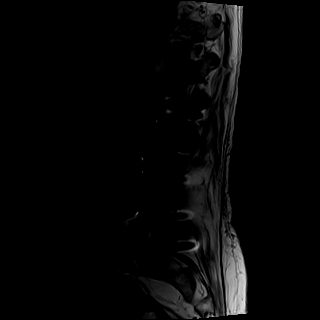
[im 11/17]
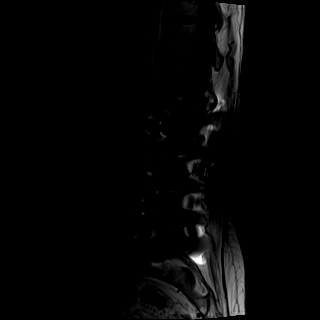
[im 17/17]
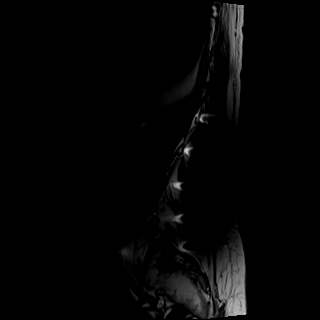

[Series 7: STIR · sagittal · 4.0mm · 0.84mm/px · 4 of 17 slices shown]
[im 1/17]
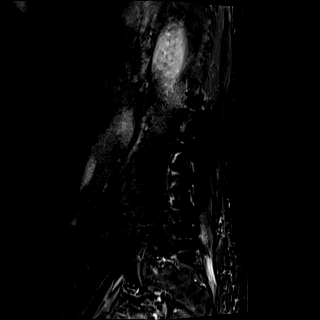
[im 6/17]
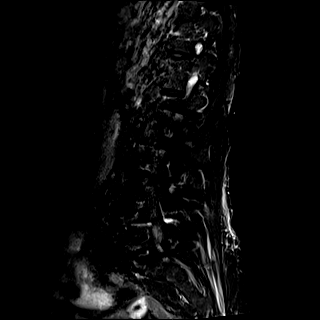
[im 11/17]
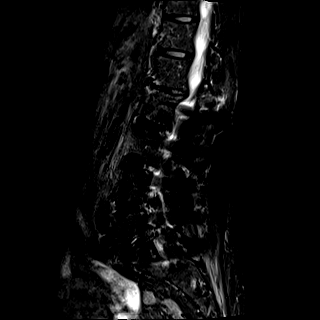
[im 17/17]
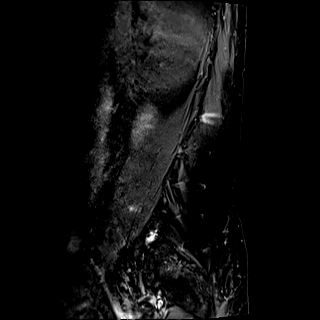

[Series 8: T2 · axial · 4.0mm · 0.74mm/px · z∈[-173,+47]mm · 10 of 51 slices shown (2 of 2)]
[im 1/51]
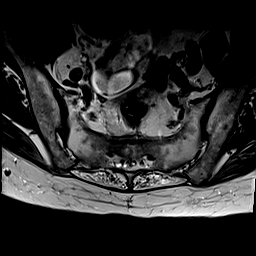
[im 6/51]
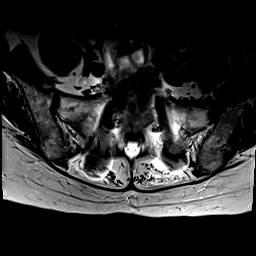
[im 12/51]
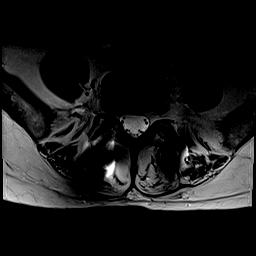
[im 17/51]
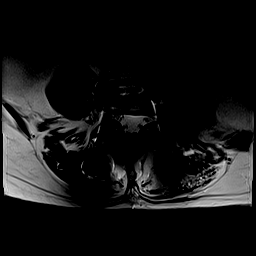
[im 23/51]
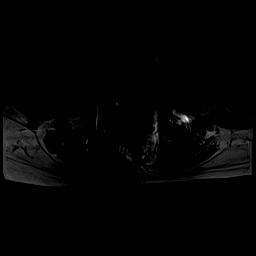
[im 28/51]
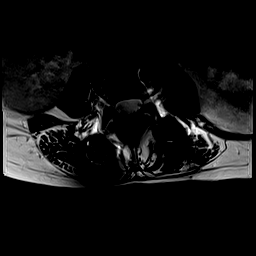
[im 34/51]
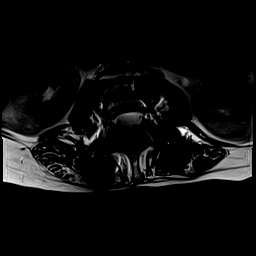
[im 39/51]
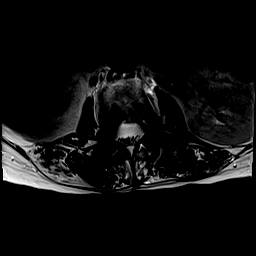
[im 45/51]
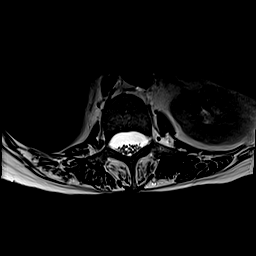
[im 51/51]
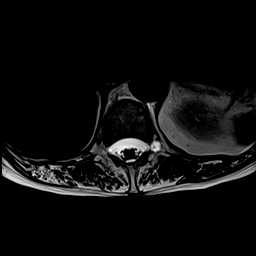

[Series 9: T1 · axial · 4.0mm · 0.74mm/px · z∈[-173,+47]mm · 10 of 51 slices shown (2 of 2)]
[im 1/51]
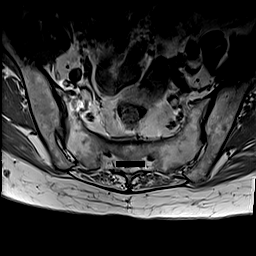
[im 6/51]
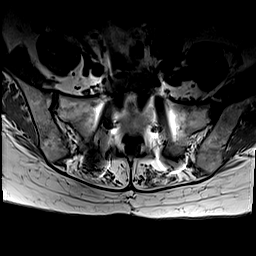
[im 12/51]
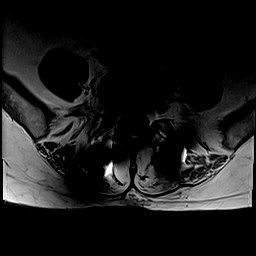
[im 17/51]
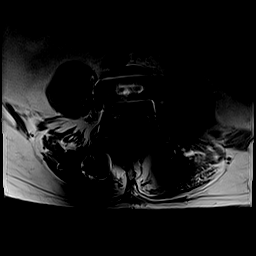
[im 23/51]
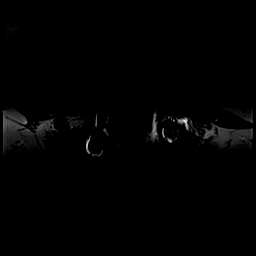
[im 28/51]
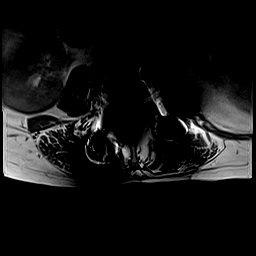
[im 34/51]
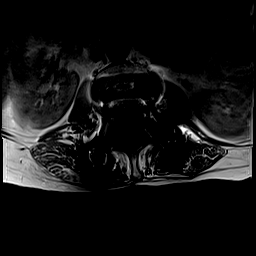
[im 39/51]
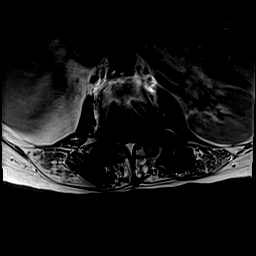
[im 45/51]
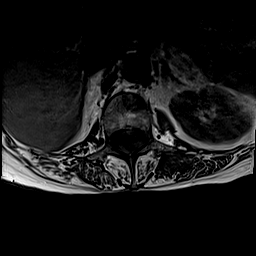
[im 51/51]
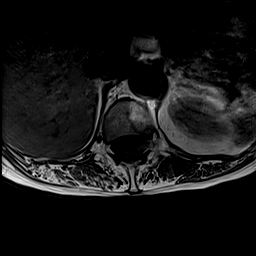

[Series 10: T2 post-contrast · sagittal · 4.0mm · 0.70mm/px · 3 of 17 slices shown]
[im 1/17]
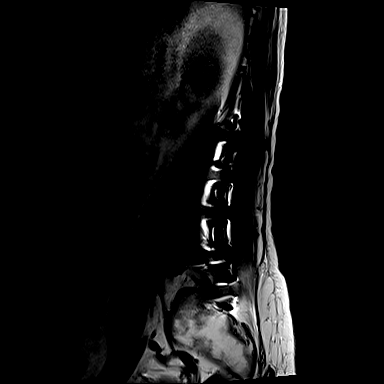
[im 9/17]
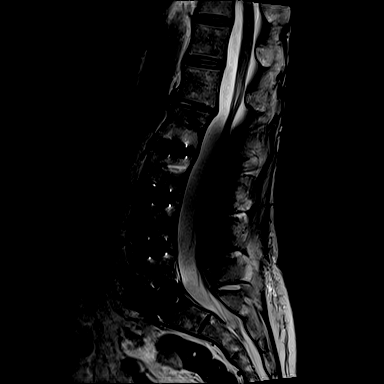
[im 17/17]
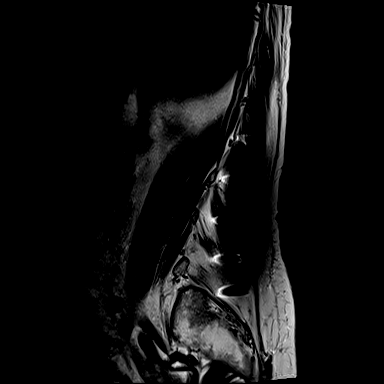

[Series 11: T1 post-contrast · axial · 4.0mm · 0.74mm/px · z∈[-173,+47]mm · 10 of 51 slices shown (1 of 2)]
[im 1/51]
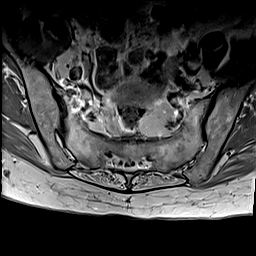
[im 6/51]
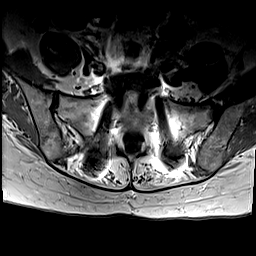
[im 12/51]
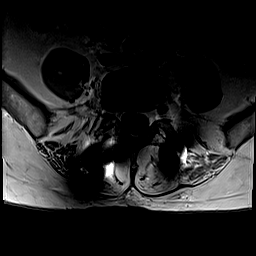
[im 17/51]
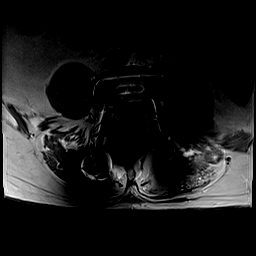
[im 23/51]
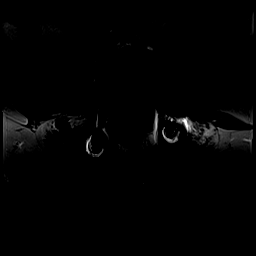
[im 28/51]
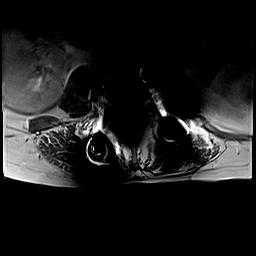
[im 34/51]
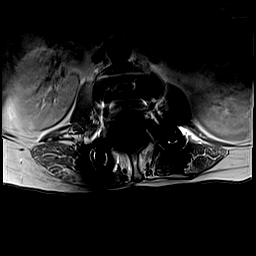
[im 39/51]
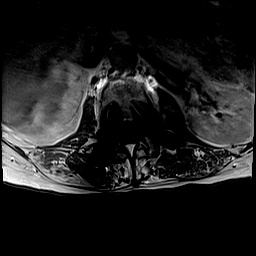
[im 45/51]
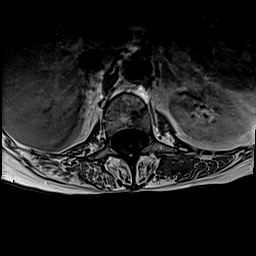
[im 51/51]
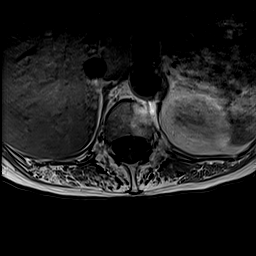

[Series 12: T1 post-contrast · sagittal · 4.0mm · 0.84mm/px · 3 of 17 slices shown (2 of 2)]
[im 1/17]
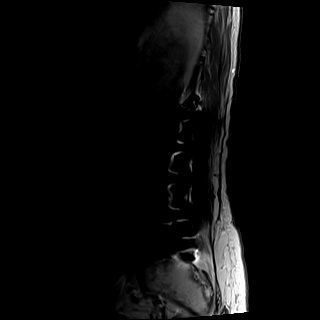
[im 9/17]
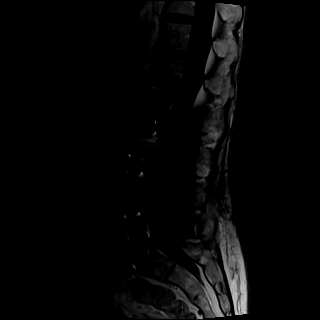
[im 17/17]
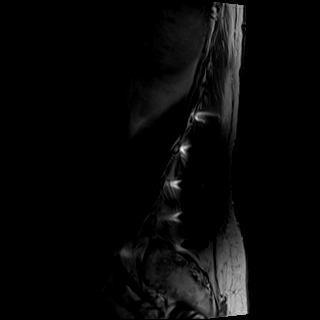

[48 of 48 positions shown; findings below may reference images not displayed]

FINDINGS: Segmentation:  5 lumbar type vertebral bodies.

Alignment: Since the last MRI, there has been discectomy and fusion
from L2 to the sacrum. Alignment is normal in that region. Minimal
kyphotic curvature at the L1-2 level.

Vertebrae:  Surgical changes L2-L5.  Otherwise negative.

Conus medullaris and cauda equina: Conus extends to the L1 level.
Conus and cauda equina appear normal.

Paraspinal and other soft tissues: Negative

Disc levels:

T12-L1: Normal

L1-2: Disc degeneration with loss of disc height, slightly
progressive since [DATE]. Disc bulge indents the thecal
sac slightly but does not appear to cause neural compression. Mild
facet edema. Findings at this level could certainly relate to back
pain. No neural compression.

L2 to sacrum: Good appearance with wide patency of the canal and
foramina. No operative complication is evident.
IMPRESSION: 1. Good appearance of the fusion segment from L2 to the sacrum.
2. L1-2: Progressive disc degeneration with loss of disc height and
bulging of the disc. No apparent compressive stenosis however. Mild
facet edema. Findings at this level could be associated with back
pain.

## 2020-09-29 MED ORDER — GADOBENATE DIMEGLUMINE 529 MG/ML IV SOLN
12.0000 mL | Freq: Once | INTRAVENOUS | Status: AC | PRN
  Administered 2020-09-29: 12 mL via INTRAVENOUS

## 2021-06-12 NOTE — Progress Notes (Signed)
 [facility] [address] [place], [place] [address]   CLINIC:  Medical Oncology/Hematology  CONSULT NOTE  Patient Care Team: Hungarland, John David, MD as PCP - General (Family Medicine) Katragadda, Sreedhar, MD as Medical Oncologist (Medical Oncology)  CHIEF COMPLAINTS/PURPOSE OF CONSULTATION:  Evaluation of left rib lesion  HISTORY OF PRESENTING ILLNESS:  [name] 70 y.o. female is here because of a rib lesion, at the request of Dr. Mahoney.  Today she reports feeling good. She reports she broke a left rib while sneezing 1 year ago which has since healed. She reports pain in her left-sided posterior ribs starting 1 month ago which is worsened with coughing and tender to the touch. She experiences increased pain in her back upon inspiration, but she is unsure if this is related to her new rib pain or if this is related to back surgery she had to the L2-5 vertebrae in June 2020; she takes Tramadol as needed for back pain. She has a history of osteoporosis for which she was receiving Evenity injections; following the injections she began to experience a itching rash on her arms and legs as well as easy bruising on her arms. She denies any current rash. She denies recent weight loss, fevers, and night sweats. 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 She reports swelling in her ankles. She denies tingling/numbness. She has a history of diverticulitis. Her maternal grandmother had colon cancer, and her mother had squamous cell skin cancer. 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 She denies smoking history and chemical exposure.   MEDICAL HISTORY:  Past Medical History:  Diagnosis Date   Arthritis    osteoarthritis   Back pain    Bronchiectasis (HCC)    Cancer (HCC)    basal cell nose   Chronic cough    Complication of anesthesia    Dyspnea    Gastrointestinal symptoms    GERD  (gastroesophageal reflux disease)    Glaucoma    History of hiatal hernia    Hyperlipemia    Hypertension    Osteopenia    Pneumonia    PONV (postoperative nausea and vomiting)    "only one time"   Spondylisthesis     SURGICAL HISTORY: Past Surgical History:  Procedure Laterality Date   ABDOMINAL EXPOSURE N/A 12/29/2018   Procedure: ABDOMINAL EXPOSURE;  Surgeon: Dickson, Christopher S, MD;  Location: [facility] OR;  Service: Vascular;  Laterality: N/A;   ABDOMINOPLASTY  2002   ABDOMINOPLASTY  1970's   ANKLE SURGERY  10/2015   ANTERIOR LATERAL LUMBAR FUSION WITH PERCUTANEOUS SCREW 3 LEVEL Left 12/29/2018   Procedure: Lumbar Two to Lumbar Five Anterolateral lumbar interbody fusion;  Surgeon: Stern, Joseph, MD;  Location: [facility] OR;  Service: Neurosurgery;  Laterality: Left;  anterolateral   ANTERIOR LUMBAR FUSION N/A 12/29/2018   Procedure: Lumbar Five Sacral One Anterior lumbar interbody fusion;  Surgeon: Stern, Joseph, MD;  Location: [facility] OR;  Service: Neurosurgery;  Laterality: N/A;  anterior approach   BASAL CELL CARCINOMA EXCISION  06/2018   nose   BLEPHAROPLASTY Bilateral 1999   BREAST ENHANCEMENT SURGERY  1970's   COLONOSCOPY     CYSTOURETHROSCOPY  2018   Doble J ureteal stents   DECOMPRESSION CORE HIP Left 2014   FACIAL COSMETIC SURGERY  2004   FLUOROSCOPY GUIDANCE     HIP ARTHROPLASTY Left    INCISIONAL HERNIA REPAIR N/A 10/08/2019     Procedure: OPEN INCISIONAL HERNIA REPAIR WITH MESH;  Surgeon: Gerkin, Todd, MD;  Location: [facility];  Service: General;  Laterality: N/A;   JOINT REPLACEMENT Left    05/2014   LAPAROSCOPIC PARTIAL COLECTOMY  06/26/2017   LUMBAR PERCUTANEOUS PEDICLE SCREW 4 LEVEL N/A 12/29/2018   Procedure: Lumbar Percutaneous Pedicle Screw Placement Lumbar two-Sacral one;  Surgeon: Stern, Joseph, MD;  Location: [facility] OR;  Service: Neurosurgery;  Laterality: N/A;   MOHS SURGERY  2019   nose   PARTIAL COLECTOMY  06/2017   for diverticulitis   REMOVAL OF BILATERAL TISSUE  EXPANDERS WITH PLACEMENT OF BILATERAL BREAST IMPLANTS  2004   THIGH LIFT  1994   TOE FUSION Right    great toe   TONSILLECTOMY     UMBILICAL HERNIA REPAIR     with tummy tuck   UMBILICAL HERNIA REPAIR  2002    SOCIAL HISTORY: Social History   Socioeconomic History   Marital status: Married    Spouse name: Not on file   Number of children: Not on file   Years of education: Not on file   Highest education level: Not on file  Occupational History   Not on file  Tobacco Use   Smoking status: Never   Smokeless tobacco: Never  Vaping Use   Vaping Use: Never used  Substance and Sexual Activity   Alcohol use: Yes    Comment: occasional   Drug use: Never   Sexual activity: Not on file  Other Topics Concern   Not on file  Social History Narrative   Not on file   Social Determinants of Health   Financial Resource Strain: Not on file  Food Insecurity: Not on file  Transportation Needs: Not on file  Physical Activity: Not on file  Stress: Not on file  Social Connections: Not on file  Intimate Partner Violence: Not on file    FAMILY HISTORY: Family History  Problem Relation Age of Onset   Hypertension Mother    COPD Mother    Hypertension Father    Heart disease Father     ALLERGIES:  is allergic to tobramycin-dexamethasone, ethambutol, neomycin-polymyxin-dexameth, amikacin, biaxin [clarithromycin], cefoxitin, sulfamethoxazole-trimethoprim, vancomycin, and bacitracin-polymyxin b.  MEDICATIONS:  Current Outpatient Medications  Medication Sig Dispense Refill   albuterol (VENTOLIN HFA) 108 (90 Base) MCG/ACT inhaler Inhale 2 puffs into the lungs every 6 (six) hours as needed for wheezing or shortness of breath.     amLODipine (NORVASC) 5 MG tablet Take 5 mg by mouth daily.     azithromycin (ZITHROMAX) 250 MG tablet Take 250 mg by mouth daily.     benzonatate (TESSALON) 100 MG capsule Take by mouth 3 (three) times daily as needed for cough.     Biotin 1000 MCG tablet  Take 1,000 mcg by mouth daily.     Calcium Carb-Cholecalciferol 600-800 MG-UNIT TABS Take 1 tablet by mouth daily.     dextromethorphan-guaiFENesin (MUCINEX DM) 30-600 MG 12hr tablet Take 1 tablet by mouth daily.      estrogen, conjugated,-medroxyprogesterone (PREMPRO) 0.45-1.5 MG tablet Take 1 tablet by mouth daily.     fluticasone (FLONASE) 50 MCG/ACT nasal spray Place 1 spray into both nostrils daily.     gabapentin (NEURONTIN) 300 MG capsule Take 1 capsule (300 mg total) by mouth 3 (three) times daily. 60 capsule 5   hydrochlorothiazide (HYDRODIURIL) 25 MG tablet Take 25 mg by mouth daily.     latanoprost (XALATAN) 0.005 % ophthalmic solution Place 1 drop into both eyes at   bedtime.      linaclotide (LINZESS) 145 MCG CAPS capsule Take 145 mcg by mouth daily before breakfast.     lisinopril (ZESTRIL) 20 MG tablet Take 20 mg by mouth daily.     loratadine (CLARITIN) 10 MG tablet Take 10 mg by mouth daily.     methocarbamol (ROBAXIN) 500 MG tablet Take 1 tablet (500 mg total) by mouth every 6 (six) hours as needed for muscle spasms. 40 tablet 3   ondansetron (ZOFRAN ODT) 4 MG disintegrating tablet Take 1 tablet (4 mg total) by mouth every 4 (four) hours as needed for nausea or vomiting. 20 tablet 0   pantoprazole (PROTONIX) 20 MG tablet Take 1 tablet (20 mg total) by mouth daily. 30 tablet 0   pantoprazole (PROTONIX) 40 MG tablet Take 40 mg by mouth daily.     SUMAtriptan (IMITREX) 100 MG tablet Take 100 mg by mouth every 2 (two) hours as needed for migraine. May repeat in 2 hours if headache persists or recurs.     ciprofloxacin (CIPRO) 500 MG tablet Take 1 tablet (500 mg total) by mouth 2 (two) times daily. (Patient not taking: Reported on 09/28/2019) 15 tablet 0   HYDROcodone-acetaminophen (NORCO/VICODIN) 5-325 MG tablet Take 1-2 tablets by mouth every 4 (four) hours as needed for moderate pain or severe pain ((score 7 to 10)). (Patient not taking: Reported on 06/13/2021) 60 tablet 0    telmisartan (MICARDIS) 20 MG tablet Take 20 mg by mouth daily.     traMADol (ULTRAM) 50 MG tablet Take 1 tablet (50 mg total) by mouth every 6 (six) hours as needed for moderate pain or severe pain. (Patient not taking: Reported on 06/13/2021) 20 tablet 0   No current facility-administered medications for this visit.    REVIEW OF SYSTEMS:   Review of Systems  Constitutional:  Positive for fatigue (25%). Negative for appetite change.  Respiratory:  Positive for cough and shortness of breath.   Gastrointestinal:  Positive for constipation.  Musculoskeletal:  Positive for back pain (L side).  Skin:  Negative for rash.  Neurological:  Negative for numbness.  Hematological:  Bruises/bleeds easily.  All other systems reviewed and are negative.   PHYSICAL EXAMINATION: ECOG PERFORMANCE STATUS: 1 - Symptomatic but completely ambulatory  Vitals:   06/13/21 0807  BP: 125/64  Pulse: 95  Resp: 18  Temp: (!) 97.3 F (36.3 C)  SpO2: 99%   Filed Weights   06/13/21 0807  Weight: 136 lb 6.4 oz (61.9 kg)   Physical Exam Vitals reviewed.  Constitutional:      Appearance: Normal appearance.  Cardiovascular:     Rate and Rhythm: Normal rate and regular rhythm.     Pulses: Normal pulses.     Heart sounds: Normal heart sounds.  Pulmonary:     Effort: Pulmonary effort is normal.     Breath sounds: Normal breath sounds.  Abdominal:     Palpations: Abdomen is soft. There is no hepatomegaly, splenomegaly or mass.     Tenderness: There is no abdominal tenderness.  Musculoskeletal:     Right lower leg: No edema.     Left lower leg: No edema.  Lymphadenopathy:     Cervical: No cervical adenopathy.     Right cervical: No superficial cervical adenopathy.    Left cervical: No superficial cervical adenopathy.     Upper Body:     Right upper body: No supraclavicular adenopathy.     Left upper body: No supraclavicular adenopathy.       Lower Body: No right inguinal adenopathy. No left inguinal  adenopathy.  Neurological:     General: No focal deficit present.     Mental Status: She is alert and oriented to person, place, and time.  Psychiatric:        Mood and Affect: Mood normal.        Behavior: Behavior normal.     LABORATORY DATA:  I have reviewed the data as listed CBC Latest Ref Rng & Units 10/01/2019 01/19/2019 01/01/2019  WBC 4.0 - 10.5 K/uL 5.2 9.9 12.0(H)  Hemoglobin 12.0 - 15.0 g/dL 12.8 11.1(L) 9.8(L)  Hematocrit 36.0 - 46.0 % 40.3 34.7(L) 28.6(L)  Platelets 150 - 400 K/uL 374 697(H) 210   CMP Latest Ref Rng & Units 10/01/2019 01/19/2019 12/31/2018  Glucose 70 - 99 mg/dL 81 130(H) 117(H)  BUN 8 - 23 mg/dL 15 7(L) 6(L)  Creatinine 0.44 - 1.00 mg/dL 0.64 0.74 0.58  Sodium 135 - 145 mmol/L 137 128(L) 131(L)  Potassium 3.5 - 5.1 mmol/L 4.5 4.1 3.8  Chloride 98 - 111 mmol/L 99 91(L) 99  CO2 22 - 32 mmol/L 30 23 24  Calcium 8.9 - 10.3 mg/dL 9.4 9.2 7.9(L)  Total Protein 6.5 - 8.1 g/dL - 7.7 -  Total Bilirubin 0.3 - 1.2 mg/dL - 0.7 -  Alkaline Phos 38 - 126 U/L - 85 -  AST 15 - 41 U/L - 19 -  ALT 0 - 44 U/L - 14 -    RADIOGRAPHIC STUDIES: I have personally reviewed the radiological images as listed and agreed with the findings in the report. No results found.  ASSESSMENT:  Left posterior seventh rib lesion: - Patient seen at the request of Dr. Steve Mahoney - She reported discomfort in the left posterior back for the last 1 month.  She was seen by Dr. Mahoney and x-rays showed abnormality.  A CT scan of the chest was done on 05/24/2021. - CT chest report reviewed by me from 05/24/2021 showed expansile oval-shaped lesion within the posterior aspect of the seventh rib with a nodule measuring 2.1 x 1.1 cm.  Adjacent cortex demonstrates area of thinning and destruction.  Stable emphysematous and interstitial changes within the lungs.  Stable pulmonary nodules within the right upper lobe and left upper lobe when compared to CT from September 2020. - CT of the abdomen and  pelvis did not show any significant abnormalities. - She denies any fevers, night sweats or weight loss in the last 6 months.  She had history of basal cell carcinoma on the nose removed by Mohs surgery. - She also reported history of osteoporosis and broke left upper rib 1 year ago after sneezing.   Social/family history: - She is married and lives at home with her husband.  She has not worked but had help her husband and his work.  She is a non-smoker. - Maternal grandmother had colon cancer.  Mother had a squamous cell carcinoma of the skin.   PLAN:  Left posterior seventh rib lesion: - No other bone lesions were reported on the CT.  I will review the images. - We discussed differential diagnosis in detail. - Recommend work-up including SPEP, immunofixation, free light chains. - Recommend PET CT scan. - Recommend CT guided biopsy of the rib lesion. - If there is any evidence of multiple myeloma, will also consider bone marrow aspiration and biopsy. - RTC after the biopsy.   All questions were answered. The patient knows to call the clinic with any   problems, questions or concerns.   Sreedhar Katragadda, MD, 06/13/21 8:49 AM  [facility] [phone]   I, Kirstyn Evans, am acting as a scribe for Dr. Sreedhar Katragadda.  I, Sreedhar Katragadda MD, have reviewed the above documentation for accuracy and completeness, and I agree with the above.     

## 2021-06-13 ENCOUNTER — Inpatient Hospital Stay (HOSPITAL_COMMUNITY): Payer: Medicare Other | Attending: Hematology | Admitting: Hematology

## 2021-06-13 ENCOUNTER — Inpatient Hospital Stay (HOSPITAL_COMMUNITY): Payer: Medicare Other

## 2021-06-13 ENCOUNTER — Encounter (HOSPITAL_COMMUNITY): Payer: Self-pay | Admitting: Hematology

## 2021-06-13 ENCOUNTER — Other Ambulatory Visit: Payer: Self-pay

## 2021-06-13 VITALS — BP 125/64 | HR 95 | Temp 97.3°F | Resp 18 | Ht 64.0 in | Wt 136.4 lb

## 2021-06-13 DIAGNOSIS — Z808 Family history of malignant neoplasm of other organs or systems: Secondary | ICD-10-CM | POA: Diagnosis not present

## 2021-06-13 DIAGNOSIS — Z840 Family history of diseases of the skin and subcutaneous tissue: Secondary | ICD-10-CM

## 2021-06-13 DIAGNOSIS — Z85828 Personal history of other malignant neoplasm of skin: Secondary | ICD-10-CM | POA: Insufficient documentation

## 2021-06-13 DIAGNOSIS — Z800 Family history of malignant neoplasm of digestive organs: Secondary | ICD-10-CM | POA: Insufficient documentation

## 2021-06-13 DIAGNOSIS — M899 Disorder of bone, unspecified: Secondary | ICD-10-CM | POA: Diagnosis present

## 2021-06-13 LAB — CBC WITH DIFFERENTIAL/PLATELET
Abs Immature Granulocytes: 0.01 10*3/uL (ref 0.00–0.07)
Basophils Absolute: 0 10*3/uL (ref 0.0–0.1)
Basophils Relative: 1 %
Eosinophils Absolute: 0.2 10*3/uL (ref 0.0–0.5)
Eosinophils Relative: 5 %
HCT: 37.3 % (ref 36.0–46.0)
Hemoglobin: 12.2 g/dL (ref 12.0–15.0)
Immature Granulocytes: 0 %
Lymphocytes Relative: 29 %
Lymphs Abs: 1.2 10*3/uL (ref 0.7–4.0)
MCH: 31.4 pg (ref 26.0–34.0)
MCHC: 32.7 g/dL (ref 30.0–36.0)
MCV: 95.9 fL (ref 80.0–100.0)
Monocytes Absolute: 0.3 10*3/uL (ref 0.1–1.0)
Monocytes Relative: 6 %
Neutro Abs: 2.5 10*3/uL (ref 1.7–7.7)
Neutrophils Relative %: 59 %
Platelets: 340 10*3/uL (ref 150–400)
RBC: 3.89 MIL/uL (ref 3.87–5.11)
RDW: 14.3 % (ref 11.5–15.5)
WBC: 4.2 10*3/uL (ref 4.0–10.5)
nRBC: 0 % (ref 0.0–0.2)

## 2021-06-13 LAB — COMPREHENSIVE METABOLIC PANEL WITH GFR
ALT: 23 U/L (ref 0–44)
AST: 26 U/L (ref 15–41)
Albumin: 4.2 g/dL (ref 3.5–5.0)
Alkaline Phosphatase: 75 U/L (ref 38–126)
Anion gap: 9 (ref 5–15)
BUN: 19 mg/dL (ref 8–23)
CO2: 28 mmol/L (ref 22–32)
Calcium: 9.6 mg/dL (ref 8.9–10.3)
Chloride: 96 mmol/L — ABNORMAL LOW (ref 98–111)
Creatinine, Ser: 0.72 mg/dL (ref 0.44–1.00)
GFR, Estimated: >60 mL/min
Glucose, Bld: 94 mg/dL (ref 70–99)
Potassium: 4.5 mmol/L (ref 3.5–5.1)
Sodium: 133 mmol/L — ABNORMAL LOW (ref 135–145)
Total Bilirubin: 0.3 mg/dL (ref 0.3–1.2)
Total Protein: 8.8 g/dL — ABNORMAL HIGH (ref 6.5–8.1)

## 2021-06-13 LAB — LACTATE DEHYDROGENASE: LDH: 120 U/L (ref 98–192)

## 2021-06-13 NOTE — Patient Instructions (Addendum)
[place] at [facility] Discharge Instructions  You were seen and examined today by Dr. Delton Coombes. Dr. Delton Coombes is a medical oncologist, meaning that he specializes in management of cancer diagnoses with medications. Dr. Delton Coombes discussed your past medical history, family history of cancers, and the events that led to you being here today.  You were referred to Dr. Delton Coombes by Dr. Chauncey Reading due to an abnormal CT scan. Your recent CT scan revealed an area of rib destruction that could be concerning for malignancy (cancer).  Dr. Delton Coombes has recommended lab work today. Dr. Delton Coombes also recommends a PET scan. A PET scan is a specialized CT scan that illuminates where there is cancer present in the body. Dr. Delton Coombes will also request a biopsy.  The lab work today will be utilized to indicate if there is a concern for Lymphoma or Multiple Myeloma.  Follow-up as scheduled.   Thank you for choosing [place] at [facility] to provide your oncology and hematology care.  To afford each patient quality time with our provider, please arrive at least 15 minutes before your scheduled appointment time.   If you have a lab appointment with the [place] please come in thru the Main Entrance and check in at the main information desk.  You need to re-schedule your appointment should you arrive 10 or more minutes late.  We strive to give you quality time with our providers, and arriving late affects you and other patients whose appointments are after yours.  Also, if you no show three or more times for appointments you may be dismissed from the clinic at the providers discretion.     Again, thank you for choosing [facility].  Our hope is that these requests will decrease the amount of time that you wait before being seen by our physicians.       _____________________________________________________________  Should you have questions  after your visit to [facility], please contact our office at [phone] and follow the prompts.  Our office hours are 8:00 a.m. and 4:30 p.m. Monday - Friday.  Please note that voicemails left after 4:00 p.m. may not be returned until the following business day.  We are closed weekends and major holidays.  You do have access to a nurse 24-7, just call the main number to the clinic [phone] and do not press any options, hold on the line and a nurse will answer the phone.    For prescription refill requests, have your pharmacy contact our office and allow 72 hours.    Due to Covid, you will need to wear a mask upon entering the hospital. If you do not have a mask, a mask will be given to you at the Main Entrance upon arrival. For doctor visits, patients may have 1 support person age 30 or older with them. For treatment visits, patients can not have anyone with them due to social distancing guidelines and our immunocompromised population.

## 2021-06-14 ENCOUNTER — Encounter (HOSPITAL_COMMUNITY)
Admission: RE | Admit: 2021-06-14 | Discharge: 2021-06-14 | Disposition: A | Discharge status: Home / Self Care | Payer: Medicare Other | Source: Ambulatory Visit | Attending: Hematology | Admitting: Hematology

## 2021-06-14 ENCOUNTER — Ambulatory Visit
Admission: RE | Admit: 2021-06-14 | Discharge: 2021-06-14 | Disposition: A | Discharge status: Home / Self Care | Payer: Self-pay | Source: Ambulatory Visit | Attending: Hematology | Admitting: Hematology

## 2021-06-14 ENCOUNTER — Other Ambulatory Visit (HOSPITAL_COMMUNITY): Payer: Self-pay | Admitting: Hematology

## 2021-06-14 DIAGNOSIS — M899 Disorder of bone, unspecified: Secondary | ICD-10-CM

## 2021-06-14 LAB — KAPPA/LAMBDA LIGHT CHAINS
Kappa free light chain: 71.5 mg/L — ABNORMAL HIGH (ref 3.3–19.4)
Kappa, lambda light chain ratio: 8.22 — ABNORMAL HIGH (ref 0.26–1.65)
Lambda free light chains: 8.7 mg/L (ref 5.7–26.3)

## 2021-06-14 IMAGING — CT NM PET TUM IMG INITIAL (PI) SKULL BASE T - THIGH
1 of 8 series · 1 of 25 positions shown · non-contrast
Comparison: Outside CT scan [DATE]

CLINICAL DATA: Initial treatment strategy for left seventh rib
lesion. History of bladder cancer.

EXAM:
NUCLEAR MEDICINE PET SKULL BASE TO THIGH
TECHNIQUE: 5.7 mCi F-18 FDG was injected intravenously. Full-ring PET imaging
was performed from the skull base to thigh after the radiotracer. CT
data was obtained and used for attenuation correction and anatomic
localization.
Fasting blood glucose: 146 mg/dl

[Series 3: ctac · axial · 3.0mm · 0.98mm/px · 1 of 293 slices shown]
[im 293/293  brain]
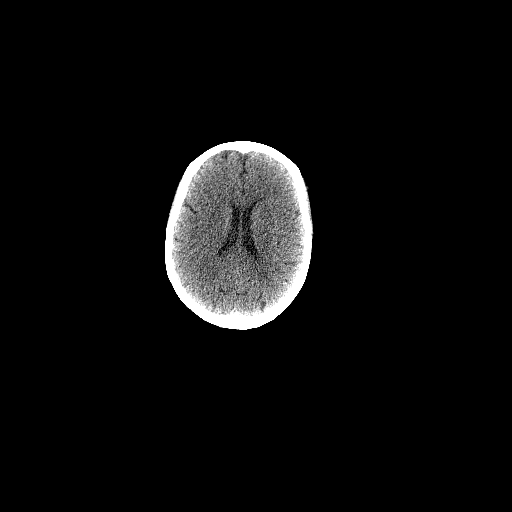

[1 of 25 positions shown; findings below may reference images not displayed]

FINDINGS: Mediastinal blood pool activity: SUV max

Liver activity: SUV max NA

NECK: No hypermetabolic lymph nodes in the neck.

Incidental CT findings: none

CHEST: Moderate brown fat activity noted in the supraclavicular
regions, mediastinum and intercostal areas.

No chest wall mass, supraclavicular or axillary adenopathy. No
mediastinal or hilar mass or adenopathy.

No worrisome hypermetabolic pulmonary lesions to suggest primary
lung neoplasm.

Incidental CT findings: Emphysematous changes and pulmonary scarring
with some areas of bronchiectasis. Stable branching right middle
lobe process likely mucoid impaction.

ABDOMEN/PELVIS: No abnormal hypermetabolic activity within the
liver, pancreas, adrenal glands, or spleen. No hypermetabolic lymph
nodes in the abdomen or pelvis.

Incidental CT findings: Scattered vascular calcifications. No
aneurysm.

SKELETON: Surgical changes related to lumbar fusion. There is mild
hypermetabolism involving the left aspect of the L5 vertebral body
and also the pedicle region surrounding the screw. SUV max is 4.2.
There appears to be a lytic process on the CT scan.

The lytic left posterior seventh rib lesion is mildly hypermetabolic
with SUV max of 2.98.

I do not see any other definite lytic hypermetabolic bone lesions.
Findings could be due to metastatic disease or possible myeloma.
Recommend biopsy.

Incidental CT findings: none
IMPRESSION: There are 2 lytic hypermetabolic bone lesions. This involves the
left posterior seventh rib and the left aspect of the L5 vertebral
body. No obvious primary tumor is identified. This still could
represent metastatic disease. Myeloma would also be possible.
Recommend biopsy.

## 2021-06-14 MED ORDER — FLUDEOXYGLUCOSE F - 18 (FDG) INJECTION
5.7480 | Freq: Once | INTRAVENOUS | Status: AC | PRN
  Administered 2021-06-14: 5.748 via INTRAVENOUS

## 2021-06-15 LAB — PROTEIN ELECTROPHORESIS, SERUM
A/G Ratio: 0.8 (ref 0.7–1.7)
Albumin ELP: 3.8 g/dL (ref 2.9–4.4)
Alpha-1-Globulin: 0.2 g/dL (ref 0.0–0.4)
Alpha-2-Globulin: 0.8 g/dL (ref 0.4–1.0)
Beta Globulin: 3.3 g/dL — ABNORMAL HIGH (ref 0.7–1.3)
Gamma Globulin: 0.3 g/dL — ABNORMAL LOW (ref 0.4–1.8)
Globulin, Total: 4.7 g/dL — ABNORMAL HIGH (ref 2.2–3.9)
M-Spike, %: 1.7 g/dL — ABNORMAL HIGH
Total Protein ELP: 8.5 g/dL (ref 6.0–8.5)

## 2021-06-18 ENCOUNTER — Encounter (HOSPITAL_COMMUNITY): Payer: Self-pay | Admitting: Radiology

## 2021-06-18 ENCOUNTER — Telehealth (HOSPITAL_COMMUNITY): Payer: Self-pay | Admitting: *Deleted

## 2021-06-18 LAB — IMMUNOFIXATION ELECTROPHORESIS
IgA: 3727 mg/dL — ABNORMAL HIGH (ref 64–422)
IgG (Immunoglobin G), Serum: 718 mg/dL (ref 586–1602)
IgM (Immunoglobulin M), Srm: 33 mg/dL (ref 26–217)
Total Protein ELP: 8.7 g/dL — ABNORMAL HIGH (ref 6.0–8.5)

## 2021-06-18 NOTE — Progress Notes (Signed)
Patient Name  Amanda, Conner Legal Sex  Female DOB  24-Oct-1950 SSN  [id number] Address  [address]  [place] [address] Phone  [phone] (Home) *[phone] (Mobile)    RE: CT Biopsy Received: 3 days ago Markus Daft, MD  Arlyn Leak for CT guided biopsy of left 7th rib lesion.   [place]        Previous Messages   ----- Message -----  From: Garth Bigness D  Sent: 06/15/2021   6:25 PM EST  To: [facility] Procedure Requests  Subject: CT Biopsy                                       Procedure:  CT Biopsy   Reason:  Bone lesion, L 7th rib blastic lesion   History:   Outside CT in Pac's, NM PET in computer   Provider:  Derek Jack   Provider Contact:  [phone]

## 2021-06-18 NOTE — Telephone Encounter (Signed)
Patient called to update her medication list.

## 2021-06-22 ENCOUNTER — Other Ambulatory Visit: Payer: Self-pay | Admitting: Physician Assistant

## 2021-06-25 ENCOUNTER — Ambulatory Visit (HOSPITAL_COMMUNITY)
Admission: RE | Admit: 2021-06-25 | Discharge: 2021-06-25 | Disposition: A | Discharge status: Home / Self Care | Payer: Medicare Other | Source: Ambulatory Visit | Attending: Hematology | Admitting: Hematology

## 2021-06-25 ENCOUNTER — Encounter (HOSPITAL_COMMUNITY): Payer: Self-pay

## 2021-06-25 ENCOUNTER — Other Ambulatory Visit: Payer: Self-pay

## 2021-06-25 DIAGNOSIS — C9000 Multiple myeloma not having achieved remission: Secondary | ICD-10-CM | POA: Diagnosis not present

## 2021-06-25 DIAGNOSIS — M899 Disorder of bone, unspecified: Secondary | ICD-10-CM | POA: Diagnosis present

## 2021-06-25 DIAGNOSIS — R9389 Abnormal findings on diagnostic imaging of other specified body structures: Secondary | ICD-10-CM | POA: Insufficient documentation

## 2021-06-25 DIAGNOSIS — Z9889 Other specified postprocedural states: Secondary | ICD-10-CM

## 2021-06-25 LAB — CBC
HCT: 39.6 % (ref 36.0–46.0)
Hemoglobin: 13.1 g/dL (ref 12.0–15.0)
MCH: 31 pg (ref 26.0–34.0)
MCHC: 33.1 g/dL (ref 30.0–36.0)
MCV: 93.8 fL (ref 80.0–100.0)
Platelets: 433 10*3/uL — ABNORMAL HIGH (ref 150–400)
RBC: 4.22 MIL/uL (ref 3.87–5.11)
RDW: 14.6 % (ref 11.5–15.5)
WBC: 4.3 10*3/uL (ref 4.0–10.5)
nRBC: 0 % (ref 0.0–0.2)

## 2021-06-25 IMAGING — DX DG CHEST 1V PORT
1 series · 1 of 1 positions shown · non-contrast
Comparison: [DATE] PET-CT

CLINICAL DATA: Status post left seventh rib biopsy

EXAM:
PORTABLE CHEST 1 VIEW

[chest]
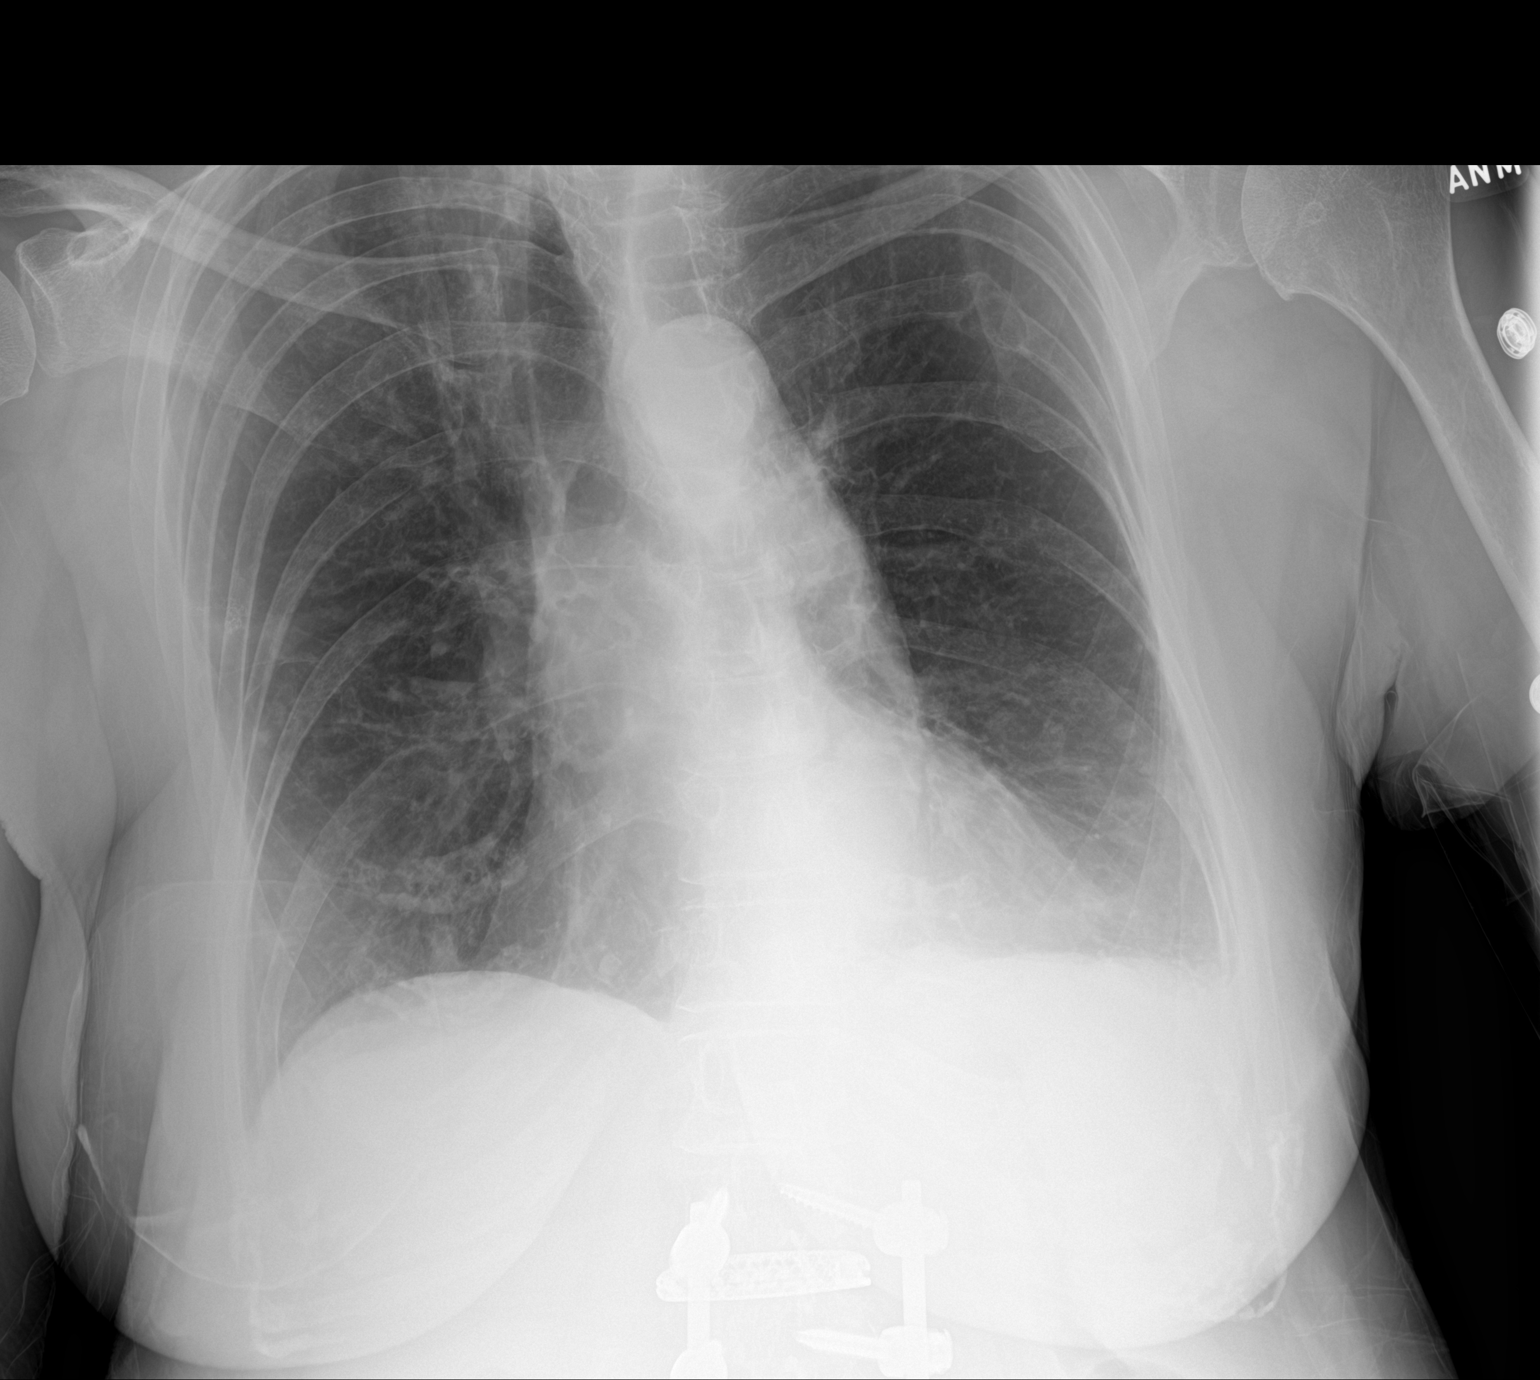

[1 of 1 positions shown; findings below may reference images not displayed]

FINDINGS: Top-normal heart size. Mildly tortuous atherosclerotic thoracic
aorta. Otherwise normal mediastinal contour. No pneumothorax. No
pleural effusions. Stable mild blunting of the left costophrenic
angle compatible with chronic mild pleural-parenchymal scarring as
seen on prior CT images. No pulmonary edema. No acute consolidative
airspace disease. Stable lytic mildly expansile posterior left
seventh rib lesion. Partially visualized bilateral posterior spinal
fusion hardware in the lumbar spine.
IMPRESSION: 1. No pneumothorax. No active cardiopulmonary disease.
2. Stable lytic mildly expansile posterior left seventh rib lesion.

## 2021-06-25 IMAGING — CT CT BIOPSY CORE BONE DEEP
1 of 5 series · 13 of 32 positions shown, 19 images · non-contrast
Comparison: none

INDICATION: 70-year-old woman with history of bladder cancer presents to IR for
CT guided biopsy of left seventh rib lytic lesion.

[Series 2: i-spiral 5.0 b40f · axial · 0.74mm/px · z∈[+1043,+1274]mm · 13 of 78 slices shown, 19 images]
[im 6/78  soft-tissue]
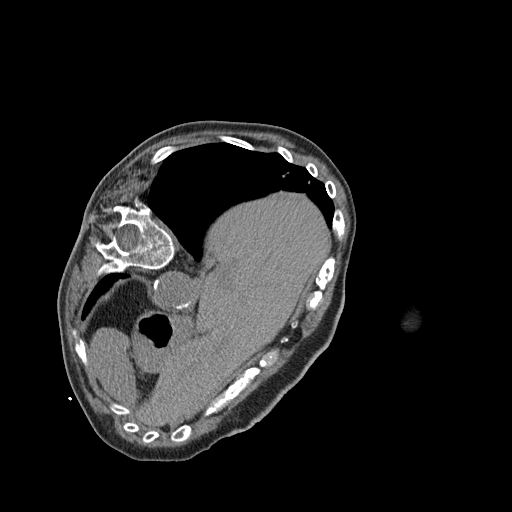
[im 6/78  bone]
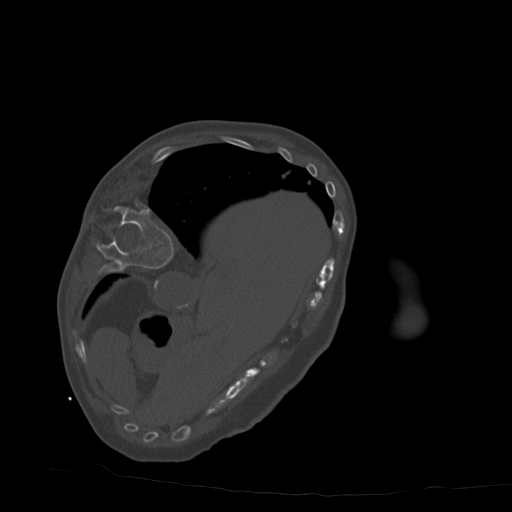
[im 12/78  soft-tissue]
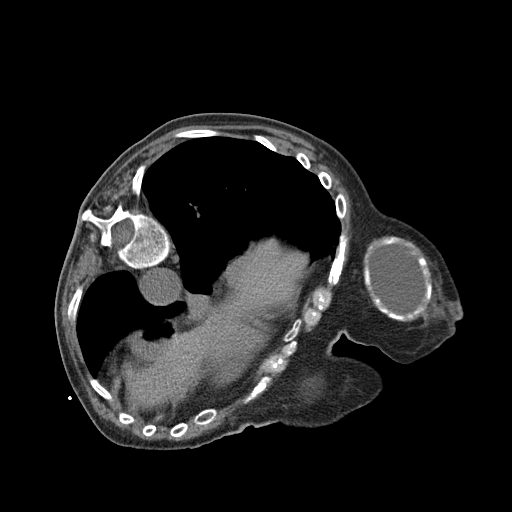
[im 17/78  soft-tissue]
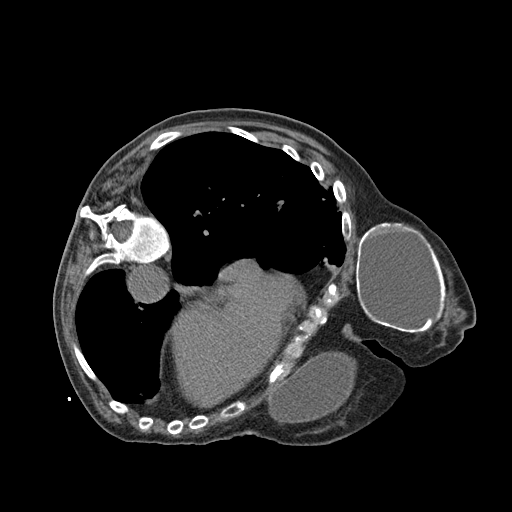
[im 23/78  soft-tissue]
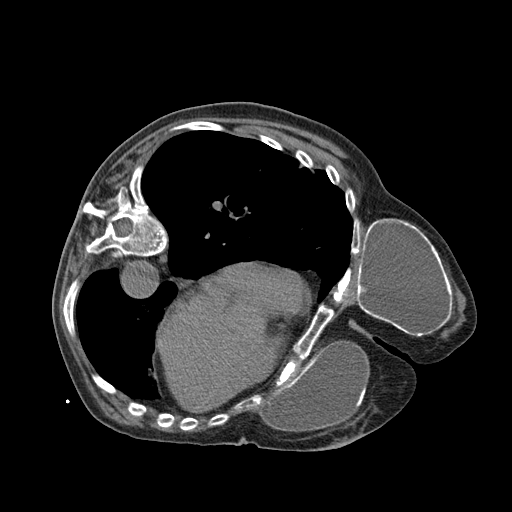
[im 28/78  soft-tissue]
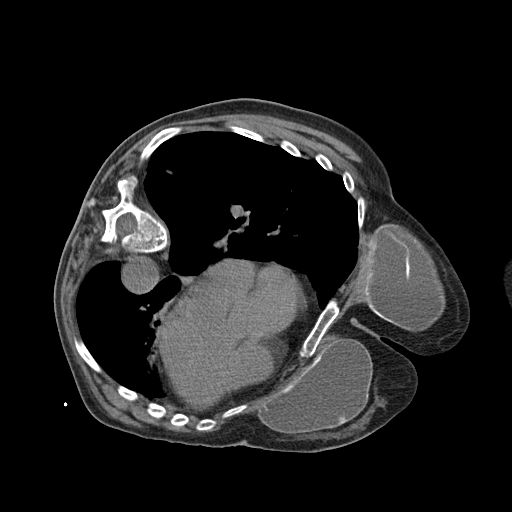
[im 34/78  soft-tissue]
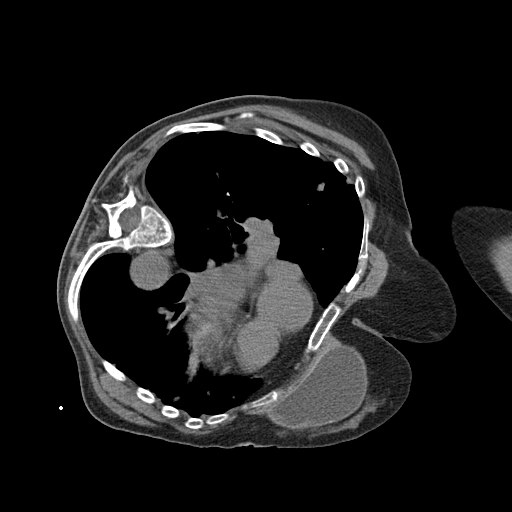
[im 39/78  soft-tissue]
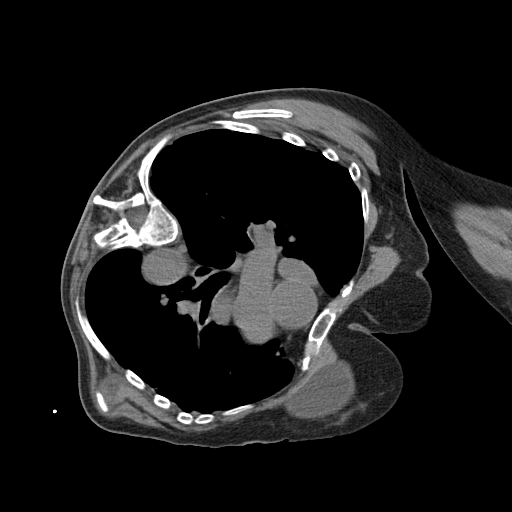
[im 45/78  soft-tissue]
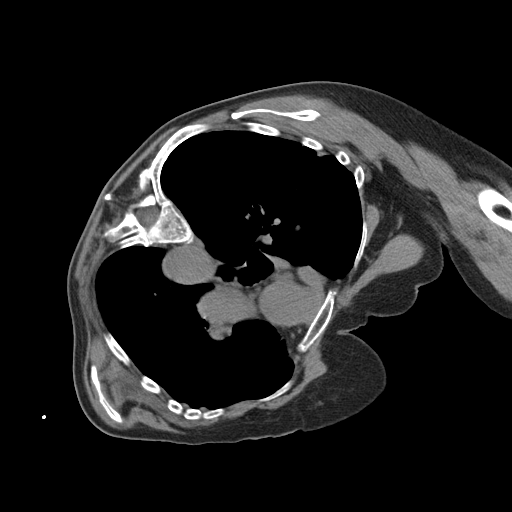
[im 50/78  soft-tissue]
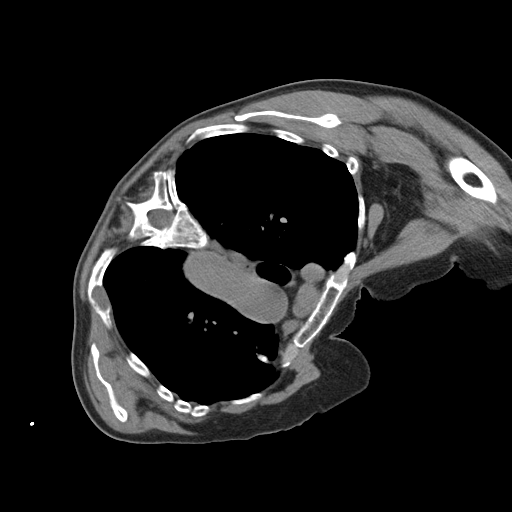
[im 50/78  bone]
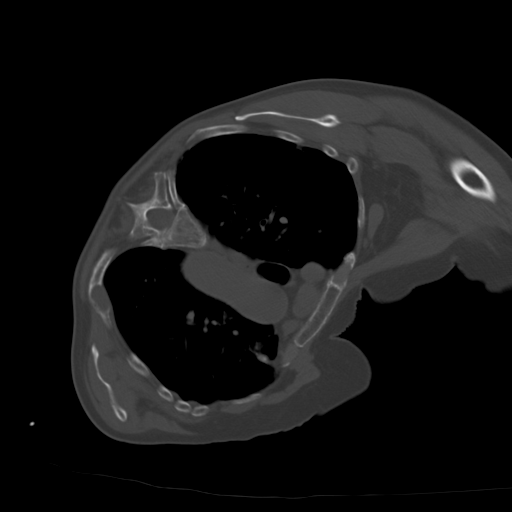
[im 56/78  soft-tissue]
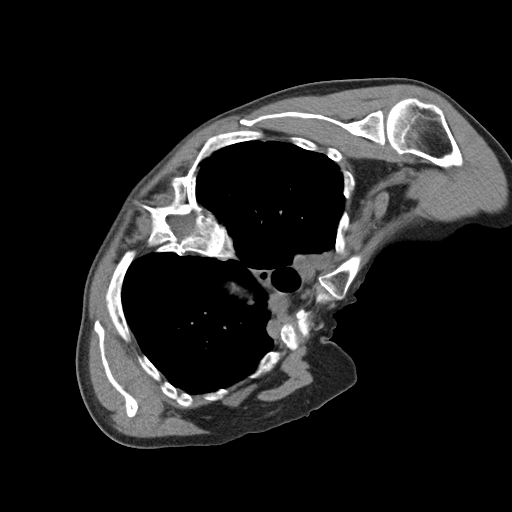
[im 56/78  lung]
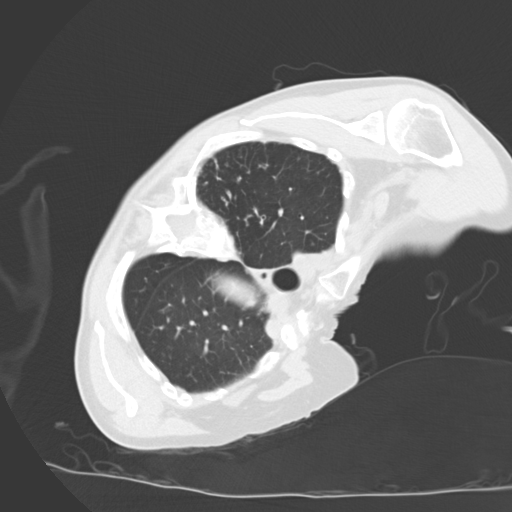
[im 61/78  soft-tissue]
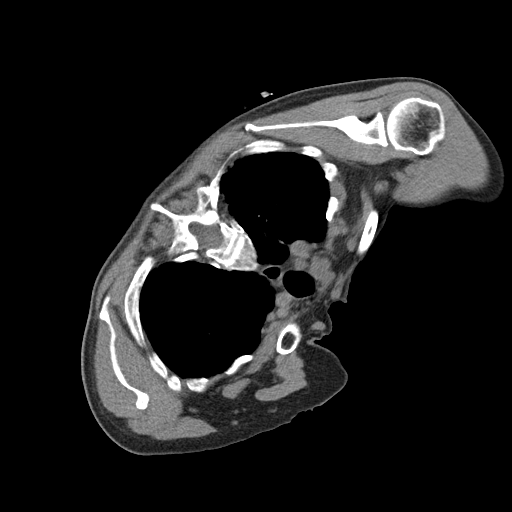
[im 61/78  lung]
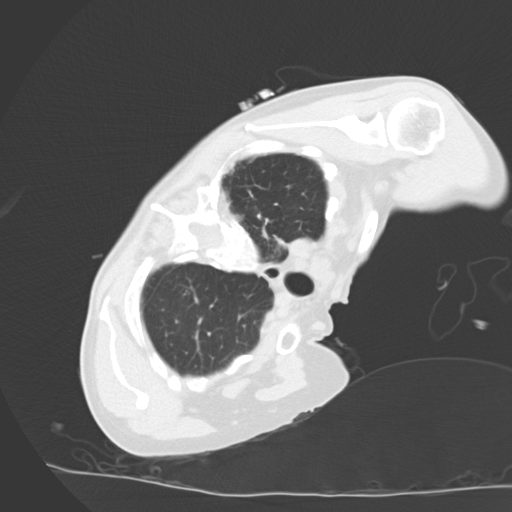
[im 67/78  soft-tissue]
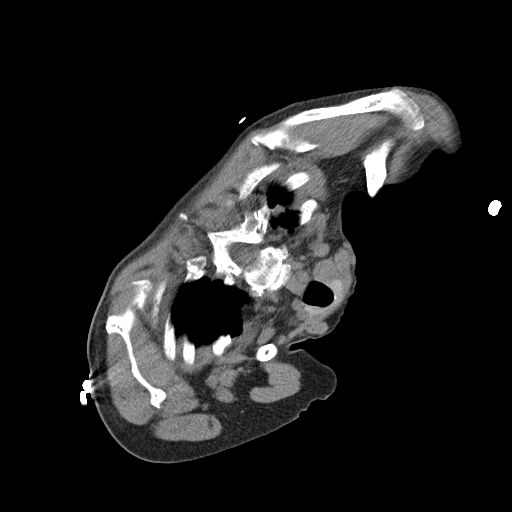
[im 67/78  lung]
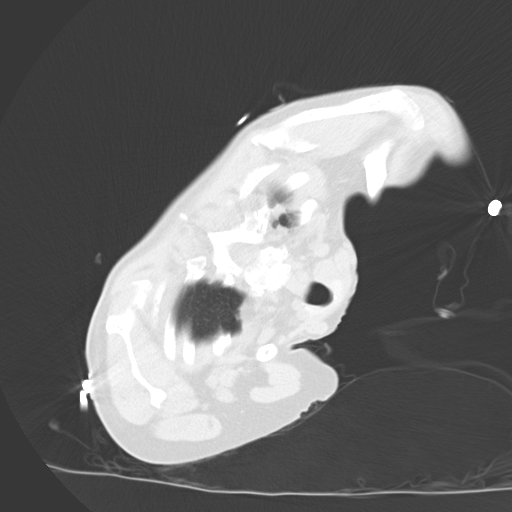
[im 72/78  soft-tissue]
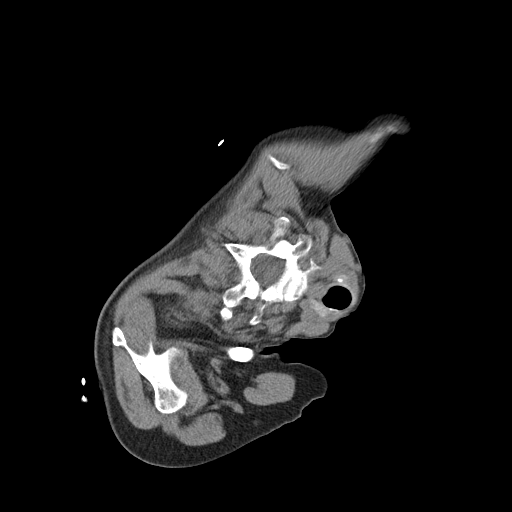
[im 72/78  lung]
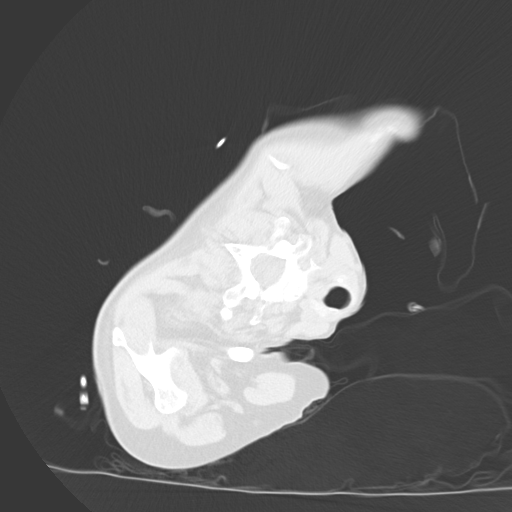

[13 of 32 positions shown; findings below may reference images not displayed]

EXAM:
CT-guided biopsy of lytic lesion of left seventh rib

MEDICATIONS:
None.

ANESTHESIA/SEDATION:
Moderate (conscious) sedation was employed during this procedure. A
total of Versed 3 mg and Fentanyl 100 mcg was administered
intravenously.

Moderate Sedation Time: 22 minutes. The patient's level of
consciousness and vital signs were monitored continuously by
radiology nursing throughout the procedure under my direct
supervision.

COMPLICATIONS:
None immediate.

PROCEDURE:
Informed written consent was obtained from the patient after a
thorough discussion of the procedural risks, benefits and
alternatives. All questions were addressed. Maximal Sterile Barrier
Technique was utilized including caps, mask, sterile gowns, sterile
gloves, sterile drape, hand hygiene and skin antiseptic. A timeout
was performed prior to the initiation of the procedure.

Patient positioned left lateral decubitus on the CT table. The left
posterior chest wall skin prepped and draped in usual fashion.
Following local administration, introducer needle was advanced into
the posterior aspect of the left seventh rib lesion and 4-18 gauge
cores were obtained. Patient tolerated procedure well without
complication.
IMPRESSION: CT-guided biopsy of left seventh rib lesion as above.

## 2021-06-25 MED ORDER — FENTANYL CITRATE (PF) 100 MCG/2ML IJ SOLN
INTRAMUSCULAR | Status: DC | PRN
  Administered 2021-06-25: 50 ug via INTRAVENOUS
  Administered 2021-06-25 (×2): 25 ug via INTRAVENOUS

## 2021-06-25 MED ORDER — LIDOCAINE HCL 1 % IJ SOLN
INTRAMUSCULAR | Status: AC
  Filled 2021-06-25: qty 10

## 2021-06-25 MED ORDER — MIDAZOLAM HCL 2 MG/2ML IJ SOLN
INTRAMUSCULAR | Status: AC
  Filled 2021-06-25: qty 6

## 2021-06-25 MED ORDER — MIDAZOLAM HCL 2 MG/2ML IJ SOLN
INTRAMUSCULAR | Status: DC | PRN
  Administered 2021-06-25: .5 mg via INTRAVENOUS
  Administered 2021-06-25: 1 mg via INTRAVENOUS
  Administered 2021-06-25: .5 mg via INTRAVENOUS
  Administered 2021-06-25: 1 mg via INTRAVENOUS

## 2021-06-25 MED ORDER — SODIUM CHLORIDE 0.9 % IV SOLN: INTRAVENOUS | Status: DC

## 2021-06-25 MED ORDER — FENTANYL CITRATE (PF) 100 MCG/2ML IJ SOLN
INTRAMUSCULAR | Status: AC
  Filled 2021-06-25: qty 6

## 2021-06-25 NOTE — H&P (Signed)
Chief Complaint: Patient was seen in consultation today for Left rib #7 lesion biopsy at the request of [place]  Referring Physician(s): Derek Jack  Supervising Physician: Mir, Sharen Heck  Patient Status: [facility] - Out-pt  History of Present Illness: Amanda Conner is a 70 y.o. female   Pt with chronic back pain Noted new pain -- persisted and did seek MD evaluation Outside CT found to be abnormal Rec: PET: 06/14/21 IMPRESSION: There are 2 lytic hypermetabolic bone lesions. This involves the left posterior seventh rib and the left aspect of the L5 vertebral body. No obvious primary tumor is identified. This still could represent metastatic disease. Myeloma would also be possible. Recommend biopsy  Scheduled today for L 5th rib lesion biopsy Dr Delton Coombes  No Ca hx  Past Medical History:  Diagnosis Date   Arthritis    osteoarthritis   Back pain    Bronchiectasis (HCC)    Cancer (HCC)    basal cell nose   Chronic cough    Complication of anesthesia    Dyspnea    Gastrointestinal symptoms    GERD (gastroesophageal reflux disease)    Glaucoma    History of hiatal hernia    Hyperlipemia    Hypertension    Osteopenia    Pneumonia    PONV (postoperative nausea and vomiting)    "only one time"   Spondylisthesis     Past Surgical History:  Procedure Laterality Date   ABDOMINAL EXPOSURE N/A 12/29/2018   Procedure: ABDOMINAL EXPOSURE;  Surgeon: Angelia Mould, MD;  Location: [facility] OR;  Service: Vascular;  Laterality: N/A;   ABDOMINOPLASTY  2002   ABDOMINOPLASTY  1970's   ANKLE SURGERY  10/2015   ANTERIOR LATERAL LUMBAR FUSION WITH PERCUTANEOUS SCREW 3 LEVEL Left 12/29/2018   Procedure: Lumbar Two to Lumbar Five Anterolateral lumbar interbody fusion;  Surgeon: Erline Levine, MD;  Location: [facility];  Service: Neurosurgery;  Laterality: Left;  anterolateral   ANTERIOR LUMBAR FUSION N/A 12/29/2018   Procedure: Lumbar Five Sacral One Anterior  lumbar interbody fusion;  Surgeon: Erline Levine, MD;  Location: [facility];  Service: Neurosurgery;  Laterality: N/A;  anterior approach   BASAL CELL CARCINOMA EXCISION  06/2018   nose   BLEPHAROPLASTY Bilateral 1999   BREAST ENHANCEMENT SURGERY  1970's   COLONOSCOPY     CYSTOURETHROSCOPY  2018   [place] J ureteal stents   DECOMPRESSION CORE HIP Left 2014   FACIAL COSMETIC SURGERY  2004   FLUOROSCOPY GUIDANCE     HIP ARTHROPLASTY Left    INCISIONAL HERNIA REPAIR N/A 10/08/2019   Procedure: OPEN INCISIONAL HERNIA REPAIR WITH MESH;  Surgeon: Armandina Gemma, MD;  Location: [facility];  Service: General;  Laterality: N/A;   JOINT REPLACEMENT Left    05/2014   LAPAROSCOPIC PARTIAL COLECTOMY  06/26/2017   LUMBAR PERCUTANEOUS PEDICLE SCREW 4 LEVEL N/A 12/29/2018   Procedure: Lumbar Percutaneous Pedicle Screw Placement Lumbar two-Sacral one;  Surgeon: Erline Levine, MD;  Location: [facility];  Service: Neurosurgery;  Laterality: N/A;   MOHS SURGERY  2019   nose   PARTIAL COLECTOMY  06/2017   for diverticulitis   REMOVAL OF BILATERAL TISSUE EXPANDERS WITH PLACEMENT OF BILATERAL BREAST IMPLANTS  2004   THIGH LIFT  1994   TOE FUSION Right    great toe   TONSILLECTOMY     UMBILICAL HERNIA REPAIR     with tummy tuck   UMBILICAL HERNIA REPAIR  2002    Allergies: Tobramycin-dexamethasone, Ethambutol, Neomycin-polymyxin-dexameth, Amikacin, Biaxin [  clarithromycin], Cefoxitin, Sulfamethoxazole-trimethoprim, Vancomycin, and Bacitracin-polymyxin b  Medications: Prior to Admission medications   Medication Sig Start Date End Date Taking? Authorizing Provider  albuterol (VENTOLIN HFA) 108 (90 Base) MCG/ACT inhaler Inhale 2 puffs into the lungs every 6 (six) hours as needed for wheezing or shortness of breath.   Yes [provider]  azithromycin (ZITHROMAX) 250 MG tablet Take 250 mg by mouth daily.   Yes [provider]  Bedaquiline Fumarate (SIRTURO) 100 MG TABS Take 200 mg by mouth every Monday,  Wednesday, and Friday. 07/03/20  Yes [provider]  benzonatate (TESSALON) 100 MG capsule Take by mouth 3 (three) times daily as needed for cough.   Yes [provider]  Biotin 1000 MCG tablet Take 1,000 mcg by mouth daily.   Yes [provider]  Calcium Carb-Cholecalciferol 600-800 MG-UNIT TABS Take 1 tablet by mouth daily.   Yes [provider]  dextromethorphan-guaiFENesin (MUCINEX DM) 30-600 MG 12hr tablet Take 1 tablet by mouth daily.    Yes [provider]  estrogen, conjugated,-medroxyprogesterone (PREMPRO) 0.45-1.5 MG tablet Take 1 tablet by mouth daily.   Yes [provider]  fluticasone (FLONASE) 50 MCG/ACT nasal spray Place 1 spray into both nostrils daily.   Yes [provider]  hydrochlorothiazide (HYDRODIURIL) 25 MG tablet Take 25 mg by mouth daily.   Yes [provider]  latanoprost (XALATAN) 0.005 % ophthalmic solution Place 1 drop into both eyes at bedtime.    Yes [provider]  linaclotide (LINZESS) 145 MCG CAPS capsule Take 145 mcg by mouth daily before breakfast.   Yes [provider]  lisinopril (ZESTRIL) 20 MG tablet Take 20 mg by mouth daily.   Yes [provider]  loratadine (CLARITIN) 10 MG tablet Take 10 mg by mouth daily.   Yes [provider]  methocarbamol (ROBAXIN) 500 MG tablet Take 1 tablet (500 mg total) by mouth every 6 (six) hours as needed for muscle spasms. 01/05/19  Yes Kristeen Miss, MD  omeprazole (PRILOSEC) 20 MG capsule Take 20 mg by mouth daily.   Yes [provider]  pantoprazole (PROTONIX) 40 MG tablet Take 40 mg by mouth daily.   Yes [provider]  ondansetron (ZOFRAN ODT) 4 MG disintegrating tablet Take 1 tablet (4 mg total) by mouth every 4 (four) hours as needed for nausea or vomiting. 01/19/19   Charlesetta Shanks, MD  SUMAtriptan (IMITREX) 100 MG tablet Take 100 mg by mouth every 2 (two) hours as needed for migraine. May  repeat in 2 hours if headache persists or recurs.    [provider]     Family History  Problem Relation Age of Onset   Hypertension Mother    COPD Mother    Hypertension Father    Heart disease Father     Social History   Socioeconomic History   Marital status: Married    Spouse name: Not on file   Number of children: Not on file   Years of education: Not on file   Highest education level: Not on file  Occupational History   Not on file  Tobacco Use   Smoking status: Never   Smokeless tobacco: Never  Vaping Use   Vaping Use: Never used  Substance and Sexual Activity   Alcohol use: Yes    Comment: occasional   Drug use: Never   Sexual activity: Not on file  Other Topics Concern   Not on file  Social History Narrative   Not on  file   Social Determinants of Health   Financial Resource Strain: Not on file  Food Insecurity: Not on file  Transportation Needs: Not on file  Physical Activity: Not on file  Stress: Not on file  Social Connections: Not on file    Review of Systems: A 12 point ROS discussed and pertinent positives are indicated in the HPI above.  All other systems are negative.  Review of Systems  Constitutional:  Negative for activity change, fatigue and fever.  Respiratory:  Negative for cough and shortness of breath.   Cardiovascular:  Negative for chest pain.  Musculoskeletal:  Positive for back pain.  Psychiatric/Behavioral:  Negative for behavioral problems and confusion.    Vital Signs: BP (!) 141/70   Pulse 83   Temp 98.3 F (36.8 C) (Oral)   Resp 15   Ht 5\' 5"  (1.651 m)   Wt 135 lb (61.2 kg)   SpO2 99%   BMI 22.47 kg/m   Physical Exam Vitals reviewed.  HENT:     Mouth/Throat:     Mouth: Mucous membranes are moist.  Cardiovascular:     Rate and Rhythm: Normal rate and regular rhythm.     Heart sounds: Normal heart sounds.  Pulmonary:     Effort: Pulmonary effort is normal.     Breath sounds: Normal breath sounds.   Abdominal:     Palpations: Abdomen is soft.  Musculoskeletal:        General: Normal range of motion.  Skin:    General: Skin is warm.  Neurological:     Mental Status: She is alert and oriented to person, place, and time.  Psychiatric:        Behavior: Behavior normal.    Imaging: NM PET Image Initial (PI) Skull Base To Thigh (F-18 FDG)  Result Date: 06/15/2021 CLINICAL DATA:  Initial treatment strategy for left seventh rib lesion. History of bladder cancer. EXAM: NUCLEAR MEDICINE PET SKULL BASE TO THIGH TECHNIQUE: 5.7 mCi F-18 FDG was injected intravenously. Full-ring PET imaging was performed from the skull base to thigh after the radiotracer. CT data was obtained and used for attenuation correction and anatomic localization. Fasting blood glucose: 146 mg/dl COMPARISON:  Outside CT scan 05/24/2021 FINDINGS: Mediastinal blood pool activity: SUV max 1.65 Liver activity: SUV max NA NECK: No hypermetabolic lymph nodes in the neck. Incidental CT findings: none CHEST: Moderate brown fat activity noted in the supraclavicular regions, mediastinum and intercostal areas. No chest wall mass, supraclavicular or axillary adenopathy. No mediastinal or hilar mass or adenopathy. No worrisome hypermetabolic pulmonary lesions to suggest primary lung neoplasm. Incidental CT findings: Emphysematous changes and pulmonary scarring with some areas of bronchiectasis. Stable branching right middle lobe process likely mucoid impaction. ABDOMEN/PELVIS: No abnormal hypermetabolic activity within the liver, pancreas, adrenal glands, or spleen. No hypermetabolic lymph nodes in the abdomen or pelvis. Incidental CT findings: Scattered vascular calcifications. No aneurysm. SKELETON: Surgical changes related to lumbar fusion. There is mild hypermetabolism involving the left aspect of the L5 vertebral body and also the pedicle region surrounding the screw. SUV max is 4.2. There appears to be a lytic process on the CT scan. The  lytic left posterior seventh rib lesion is mildly hypermetabolic with SUV max of 1.47. I do not see any other definite lytic hypermetabolic bone lesions. Findings could be due to metastatic disease or possible myeloma. Recommend biopsy. Incidental CT findings: none IMPRESSION: There are 2 lytic hypermetabolic bone lesions. This involves the left posterior seventh rib and the  left aspect of the L5 vertebral body. No obvious primary tumor is identified. This still could represent metastatic disease. Myeloma would also be possible. Recommend biopsy. Electronically Signed   By: Marijo Sanes M.D.   On: 06/15/2021 17:24    Labs:  CBC: Recent Labs    06/13/21 0902  WBC 4.2  HGB 12.2  HCT 37.3  PLT 340    COAGS: No results for input(s): INR, APTT in the last 8760 hours.  BMP: Recent Labs    06/13/21 0902  NA 133*  K 4.5  CL 96*  CO2 28  GLUCOSE 94  BUN 19  CALCIUM 9.6  CREATININE 0.72  GFRNONAA >60    LIVER FUNCTION TESTS: Recent Labs    06/13/21 0902  BILITOT 0.3  AST 26  ALT 23  ALKPHOS 75  PROT 8.8*  ALBUMIN 4.2    TUMOR MARKERS: No results for input(s): AFPTM, CEA, CA199, CHROMGRNA in the last 8760 hours.  Assessment and Plan:  Left back pain Abnormal CT and +PET Scheduled for Left 7th rib lesion biopsy Risks and benefits of left 7th rib lesion biopsy was discussed with the patient and/or patient's family including, but not limited to bleeding, infection, damage to adjacent structures; pneumothorax - with possible chest tube placement or low yield requiring additional tests.  All of the questions were answered and there is agreement to proceed. Consent signed and in chart.   Thank you for this interesting consult.  I greatly enjoyed meeting Amanda Conner and look forward to participating in their care.  A copy of this report was sent to the requesting provider on this date.  Electronically Signed: Lavonia Drafts, PA-C 06/25/2021, 10:46 AM   I spent a  total of  30 Minutes   in face to face in clinical consultation, greater than 50% of which was counseling/coordinating care for left 7th rib lesion bx

## 2021-06-25 NOTE — Procedures (Signed)
Interventional Radiology Procedure Note  Procedure: CT guided rib lesion biopsy  Indication: Bladder Ca.  Right 7th rib lytic lesion.  Findings: Please refer to procedural dictation for full description.  Complications: None  EBL: < 10 mL  Miachel Roux, MD [phone]

## 2021-06-25 NOTE — Progress Notes (Signed)
Dr Mir notified of CXR results and ok to d/c client home

## 2021-06-26 ENCOUNTER — Other Ambulatory Visit: Payer: Self-pay

## 2021-06-26 ENCOUNTER — Emergency Department (HOSPITAL_COMMUNITY)
Admission: EM | Admit: 2021-06-26 | Discharge: 2021-06-27 | Disposition: A | Discharge status: Home / Self Care | Payer: Medicare Other | Attending: Emergency Medicine | Admitting: Emergency Medicine

## 2021-06-26 ENCOUNTER — Encounter (HOSPITAL_COMMUNITY): Payer: Self-pay | Admitting: *Deleted

## 2021-06-26 DIAGNOSIS — Z203 Contact with and (suspected) exposure to rabies: Secondary | ICD-10-CM | POA: Insufficient documentation

## 2021-06-26 DIAGNOSIS — Z79899 Other long term (current) drug therapy: Secondary | ICD-10-CM | POA: Insufficient documentation

## 2021-06-26 DIAGNOSIS — Z96642 Presence of left artificial hip joint: Secondary | ICD-10-CM | POA: Diagnosis not present

## 2021-06-26 DIAGNOSIS — Z23 Encounter for immunization: Secondary | ICD-10-CM | POA: Insufficient documentation

## 2021-06-26 DIAGNOSIS — Z2914 Encounter for prophylactic rabies immune globin: Secondary | ICD-10-CM | POA: Insufficient documentation

## 2021-06-26 DIAGNOSIS — S81852A Open bite, left lower leg, initial encounter: Secondary | ICD-10-CM | POA: Insufficient documentation

## 2021-06-26 DIAGNOSIS — I10 Essential (primary) hypertension: Secondary | ICD-10-CM | POA: Insufficient documentation

## 2021-06-26 DIAGNOSIS — W5501XA Bitten by cat, initial encounter: Secondary | ICD-10-CM | POA: Diagnosis not present

## 2021-06-26 DIAGNOSIS — Z85828 Personal history of other malignant neoplasm of skin: Secondary | ICD-10-CM | POA: Diagnosis not present

## 2021-06-26 MED ORDER — RABIES VACCINE, PCEC IM SUSR
1.0000 mL | Freq: Once | INTRAMUSCULAR | Status: AC
  Administered 2021-06-26: 1 mL via INTRAMUSCULAR
  Filled 2021-06-26: qty 1

## 2021-06-26 MED ORDER — RABIES IMMUNE GLOBULIN 150 UNIT/ML IM INJ
20.0000 [IU]/kg | INJECTION | Freq: Once | INTRAMUSCULAR | Status: AC
  Administered 2021-06-26: 1200 [IU] via INTRAMUSCULAR
  Filled 2021-06-26 (×2): qty 8

## 2021-06-26 NOTE — ED Triage Notes (Signed)
Pt states she was bit by a stray cat on the back of left leg today

## 2021-06-27 LAB — SURGICAL PATHOLOGY

## 2021-06-27 NOTE — ED Provider Notes (Signed)
[facility] EMERGENCY DEPARTMENT Provider Note   CSN: [id number] Arrival date & time: 06/26/21  2229     History Chief Complaint  Patient presents with   Animal Bite    Back of left leg    Amanda Conner is a 70 y.o. female.  HPI     This is a 10 75-year-old female with a history of bronchiectasis, hypertension, hyperlipidemia who presents with a cat bite.  Patient reports that a neighborhood cat bit her unprovoked.  She states she does not know who the cat belongs to but has seen in the neighborhood before and it appears well-groomed.  She was unable to get information regarding cat or its rabies/vaccination status.  She did have a outpatient visit with her pulmonologist earlier today.  He looked at the wound, cleaned it, and prescribed her Augmentin.  She also had her tetanus updated.  However she was referred to the ED for rabies evaluation.  Denies significant pain.  Past Medical History:  Diagnosis Date   Arthritis    osteoarthritis   Back pain    Bronchiectasis (HCC)    Cancer (HCC)    basal cell nose   Chronic cough    Complication of anesthesia    Dyspnea    Gastrointestinal symptoms    GERD (gastroesophageal reflux disease)    Glaucoma    History of hiatal hernia    Hyperlipemia    Hypertension    Osteopenia    Pneumonia    PONV (postoperative nausea and vomiting)    "only one time"   Spondylisthesis     Patient Active Problem List   Diagnosis Date Noted   Rib lesion 06/13/2021   Ventral incisional hernia 10/08/2019   Incisional hernia, without obstruction or gangrene 10/05/2019   Lumbar scoliosis 12/29/2018    Past Surgical History:  Procedure Laterality Date   ABDOMINAL EXPOSURE N/A 12/29/2018   Procedure: ABDOMINAL EXPOSURE;  Surgeon: Angelia Mould, MD;  Location: [place];  Service: Vascular;  Laterality: N/A;   ABDOMINOPLASTY  2002   ABDOMINOPLASTY  1970's   ANKLE SURGERY  10/2015   ANTERIOR LATERAL LUMBAR FUSION WITH PERCUTANEOUS  SCREW 3 LEVEL Left 12/29/2018   Procedure: Lumbar Two to Lumbar Five Anterolateral lumbar interbody fusion;  Surgeon: Erline Levine, MD;  Location: [place];  Service: Neurosurgery;  Laterality: Left;  anterolateral   ANTERIOR LUMBAR FUSION N/A 12/29/2018   Procedure: Lumbar Five Sacral One Anterior lumbar interbody fusion;  Surgeon: Erline Levine, MD;  Location: [place];  Service: Neurosurgery;  Laterality: N/A;  anterior approach   BASAL CELL CARCINOMA EXCISION  06/2018   nose   BLEPHAROPLASTY Bilateral 1999   BREAST ENHANCEMENT SURGERY  1970's   COLONOSCOPY     CYSTOURETHROSCOPY  2018   [place] J ureteal stents   DECOMPRESSION CORE HIP Left 2014   FACIAL COSMETIC SURGERY  2004   FLUOROSCOPY GUIDANCE     HIP ARTHROPLASTY Left    INCISIONAL HERNIA REPAIR N/A 10/08/2019   Procedure: OPEN INCISIONAL HERNIA REPAIR WITH MESH;  Surgeon: Armandina Gemma, MD;  Location: [facility];  Service: General;  Laterality: N/A;   JOINT REPLACEMENT Left    05/2014   LAPAROSCOPIC PARTIAL COLECTOMY  06/26/2017   LUMBAR PERCUTANEOUS PEDICLE SCREW 4 LEVEL N/A 12/29/2018   Procedure: Lumbar Percutaneous Pedicle Screw Placement Lumbar two-Sacral one;  Surgeon: Erline Levine, MD;  Location: [place];  Service: Neurosurgery;  Laterality: N/A;   MOHS SURGERY  2019   nose  PARTIAL COLECTOMY  06/2017   for diverticulitis   REMOVAL OF BILATERAL TISSUE EXPANDERS WITH PLACEMENT OF BILATERAL BREAST IMPLANTS  2004   THIGH LIFT  1994   TOE FUSION Right    great toe   TONSILLECTOMY     UMBILICAL HERNIA REPAIR     with tummy tuck   UMBILICAL HERNIA REPAIR  2002     OB History   No obstetric history on file.     Family History  Problem Relation Age of Onset   Hypertension Mother    COPD Mother    Hypertension Father    Heart disease Father     Social History   Tobacco Use   Smoking status: Never   Smokeless tobacco: Never  Vaping Use   Vaping Use: Never used  Substance Use Topics   Alcohol use: Yes    Comment:  occasional   Drug use: Never    Home Medications Prior to Admission medications   Medication Sig Start Date End Date Taking? Authorizing Provider  albuterol (VENTOLIN HFA) 108 (90 Base) MCG/ACT inhaler Inhale 2 puffs into the lungs every 6 (six) hours as needed for wheezing or shortness of breath.    [provider]  azithromycin (ZITHROMAX) 250 MG tablet Take 250 mg by mouth daily.    [provider]  Bedaquiline Fumarate (SIRTURO) 100 MG TABS Take 200 mg by mouth every Monday, Wednesday, and Friday. 07/03/20   [provider]  benzonatate (TESSALON) 100 MG capsule Take by mouth 3 (three) times daily as needed for cough.    [provider]  Biotin 1000 MCG tablet Take 1,000 mcg by mouth daily.    [provider]  Calcium Carb-Cholecalciferol 600-800 MG-UNIT TABS Take 1 tablet by mouth daily.    [provider]  dextromethorphan-guaiFENesin (MUCINEX DM) 30-600 MG 12hr tablet Take 1 tablet by mouth daily.     [provider]  estrogen, conjugated,-medroxyprogesterone (PREMPRO) 0.45-1.5 MG tablet Take 1 tablet by mouth daily.    [provider]  fluticasone (FLONASE) 50 MCG/ACT nasal spray Place 1 spray into both nostrils daily.    [provider]  hydrochlorothiazide (HYDRODIURIL) 25 MG tablet Take 25 mg by mouth daily.    [provider]  latanoprost (XALATAN) 0.005 % ophthalmic solution Place 1 drop into both eyes at bedtime.     [provider]  linaclotide (LINZESS) 145 MCG CAPS capsule Take 145 mcg by mouth daily before breakfast.    [provider]  lisinopril (ZESTRIL) 20 MG tablet Take 20 mg by mouth daily.    [provider]  loratadine (CLARITIN) 10 MG tablet Take 10 mg by mouth daily.    [provider]  methocarbamol (ROBAXIN) 500 MG tablet Take 1 tablet (500 mg total) by mouth every 6 (six) hours as needed for muscle spasms. 01/05/19   Kristeen Miss, MD   omeprazole (PRILOSEC) 20 MG capsule Take 20 mg by mouth daily.    [provider]  ondansetron (ZOFRAN ODT) 4 MG disintegrating tablet Take 1 tablet (4 mg total) by mouth every 4 (four) hours as needed for nausea or vomiting. 01/19/19   Charlesetta Shanks, MD  pantoprazole (PROTONIX) 40 MG tablet Take 40 mg by mouth daily.    [provider]  SUMAtriptan (IMITREX) 100 MG tablet Take 100 mg by mouth every 2 (two) hours as needed for migraine. May repeat in 2 hours if headache persists or recurs.    [provider]  Allergies    Tobramycin-dexamethasone, Ethambutol, Neomycin-polymyxin-dexameth, Amikacin, Biaxin [clarithromycin], Cefoxitin, Sulfamethoxazole-trimethoprim, Vancomycin, and Bacitracin-polymyxin b  Review of Systems   Review of Systems  Constitutional:  Negative for fever.  Skin:  Positive for wound. Negative for color change.  All other systems reviewed and are negative.  Physical Exam Updated Vital Signs BP (!) 155/85 (BP Location: Right Arm)    Pulse 90    Temp (!) 97.3 F (36.3 C) (Oral)    Resp 17    Ht 1.651 m (5\' 5" )    Wt 61.2 kg    SpO2 96%    BMI 22.47 kg/m   Physical Exam Vitals and nursing note reviewed.  Constitutional:      Appearance: She is well-developed. She is not ill-appearing.  HENT:     Head: Normocephalic and atraumatic.     Mouth/Throat:     Mouth: Mucous membranes are moist.  Eyes:     Conjunctiva/sclera: Conjunctivae normal.  Cardiovascular:     Rate and Rhythm: Normal rate and regular rhythm.  Pulmonary:     Effort: Pulmonary effort is normal. No respiratory distress.  Abdominal:     Palpations: Abdomen is soft.  Musculoskeletal:        General: No deformity.     Cervical back: Neck supple.  Skin:    General: Skin is warm and dry.     Comments: Two her wounds noted left posterior calf, no drainage or significant erythema noted  Neurological:     Mental Status: She is alert and oriented to person, place, and  time.  Psychiatric:        Mood and Affect: Mood normal.    ED Results / Procedures / Treatments   Labs (all labs ordered are listed, but only abnormal results are displayed) Labs Reviewed - No data to display  EKG None  Radiology DG Chest [facility] 1 View  Result Date: 06/25/2021 CLINICAL DATA:  Status post left seventh rib biopsy EXAM: PORTABLE CHEST 1 VIEW COMPARISON:  06/14/2021 PET-CT FINDINGS: Top-normal heart size. Mildly tortuous atherosclerotic thoracic aorta. Otherwise normal mediastinal contour. No pneumothorax. No pleural effusions. Stable mild blunting of the left costophrenic angle compatible with chronic mild pleural-parenchymal scarring as seen on prior CT images. No pulmonary edema. No acute consolidative airspace disease. Stable lytic mildly expansile posterior left seventh rib lesion. Partially visualized bilateral posterior spinal fusion hardware in the lumbar spine. IMPRESSION: 1. No pneumothorax. No active cardiopulmonary disease. 2. Stable lytic mildly expansile posterior left seventh rib lesion. Electronically Signed   By: Ilona Sorrel M.D.   On: 06/25/2021 14:25   CT BONE TROCAR/NEEDLE BIOPSY DEEP  Result Date: 06/25/2021 INDICATION: 70 year old woman with history of bladder cancer presents to IR for CT guided biopsy of left seventh rib lytic lesion. EXAM: CT-guided biopsy of lytic lesion of left seventh rib MEDICATIONS: None. ANESTHESIA/SEDATION: Moderate (conscious) sedation was employed during this procedure. A total of Versed 3 mg and Fentanyl 100 mcg was administered intravenously. Moderate Sedation Time: 22 minutes. The patient's level of consciousness and vital signs were monitored continuously by radiology nursing throughout the procedure under my direct supervision. COMPLICATIONS: None immediate. PROCEDURE: Informed written consent was obtained from the patient after a thorough discussion of the procedural risks, benefits and alternatives. All questions were  addressed. Maximal Sterile Barrier Technique was utilized including caps, mask, sterile gowns, sterile gloves, sterile drape, hand hygiene and skin antiseptic. A timeout was performed prior to the initiation of the procedure. Patient positioned left lateral decubitus  on the CT table. The left posterior chest wall skin prepped and draped in usual fashion. Following local administration, introducer needle was advanced into the posterior aspect of the left seventh rib lesion and 4-18 gauge cores were obtained. Patient tolerated procedure well without complication. IMPRESSION: CT-guided biopsy of left seventh rib lesion as above. Electronically Signed   By: Miachel Roux M.D.   On: 06/25/2021 13:56    Procedures Procedures   Medications Ordered in ED Medications  rabies immune globulin (HYPERAB/KEDRAB) injection 1,200 Units (1,200 Units Intramuscular Given 06/26/21 2355)  rabies vaccine (RABAVERT) injection 1 mL (1 mL Intramuscular Given 06/26/21 2355)    ED Course  I have reviewed the triage vital signs and the nursing notes.  Pertinent labs & imaging results that were available during my care of the patient were reviewed by me and considered in my medical decision making (see chart for details).    MDM Rules/Calculators/A&P                           Patient presents with a cat bite.  She appropriately was prescribed Augmentin and updated with her tetanus earlier today.  She cannot confirm rabies vaccination status of the cat.  We discussed options.  We will provide a rabies vaccination and immunoglobulin today.  If she is able to take possession of the cat and have it observed, she may avoid further vaccinations.  However, she was advised that otherwise she would need subsequent immunization.  Patient stated understanding.  Encouraged her to continue Augmentin.  No obvious active infection at this time.  After history, exam, and medical workup I feel the patient has been appropriately medically  screened and is safe for discharge home. Pertinent diagnoses were discussed with the patient. Patient was given return precautions.  Final Clinical Impression(s) / ED Diagnoses Final diagnoses:  Cat bite, initial encounter    Rx / DC Orders ED Discharge Orders     None        Taggart Prasad, Barbette Hair, MD 06/27/21 [phone]

## 2021-06-27 NOTE — Discharge Instructions (Signed)
You were seen today for a cat bite.  Continue your Augmentin at home.  Monitor for signs and symptoms of infection including redness or drainage from the bite site.  You were given rabies vaccination and immunoglobulin.  If you are able to have the animal observed, you may avoid further immunization.  However, if you are unable to have this done, you need to have 3 additional rabies immunizations.

## 2021-06-29 ENCOUNTER — Other Ambulatory Visit: Payer: Self-pay

## 2021-06-29 ENCOUNTER — Ambulatory Visit
Admission: RE | Admit: 2021-06-29 | Discharge: 2021-06-29 | Disposition: A | Discharge status: Home / Self Care | Payer: Medicare Other | Source: Ambulatory Visit | Attending: Family Medicine | Admitting: Family Medicine

## 2021-06-29 DIAGNOSIS — Z203 Contact with and (suspected) exposure to rabies: Secondary | ICD-10-CM

## 2021-06-29 MED ORDER — RABIES VACCINE, PCEC IM SUSR
1.0000 mL | Freq: Once | INTRAMUSCULAR | Status: AC
  Administered 2021-06-29: 1 mL via INTRAMUSCULAR

## 2021-06-29 NOTE — ED Triage Notes (Signed)
Here for 2nd rabies vaccine 

## 2021-07-01 NOTE — Progress Notes (Signed)
Amanda Conner, Amanda Conner   CLINIC:  Medical Oncology/Hematology  PCP:  Yvone Neu, MD [facility] [place] /* [phone]   REASON FOR VISIT:  Follow-up for left rib lesion  PRIOR THERAPY: none  NGS Results: not done  CURRENT THERAPY: under work-up  BRIEF ONCOLOGIC HISTORY:  Oncology History   No history exists.    CANCER STAGING:  Cancer Staging  No matching staging information was found for the patient.  INTERVAL HISTORY:  Amanda Conner, a 70 y.o. female, returns for routine follow-up of her left rib lesion. Amanda Conner was last seen on 06/13/2021.   Today she reports feeling well. She reports severe lower back pain starting yesterday; she reports muscle spasms in her back when she tries to move which is preventing her from standing. She denies chills and dysuria.   REVIEW OF SYSTEMS:  Review of Systems  Constitutional:  Positive for fatigue (25%). Negative for appetite change (75%) and chills.  Respiratory:  Positive for cough and shortness of breath.   Gastrointestinal:  Positive for constipation.  Genitourinary:  Negative for dysuria.   Musculoskeletal:  Positive for back pain (8/10 lower).  Psychiatric/Behavioral:  Positive for sleep disturbance.   All other systems reviewed and are negative.  PAST MEDICAL/SURGICAL HISTORY:  Past Medical History:  Diagnosis Date   Arthritis    osteoarthritis   Back pain    Bronchiectasis (HCC)    Cancer (HCC)    basal cell nose   Chronic cough    Complication of anesthesia    Dyspnea    Gastrointestinal symptoms    GERD (gastroesophageal reflux disease)    Glaucoma    History of hiatal hernia    Hyperlipemia    Hypertension    Osteopenia    Pneumonia    PONV (postoperative nausea and vomiting)    "only one time"   Spondylisthesis    Past Surgical History:  Procedure Laterality Date   ABDOMINAL EXPOSURE N/A  12/29/2018   Procedure: ABDOMINAL EXPOSURE;  Surgeon: Angelia Mould, MD;  Location: [facility];  Service: Vascular;  Laterality: N/A;   ABDOMINOPLASTY  2002   ABDOMINOPLASTY  1970's   ANKLE SURGERY  10/2015   ANTERIOR LATERAL LUMBAR FUSION WITH PERCUTANEOUS SCREW 3 LEVEL Left 12/29/2018   Procedure: Lumbar Two to Lumbar Five Anterolateral lumbar interbody fusion;  Surgeon: Erline Levine, MD;  Location: [place];  Service: Neurosurgery;  Laterality: Left;  anterolateral   ANTERIOR LUMBAR FUSION N/A 12/29/2018   Procedure: Lumbar Five Sacral One Anterior lumbar interbody fusion;  Surgeon: Erline Levine, MD;  Location: [place];  Service: Neurosurgery;  Laterality: N/A;  anterior approach   BASAL CELL CARCINOMA EXCISION  06/2018   nose   BLEPHAROPLASTY Bilateral 1999   BREAST ENHANCEMENT SURGERY  1970's   COLONOSCOPY     CYSTOURETHROSCOPY  2018   Doble J ureteal stents   DECOMPRESSION CORE HIP Left 2014   FACIAL COSMETIC SURGERY  2004   FLUOROSCOPY GUIDANCE     HIP ARTHROPLASTY Left    INCISIONAL HERNIA REPAIR N/A 10/08/2019   Procedure: OPEN INCISIONAL HERNIA REPAIR WITH MESH;  Surgeon: Armandina Gemma, MD;  Location: [facility] ORS;  Service: General;  Laterality: N/A;   JOINT REPLACEMENT Left    05/2014   LAPAROSCOPIC PARTIAL COLECTOMY  06/26/2017   LUMBAR PERCUTANEOUS PEDICLE SCREW 4 LEVEL N/A 12/29/2018   Procedure: Lumbar Percutaneous Pedicle Screw Placement Lumbar two-Sacral  one;  Surgeon: Erline Levine, MD;  Location: [place];  Service: Neurosurgery;  Laterality: N/A;   MOHS SURGERY  2019   nose   PARTIAL COLECTOMY  06/2017   for diverticulitis   REMOVAL OF BILATERAL TISSUE EXPANDERS WITH PLACEMENT OF BILATERAL BREAST IMPLANTS  2004   THIGH LIFT  1994   TOE FUSION Right    great toe   TONSILLECTOMY     UMBILICAL HERNIA REPAIR     with tummy tuck   UMBILICAL HERNIA REPAIR  2002    SOCIAL HISTORY:  Social History   Socioeconomic History   Marital status: Married    Spouse name: Not on  file   Number of children: Not on file   Years of education: Not on file   Highest education level: Not on file  Occupational History   Not on file  Tobacco Use   Smoking status: Never   Smokeless tobacco: Never  Vaping Use   Vaping Use: Never used  Substance and Sexual Activity   Alcohol use: Yes    Comment: occasional   Drug use: Never   Sexual activity: Not on file  Other Topics Concern   Not on file  Social History Narrative   Not on file   Social Determinants of Health   Financial Resource Strain: Not on file  Food Insecurity: Not on file  Transportation Needs: Not on file  Physical Activity: Not on file  Stress: Not on file  Social Connections: Not on file  Intimate Partner Violence: Not on file    FAMILY HISTORY:  Family History  Problem Relation Age of Onset   Hypertension Mother    COPD Mother    Hypertension Father    Heart disease Father     CURRENT MEDICATIONS:  Current Outpatient Medications  Medication Sig Dispense Refill   albuterol (VENTOLIN HFA) 108 (90 Base) MCG/ACT inhaler Inhale 2 puffs into the lungs every 6 (six) hours as needed for wheezing or shortness of breath.     azithromycin (ZITHROMAX) 250 MG tablet Take 250 mg by mouth daily.     Bedaquiline Fumarate (SIRTURO) 100 MG TABS Take 200 mg by mouth every Monday, Wednesday, and Friday.     benzonatate (TESSALON) 100 MG capsule Take by mouth 3 (three) times daily as needed for cough.     Biotin 1000 MCG tablet Take 1,000 mcg by mouth daily.     Calcium Carb-Cholecalciferol 600-800 MG-UNIT TABS Take 1 tablet by mouth daily.     dextromethorphan-guaiFENesin (MUCINEX DM) 30-600 MG 12hr tablet Take 1 tablet by mouth daily.      estrogen, conjugated,-medroxyprogesterone (PREMPRO) 0.45-1.5 MG tablet Take 1 tablet by mouth daily.     fluticasone (FLONASE) 50 MCG/ACT nasal spray Place 1 spray into both nostrils daily.     hydrochlorothiazide (HYDRODIURIL) 25 MG tablet Take 25 mg by mouth daily.      latanoprost (XALATAN) 0.005 % ophthalmic solution Place 1 drop into both eyes at bedtime.      linaclotide (LINZESS) 145 MCG CAPS capsule Take 145 mcg by mouth daily before breakfast.     lisinopril (ZESTRIL) 20 MG tablet Take 20 mg by mouth daily.     loratadine (CLARITIN) 10 MG tablet Take 10 mg by mouth daily.     methocarbamol (ROBAXIN) 500 MG tablet Take 1 tablet (500 mg total) by mouth every 6 (six) hours as needed for muscle spasms. 40 tablet 3   omeprazole (PRILOSEC) 20 MG capsule Take 20 mg by  mouth daily.     ondansetron (ZOFRAN ODT) 4 MG disintegrating tablet Take 1 tablet (4 mg total) by mouth every 4 (four) hours as needed for nausea or vomiting. 20 tablet 0   pantoprazole (PROTONIX) 40 MG tablet Take 40 mg by mouth daily.     SUMAtriptan (IMITREX) 100 MG tablet Take 100 mg by mouth every 2 (two) hours as needed for migraine. May repeat in 2 hours if headache persists or recurs.     No current facility-administered medications for this visit.    ALLERGIES:  Allergies  Allergen Reactions   Tobramycin-Dexamethasone Other (See Comments)    Itching and swelling   Ethambutol Other (See Comments)    Fever   Neomycin-Polymyxin-Dexameth Other (See Comments)    Itching and swelling   Amikacin Other (See Comments)    Eosinophilia with chills and aching when given with cefoxitin   Biaxin [Clarithromycin] Itching   Cefoxitin Other (See Comments)    Eosinophlia when given with amikacin   Sulfamethoxazole-Trimethoprim Itching   Vancomycin    Bacitracin-Polymyxin B Rash    PHYSICAL EXAM:  Performance status (ECOG): 1 - Symptomatic but completely ambulatory  Vitals:   07/02/21 0952  BP: (!) 147/82  Pulse: 79  Resp: 18  Temp: 98.1 F (36.7 C)  SpO2: 100%   Wt Readings from Last 3 Encounters:  06/26/21 135 lb (61.2 kg)  06/25/21 135 lb (61.2 kg)  06/13/21 136 lb 6.4 oz (61.9 kg)   Physical Exam Vitals reviewed.  Constitutional:      Appearance: Normal appearance.      Comments: In wheelchair  Cardiovascular:     Rate and Rhythm: Normal rate and regular rhythm.     Pulses: Normal pulses.     Heart sounds: Normal heart sounds.  Pulmonary:     Effort: Pulmonary effort is normal.     Breath sounds: Normal breath sounds.  Neurological:     General: No focal deficit present.     Mental Status: She is alert and oriented to person, place, and time.  Psychiatric:        Mood and Affect: Mood normal.        Behavior: Behavior normal.     LABORATORY DATA:  I have reviewed the labs as listed.  CBC Latest Ref Rng & Units 06/25/2021 06/13/2021 10/01/2019  WBC 4.0 - 10.5 K/uL 4.3 4.2 5.2  Hemoglobin 12.0 - 15.0 g/dL 13.1 12.2 12.8  Hematocrit 36.0 - 46.0 % 39.6 37.3 40.3  Platelets 150 - 400 K/uL 433(H) 340 374   CMP Latest Ref Rng & Units 06/13/2021 10/01/2019 01/19/2019  Glucose 70 - 99 mg/dL 94 81 130(H)  BUN 8 - 23 mg/dL 19 15 7(L)  Creatinine 0.44 - 1.00 mg/dL 0.72 0.64 0.74  Sodium 135 - 145 mmol/L 133(L) 137 128(L)  Potassium 3.5 - 5.1 mmol/L 4.5 4.5 4.1  Chloride 98 - 111 mmol/L 96(L) 99 91(L)  CO2 22 - 32 mmol/L '28 30 23  ' Calcium 8.9 - 10.3 mg/dL 9.6 9.4 9.2  Total Protein 6.5 - 8.1 g/dL 8.8(H) - 7.7  Total Bilirubin 0.3 - 1.2 mg/dL 0.3 - 0.7  Alkaline Phos 38 - 126 U/L 75 - 85  AST 15 - 41 U/L 26 - 19  ALT 0 - 44 U/L 23 - 14    DIAGNOSTIC IMAGING:  I have independently reviewed the scans and discussed with the patient. NM PET Image Initial (PI) Skull Base To Thigh (F-18 FDG)  Result Date: 06/15/2021 CLINICAL  DATA:  Initial treatment strategy for left seventh rib lesion. History of bladder cancer. EXAM: NUCLEAR MEDICINE PET SKULL BASE TO THIGH TECHNIQUE: 5.7 mCi F-18 FDG was injected intravenously. Full-ring PET imaging was performed from the skull base to thigh after the radiotracer. CT data was obtained and used for attenuation correction and anatomic localization. Fasting blood glucose: 146 mg/dl COMPARISON:  Outside CT scan 05/24/2021  FINDINGS: Mediastinal blood pool activity: SUV max 1.65 Liver activity: SUV max NA NECK: No hypermetabolic lymph nodes in the neck. Incidental CT findings: none CHEST: Moderate brown fat activity noted in the supraclavicular regions, mediastinum and intercostal areas. No chest wall mass, supraclavicular or axillary adenopathy. No mediastinal or hilar mass or adenopathy. No worrisome hypermetabolic pulmonary lesions to suggest primary lung neoplasm. Incidental CT findings: Emphysematous changes and pulmonary scarring with some areas of bronchiectasis. Stable branching right middle lobe process likely mucoid impaction. ABDOMEN/PELVIS: No abnormal hypermetabolic activity within the liver, pancreas, adrenal glands, or spleen. No hypermetabolic lymph nodes in the abdomen or pelvis. Incidental CT findings: Scattered vascular calcifications. No aneurysm. SKELETON: Surgical changes related to lumbar fusion. There is mild hypermetabolism involving the left aspect of the L5 vertebral body and also the pedicle region surrounding the screw. SUV max is 4.2. There appears to be a lytic process on the CT scan. The lytic left posterior seventh rib lesion is mildly hypermetabolic with SUV max of 1.61. I do not see any other definite lytic hypermetabolic bone lesions. Findings could be due to metastatic disease or possible myeloma. Recommend biopsy. Incidental CT findings: none IMPRESSION: There are 2 lytic hypermetabolic bone lesions. This involves the left posterior seventh rib and the left aspect of the L5 vertebral body. No obvious primary tumor is identified. This still could represent metastatic disease. Myeloma would also be possible. Recommend biopsy. Electronically Signed   By: Marijo Sanes M.D.   On: 06/15/2021 17:24   DG Chest Port 1 View  Result Date: 06/25/2021 CLINICAL DATA:  Status post left seventh rib biopsy EXAM: PORTABLE CHEST 1 VIEW COMPARISON:  06/14/2021 PET-CT FINDINGS: Top-normal heart size. Mildly  tortuous atherosclerotic thoracic aorta. Otherwise normal mediastinal contour. No pneumothorax. No pleural effusions. Stable mild blunting of the left costophrenic angle compatible with chronic mild pleural-parenchymal scarring as seen on prior CT images. No pulmonary edema. No acute consolidative airspace disease. Stable lytic mildly expansile posterior left seventh rib lesion. Partially visualized bilateral posterior spinal fusion hardware in the lumbar spine. IMPRESSION: 1. No pneumothorax. No active cardiopulmonary disease. 2. Stable lytic mildly expansile posterior left seventh rib lesion. Electronically Signed   By: Ilona Sorrel M.D.   On: 06/25/2021 14:25   CT BONE TROCAR/NEEDLE BIOPSY DEEP  Result Date: 06/25/2021 INDICATION: 70 year old woman with history of bladder cancer presents to IR for CT guided biopsy of left seventh rib lytic lesion. EXAM: CT-guided biopsy of lytic lesion of left seventh rib MEDICATIONS: None. ANESTHESIA/SEDATION: Moderate (conscious) sedation was employed during this procedure. A total of Versed 3 mg and Fentanyl 100 mcg was administered intravenously. Moderate Sedation Time: 22 minutes. The patient's level of consciousness and vital signs were monitored continuously by radiology nursing throughout the procedure under my direct supervision. COMPLICATIONS: None immediate. PROCEDURE: Informed written consent was obtained from the patient after a thorough discussion of the procedural risks, benefits and alternatives. All questions were addressed. Maximal Sterile Barrier Technique was utilized including caps, mask, sterile gowns, sterile gloves, sterile drape, hand hygiene and skin antiseptic. A timeout was performed prior to the  initiation of the procedure. Patient positioned left lateral decubitus on the CT table. The left posterior chest wall skin prepped and draped in usual fashion. Following local administration, introducer needle was advanced into the posterior aspect of the  left seventh rib lesion and 4-18 gauge cores were obtained. Patient tolerated procedure well without complication. IMPRESSION: CT-guided biopsy of left seventh rib lesion as above. Electronically Signed   By: Miachel Roux M.D.   On: 06/25/2021 13:56     ASSESSMENT:  Left posterior seventh rib lesion: - Patient seen at the request of Dr. Wilburn Mylar - She reported discomfort in the left posterior back for the last 1 month.  She was seen by Dr. Chauncey Reading and x-rays showed abnormality.  A CT scan of the chest was done on 05/24/2021. - CT chest report reviewed by me from 05/24/2021 showed expansile oval-shaped lesion within the posterior aspect of the seventh rib with a nodule measuring 2.1 x 1.1 cm.  Adjacent cortex demonstrates area of thinning and destruction.  Stable emphysematous and interstitial changes within the lungs.  Stable pulmonary nodules within the right upper lobe and left upper lobe when compared to CT from September 2020. - CT of the abdomen and pelvis did not show any significant abnormalities. - She denies any fevers, night sweats or weight loss in the last 6 months.  She had history of basal cell carcinoma on the nose removed by Mohs surgery. - She also reported history of osteoporosis and broke left upper rib 1 year ago after sneezing. -Reviewed PET scan from 06/14/2021.  Mild hypermetabolism involving left aspect of the L5 vertebral body and also pedicle region surrounding the screw.  SUV 4.2.  There appears to be a lytic process on the CT scan.  The lytic left posterior seventh rib lesion is mildly hypermetabolic with SUV 0.21.  No other definite lytic hypermetabolic bone lesions seen. - We have reviewed left seventh rib bone biopsy from 06/25/2021 consistent with plasma cell neoplasm.  Cells are positive for CD138, CD56 and a kappa restricted.    Social/family history: - She is married and lives at home with her husband.  She has not worked but had help her husband and his work.   She is a non-smoker. - Maternal grandmother had colon cancer.  Mother had a squamous cell carcinoma of the skin.   PLAN:  IgA kappa plasma cell disorder: - We reviewed labs from 06/13/2021 which showed M spike 1.7 g.  Immunofixation showed IgA kappa.  Free light chain showed kappa light chain elevated at 71 and ratio of 8.22. - Reviewed PET scan from 06/14/2021.  Mild hypermetabolism involving left aspect of the L5 vertebral body and also pedicle region surrounding the screw.  SUV 4.2.  There appears to be a lytic process on the CT scan.  The lytic left posterior seventh rib lesion is mildly hypermetabolic with SUV 1.17.  No other definite lytic hypermetabolic bone lesions seen. - We have reviewed left seventh rib bone biopsy from 06/25/2021 consistent with plasma cell neoplasm.  Cells are positive for CD138, CD56 and a kappa restricted. - I have recommended bone marrow aspiration and biopsy with cytogenetics and myeloma FISH panel. - We will also check LDH and beta-2 microglobulin for prognostication. - We will also check 24-hour urine for total protein, UPEP, immunofixation and free light chains. - RTC 1 week after the bone marrow biopsy.  2.  Low back pain: - She reports low back pain worse in the last 24  hours with spasms. - She started taking Robaxin which is not helping much. - We will send her Percocet 5/325 every 6 hours as needed. - We have checked a UA which did not show any evidence of infection. - We will consider MRI of the lumbar spine with and without contrast if the pain does not subside.   Orders placed this encounter:  Orders Placed This Encounter  Procedures   Urine culture   CT Biopsy   CT BONE MARROW BIOPSY & ASPIRATION   Lactate dehydrogenase   Beta 2 microglobulin, serum   24 Hr Urn UIFE/Light Chains/TP QN   Urinalysis, Routine w reflex microscopic     Derek Jack, MD [place] [phone]   I, Thana Ates, am acting as a scribe  for Dr. Derek Jack.  I, Derek Jack MD, have reviewed the above documentation for accuracy and completeness, and I agree with the above.

## 2021-07-02 ENCOUNTER — Other Ambulatory Visit: Payer: Self-pay

## 2021-07-02 ENCOUNTER — Inpatient Hospital Stay (HOSPITAL_COMMUNITY): Payer: Medicare Other | Attending: Hematology | Admitting: Hematology

## 2021-07-02 ENCOUNTER — Other Ambulatory Visit (HOSPITAL_COMMUNITY): Payer: Self-pay | Admitting: *Deleted

## 2021-07-02 VITALS — BP 147/82 | HR 79 | Temp 98.1°F | Resp 18

## 2021-07-02 DIAGNOSIS — R109 Unspecified abdominal pain: Secondary | ICD-10-CM | POA: Diagnosis not present

## 2021-07-02 DIAGNOSIS — M545 Low back pain, unspecified: Secondary | ICD-10-CM | POA: Insufficient documentation

## 2021-07-02 DIAGNOSIS — R5383 Other fatigue: Secondary | ICD-10-CM | POA: Insufficient documentation

## 2021-07-02 DIAGNOSIS — Z840 Family history of diseases of the skin and subcutaneous tissue: Secondary | ICD-10-CM | POA: Diagnosis not present

## 2021-07-02 DIAGNOSIS — D729 Disorder of white blood cells, unspecified: Secondary | ICD-10-CM | POA: Diagnosis not present

## 2021-07-02 DIAGNOSIS — Z800 Family history of malignant neoplasm of digestive organs: Secondary | ICD-10-CM | POA: Diagnosis not present

## 2021-07-02 DIAGNOSIS — Z85828 Personal history of other malignant neoplasm of skin: Secondary | ICD-10-CM | POA: Diagnosis not present

## 2021-07-02 DIAGNOSIS — M899 Disorder of bone, unspecified: Secondary | ICD-10-CM | POA: Diagnosis present

## 2021-07-02 LAB — URINALYSIS, ROUTINE W REFLEX MICROSCOPIC
Bacteria, UA: NONE SEEN
Bilirubin Urine: NEGATIVE
Glucose, UA: NEGATIVE mg/dL
Ketones, ur: NEGATIVE mg/dL
Leukocytes,Ua: NEGATIVE
Nitrite: NEGATIVE
Protein, ur: NEGATIVE mg/dL
Specific Gravity, Urine: 1.016 (ref 1.005–1.030)
pH: 6 (ref 5.0–8.0)

## 2021-07-02 LAB — LACTATE DEHYDROGENASE: LDH: 117 U/L (ref 98–192)

## 2021-07-02 MED ORDER — OXYCODONE-ACETAMINOPHEN 5-325 MG PO TABS: 1.0000 | ORAL_TABLET | ORAL | 0 refills | Status: DC | PRN

## 2021-07-02 NOTE — Patient Instructions (Signed)
[place] at [facility] Discharge Instructions   You were seen and examined today by Dr. Delton Coombes. He reviewed the results of your bone biopsy. We will arrange for you to have a bone marrow biopsy. We will collect a urine sample today to rule out an infection.  We will collect additional lab work today. We sent a prescription for pain medication (oxycodone) today.  Take as prescribed.  Return as scheduled after bone marrow biopsy.    Thank you for choosing [place] at [facility] to provide your oncology and hematology care.  To afford each patient quality time with our provider, please arrive at least 15 minutes before your scheduled appointment time.   If you have a lab appointment with the [facility] please come in thru the Main Entrance and check in at the main information desk.  You need to re-schedule your appointment should you arrive 10 or more minutes late.  We strive to give you quality time with our providers, and arriving late affects you and other patients whose appointments are after yours.  Also, if you no show three or more times for appointments you may be dismissed from the clinic at the providers discretion.     Again, thank you for choosing [facility].  Our hope is that these requests will decrease the amount of time that you wait before being seen by our physicians.       _____________________________________________________________  Should you have questions after your visit to [facility], please contact our office at [phone] and follow the prompts.  Our office hours are 8:00 a.m. and 4:30 p.m. Monday - Friday.  Please note that voicemails left after 4:00 p.m. may not be returned until the following business day.  We are closed weekends and major holidays.  You do have access to a nurse 24-7, just call the main number to the clinic [phone] and do not press any options, hold on the  line and a nurse will answer the phone.    For prescription refill requests, have your pharmacy contact our office and allow 72 hours.    Due to Covid, you will need to wear a mask upon entering the hospital. If you do not have a mask, a mask will be given to you at the Main Entrance upon arrival. For doctor visits, patients may have 1 support person age 87 or older with them. For treatment visits, patients can not have anyone with them due to social distancing guidelines and our immunocompromised population.

## 2021-07-03 ENCOUNTER — Ambulatory Visit
Admission: EM | Admit: 2021-07-03 | Discharge: 2021-07-03 | Disposition: A | Discharge status: Home / Self Care | Payer: Medicare Other | Attending: Student | Admitting: Student

## 2021-07-03 ENCOUNTER — Encounter: Payer: Self-pay | Admitting: Emergency Medicine

## 2021-07-03 ENCOUNTER — Other Ambulatory Visit: Payer: Self-pay

## 2021-07-03 ENCOUNTER — Ambulatory Visit: Payer: Self-pay

## 2021-07-03 ENCOUNTER — Other Ambulatory Visit: Payer: Self-pay | Admitting: Radiology

## 2021-07-03 DIAGNOSIS — Z203 Contact with and (suspected) exposure to rabies: Secondary | ICD-10-CM | POA: Diagnosis not present

## 2021-07-03 LAB — URINE CULTURE: Culture: NO GROWTH

## 2021-07-03 LAB — BETA 2 MICROGLOBULIN, SERUM: Beta-2 Microglobulin: 1.9 mg/L (ref 0.6–2.4)

## 2021-07-03 MED ORDER — RABIES VACCINE, PCEC IM SUSR
1.0000 mL | Freq: Once | INTRAMUSCULAR | Status: AC
  Administered 2021-07-03: 11:00:00 1 mL via INTRAMUSCULAR

## 2021-07-03 NOTE — ED Triage Notes (Signed)
Here for 3rd rabies vaccine.

## 2021-07-04 ENCOUNTER — Ambulatory Visit (HOSPITAL_COMMUNITY)
Admission: RE | Admit: 2021-07-04 | Discharge: 2021-07-04 | Disposition: A | Discharge status: Home / Self Care | Payer: Medicare Other | Source: Ambulatory Visit | Attending: Hematology | Admitting: Hematology

## 2021-07-04 ENCOUNTER — Other Ambulatory Visit: Payer: Self-pay

## 2021-07-04 ENCOUNTER — Other Ambulatory Visit (HOSPITAL_COMMUNITY): Payer: Self-pay

## 2021-07-04 ENCOUNTER — Other Ambulatory Visit (HOSPITAL_COMMUNITY): Payer: Self-pay | Admitting: *Deleted

## 2021-07-04 ENCOUNTER — Encounter (HOSPITAL_COMMUNITY): Payer: Self-pay

## 2021-07-04 DIAGNOSIS — D729 Disorder of white blood cells, unspecified: Secondary | ICD-10-CM

## 2021-07-04 DIAGNOSIS — M899 Disorder of bone, unspecified: Secondary | ICD-10-CM | POA: Diagnosis not present

## 2021-07-04 DIAGNOSIS — R0609 Other forms of dyspnea: Secondary | ICD-10-CM | POA: Insufficient documentation

## 2021-07-04 DIAGNOSIS — R059 Cough, unspecified: Secondary | ICD-10-CM | POA: Insufficient documentation

## 2021-07-04 DIAGNOSIS — D649 Anemia, unspecified: Secondary | ICD-10-CM | POA: Diagnosis not present

## 2021-07-04 DIAGNOSIS — M549 Dorsalgia, unspecified: Secondary | ICD-10-CM | POA: Diagnosis not present

## 2021-07-04 DIAGNOSIS — R110 Nausea: Secondary | ICD-10-CM | POA: Insufficient documentation

## 2021-07-04 DIAGNOSIS — R519 Headache, unspecified: Secondary | ICD-10-CM | POA: Diagnosis not present

## 2021-07-04 DIAGNOSIS — C9000 Multiple myeloma not having achieved remission: Secondary | ICD-10-CM | POA: Diagnosis not present

## 2021-07-04 LAB — CBC WITH DIFFERENTIAL/PLATELET
Abs Immature Granulocytes: 0.02 10*3/uL (ref 0.00–0.07)
Basophils Absolute: 0 10*3/uL (ref 0.0–0.1)
Basophils Relative: 1 %
Eosinophils Absolute: 0.1 10*3/uL (ref 0.0–0.5)
Eosinophils Relative: 2 %
HCT: 34.4 % — ABNORMAL LOW (ref 36.0–46.0)
Hemoglobin: 11.6 g/dL — ABNORMAL LOW (ref 12.0–15.0)
Immature Granulocytes: 0 %
Lymphocytes Relative: 15 %
Lymphs Abs: 1 10*3/uL (ref 0.7–4.0)
MCH: 31.9 pg (ref 26.0–34.0)
MCHC: 33.7 g/dL (ref 30.0–36.0)
MCV: 94.5 fL (ref 80.0–100.0)
Monocytes Absolute: 0.3 10*3/uL (ref 0.1–1.0)
Monocytes Relative: 5 %
Neutro Abs: 4.7 10*3/uL (ref 1.7–7.7)
Neutrophils Relative %: 77 %
Platelets: 299 10*3/uL (ref 150–400)
RBC: 3.64 MIL/uL — ABNORMAL LOW (ref 3.87–5.11)
RDW: 14.6 % (ref 11.5–15.5)
WBC: 6.2 10*3/uL (ref 4.0–10.5)
nRBC: 0 % (ref 0.0–0.2)

## 2021-07-04 IMAGING — CT CT BIOPSY
1 of 2 series · 15 of 27 positions shown, 19 images · non-contrast
Comparison: none

CLINICAL DATA: Plasma cell neoplasm

EXAM:
CT GUIDED DEEP ILIAC BONE ASPIRATION AND CORE BIOPSY
TECHNIQUE: Patient was placed prone on the CT gantry and limited axial scans
through the pelvis were obtained. Appropriate skin entry site was
identified. Skin site was marked, prepped with chlorhexidine, draped
in usual sterile fashion, and infiltrated locally with 1% lidocaine.

[Series 2: i-spiral 5.0 br40 · axial · 0.98mm/px · z∈[-173,-110]mm · 15 of 21 slices shown, 19 images]
[im 2/21  mediastinal]
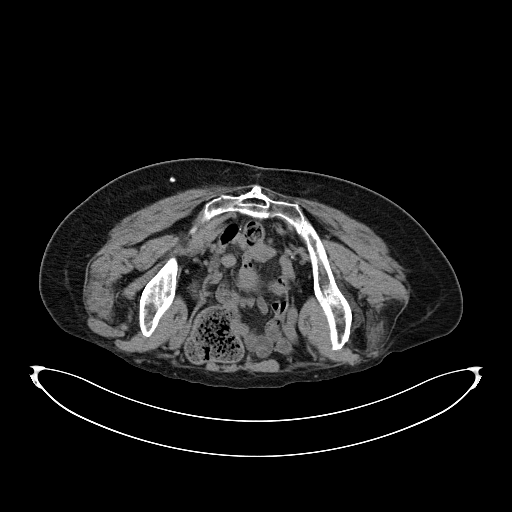
[im 2/21  lung]
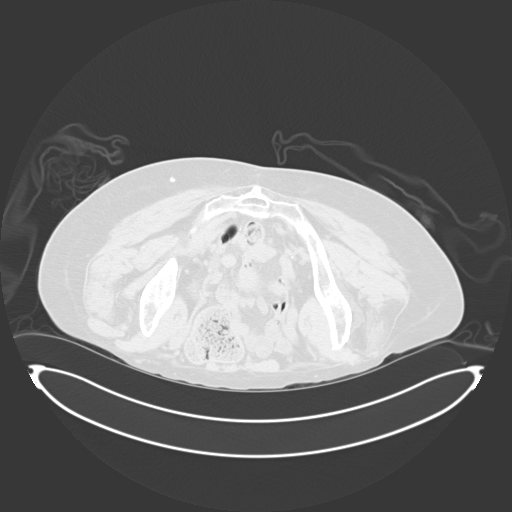
[im 3/21  lung]
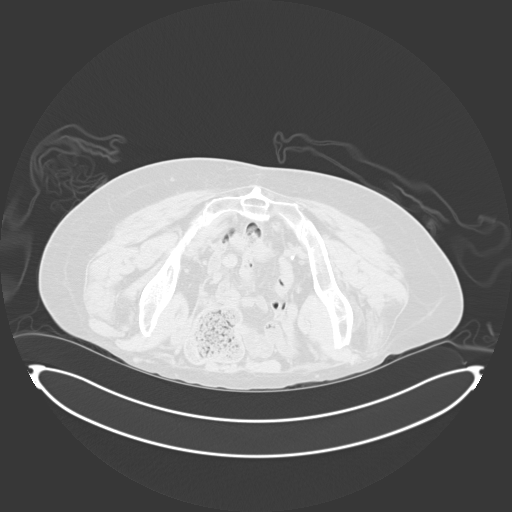
[im 5/21  lung]
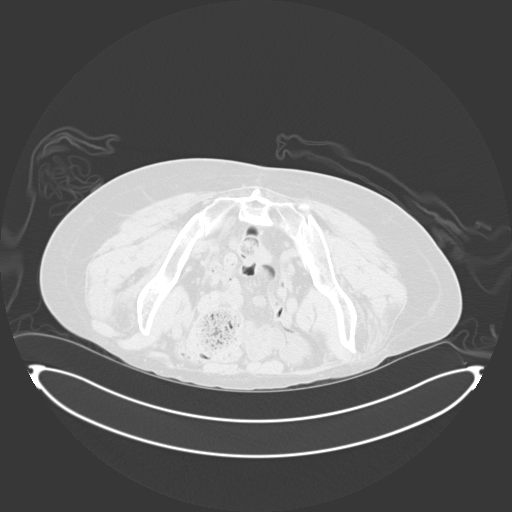
[im 6/21  lung]
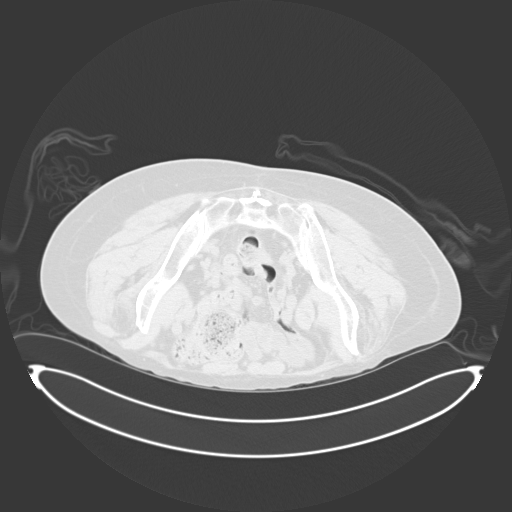
[im 7/21  mediastinal]
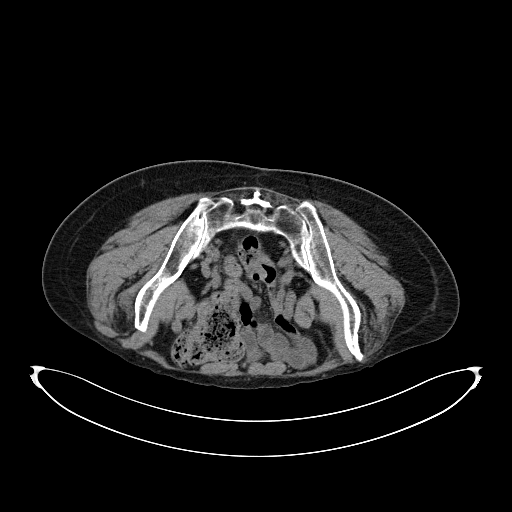
[im 7/21  lung]
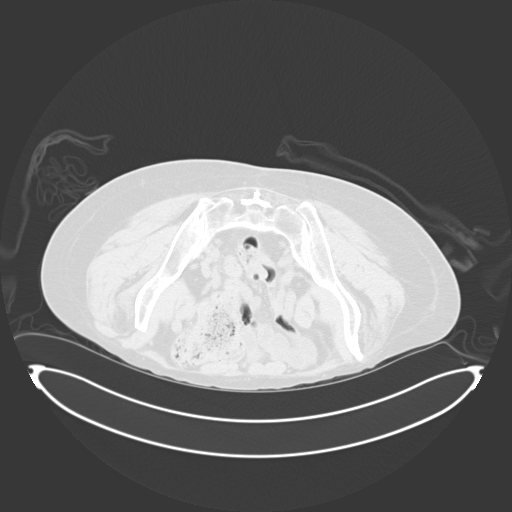
[im 8/21  lung]
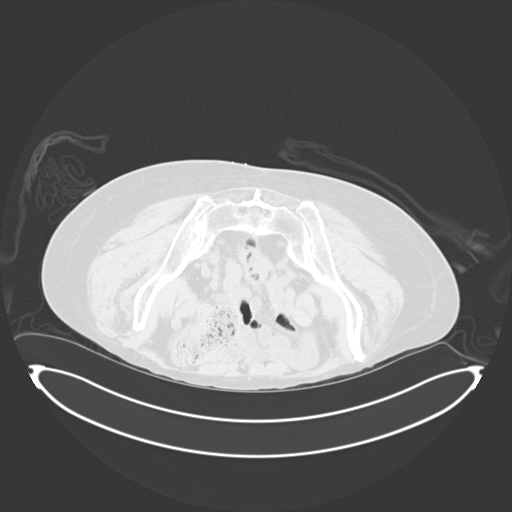
[im 10/21  lung]
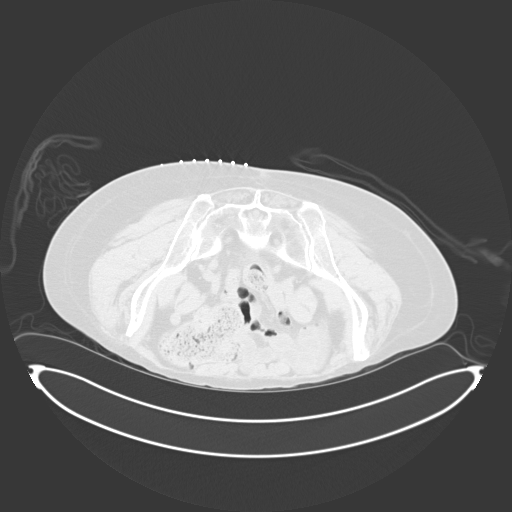
[im 11/21  lung]
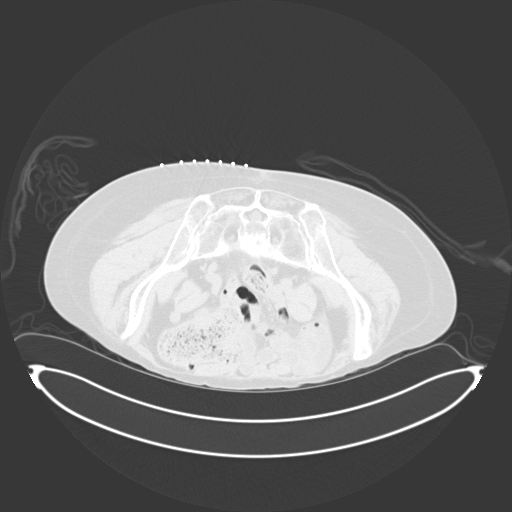
[im 12/21  mediastinal]
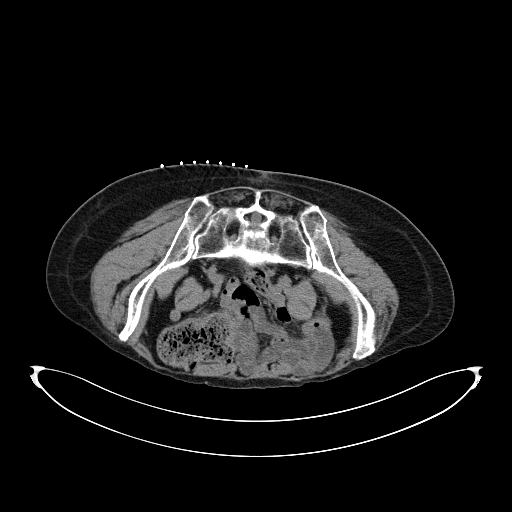
[im 12/21  lung]
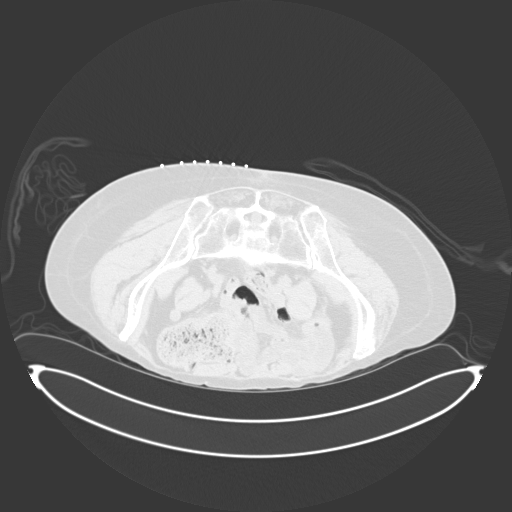
[im 14/21  lung]
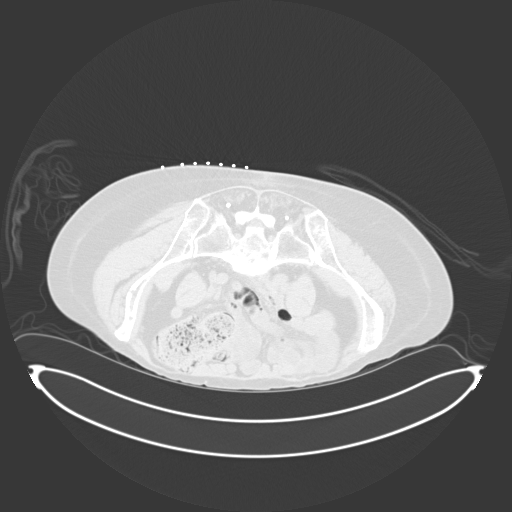
[im 15/21  lung]
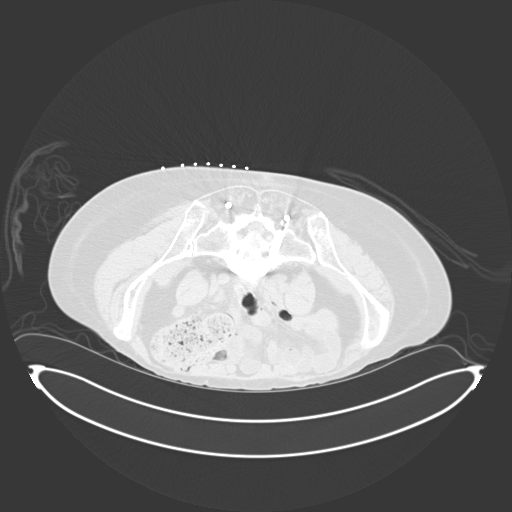
[im 16/21  lung]
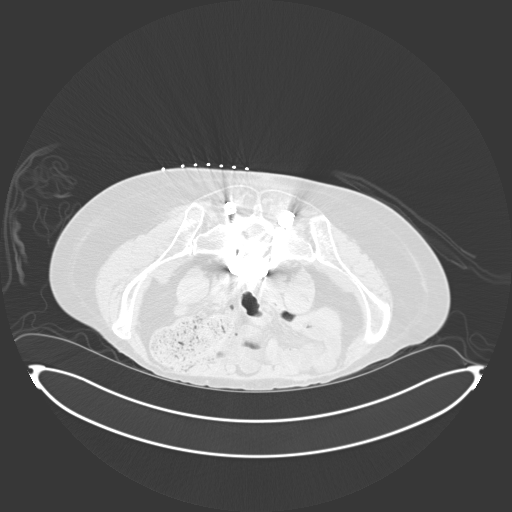
[im 17/21  mediastinal]
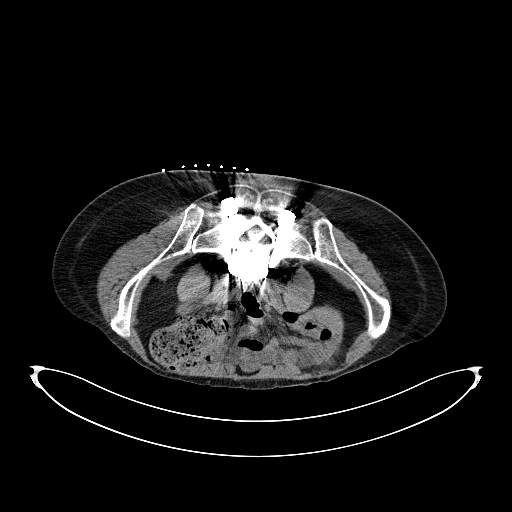
[im 17/21  lung]
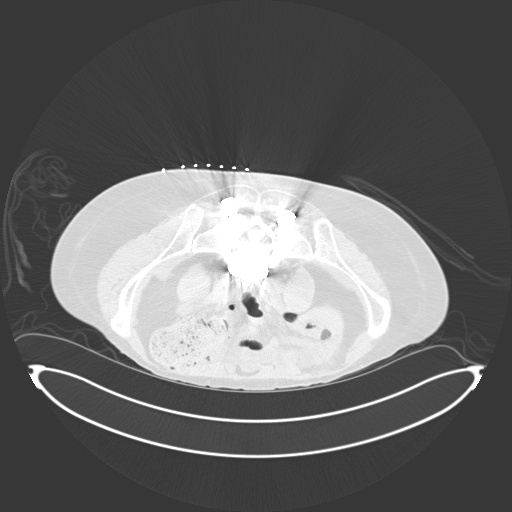
[im 19/21  lung]
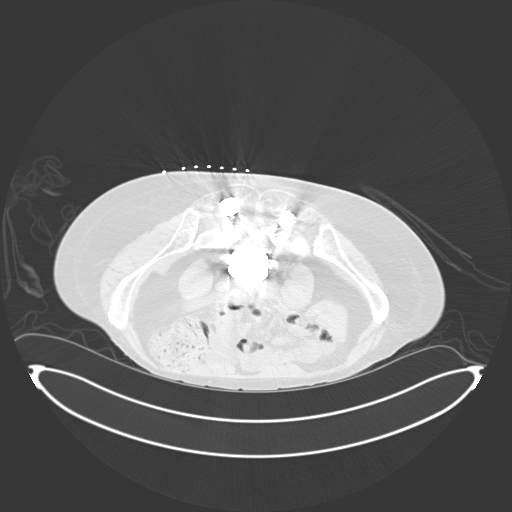
[im 20/21  lung]
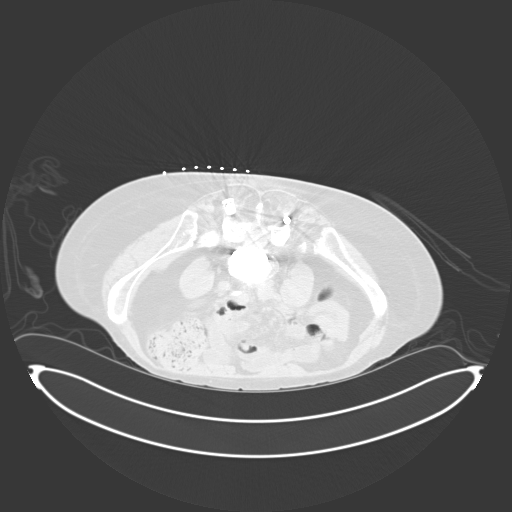

[15 of 27 positions shown; findings below may reference images not displayed]

Intravenous Fentanyl [WH] and Versed 2mg were administered as
conscious sedation during continuous monitoring of the patient's
level of consciousness and physiological / cardiorespiratory status
by the radiology RN, with a total moderate sedation time of 24
minutes.

Under CT fluoroscopic guidance an 11-gauge Cook trocar bone needle
was advanced into the right iliac bone just lateral to the
sacroiliac joint. Once needle tip position was confirmed, core and
aspiration samples were obtained, submitted to pathology for
approval. Patient tolerated procedure well.

COMPLICATIONS:
COMPLICATIONS
none
IMPRESSION: 1. Technically successful CT guided right iliac bone core and
aspiration biopsy.

## 2021-07-04 IMAGING — CT CT BIOPSY AND ASPIRATION BONE MARROW
1 of 2 series · 15 of 27 positions shown, 19 images · non-contrast
Comparison: none

CLINICAL DATA: Plasma cell neoplasm

EXAM:
CT GUIDED DEEP ILIAC BONE ASPIRATION AND CORE BIOPSY
TECHNIQUE: Patient was placed prone on the CT gantry and limited axial scans
through the pelvis were obtained. Appropriate skin entry site was
identified. Skin site was marked, prepped with chlorhexidine, draped
in usual sterile fashion, and infiltrated locally with 1% lidocaine.

[Series 2: i-spiral 5.0 br40 · axial · 0.98mm/px · z∈[-173,-110]mm · 15 of 21 slices shown, 19 images]
[im 2/21  mediastinal]
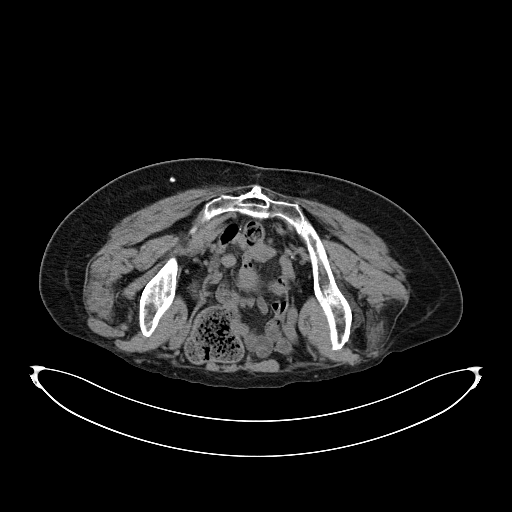
[im 2/21  lung]
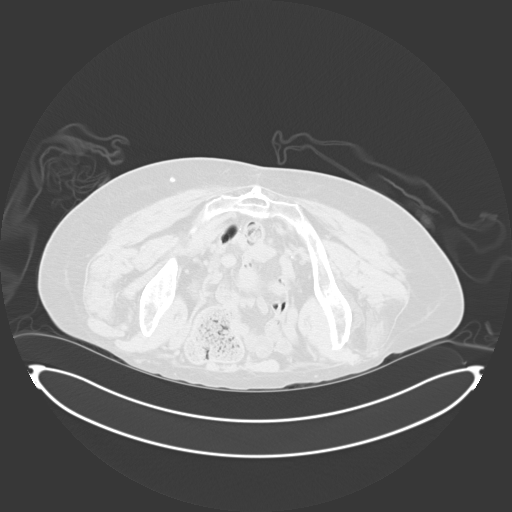
[im 3/21  lung]
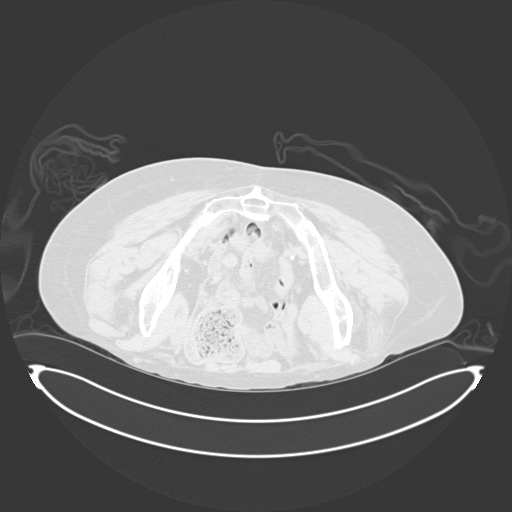
[im 5/21  lung]
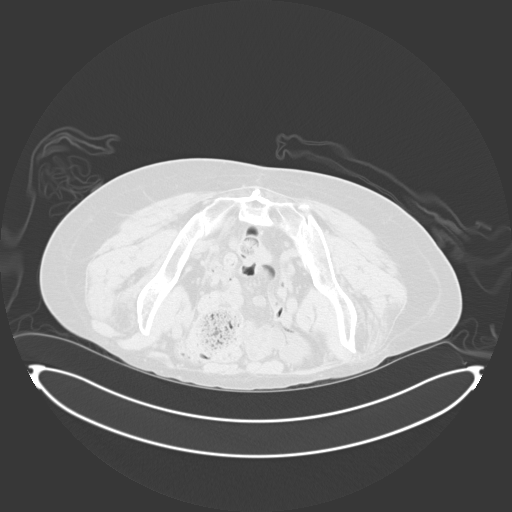
[im 6/21  lung]
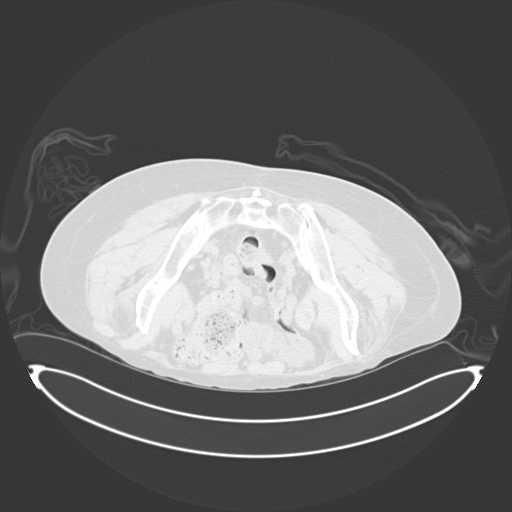
[im 7/21  mediastinal]
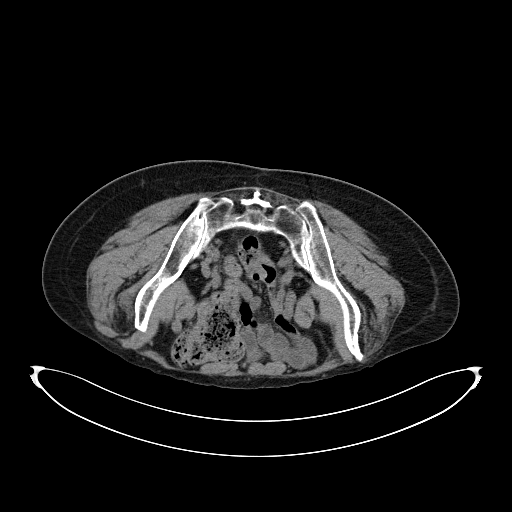
[im 7/21  lung]
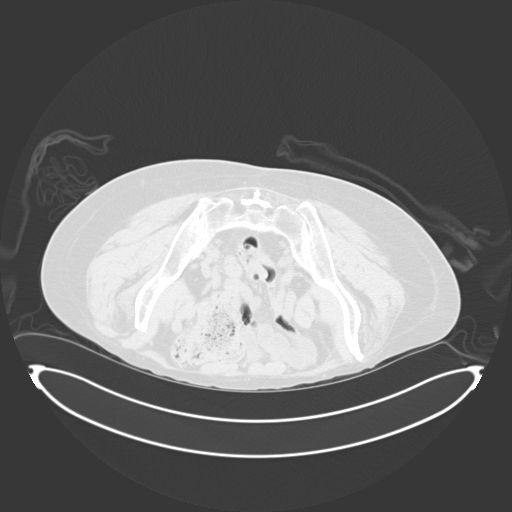
[im 8/21  lung]
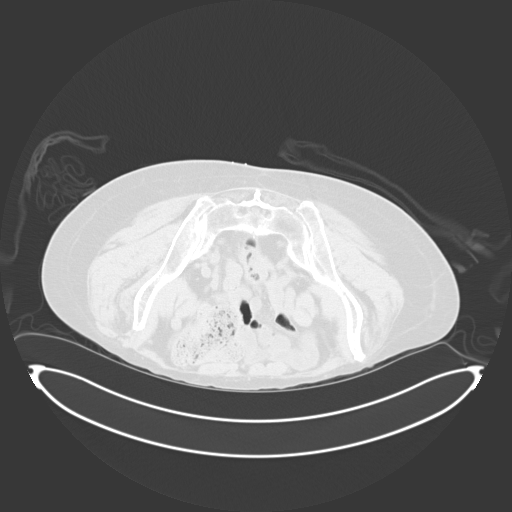
[im 10/21  lung]
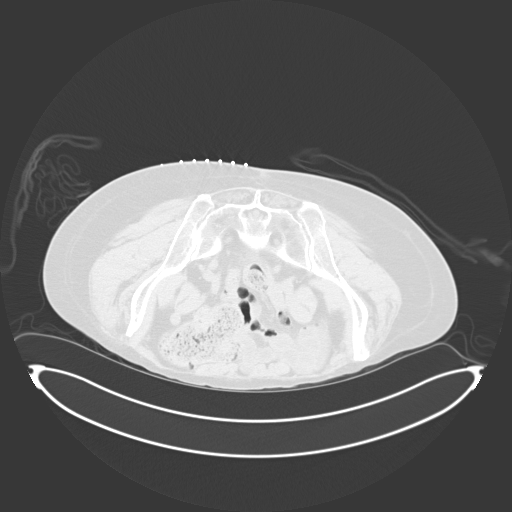
[im 11/21  lung]
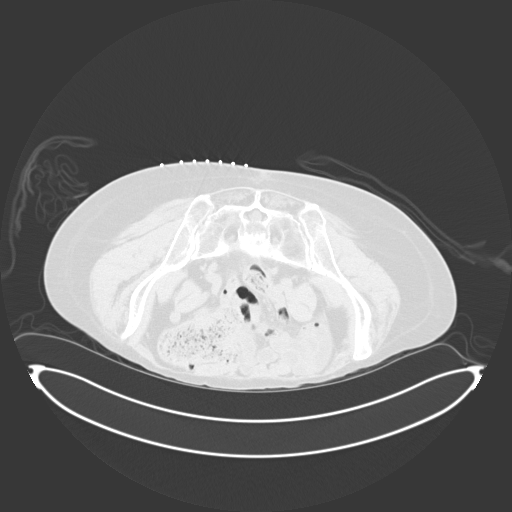
[im 12/21  mediastinal]
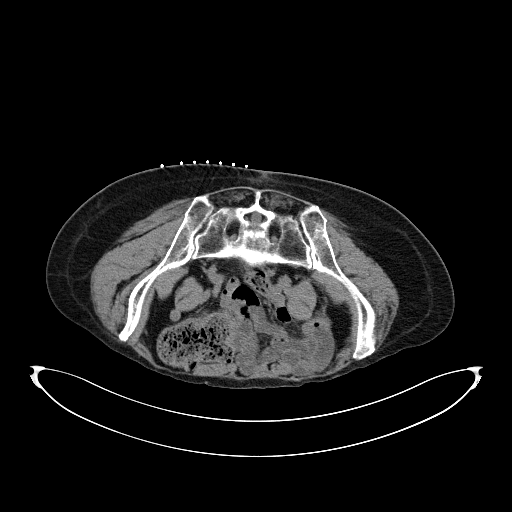
[im 12/21  lung]
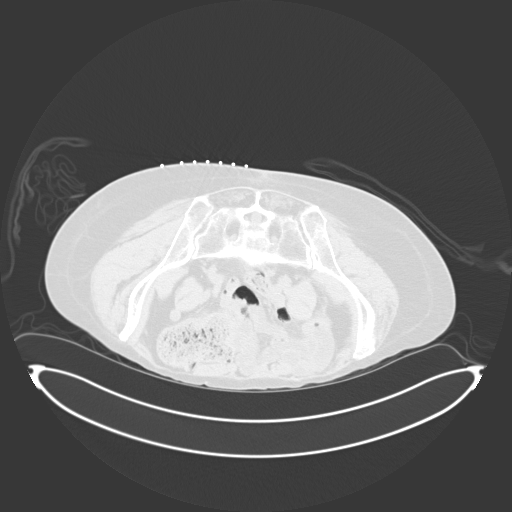
[im 14/21  lung]
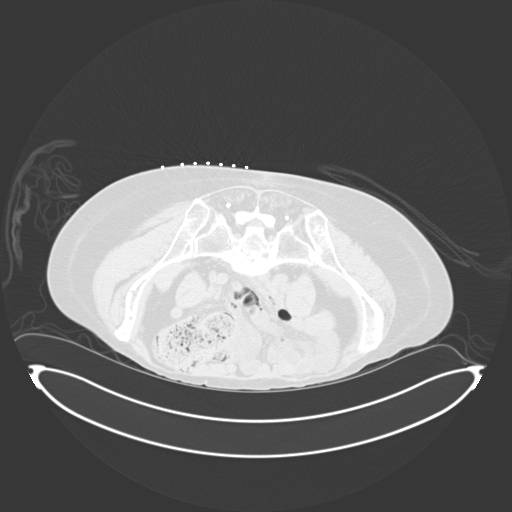
[im 15/21  lung]
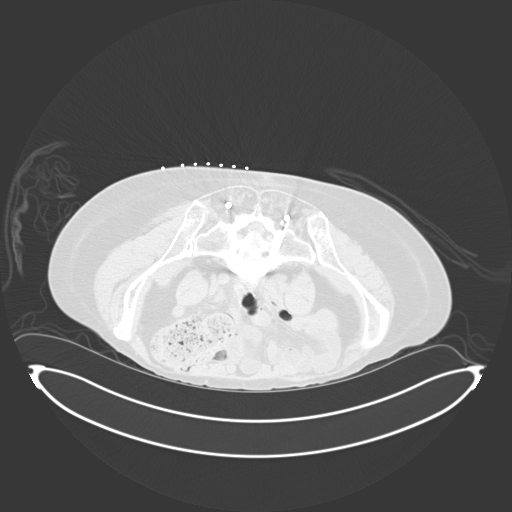
[im 16/21  lung]
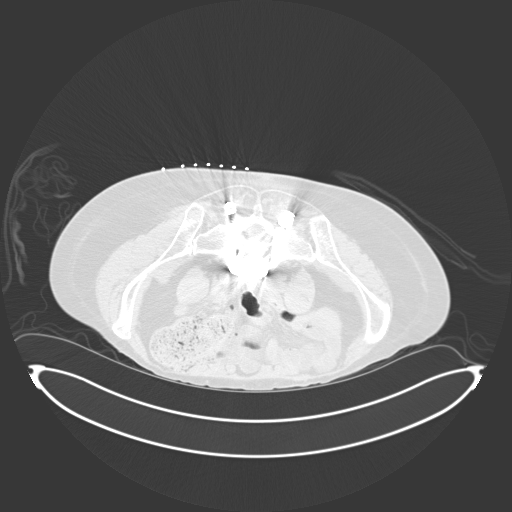
[im 17/21  mediastinal]
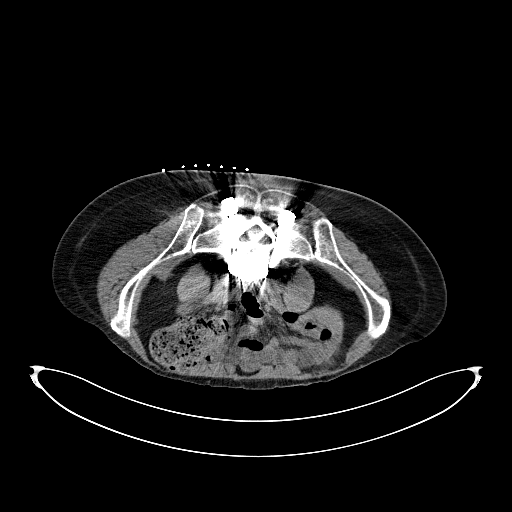
[im 17/21  lung]
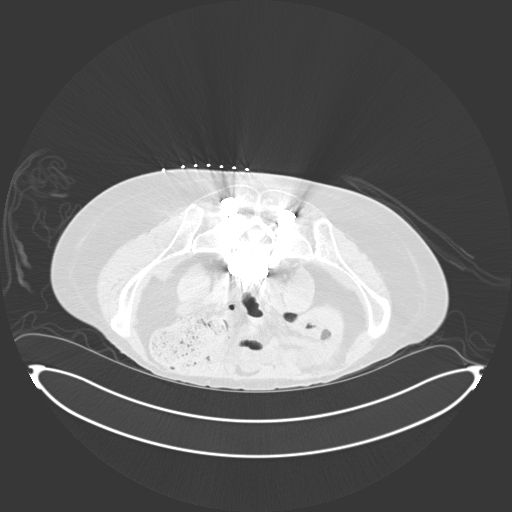
[im 19/21  lung]
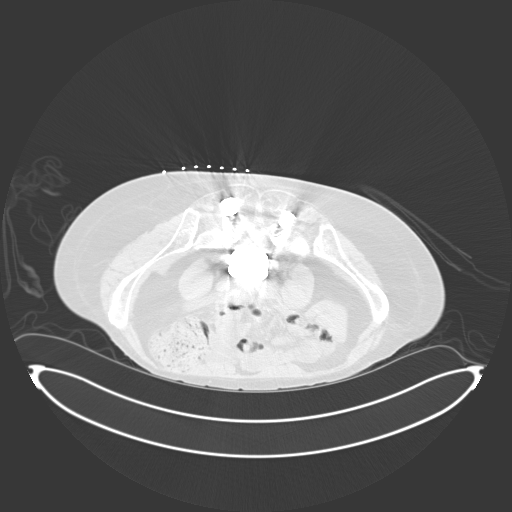
[im 20/21  lung]
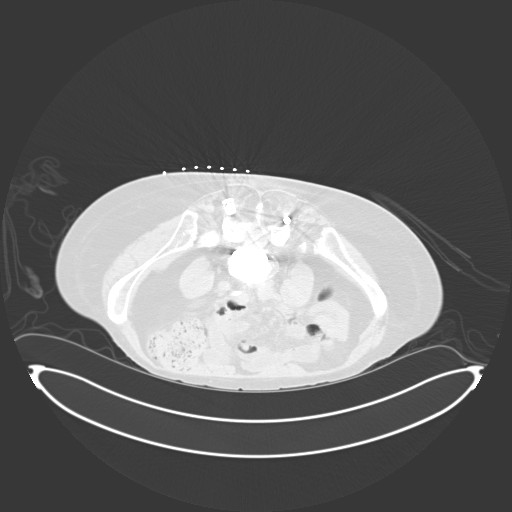

[15 of 27 positions shown; findings below may reference images not displayed]

Intravenous Fentanyl [WH] and Versed 2mg were administered as
conscious sedation during continuous monitoring of the patient's
level of consciousness and physiological / cardiorespiratory status
by the radiology RN, with a total moderate sedation time of 24
minutes.

Under CT fluoroscopic guidance an 11-gauge Cook trocar bone needle
was advanced into the right iliac bone just lateral to the
sacroiliac joint. Once needle tip position was confirmed, core and
aspiration samples were obtained, submitted to pathology for
approval. Patient tolerated procedure well.

COMPLICATIONS:
COMPLICATIONS
none
IMPRESSION: 1. Technically successful CT guided right iliac bone core and
aspiration biopsy.

## 2021-07-04 MED ORDER — FENTANYL CITRATE (PF) 100 MCG/2ML IJ SOLN
INTRAMUSCULAR | Status: AC | PRN
  Administered 2021-07-04: 50 ug via INTRAVENOUS

## 2021-07-04 MED ORDER — MIDAZOLAM HCL 2 MG/2ML IJ SOLN
INTRAMUSCULAR | Status: AC
  Filled 2021-07-04: qty 2

## 2021-07-04 MED ORDER — METHOCARBAMOL 500 MG PO TABS
500.0000 mg | ORAL_TABLET | ORAL | Status: AC
  Administered 2021-07-04: 10:00:00 500 mg via ORAL
  Filled 2021-07-04: qty 1

## 2021-07-04 MED ORDER — FENTANYL CITRATE (PF) 100 MCG/2ML IJ SOLN
INTRAMUSCULAR | Status: AC | PRN
  Administered 2021-07-04: 25 ug via INTRAVENOUS

## 2021-07-04 MED ORDER — ONDANSETRON HCL 4 MG/2ML IJ SOLN
INTRAMUSCULAR | Status: AC
  Filled 2021-07-04: qty 2

## 2021-07-04 MED ORDER — FENTANYL CITRATE (PF) 100 MCG/2ML IJ SOLN
INTRAMUSCULAR | Status: AC
  Filled 2021-07-04: qty 2

## 2021-07-04 MED ORDER — MIDAZOLAM HCL 2 MG/2ML IJ SOLN
INTRAMUSCULAR | Status: AC | PRN
  Administered 2021-07-04: .5 mg via INTRAVENOUS

## 2021-07-04 MED ORDER — HYDROCODONE-ACETAMINOPHEN 5-325 MG PO TABS: 1.0000 | ORAL_TABLET | ORAL | Status: DC | PRN

## 2021-07-04 MED ORDER — LIDOCAINE HCL (PF) 1 % IJ SOLN
INTRAMUSCULAR | Status: AC | PRN
  Administered 2021-07-04: 10 mL via INTRADERMAL

## 2021-07-04 MED ORDER — FLUMAZENIL 0.5 MG/5ML IV SOLN
INTRAVENOUS | Status: AC
  Filled 2021-07-04: qty 5

## 2021-07-04 MED ORDER — NALOXONE HCL 0.4 MG/ML IJ SOLN
INTRAMUSCULAR | Status: AC
  Filled 2021-07-04: qty 1

## 2021-07-04 MED ORDER — SODIUM CHLORIDE 0.9 % IV SOLN: INTRAVENOUS | Status: DC

## 2021-07-04 MED ORDER — MIDAZOLAM HCL 2 MG/2ML IJ SOLN
INTRAMUSCULAR | Status: AC | PRN
  Administered 2021-07-04: 1 mg via INTRAVENOUS

## 2021-07-04 NOTE — Discharge Instructions (Signed)
Please call Interventional Radiology clinic [phone] with any questions or concerns. ° °You may remove your dressing and shower tomorrow. ° ° °Bone Marrow Aspiration and Bone Marrow Biopsy, Adult, Care After °This sheet gives you information about how to care for yourself after your procedure. Your health care provider may also give you more specific instructions. If you have problems or questions, contact your health care provider. °What can I expect after the procedure? °After the procedure, it is common to have: °Mild pain and tenderness. °Swelling. °Bruising. °Follow these instructions at home: °Puncture site care °Follow instructions from your health care provider about how to take care of the puncture site. Make sure you: °Wash your hands with soap and water before and after you change your bandage (dressing). If soap and water are not available, use hand sanitizer. °Change your dressing as told by your health care provider. °Check your puncture site every day for signs of infection. Check for: °More redness, swelling, or pain. °Fluid or blood. °Warmth. °Pus or a bad smell.   °Activity °Return to your normal activities as told by your health care provider. Ask your health care provider what activities are safe for you. °Do not lift anything that is heavier than 10 lb (4.5 kg), or the limit that you are told, until your health care provider says that it is safe. °Do not drive for 24 hours if you were given a sedative during your procedure. °General instructions °Take over-the-counter and prescription medicines only as told by your health care provider. °Do not take baths, swim, or use a hot tub until your health care provider approves. Ask your health care provider if you may take showers. You may only be allowed to take sponge baths. °If directed, put ice on the affected area. To do this: °Put ice in a plastic bag. °Place a towel between your skin and the bag. °Leave the ice on for 20 minutes, 2-3 times a  day. °Keep all follow-up visits as told by your health care provider. This is important.   °Contact a health care provider if: °Your pain is not controlled with medicine. °You have a fever. °You have more redness, swelling, or pain around the puncture site. °You have fluid or blood coming from the puncture site. °Your puncture site feels warm to the touch. °You have pus or a bad smell coming from the puncture site. °Summary °After the procedure, it is common to have mild pain, tenderness, swelling, and bruising. °Follow instructions from your health care provider about how to take care of the puncture site and what activities are safe for you. °Take over-the-counter and prescription medicines only as told by your health care provider. °Contact a health care provider if you have any signs of infection, such as fluid or blood coming from the puncture site. °This information is not intended to replace advice given to you by your health care provider. Make sure you discuss any questions you have with your health care provider. °Document Revised: 11/17/2018 Document Reviewed: 11/17/2018 °Elsevier Patient Education © 2021 [employer] ° ° °Moderate Conscious Sedation, Adult, Care After °This sheet gives you information about how to care for yourself after your procedure. Your health care provider may also give you more specific instructions. If you have problems or questions, contact your health care provider. °What can I expect after the procedure? °After the procedure, it is common to have: °Sleepiness for several hours. °Impaired judgment for several hours. °Difficulty with balance. °Vomiting if you eat too   soon. °Follow these instructions at home: °For the time period you were told by your health care provider: °Rest. °Do not participate in activities where you could fall or become injured. °Do not drive or use machinery. °Do not drink alcohol. °Do not take sleeping pills or medicines that cause drowsiness. °Do not  make important decisions or sign legal documents. °Do not take care of children on your own.  °  °  °Eating and drinking °Follow the diet recommended by your health care provider. °Drink enough fluid to keep your urine pale yellow. °If you vomit: °Drink water, juice, or soup when you can drink without vomiting. °Make sure you have little or no nausea before eating solid foods.   °General instructions °Take over-the-counter and prescription medicines only as told by your health care provider. °Have a responsible adult stay with you for the time you are told. It is important to have someone help care for you until you are awake and alert. °Do not smoke. °Keep all follow-up visits as told by your health care provider. This is important. °Contact a health care provider if: °You are still sleepy or having trouble with balance after 24 hours. °You feel light-headed. °You keep feeling nauseous or you keep vomiting. °You develop a rash. °You have a fever. °You have redness or swelling around the IV site. °Get help right away if: °You have trouble breathing. °You have new-onset confusion at home. °Summary °After the procedure, it is common to feel sleepy, have impaired judgment, or feel nauseous if you eat too soon. °Rest after you get home. Know the things you should not do after the procedure. °Follow the diet recommended by your health care provider and drink enough fluid to keep your urine pale yellow. °Get help right away if you have trouble breathing or new-onset confusion at home. °This information is not intended to replace advice given to you by your health care provider. Make sure you discuss any questions you have with your health care provider. °Document Revised: 10/29/2019 Document Reviewed: 05/27/2019 °Elsevier Patient Education © 2021 [employer]  °

## 2021-07-04 NOTE — H&P (Signed)
Referring Physician(s): Katragadda,Sreedhar  Supervising Physician: Arne Cleveland  Patient Status:  [facility]  Chief Complaint:  "I'm here for a bone marrow biopsy"  Subjective: Patient familiar to IR service from CT-guided biopsy of left seventh rib lesion on 06/25/2021.  She has a history of recently diagnosed plasma cell neoplasm and presents again today for CT-guided bone marrow biopsy for further evaluation.  She currently denies fever, chest pain, vomiting or bleeding.  She does have occasional headaches, chronic dyspnea/cough as well as intermittent abdominal /back pain and nausea.  Additional history as below.  Past Medical History:  Diagnosis Date   Arthritis    osteoarthritis   Back pain    Bronchiectasis (HCC)    Cancer (HCC)    basal cell nose   Chronic cough    Complication of anesthesia    Dyspnea    Gastrointestinal symptoms    GERD (gastroesophageal reflux disease)    Glaucoma    History of hiatal hernia    Hyperlipemia    Hypertension    Osteopenia    Pneumonia    PONV (postoperative nausea and vomiting)    "only one time"   Spondylisthesis    Past Surgical History:  Procedure Laterality Date   ABDOMINAL EXPOSURE N/A 12/29/2018   Procedure: ABDOMINAL EXPOSURE;  Surgeon: Angelia Mould, MD;  Location: [facility] OR;  Service: Vascular;  Laterality: N/A;   ABDOMINOPLASTY  2002   ABDOMINOPLASTY  1970's   ANKLE SURGERY  10/2015   ANTERIOR LATERAL LUMBAR FUSION WITH PERCUTANEOUS SCREW 3 LEVEL Left 12/29/2018   Procedure: Lumbar Two to Lumbar Five Anterolateral lumbar interbody fusion;  Surgeon: Erline Levine, MD;  Location: [place];  Service: Neurosurgery;  Laterality: Left;  anterolateral   ANTERIOR LUMBAR FUSION N/A 12/29/2018   Procedure: Lumbar Five Sacral One Anterior lumbar interbody fusion;  Surgeon: Erline Levine, MD;  Location: [place];  Service: Neurosurgery;  Laterality: N/A;  anterior approach   BASAL CELL CARCINOMA EXCISION  06/2018   nose    BLEPHAROPLASTY Bilateral 1999   BREAST ENHANCEMENT SURGERY  1970's   COLONOSCOPY     CYSTOURETHROSCOPY  2018   Doble J ureteal stents   DECOMPRESSION CORE HIP Left 2014   FACIAL COSMETIC SURGERY  2004   FLUOROSCOPY GUIDANCE     HIP ARTHROPLASTY Left    INCISIONAL HERNIA REPAIR N/A 10/08/2019   Procedure: OPEN INCISIONAL HERNIA REPAIR WITH MESH;  Surgeon: Armandina Gemma, MD;  Location: [facility];  Service: General;  Laterality: N/A;   JOINT REPLACEMENT Left    05/2014   LAPAROSCOPIC PARTIAL COLECTOMY  06/26/2017   LUMBAR PERCUTANEOUS PEDICLE SCREW 4 LEVEL N/A 12/29/2018   Procedure: Lumbar Percutaneous Pedicle Screw Placement Lumbar two-Sacral one;  Surgeon: Erline Levine, MD;  Location: [facility];  Service: Neurosurgery;  Laterality: N/A;   MOHS SURGERY  2019   nose   PARTIAL COLECTOMY  06/2017   for diverticulitis   REMOVAL OF BILATERAL TISSUE EXPANDERS WITH PLACEMENT OF BILATERAL BREAST IMPLANTS  2004   THIGH LIFT  1994   TOE FUSION Right    great toe   TONSILLECTOMY     UMBILICAL HERNIA REPAIR     with tummy tuck   UMBILICAL HERNIA REPAIR  2002      Allergies: Tobramycin-dexamethasone, Ethambutol, Neomycin-polymyxin-dexameth, Amikacin, Biaxin [clarithromycin], Cefoxitin, Sulfamethoxazole-trimethoprim, Vancomycin, and Bacitracin-polymyxin b  Medications: Prior to Admission medications   Medication Sig Start Date End Date Taking? Authorizing Provider  albuterol (VENTOLIN HFA) 108 (90 Base) MCG/ACT  inhaler Inhale 2 puffs into the lungs every 6 (six) hours as needed for wheezing or shortness of breath.    [provider]  azithromycin (ZITHROMAX) 250 MG tablet Take 250 mg by mouth daily.    [provider]  Bedaquiline Fumarate (SIRTURO) 100 MG TABS Take 200 mg by mouth every Monday, Wednesday, and Friday. 07/03/20   [provider]  benzonatate (TESSALON) 100 MG capsule Take by mouth 3 (three) times daily as needed for cough.    [provider]   Biotin 1000 MCG tablet Take 1,000 mcg by mouth daily.    [provider]  Calcium Carb-Cholecalciferol 600-800 MG-UNIT TABS Take 1 tablet by mouth daily.    [provider]  dextromethorphan-guaiFENesin (MUCINEX DM) 30-600 MG 12hr tablet Take 1 tablet by mouth daily.     [provider]  estrogen, conjugated,-medroxyprogesterone (PREMPRO) 0.45-1.5 MG tablet Take 1 tablet by mouth daily.    [provider]  fluticasone (FLONASE) 50 MCG/ACT nasal spray Place 1 spray into both nostrils daily.    [provider]  hydrochlorothiazide (HYDRODIURIL) 25 MG tablet Take 25 mg by mouth daily.    [provider]  latanoprost (XALATAN) 0.005 % ophthalmic solution Place 1 drop into both eyes at bedtime.     [provider]  linaclotide (LINZESS) 145 MCG CAPS capsule Take 145 mcg by mouth daily before breakfast.    [provider]  lisinopril (ZESTRIL) 20 MG tablet Take 20 mg by mouth daily.    [provider]  loratadine (CLARITIN) 10 MG tablet Take 10 mg by mouth daily.    [provider]  methocarbamol (ROBAXIN) 500 MG tablet Take 1 tablet (500 mg total) by mouth every 6 (six) hours as needed for muscle spasms. 01/05/19   Kristeen Miss, MD  omeprazole (PRILOSEC) 20 MG capsule Take 20 mg by mouth daily.    [provider]  ondansetron (ZOFRAN ODT) 4 MG disintegrating tablet Take 1 tablet (4 mg total) by mouth every 4 (four) hours as needed for nausea or vomiting. 01/19/19   Charlesetta Shanks, MD  oxyCODONE-acetaminophen (PERCOCET/ROXICET) 5-325 MG tablet Take 1 tablet by mouth every 4 (four) hours as needed for severe pain. 07/02/21   Derek Jack, MD  pantoprazole (PROTONIX) 40 MG tablet Take 40 mg by mouth daily.    [provider]  SUMAtriptan (IMITREX) 100 MG tablet Take 100 mg by mouth every 2 (two) hours as needed for migraine. May repeat in 2 hours if headache persists or recurs.    [provider]     Vital Signs: BP (!) 141/85    Pulse 90    Temp 99.2 F (37.3 C) (Oral)    Resp 16    SpO2 100%   Physical Exam awake, alert.  Chest clear to auscultation bilaterally.  Heart with regular rate and rhythm.  Abdomen soft, positive bowel sounds, some mild generalized tenderness to palpation.  No lower extremity edema.  Imaging: No results found.  Labs:  CBC: Recent Labs    06/13/21 0902 06/25/21 1018  WBC 4.2 4.3  HGB 12.2 13.1  HCT 37.3 39.6  PLT 340 433*    COAGS: No results for input(s): INR, APTT in the last 8760 hours.  BMP: Recent Labs    06/13/21 0902  NA 133*  K 4.5  CL 96*  CO2 28  GLUCOSE 94  BUN 19  CALCIUM 9.6  CREATININE 0.72  GFRNONAA >60    LIVER FUNCTION  TESTS: Recent Labs    06/13/21 0902  BILITOT 0.3  AST 26  ALT 23  ALKPHOS 75  PROT 8.8*  ALBUMIN 4.2    Assessment and Plan: Patient familiar to IR service from CT-guided biopsy of left seventh rib lesion on 06/25/2021.  She has a history of recently diagnosed plasma cell neoplasm and presents again today for CT-guided bone marrow biopsy for further evaluation.  Risks and benefits of procedure was discussed with the patient  including, but not limited to bleeding, infection, damage to adjacent structures or low yield requiring additional tests.  All of the questions were answered and there is agreement to proceed.  Consent signed and in chart.    Electronically Signed: D. Rowe Robert, PA-C 07/04/2021, 8:14 AM   I spent a total of 20 minutes at the the patient's bedside AND on the patient's hospital floor or unit, greater than 50% of which was counseling/coordinating care for CT-guided bone marrow biopsy

## 2021-07-04 NOTE — Procedures (Signed)
Procedure: CT bone marrow biopsy R iliac EBL:   minimal Complications:  none immediate  See full dictation in [facility] P[facility].  D. Larhonda Dettloff MD Main # [phone] Pager  [phone] Mobile [phone]     

## 2021-07-05 ENCOUNTER — Ambulatory Visit (HOSPITAL_COMMUNITY)
Admission: RE | Admit: 2021-07-05 | Discharge: 2021-07-05 | Disposition: A | Discharge status: Home / Self Care | Payer: Medicare Other | Source: Ambulatory Visit | Attending: Hematology | Admitting: Hematology

## 2021-07-05 DIAGNOSIS — D729 Disorder of white blood cells, unspecified: Secondary | ICD-10-CM | POA: Insufficient documentation

## 2021-07-05 IMAGING — MR MR LUMBAR SPINE WO/W CM
5 of 7 series · 33 of 48 positions shown · IV contrast (gadavist)
Comparison: Lumbar MRI [DATE].

CLINICAL DATA: Low back pain with bilateral leg pain. Cancer
suspected. History of spinal fusion.

EXAM:
MRI LUMBAR SPINE WITHOUT AND WITH CONTRAST
TECHNIQUE: Multiplanar and multiecho pulse sequences of the lumbar spine were
obtained without and with intravenous contrast.
CONTRAST:  7mL GADAVIST GADOBUTROL 1 MMOL/ML IV SOLN

[Series 7: T2 · sagittal · 4.0mm · 0.81mm/px · 3 of 16 slices shown (1 of 2)]
[im 1/16]
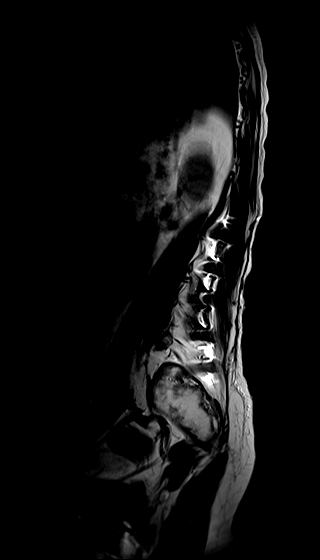
[im 8/16]
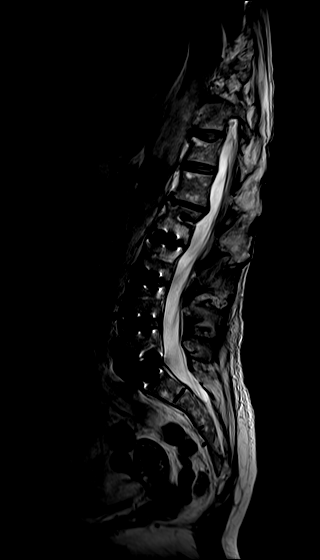
[im 16/16]
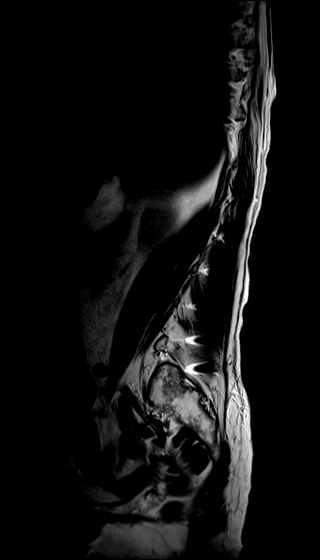

[Series 10: T2 · axial · 4.0mm · 0.70mm/px · z∈[-134,+88]mm · 11 of 41 slices shown (2 of 2)]
[im 1/41]
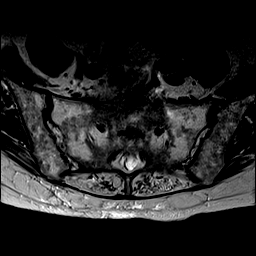
[im 5/41]
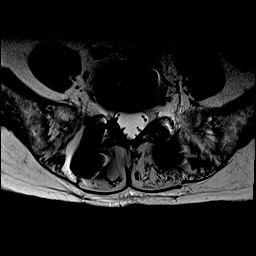
[im 9/41]
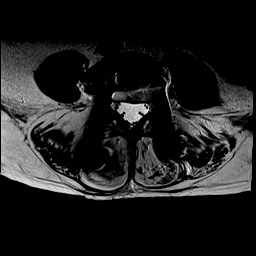
[im 13/41]
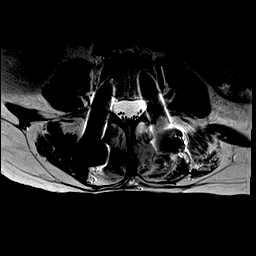
[im 17/41]
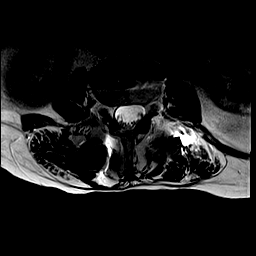
[im 21/41]
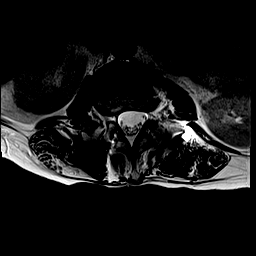
[im 25/41]
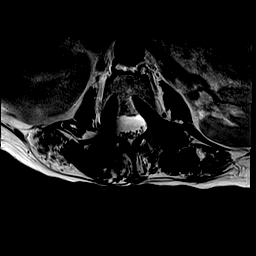
[im 29/41]
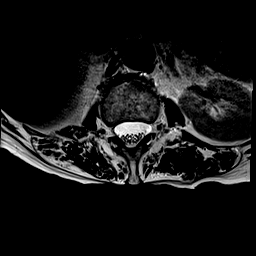
[im 33/41]
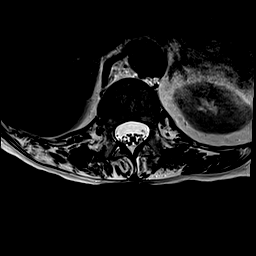
[im 37/41]
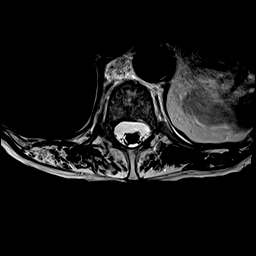
[im 41/41]
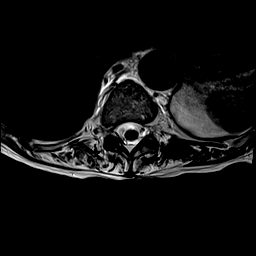

[Series 11: T1 · axial · 4.0mm · 0.35mm/px · z∈[-134,+88]mm · 11 of 41 slices shown (1 of 2)]
[im 1/41]
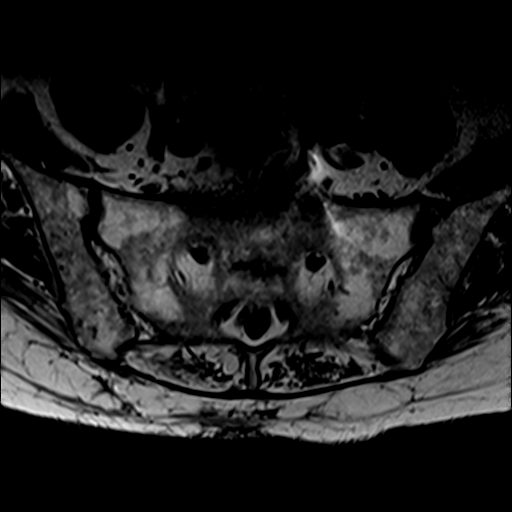
[im 5/41]
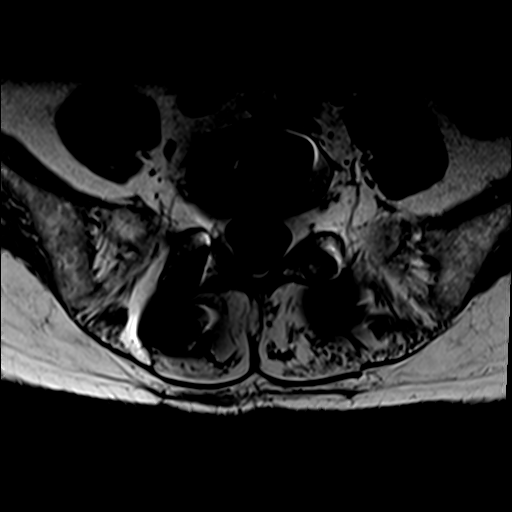
[im 9/41]
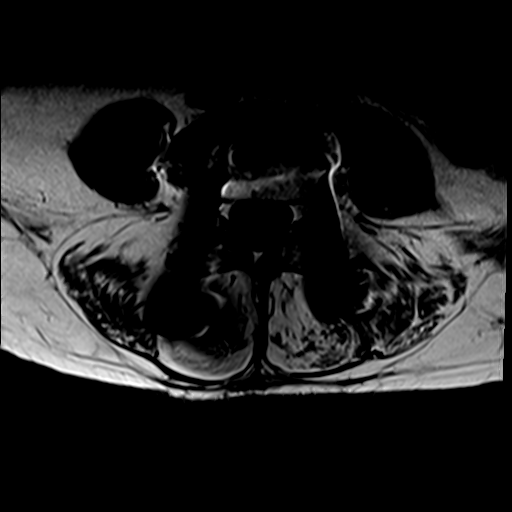
[im 13/41]
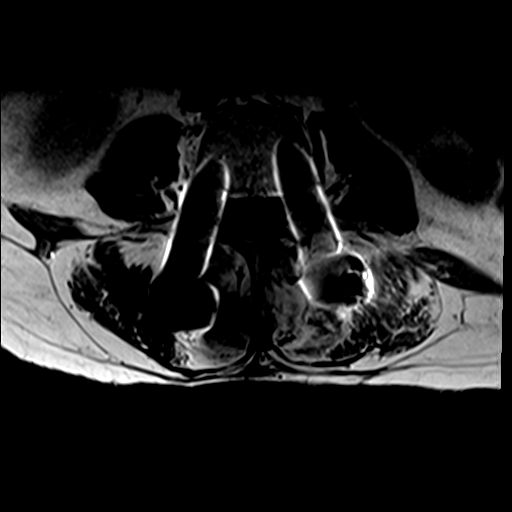
[im 17/41]
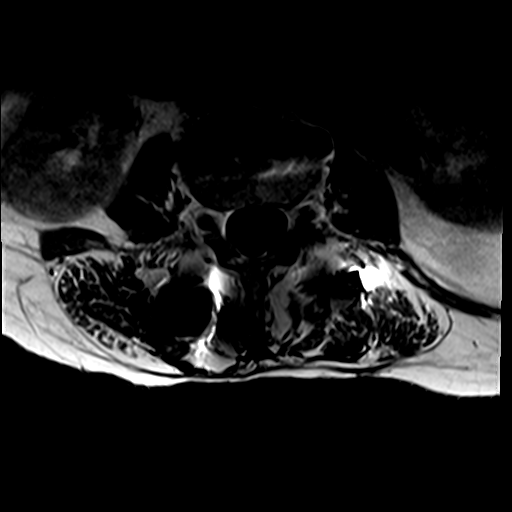
[im 21/41]
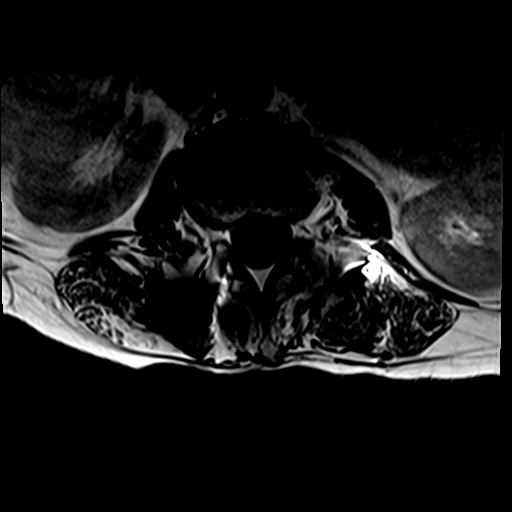
[im 25/41]
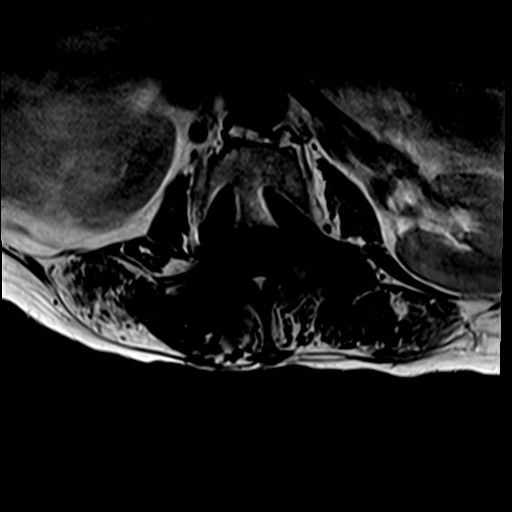
[im 29/41]
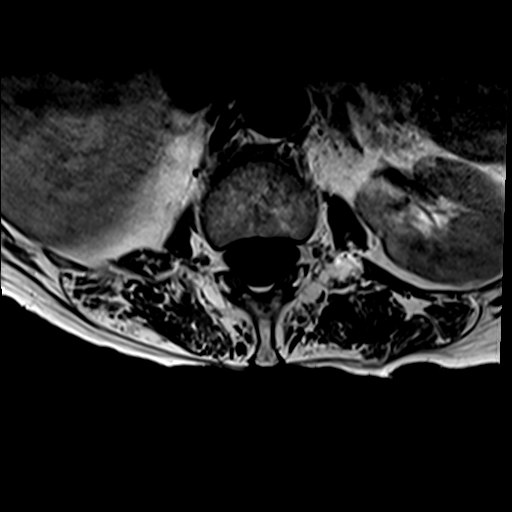
[im 33/41]
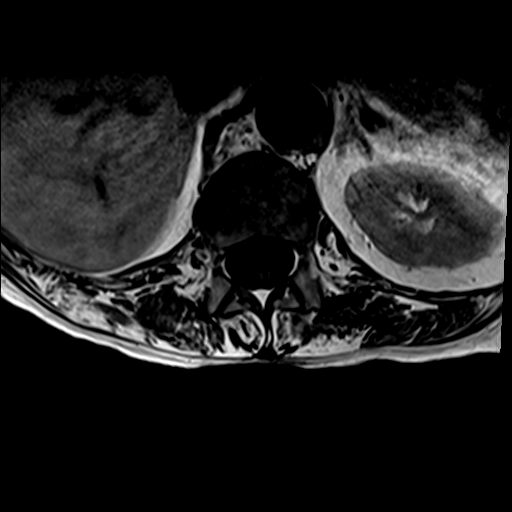
[im 37/41]
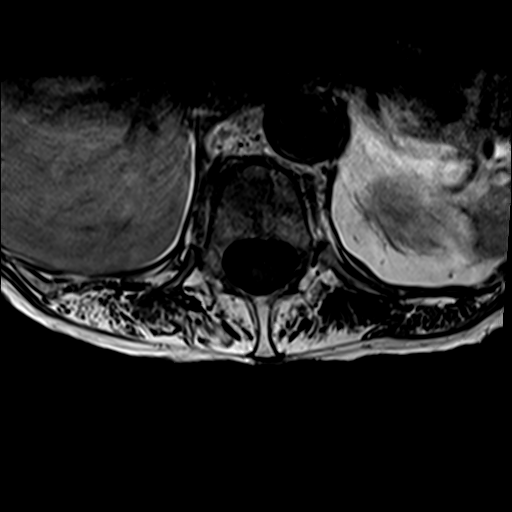
[im 41/41]
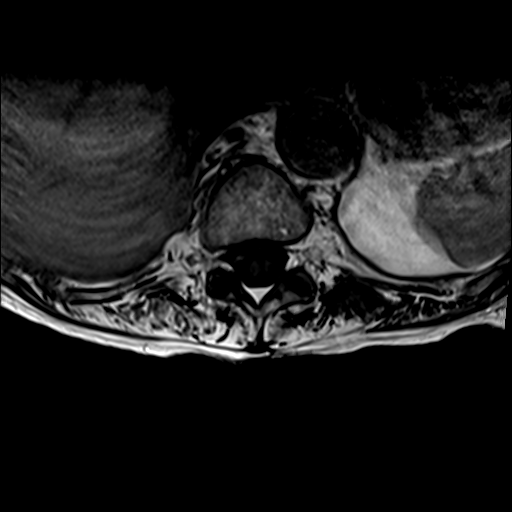

[Series 12: T1 · sagittal · 4.0mm · 1.02mm/px · 4 of 16 slices shown (2 of 2)]
[im 1/16]
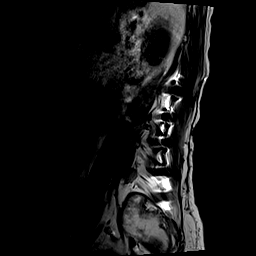
[im 6/16]
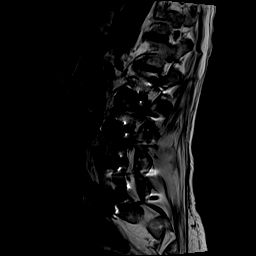
[im 11/16]
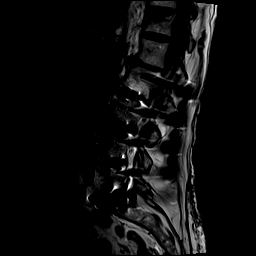
[im 16/16]
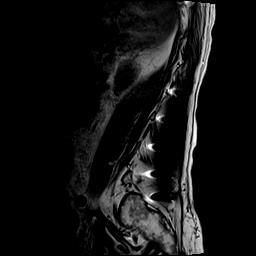

[Series 13: T1 fat-sat post-contrast · sagittal · 4.0mm · 1.02mm/px · 4 of 16 slices shown]
[im 1/16]
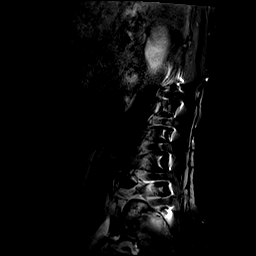
[im 6/16]
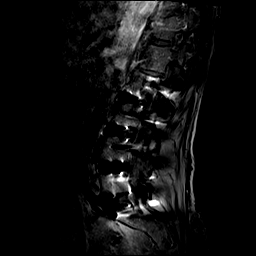
[im 11/16]
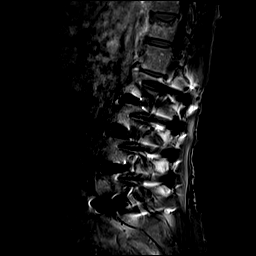
[im 16/16]
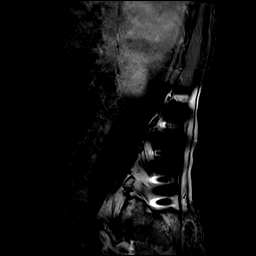

[33 of 48 positions shown; findings below may reference images not displayed]

FINDINGS: Segmentation:  5 lumbar vertebra.

Alignment:  Normal

Vertebrae: Mild superior endplate fracture of T12 with bone marrow
edema. This was not present previously and appears acute or
subacute. No underlying mass lesion identified. No other fracture or
bone lesion in the lumbar spine.

Conus medullaris and cauda equina: Conus extends to the L1 level.
Conus and cauda equina appear normal.

Paraspinal and other soft tissues: Negative for paraspinous mass or
adenopathy.

Disc levels:

T12-L1: Negative

L1-2: Moderate disc degeneration with disc space narrowing and
spurring. No significant stenosis. No interval change.

L2-3: PLIF.  Negative for stenosis

L3-4: PLIF.  Negative for stenosis

L4-5: PLIF.  Negative for stenosis

L5-S1: PLIF.  Negative for stenosis.
IMPRESSION: Mild superior endplate fracture T12 appears recent and benign. No
other fracture

Satisfactory fusion L1 through S1.

Disc degeneration and spurring L1-2 without significant stenosis.

## 2021-07-05 MED ORDER — GADOBUTROL 1 MMOL/ML IV SOLN
7.0000 mL | Freq: Once | INTRAVENOUS | Status: AC | PRN
  Administered 2021-07-05: 08:00:00 7 mL via INTRAVENOUS

## 2021-07-06 ENCOUNTER — Other Ambulatory Visit (HOSPITAL_COMMUNITY): Payer: Self-pay | Admitting: Hematology

## 2021-07-06 LAB — UIFE/LIGHT CHAINS/TP QN, 24-HR UR
FR KAPPA LT CH,24HR: 4.13 mg/24 hr
FR LAMBDA LT CH,24HR: <1.68 mg/24 hr
Free Kappa Lt Chains,Ur: 1.65 mg/L (ref 1.17–86.46)
Free Kappa/Lambda Ratio: >2.46 (ref 1.83–14.26)
Free Lambda Lt Chains,Ur: <0.67 mg/L (ref 0.27–15.21)
Total Protein, Urine-Ur/day: 220 mg/24 hr — ABNORMAL HIGH (ref 30–150)
Total Protein, Urine: 8.8 mg/dL
Total Volume: 2500

## 2021-07-06 MED ORDER — OXYCODONE-ACETAMINOPHEN 5-325 MG PO TABS: 1.0000 | ORAL_TABLET | Freq: Four times a day (QID) | ORAL | 0 refills | Status: DC | PRN

## 2021-07-10 ENCOUNTER — Other Ambulatory Visit: Payer: Self-pay

## 2021-07-10 ENCOUNTER — Ambulatory Visit
Admission: RE | Admit: 2021-07-10 | Discharge: 2021-07-10 | Disposition: A | Discharge status: Home / Self Care | Payer: Medicare Other | Source: Ambulatory Visit | Attending: Family Medicine | Admitting: Family Medicine

## 2021-07-10 DIAGNOSIS — Z203 Contact with and (suspected) exposure to rabies: Secondary | ICD-10-CM | POA: Diagnosis not present

## 2021-07-10 MED ORDER — RABIES VACCINE, PCEC IM SUSR
1.0000 mL | Freq: Once | INTRAMUSCULAR | Status: AC
  Administered 2021-07-10: 14:00:00 1 mL via INTRAMUSCULAR

## 2021-07-10 NOTE — ED Triage Notes (Signed)
Here for 4th rabies vaccine 

## 2021-07-11 ENCOUNTER — Encounter (HOSPITAL_COMMUNITY): Payer: Self-pay | Admitting: Hematology

## 2021-07-16 NOTE — Progress Notes (Signed)
[name], [place] [address]   CLINIC:  Medical Oncology/Hematology  PCP:  Yvone Neu, MD [facility] [place] [address] /* [phone]   REASON FOR VISIT:  Follow-up after MRI and bone marrow biopsy.  PRIOR THERAPY: none  NGS Results: not done  CURRENT THERAPY: under work-up  BRIEF ONCOLOGIC HISTORY:  Oncology History   No history exists.    CANCER STAGING:  Cancer Staging  No matching staging information was found for the patient.  INTERVAL HISTORY:  [name], a 71 y.o. female, returns for routine follow-up of her left rib lesion. Elyzabeth was last seen on 07/02/2021.   Today she reports feeling fair. She reports continued severe lower back pain; the spasms in her back have improved. This pain is currently preventing her from walking, and she spends most of her day in her recliner chair.   REVIEW OF SYSTEMS:  Review of Systems  Constitutional:  Positive for fatigue (depleted). Negative for appetite change (50%).  Respiratory:  Positive for cough and shortness of breath.   Gastrointestinal:  Positive for constipation and nausea.  Musculoskeletal:  Positive for back pain (5/10).  All other systems reviewed and are negative.  PAST MEDICAL/SURGICAL HISTORY:  Past Medical History:  Diagnosis Date   Arthritis    osteoarthritis   Back pain    Bronchiectasis (HCC)    Cancer (HCC)    basal cell nose   Chronic cough    Complication of anesthesia    Dyspnea    Gastrointestinal symptoms    GERD (gastroesophageal reflux disease)    Glaucoma    History of hiatal hernia    Hyperlipemia    Hypertension    Osteopenia    Pneumonia    PONV (postoperative nausea and vomiting)    "only one time"   Spondylisthesis    Past Surgical History:  Procedure Laterality Date   ABDOMINAL EXPOSURE N/A 12/29/2018   Procedure: ABDOMINAL EXPOSURE;  Surgeon: Angelia Mould, MD;   Location: [facility] OR;  Service: Vascular;  Laterality: N/A;   ABDOMINOPLASTY  2002   ABDOMINOPLASTY  1970's   ANKLE SURGERY  10/2015   ANTERIOR LATERAL LUMBAR FUSION WITH PERCUTANEOUS SCREW 3 LEVEL Left 12/29/2018   Procedure: Lumbar Two to Lumbar Five Anterolateral lumbar interbody fusion;  Surgeon: Erline Levine, MD;  Location: [place];  Service: Neurosurgery;  Laterality: Left;  anterolateral   ANTERIOR LUMBAR FUSION N/A 12/29/2018   Procedure: Lumbar Five Sacral One Anterior lumbar interbody fusion;  Surgeon: Erline Levine, MD;  Location: [place];  Service: Neurosurgery;  Laterality: N/A;  anterior approach   BASAL CELL CARCINOMA EXCISION  06/2018   nose   BLEPHAROPLASTY Bilateral 1999   BREAST ENHANCEMENT SURGERY  1970's   COLONOSCOPY     CYSTOURETHROSCOPY  2018   Doble J ureteal stents   DECOMPRESSION CORE HIP Left 2014   FACIAL COSMETIC SURGERY  2004   FLUOROSCOPY GUIDANCE     HIP ARTHROPLASTY Left    INCISIONAL HERNIA REPAIR N/A 10/08/2019   Procedure: OPEN INCISIONAL HERNIA REPAIR WITH MESH;  Surgeon: Armandina Gemma, MD;  Location: [facility];  Service: General;  Laterality: N/A;   JOINT REPLACEMENT Left    05/2014   LAPAROSCOPIC PARTIAL COLECTOMY  06/26/2017   LUMBAR PERCUTANEOUS PEDICLE SCREW 4 LEVEL N/A 12/29/2018   Procedure: Lumbar Percutaneous Pedicle Screw Placement Lumbar two-Sacral one;  Surgeon: Erline Levine, MD;  Location: [place];  Service:  Neurosurgery;  Laterality: N/A;   MOHS SURGERY  2019   nose   PARTIAL COLECTOMY  06/2017   for diverticulitis   REMOVAL OF BILATERAL TISSUE EXPANDERS WITH PLACEMENT OF BILATERAL BREAST IMPLANTS  2004   THIGH LIFT  1994   TOE FUSION Right    great toe   TONSILLECTOMY     UMBILICAL HERNIA REPAIR     with tummy tuck   UMBILICAL HERNIA REPAIR  2002    SOCIAL HISTORY:  Social History   Socioeconomic History   Marital status: Married    Spouse name: Not on file   Number of children: Not on file   Years of education: Not on file   Highest  education level: Not on file  Occupational History   Not on file  Tobacco Use   Smoking status: Never   Smokeless tobacco: Never  Vaping Use   Vaping Use: Never used  Substance and Sexual Activity   Alcohol use: Yes    Comment: occasional   Drug use: Never   Sexual activity: Not on file  Other Topics Concern   Not on file  Social History Narrative   Not on file   Social Determinants of Health   Financial Resource Strain: Not on file  Food Insecurity: Not on file  Transportation Needs: Not on file  Physical Activity: Not on file  Stress: Not on file  Social Connections: Not on file  Intimate Partner Violence: Not on file    FAMILY HISTORY:  Family History  Problem Relation Age of Onset   Hypertension Mother    COPD Mother    Hypertension Father    Heart disease Father     CURRENT MEDICATIONS:  Current Outpatient Medications  Medication Sig Dispense Refill   amLODipine (NORVASC) 5 MG tablet Take 5 mg by mouth daily.     azithromycin (ZITHROMAX) 250 MG tablet Take 250 mg by mouth daily.     Bedaquiline Fumarate (SIRTURO) 100 MG TABS Take 200 mg by mouth every Monday, Wednesday, and Friday.     Biotin 1000 MCG tablet Take 1,000 mcg by mouth daily.     Calcium Carb-Cholecalciferol 600-800 MG-UNIT TABS Take 1 tablet by mouth daily.     dextromethorphan-guaiFENesin (MUCINEX DM) 30-600 MG 12hr tablet Take 1 tablet by mouth daily.      estrogen, conjugated,-medroxyprogesterone (PREMPRO) 0.45-1.5 MG tablet Take 1 tablet by mouth daily.     fluticasone (FLONASE) 50 MCG/ACT nasal spray Place 1 spray into both nostrils daily.     hydrALAZINE (APRESOLINE) 25 MG tablet Take 25 mg by mouth 2 (two) times daily.     hydrochlorothiazide (HYDRODIURIL) 25 MG tablet Take 25 mg by mouth daily.     latanoprost (XALATAN) 0.005 % ophthalmic solution Place 1 drop into both eyes at bedtime.      linaclotide (LINZESS) 145 MCG CAPS capsule Take 145 mcg by mouth daily before breakfast.      lisinopril (ZESTRIL) 20 MG tablet Take 20 mg by mouth daily.     loratadine (CLARITIN) 10 MG tablet Take 10 mg by mouth daily.     omeprazole (PRILOSEC) 20 MG capsule Take 20 mg by mouth daily.     pantoprazole (PROTONIX) 40 MG tablet Take 40 mg by mouth daily.     albuterol (VENTOLIN HFA) 108 (90 Base) MCG/ACT inhaler Inhale 2 puffs into the lungs every 6 (six) hours as needed for wheezing or shortness of breath. (Patient not taking: Reported on 07/17/2021)  benzonatate (TESSALON) 100 MG capsule Take by mouth 3 (three) times daily as needed for cough. (Patient not taking: Reported on 07/17/2021)     methocarbamol (ROBAXIN) 500 MG tablet Take 1 tablet (500 mg total) by mouth every 6 (six) hours as needed for muscle spasms. (Patient not taking: Reported on 07/17/2021) 40 tablet 3   ondansetron (ZOFRAN ODT) 4 MG disintegrating tablet Take 1 tablet (4 mg total) by mouth every 4 (four) hours as needed for nausea or vomiting. (Patient not taking: Reported on 07/17/2021) 20 tablet 0   oxyCODONE-acetaminophen (PERCOCET/ROXICET) 5-325 MG tablet Take 1 tablet by mouth every 6 (six) hours as needed for severe pain. 60 tablet 0   SUMAtriptan (IMITREX) 100 MG tablet Take 100 mg by mouth every 2 (two) hours as needed for migraine. May repeat in 2 hours if headache persists or recurs. (Patient not taking: Reported on 07/17/2021)     No current facility-administered medications for this visit.    ALLERGIES:  Allergies  Allergen Reactions   Tobramycin-Dexamethasone Other (See Comments)    Itching and swelling   Ethambutol Other (See Comments)    Fever   Neomycin-Polymyxin-Dexameth Other (See Comments)    Itching and swelling   Amikacin Other (See Comments)    Eosinophilia with chills and aching when given with cefoxitin   Biaxin [Clarithromycin] Itching   Cefoxitin Other (See Comments)    Eosinophlia when given with amikacin   Sulfamethoxazole-Trimethoprim Itching   Vancomycin    Bacitracin-Polymyxin B Rash     PHYSICAL EXAM:  Performance status (ECOG): 1 - Symptomatic but completely ambulatory  Vitals:   07/17/21 1421  BP: 137/65  Pulse: 78  Resp: 17  Temp: 97.9 F (36.6 C)  SpO2: 94%   Wt Readings from Last 3 Encounters:  07/17/21 134 lb (60.8 kg)  06/26/21 135 lb (61.2 kg)  06/25/21 135 lb (61.2 kg)   Physical Exam Vitals reviewed.  Constitutional:      Appearance: Normal appearance.     Comments: In wheelchair  Cardiovascular:     Rate and Rhythm: Normal rate and regular rhythm.     Pulses: Normal pulses.     Heart sounds: Normal heart sounds.  Pulmonary:     Effort: Pulmonary effort is normal.     Breath sounds: Normal breath sounds.  Neurological:     General: No focal deficit present.     Mental Status: She is alert and oriented to person, place, and time.  Psychiatric:        Mood and Affect: Mood normal.        Behavior: Behavior normal.     LABORATORY DATA:  I have reviewed the labs as listed.  CBC Latest Ref Rng & Units 07/04/2021 06/25/2021 06/13/2021  WBC 4.0 - 10.5 K/uL 6.2 4.3 4.2  Hemoglobin 12.0 - 15.0 g/dL 11.6(L) 13.1 12.2  Hematocrit 36.0 - 46.0 % 34.4(L) 39.6 37.3  Platelets 150 - 400 K/uL 299 433(H) 340   CMP Latest Ref Rng & Units 06/13/2021 10/01/2019 01/19/2019  Glucose 70 - 99 mg/dL 94 81 130(H)  BUN 8 - 23 mg/dL 19 15 7(L)  Creatinine 0.44 - 1.00 mg/dL 0.72 0.64 0.74  Sodium 135 - 145 mmol/L 133(L) 137 128(L)  Potassium 3.5 - 5.1 mmol/L 4.5 4.5 4.1  Chloride 98 - 111 mmol/L 96(L) 99 91(L)  CO2 22 - 32 mmol/L '28 30 23  ' Calcium 8.9 - 10.3 mg/dL 9.6 9.4 9.2  Total Protein 6.5 - 8.1 g/dL 8.8(H) - 7.7  Total Bilirubin 0.3 - 1.2 mg/dL 0.3 - 0.7  Alkaline Phos 38 - 126 U/L 75 - 85  AST 15 - 41 U/L 26 - 19  ALT 0 - 44 U/L 23 - 14    DIAGNOSTIC IMAGING:  I have independently reviewed the scans and discussed with the patient. MR Lumbar Spine W Wo Contrast  Result Date: 07/05/2021 CLINICAL DATA:  Low back pain with bilateral leg pain.  Cancer suspected. History of spinal fusion. EXAM: MRI LUMBAR SPINE WITHOUT AND WITH CONTRAST TECHNIQUE: Multiplanar and multiecho pulse sequences of the lumbar spine were obtained without and with intravenous contrast. CONTRAST:  55m GADAVIST GADOBUTROL 1 MMOL/ML IV SOLN COMPARISON:  Lumbar MRI 09/29/2020. FINDINGS: Segmentation:  5 lumbar vertebra. Alignment:  Normal Vertebrae: Mild superior endplate fracture of TN56with bone marrow edema. This was not present previously and appears acute or subacute. No underlying mass lesion identified. No other fracture or bone lesion in the lumbar spine. Conus medullaris and cauda equina: Conus extends to the L1 level. Conus and cauda equina appear normal. Paraspinal and other soft tissues: Negative for paraspinous mass or adenopathy. Disc levels: T12-L1: Negative L1-2: Moderate disc degeneration with disc space narrowing and spurring. No significant stenosis. No interval change. L2-3: PLIF.  Negative for stenosis L3-4: PLIF.  Negative for stenosis L4-5: PLIF.  Negative for stenosis L5-S1: PLIF.  Negative for stenosis. IMPRESSION: Mild superior endplate fracture TO13appears recent and benign. No other fracture Satisfactory fusion L1 through S1. Disc degeneration and spurring L1-2 without significant stenosis. Electronically Signed   By: CFranchot GalloM.D.   On: 07/05/2021 11:49   CT Biopsy  Result Date: 07/04/2021 CLINICAL DATA:  Plasma cell neoplasm EXAM: CT GUIDED DEEP ILIAC BONE ASPIRATION AND CORE BIOPSY TECHNIQUE: Patient was placed prone on the CT gantry and limited axial scans through the pelvis were obtained. Appropriate skin entry site was identified. Skin site was marked, prepped with chlorhexidine, draped in usual sterile fashion, and infiltrated locally with 1% lidocaine. Intravenous Fentanyl 1030m and Versed 89m789mere administered as conscious sedation during continuous monitoring of the patient's level of consciousness and physiological / cardiorespiratory  status by the radiology RN, with a total moderate sedation time of 24 minutes. Under CT fluoroscopic guidance an 11-gauge Cook trocar bone needle was advanced into the right iliac bone just lateral to the sacroiliac joint. Once needle tip position was confirmed, core and aspiration samples were obtained, submitted to pathology for approval. Patient tolerated procedure well. COMPLICATIONS: COMPLICATIONS none IMPRESSION: 1. Technically successful CT guided right iliac bone core and aspiration biopsy. Electronically Signed   By: D  Lucrezia EuropeD.   On: 07/04/2021 10:33   DG Chest Port 1 View  Result Date: 06/25/2021 CLINICAL DATA:  Status post left seventh rib biopsy EXAM: PORTABLE CHEST 1 VIEW COMPARISON:  06/14/2021 PET-CT FINDINGS: Top-normal heart size. Mildly tortuous atherosclerotic thoracic aorta. Otherwise normal mediastinal contour. No pneumothorax. No pleural effusions. Stable mild blunting of the left costophrenic angle compatible with chronic mild pleural-parenchymal scarring as seen on prior CT images. No pulmonary edema. No acute consolidative airspace disease. Stable lytic mildly expansile posterior left seventh rib lesion. Partially visualized bilateral posterior spinal fusion hardware in the lumbar spine. IMPRESSION: 1. No pneumothorax. No active cardiopulmonary disease. 2. Stable lytic mildly expansile posterior left seventh rib lesion. Electronically Signed   By: JasIlona SorrelD.   On: 06/25/2021 14:25   CT BONE MARROW BIOPSY & ASPIRATION  Result Date: 07/04/2021 CLINICAL DATA:  Plasma cell  neoplasm EXAM: CT GUIDED DEEP ILIAC BONE ASPIRATION AND CORE BIOPSY TECHNIQUE: Patient was placed prone on the CT gantry and limited axial scans through the pelvis were obtained. Appropriate skin entry site was identified. Skin site was marked, prepped with chlorhexidine, draped in usual sterile fashion, and infiltrated locally with 1% lidocaine. Intravenous Fentanyl 134mg and Versed 273mwere  administered as conscious sedation during continuous monitoring of the patient's level of consciousness and physiological / cardiorespiratory status by the radiology RN, with a total moderate sedation time of 24 minutes. Under CT fluoroscopic guidance an 11-gauge Cook trocar bone needle was advanced into the right iliac bone just lateral to the sacroiliac joint. Once needle tip position was confirmed, core and aspiration samples were obtained, submitted to pathology for approval. Patient tolerated procedure well. COMPLICATIONS: COMPLICATIONS none IMPRESSION: 1. Technically successful CT guided right iliac bone core and aspiration biopsy. Electronically Signed   By: D Lucrezia Europe.D.   On: 07/04/2021 10:33   CT BONE TROCAR/NEEDLE BIOPSY DEEP  Result Date: 06/25/2021 INDICATION: [id number] old woman with history of bladder cancer presents to IR for CT guided biopsy of left seventh rib lytic lesion. EXAM: CT-guided biopsy of lytic lesion of left seventh rib MEDICATIONS: None. ANESTHESIA/SEDATION: Moderate (conscious) sedation was employed during this procedure. A total of Versed 3 mg and Fentanyl 100 mcg was administered intravenously. Moderate Sedation Time: 22 minutes. The patient's level of consciousness and vital signs were monitored continuously by radiology nursing throughout the procedure under my direct supervision. COMPLICATIONS: None immediate. PROCEDURE: Informed written consent was obtained from the patient after a thorough discussion of the procedural risks, benefits and alternatives. All questions were addressed. Maximal Sterile Barrier Technique was utilized including caps, mask, sterile gowns, sterile gloves, sterile drape, hand hygiene and skin antiseptic. A timeout was performed prior to the initiation of the procedure. Patient positioned left lateral decubitus on the CT table. The left posterior chest wall skin prepped and draped in usual fashion. Following local administration, introducer needle was  advanced into the posterior aspect of the left seventh rib lesion and 4-18 gauge cores were obtained. Patient tolerated procedure well without complication. IMPRESSION: CT-guided biopsy of left seventh rib lesion as above. Electronically Signed   By: FaMiachel Roux.D.   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 StWilburn Mylar She reported discomfort in the left posterior back for the last 1 month.  She was seen by Dr. MaChauncey Readingnd x-rays showed abnormality.  A CT scan of the chest was done on 05/24/2021. - CT chest report reviewed by me from 05/24/2021 showed expansile oval-shaped lesion within the posterior aspect of the seventh rib with a nodule measuring 2.1 x 1.1 cm.  Adjacent cortex demonstrates area of thinning and destruction.  Stable emphysematous and interstitial changes within the lungs.  Stable pulmonary nodules within the right upper lobe and left upper lobe when compared to CT from September 2020. - CT of the abdomen and pelvis did not show any significant abnormalities. - She denies any fevers, night sweats or weight loss in the last 6 months.  She had history of basal cell carcinoma on the nose removed by Mohs surgery. - She also reported history of osteoporosis and broke left upper rib 1 year ago after sneezing. -Reviewed PET scan from 06/14/2021.  Mild hypermetabolism involving left aspect of the L5 vertebral body and also pedicle region surrounding  the screw.  SUV 4.2.  There appears to be a lytic process on the CT scan.  The lytic left posterior seventh rib lesion is mildly hypermetabolic with SUV 4.09.  No other definite lytic hypermetabolic bone lesions seen. - We have reviewed left seventh rib bone biopsy from 06/25/2021 consistent with plasma cell neoplasm.  Cells are positive for CD138, CD56 and a kappa restricted. - Labs on 06/13/2021 M spike 1.7 g.  Immunofixation IgA kappa.  Free light chain  shows kappa light chain 71, ratio 8.22.  LDH and beta-2 microglobulin was normal. - Bone marrow biopsy on 07/04/2021 consistent with plasma cell neoplasm.  Chromosome analysis was normal.  FISH panel did not show high risk features.    Social/family history: - She is married and lives at home with her husband.  She has not worked but had help her husband and his work.  She is a non-smoker. - Maternal grandmother had colon cancer.  Mother had a squamous cell carcinoma of the skin.   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 - We reviewed chromosome analysis which was normal.  Multiple myeloma FISH panel shows loss of NF1/chromosome 17/17 q.  Gain of CCN D1 or chromosome 11/11 q.  Gain of chromosome 4/4P.  Loss of material from the long-arm of chromosome 13.  These are all standard risk features. - Bone marrow biopsy showed 26% plasma cells on aspirate with atypical features.  Core biopsy showed 50% cellularity.  Plasma cells comprise 50 to 60% of cellular elements-Kappa restricted. - 24-hour urine showed total protein of 240 mg.  Urine immunofixation was negative.  Urine kappa light chains were 4.13. - She does not report any pain at the left posterior seventh rib region at this time.  We will consider radiation therapy if there is any pain in the future. - We will recommend treatment for multiple myeloma in the form of quadruplet therapy based on [facility] protocol.  We will also plan to refer her to a transplant center.  2.  Low back pain: - Continue Percocet 5/325 every 6 hours as needed. - Continue Robaxin every 6 hours as needed. - We reviewed MRI of the lumbar spine results from 07/05/2021 which showed mild superior endplate fracture W11.  No other fractures.  L5 did not show any lesions. - She will follow-up with neurosurgery for the back pain.   Orders placed this encounter:  No orders of  the defined types were placed in this encounter.    Derek Jack, MD [place] [phone]   I, Thana Ates, am acting as a scribe for Dr. Derek Jack.  I, Derek Jack MD, have reviewed the above documentation for accuracy and completeness, and I agree with the above.

## 2021-07-17 ENCOUNTER — Encounter (HOSPITAL_COMMUNITY): Payer: Self-pay | Admitting: Hematology

## 2021-07-17 ENCOUNTER — Other Ambulatory Visit: Payer: Self-pay

## 2021-07-17 ENCOUNTER — Inpatient Hospital Stay (HOSPITAL_COMMUNITY): Payer: Medicare Other | Attending: Hematology | Admitting: Hematology

## 2021-07-17 ENCOUNTER — Other Ambulatory Visit (HOSPITAL_COMMUNITY): Payer: Self-pay

## 2021-07-17 DIAGNOSIS — Z8651 Personal history of combat and operational stress reaction: Secondary | ICD-10-CM | POA: Diagnosis not present

## 2021-07-17 DIAGNOSIS — R0602 Shortness of breath: Secondary | ICD-10-CM | POA: Diagnosis not present

## 2021-07-17 DIAGNOSIS — Z85828 Personal history of other malignant neoplasm of skin: Secondary | ICD-10-CM | POA: Diagnosis not present

## 2021-07-17 DIAGNOSIS — R059 Cough, unspecified: Secondary | ICD-10-CM | POA: Insufficient documentation

## 2021-07-17 DIAGNOSIS — R5383 Other fatigue: Secondary | ICD-10-CM | POA: Insufficient documentation

## 2021-07-17 DIAGNOSIS — K5900 Constipation, unspecified: Secondary | ICD-10-CM | POA: Diagnosis not present

## 2021-07-17 DIAGNOSIS — R110 Nausea: Secondary | ICD-10-CM | POA: Insufficient documentation

## 2021-07-17 DIAGNOSIS — Z840 Family history of diseases of the skin and subcutaneous tissue: Secondary | ICD-10-CM | POA: Insufficient documentation

## 2021-07-17 DIAGNOSIS — Z800 Family history of malignant neoplasm of digestive organs: Secondary | ICD-10-CM | POA: Diagnosis not present

## 2021-07-17 DIAGNOSIS — C9000 Multiple myeloma not having achieved remission: Secondary | ICD-10-CM

## 2021-07-17 DIAGNOSIS — M545 Low back pain, unspecified: Secondary | ICD-10-CM | POA: Diagnosis not present

## 2021-07-17 MED ORDER — ONDANSETRON 4 MG PO TBDP: 4.0000 mg | ORAL_TABLET | ORAL | 6 refills | Status: DC | PRN

## 2021-07-17 MED ORDER — OXYCODONE-ACETAMINOPHEN 5-325 MG PO TABS: 1.0000 | ORAL_TABLET | Freq: Four times a day (QID) | ORAL | 0 refills | Status: DC | PRN

## 2021-07-17 NOTE — Patient Instructions (Signed)
[facility] °Discharge Instructions ° °You were seen and examined today by Dr. Katragadda. He reviewed your most recent scans and the results of the FISH and Chromosomes from the Bone marrow. He will call you with the results of the bone marrow biopsy. He recommends radiation of the area on your rib pending bone marrow results. Please keep follow up appointment as scheduled. ° ° °Thank you for choosing [facility] to provide your oncology and hematology care.  To afford each patient quality time with our provider, please arrive at least 15 minutes before your scheduled appointment time.  ° °If you have a lab appointment with the Cancer Center please come in thru the Main Entrance and check in at the main information desk. ° °You need to re-schedule your appointment should you arrive 10 or more minutes late.  We strive to give you quality time with our providers, and arriving late affects you and other patients whose appointments are after yours.  Also, if you no show three or more times for appointments you may be dismissed from the clinic at the providers discretion.     °Again, thank you for choosing [facility].  Our hope is that these requests will decrease the amount of time that you wait before being seen by our physicians.       °_____________________________________________________________ ° °Should you have questions after your visit to [facility], please contact our office at [phone] and follow the prompts.  Our office hours are 8:00 a.m. and 4:30 p.m. Monday - Friday.  Please note that voicemails left after 4:00 p.m. may not be returned until the following business day.  We are closed weekends and major holidays.  You do have access to a nurse 24-7, just call the main number to the clinic [phone] and do not press any options, hold on the line and a nurse will answer the phone.   ° °For  prescription refill requests, have your pharmacy contact our office and allow 72 hours.   ° °Due to Covid, you will need to wear a mask upon entering the hospital. If you do not have a mask, a mask will be given to you at the Main Entrance upon arrival. For doctor visits, patients may have 1 support person age 18 or older with them. For treatment visits, patients can not have anyone with them due to social distancing guidelines and our immunocompromised population.  ° °  °

## 2021-07-18 NOTE — Progress Notes (Signed)
Amanda Conner, Amanda Conner [id number]   CLINIC:  Medical Oncology/Hematology  PCP:  Yvone Neu, MD [facility] [place] [address] /* [phone]   REASON FOR VISIT:  Follow-up for multiple myeloma  PRIOR THERAPY: none  NGS Results: not done  CURRENT THERAPY: DaraVRd (Daratumumab SQ) q21d x 6 Cycles (Induction/Consolidation)  BRIEF ONCOLOGIC HISTORY:  Oncology History  Multiple myeloma ([place])  07/17/2021 Initial Diagnosis   Multiple myeloma ([place])   07/23/2021 -  Chemotherapy   Patient is on Treatment Plan : MYELOMA NEWLY DIAGNOSED TRANSPLANT CANDIDATE DaraVRd (Daratumumab SQ) q21d x 6 Cycles (Induction/Consolidation)       CANCER STAGING:  Cancer Staging  Multiple myeloma ([place]) Staging form: Multiple Myeloma, AJCC 6th Edition - Clinical stage from 07/17/2021: Stage IA - Unsigned   INTERVAL HISTORY:  Amanda Conner, a 71 y.o. female, returns for routine follow-up of her multiple myeloma. Amanda Conner was last seen on 07/02/2021.   Today she reports feeling fair, and she is accompanied by her husband. She reports continued lower back pain. She stopped taking Sirturo 1 month ago.   REVIEW OF SYSTEMS:  Review of Systems  Constitutional:  Positive for appetite change and fatigue.  Gastrointestinal:  Positive for constipation and nausea.  Musculoskeletal:  Positive for back pain (lower 4/10).  Neurological:  Positive for headaches.  All other systems reviewed and are negative.  PAST MEDICAL/SURGICAL HISTORY:  Past Medical History:  Diagnosis Date   Arthritis    osteoarthritis   Back pain    Bronchiectasis (HCC)    Cancer (HCC)    basal cell nose   Chronic cough    Complication of anesthesia    Dyspnea    Gastrointestinal symptoms    GERD (gastroesophageal reflux disease)    Glaucoma    History of hiatal hernia    Hyperlipemia    Hypertension    Osteopenia    Pneumonia    PONV  (postoperative nausea and vomiting)    "only one time"   Spondylisthesis    Past Surgical History:  Procedure Laterality Date   ABDOMINAL EXPOSURE N/A 12/29/2018   Procedure: ABDOMINAL EXPOSURE;  Surgeon: Angelia Mould, MD;  Location: [facility] OR;  Service: Vascular;  Laterality: N/A;   ABDOMINOPLASTY  2002   ABDOMINOPLASTY  1970's   ANKLE SURGERY  10/2015   ANTERIOR LATERAL LUMBAR FUSION WITH PERCUTANEOUS SCREW 3 LEVEL Left 12/29/2018   Procedure: Lumbar Two to Lumbar Five Anterolateral lumbar interbody fusion;  Surgeon: Erline Levine, MD;  Location: [place];  Service: Neurosurgery;  Laterality: Left;  anterolateral   ANTERIOR LUMBAR FUSION N/A 12/29/2018   Procedure: Lumbar Five Sacral One Anterior lumbar interbody fusion;  Surgeon: Erline Levine, MD;  Location: [place];  Service: Neurosurgery;  Laterality: N/A;  anterior approach   BASAL CELL CARCINOMA EXCISION  06/2018   nose   BLEPHAROPLASTY Bilateral 1999   BREAST ENHANCEMENT SURGERY  1970's   COLONOSCOPY     CYSTOURETHROSCOPY  2018   [place] J ureteal stents   DECOMPRESSION CORE HIP Left 2014   FACIAL COSMETIC SURGERY  2004   FLUOROSCOPY GUIDANCE     HIP ARTHROPLASTY Left    INCISIONAL HERNIA REPAIR N/A 10/08/2019   Procedure: OPEN INCISIONAL HERNIA REPAIR WITH MESH;  Surgeon: Armandina Gemma, MD;  Location: [facility] ORS;  Service: General;  Laterality: N/A;   JOINT REPLACEMENT Left    05/2014   LAPAROSCOPIC PARTIAL COLECTOMY  06/26/2017  LUMBAR PERCUTANEOUS PEDICLE SCREW 4 LEVEL N/A 12/29/2018   Procedure: Lumbar Percutaneous Pedicle Screw Placement Lumbar two-Sacral one;  Surgeon: Erline Levine, MD;  Location: [place];  Service: Neurosurgery;  Laterality: N/A;   MOHS SURGERY  2019   nose   PARTIAL COLECTOMY  06/2017   for diverticulitis   REMOVAL OF BILATERAL TISSUE EXPANDERS WITH PLACEMENT OF BILATERAL BREAST IMPLANTS  2004   THIGH LIFT  1994   TOE FUSION Right    great toe   TONSILLECTOMY     UMBILICAL HERNIA REPAIR     with tummy  tuck   UMBILICAL HERNIA REPAIR  2002    SOCIAL HISTORY:  Social History   Socioeconomic History   Marital status: Married    Spouse name: Not on file   Number of children: Not on file   Years of education: Not on file   Highest education level: Not on file  Occupational History   Not on file  Tobacco Use   Smoking status: Never   Smokeless tobacco: Never  Vaping Use   Vaping Use: Never used  Substance and Sexual Activity   Alcohol use: Yes    Comment: occasional   Drug use: Never   Sexual activity: Not on file  Other Topics Concern   Not on file  Social History Narrative   Not on file   Social Determinants of Health   Financial Resource Strain: Not on file  Food Insecurity: Not on file  Transportation Needs: Not on file  Physical Activity: Not on file  Stress: Not on file  Social Connections: Not on file  Intimate Partner Violence: Not on file    FAMILY HISTORY:  Family History  Problem Relation Age of Onset   Hypertension Mother    COPD Mother    Hypertension Father    Heart disease Father     CURRENT MEDICATIONS:  Current Outpatient Medications  Medication Sig Dispense Refill   amLODipine (NORVASC) 5 MG tablet Take 5 mg by mouth daily.     azithromycin (ZITHROMAX) 250 MG tablet Take 250 mg by mouth daily.     Bedaquiline Fumarate (SIRTURO) 100 MG TABS Take 200 mg by mouth every Monday, Wednesday, and Friday.     Biotin 1000 MCG tablet Take 1,000 mcg by mouth daily.     Calcium Carb-Cholecalciferol 600-800 MG-UNIT TABS Take 1 tablet by mouth daily.     dextromethorphan-guaiFENesin (MUCINEX DM) 30-600 MG 12hr tablet Take 1 tablet by mouth daily.      estrogen, conjugated,-medroxyprogesterone (PREMPRO) 0.45-1.5 MG tablet Take 1 tablet by mouth daily.     fluticasone (FLONASE) 50 MCG/ACT nasal spray Place 1 spray into both nostrils daily.     hydrALAZINE (APRESOLINE) 25 MG tablet Take 25 mg by mouth 2 (two) times daily.     hydrochlorothiazide (HYDRODIURIL)  25 MG tablet Take 25 mg by mouth daily.     latanoprost (XALATAN) 0.005 % ophthalmic solution Place 1 drop into both eyes at bedtime.      linaclotide (LINZESS) 145 MCG CAPS capsule Take 145 mcg by mouth daily before breakfast.     lisinopril (ZESTRIL) 20 MG tablet Take 20 mg by mouth daily.     loratadine (CLARITIN) 10 MG tablet Take 10 mg by mouth daily.     omeprazole (PRILOSEC) 20 MG capsule Take 20 mg by mouth daily.     pantoprazole (PROTONIX) 40 MG tablet Take 40 mg by mouth daily.     albuterol (VENTOLIN HFA) 108 (90  Base) MCG/ACT inhaler Inhale 2 puffs into the lungs every 6 (six) hours as needed for wheezing or shortness of breath. (Patient not taking: Reported on 07/19/2021)     benzonatate (TESSALON) 100 MG capsule Take by mouth 3 (three) times daily as needed for cough. (Patient not taking: Reported on 07/19/2021)     methocarbamol (ROBAXIN) 500 MG tablet Take 1 tablet (500 mg total) by mouth every 6 (six) hours as needed for muscle spasms. (Patient not taking: Reported on 07/19/2021) 40 tablet 3   ondansetron (ZOFRAN ODT) 4 MG disintegrating tablet Take 1 tablet (4 mg total) by mouth every 4 (four) hours as needed for nausea or vomiting. (Patient not taking: Reported on 07/19/2021) 20 tablet 6   oxyCODONE-acetaminophen (PERCOCET/ROXICET) 5-325 MG tablet Take 1 tablet by mouth every 6 (six) hours as needed for severe pain. (Patient not taking: Reported on 07/19/2021) 60 tablet 0   SUMAtriptan (IMITREX) 100 MG tablet Take 100 mg by mouth every 2 (two) hours as needed for migraine. May repeat in 2 hours if headache persists or recurs. (Patient not taking: Reported on 07/19/2021)     No current facility-administered medications for this visit.    ALLERGIES:  Allergies  Allergen Reactions   Tobramycin-Dexamethasone Other (See Comments)    Itching and swelling   Ethambutol Other (See Comments)    Fever   Neomycin-Polymyxin-Dexameth Other (See Comments)    Itching and swelling   Amikacin Other  (See Comments)    Eosinophilia with chills and aching when given with cefoxitin   Biaxin [Clarithromycin] Itching   Cefoxitin Other (See Comments)    Eosinophlia when given with amikacin   Sulfamethoxazole-Trimethoprim Itching   Vancomycin    Bacitracin-Polymyxin B Rash    PHYSICAL EXAM:  Performance status (ECOG): 1 - Symptomatic but completely ambulatory  Vitals:   07/19/21 1434  BP: 121/66  Pulse: 81  Resp: 18  Temp: 98.3 F (36.8 C)  SpO2: 97%   Wt Readings from Last 3 Encounters:  07/19/21 134 lb (60.8 kg)  07/17/21 134 lb (60.8 kg)  06/26/21 135 lb (61.2 kg)   Physical Exam Vitals reviewed.  Constitutional:      Appearance: Normal appearance.     Comments: In wheelchair  Cardiovascular:     Rate and Rhythm: Normal rate and regular rhythm.     Pulses: Normal pulses.     Heart sounds: Normal heart sounds.  Pulmonary:     Effort: Pulmonary effort is normal.     Breath sounds: Normal breath sounds.  Neurological:     General: No focal deficit present.     Mental Status: She is alert and oriented to person, place, and time.  Psychiatric:        Mood and Affect: Mood normal.        Behavior: Behavior normal.     LABORATORY DATA:  I have reviewed the labs as listed.  CBC Latest Ref Rng & Units 07/04/2021 06/25/2021 06/13/2021  WBC 4.0 - 10.5 K/uL 6.2 4.3 4.2  Hemoglobin 12.0 - 15.0 g/dL 11.6(L) 13.1 12.2  Hematocrit 36.0 - 46.0 % 34.4(L) 39.6 37.3  Platelets 150 - 400 K/uL 299 433(H) 340   CMP Latest Ref Rng & Units 06/13/2021 10/01/2019 01/19/2019  Glucose 70 - 99 mg/dL 94 81 130(H)  BUN 8 - 23 mg/dL 19 15 7(L)  Creatinine 0.44 - 1.00 mg/dL 0.72 0.64 0.74  Sodium 135 - 145 mmol/L 133(L) 137 128(L)  Potassium 3.5 - 5.1 mmol/L 4.5 4.5 4.1  Chloride 98 - 111 mmol/L 96(L) 99 91(L)  CO2 22 - 32 mmol/L _0 Calcium 8.9 - 10.3 mg/dL 9.6 9.4 9.2  Total Protein 6.5 - 8.1 g/dL 8.8(H) - 7.7  Total Bilirubin 0.3 - 1.2 mg/dL 0.3 - 0.7  Alkaline Phos 38 - 126 U/L  75 - 85  AST 15 - 41 U/L 26 - 19  ALT 0 - 44 U/L 23 - 14    DIAGNOSTIC IMAGING:  I have independently reviewed the scans and discussed with the patient. MR Lumbar Spine W Wo Contrast  Result Date: 07/05/2021 CLINICAL DATA:  Low back pain with bilateral leg pain. Cancer suspected. History of spinal fusion. EXAM: MRI LUMBAR SPINE WITHOUT AND WITH CONTRAST TECHNIQUE: Multiplanar and multiecho pulse sequences of the lumbar spine were obtained without and with intravenous contrast. CONTRAST:  61m GADAVIST GADOBUTROL 1 MMOL/ML IV SOLN COMPARISON:  Lumbar MRI 09/29/2020. FINDINGS: Segmentation:  5 lumbar vertebra. Alignment:  Normal Vertebrae: Mild superior endplate fracture of TE67with bone marrow edema. This was not present previously and appears acute or subacute. No underlying mass lesion identified. No other fracture or bone lesion in the lumbar spine. Conus medullaris and cauda equina: Conus extends to the L1 level. Conus and cauda equina appear normal. Paraspinal and other soft tissues: Negative for paraspinous mass or adenopathy. Disc levels: T12-L1: Negative L1-2: Moderate disc degeneration with disc space narrowing and spurring. No significant stenosis. No interval change. L2-3: PLIF.  Negative for stenosis L3-4: PLIF.  Negative for stenosis L4-5: PLIF.  Negative for stenosis L5-S1: PLIF.  Negative for stenosis. IMPRESSION: Mild superior endplate fracture TJ44appears recent and benign. No other fracture Satisfactory fusion L1 through S1. Disc degeneration and spurring L1-2 without significant stenosis. Electronically Signed   By: CFranchot GalloM.D.   On: 07/05/2021 11:49   CT Biopsy  Result Date: 07/04/2021 CLINICAL DATA:  Plasma cell neoplasm EXAM: CT GUIDED DEEP ILIAC BONE ASPIRATION AND CORE BIOPSY TECHNIQUE: Patient was placed prone on the CT gantry and limited axial scans through the pelvis were obtained. Appropriate skin entry site was identified. Skin site was marked, prepped with  chlorhexidine, draped in usual sterile fashion, and infiltrated locally with 1% lidocaine. Intravenous Fentanyl 1029m and Versed 36m57mere administered as conscious sedation during continuous monitoring of the patient's level of consciousness and physiological / cardiorespiratory status by the radiology RN, with a total moderate sedation time of 24 minutes. Under CT fluoroscopic guidance an 11-gauge Cook trocar bone needle was advanced into the right iliac bone just lateral to the sacroiliac joint. Once needle tip position was confirmed, core and aspiration samples were obtained, submitted to pathology for approval. Patient tolerated procedure well. COMPLICATIONS: COMPLICATIONS none IMPRESSION: 1. Technically successful CT guided right iliac bone core and aspiration biopsy. Electronically Signed   By: D  Lucrezia EuropeD.   On: 07/04/2021 10:33   DG Chest Port 1 View  Result Date: 06/25/2021 CLINICAL DATA:  Status post left seventh rib biopsy EXAM: PORTABLE CHEST 1 VIEW COMPARISON:  06/14/2021 PET-CT FINDINGS: Top-normal heart size. Mildly tortuous atherosclerotic thoracic aorta. Otherwise normal mediastinal contour. No pneumothorax. No pleural effusions. Stable mild blunting of the left costophrenic angle compatible with chronic mild pleural-parenchymal scarring as seen on prior CT images. No pulmonary edema. No acute consolidative airspace disease. Stable lytic mildly expansile posterior left seventh rib lesion. Partially visualized bilateral posterior spinal fusion hardware in the lumbar spine. IMPRESSION: 1. No pneumothorax. No active cardiopulmonary disease. 2. Stable lytic mildly  expansile posterior left seventh rib lesion. Electronically Signed   By: Ilona Sorrel M.D.   On: 06/25/2021 14:25   CT BONE MARROW BIOPSY & ASPIRATION  Result Date: 07/04/2021 CLINICAL DATA:  Plasma cell neoplasm EXAM: CT GUIDED DEEP ILIAC BONE ASPIRATION AND CORE BIOPSY TECHNIQUE: Patient was placed prone on the CT gantry and  limited axial scans through the pelvis were obtained. Appropriate skin entry site was identified. Skin site was marked, prepped with chlorhexidine, draped in usual sterile fashion, and infiltrated locally with 1% lidocaine. Intravenous Fentanyl 185mg and Versed 224mwere administered as conscious sedation during continuous monitoring of the patient's level of consciousness and physiological / cardiorespiratory status by the radiology RN, with a total moderate sedation time of 24 minutes. Under CT fluoroscopic guidance an 11-gauge Cook trocar bone needle was advanced into the right iliac bone just lateral to the sacroiliac joint. Once needle tip position was confirmed, core and aspiration samples were obtained, submitted to pathology for approval. Patient tolerated procedure well. COMPLICATIONS: COMPLICATIONS none IMPRESSION: 1. Technically successful CT guided right iliac bone core and aspiration biopsy. Electronically Signed   By: D Lucrezia Europe.D.   On: 07/04/2021 10:33   CT BONE TROCAR/NEEDLE BIOPSY DEEP  Result Date: 06/25/2021 INDICATION: 89ear old woman with history of bladder cancer presents to IR for CT guided biopsy of left seventh rib lytic lesion. EXAM: CT-guided biopsy of lytic lesion of left seventh rib MEDICATIONS: None. ANESTHESIA/SEDATION: Moderate (conscious) sedation was employed during this procedure. A total of Versed 3 mg and Fentanyl 100 mcg was administered intravenously. Moderate Sedation Time: 22 minutes. The patient's level of consciousness and vital signs were monitored continuously by radiology nursing throughout the procedure under my direct supervision. COMPLICATIONS: None immediate. PROCEDURE: Informed written consent was obtained from the patient after a thorough discussion of the procedural risks, benefits and alternatives. All questions were addressed. Maximal Sterile Barrier Technique was utilized including caps, mask, sterile gowns, sterile gloves, sterile drape, hand hygiene  and skin antiseptic. A timeout was performed prior to the initiation of the procedure. Patient positioned left lateral decubitus on the CT table. The left posterior chest wall skin prepped and draped in usual fashion. Following local administration, introducer needle was advanced into the posterior aspect of the left seventh rib lesion and 4-18 gauge cores were obtained. Patient tolerated procedure well without complication. IMPRESSION: CT-guided biopsy of left seventh rib lesion as above. Electronically Signed   By: FaMiachel Roux.D.   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 StWilburn Mylar She reported discomfort in the left posterior back for the last 1 month.  She was seen by Dr. MaChauncey Readingnd x-rays showed abnormality.  A CT scan of the chest was done on 05/24/2021. - CT chest report reviewed by me from 05/24/2021 showed expansile oval-shaped lesion within the posterior aspect of the seventh rib with a nodule measuring 2.1 x 1.1 cm.  Adjacent cortex demonstrates area of thinning and destruction.  Stable emphysematous and interstitial changes within the lungs.  Stable pulmonary nodules within the right upper lobe and left upper lobe when compared to CT from September 2020. - CT of the abdomen and pelvis did not show any significant abnormalities. - She denies any fevers, night sweats or weight loss in the last 6 months.  She had history of basal cell carcinoma on the nose removed by Mohs surgery. -  She also reported history of osteoporosis and broke left upper rib 1 year ago after sneezing. -Reviewed PET scan from 06/14/2021.  Mild hypermetabolism involving left aspect of the L5 vertebral body and also pedicle region surrounding the screw.  SUV 4.2.  There appears to be a lytic process on the CT scan.  The lytic left posterior seventh rib lesion is mildly hypermetabolic with SUV 7.01.  No other definite lytic  hypermetabolic bone lesions seen. - We have reviewed left seventh rib bone biopsy from 06/25/2021 consistent with plasma cell neoplasm.  Cells are positive for CD138, CD56 and a kappa restricted.  - Labs on 06/13/2021 M spike 1.7 g.  Immunofixation IgA kappa.  Free light chain shows kappa light chain 71, ratio 8.22.  LDH and beta-2 microglobulin was normal. - Bone marrow biopsy on 07/04/2021 consistent with plasma cell neoplasm with 26% plasma cells on aspirate with atypical features and 50 to 60% on core biopsy.  Chromosome analysis was normal.  Multiple myeloma FISH panel shows loss of NF1/chromosome 17/17 q.  Gain of CCN D1 or chromosome 11/11 q.  Gain of chromosome 4/4P.  Loss of material from the long-arm of chromosome 13.  These are all standard risk features. - 24-hour urine total protein to 40 mg.  Urine immunofixation was negative.  Urine kappa light chains were 4.13.  Social/family history: - She is married and lives at home with her husband.  She has not worked but had help her husband and his work.  She is a non-smoker. - Maternal grandmother had colon cancer.  Mother had a squamous cell carcinoma of the skin.   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  We also reviewed findings on chromosome analysis and FISH panel. - We talked about management of her multiple myeloma, which is not curable at this time. - Recommend induction therapy with a 4 drug regimen based on [facility] protocol because of better results. - We discussed the regimen and side effects in detail. - We will use dexamethasone at low-dose of 10 mg weekly due to her history of atypical mycobacterial infection of the lung. - Tentatively will start her on therapy next week.  I plan to see her back on day 15 of treatment. - I will likely start her on Revlimid 20 mg 2 weeks on/1 week of and increase it to 25 mg if she tolerates well.  2.   Low back pain: -MRI of the lumbar spine from 07/05/2021 showed mild superior endplate fracture of X79. - Continue Percocet 5/325 every 6 hours as needed and Robaxin every 6 hours as needed.  3.  Atypical mycobacterial infection of left lung: - She had history of pulmonary Mycobacterium abscessus infection and follows with Dr. Lorenda Cahill at [facility]. - She is on azithromycin and bedaquiline. - We will reach out to Dr. Lorenda Cahill due to her new diagnosis of multiple myeloma and the need for treatment and related immunosuppression.  4.  Myeloma bone disease: - We have also talked about bone strengthening agents denosumab/zoledronic acid to decrease skeletal related events. - We discussed side effects in detail.  She will have clearance from dentist prior to start of treatment.  She will take calcium and vitamin D supplements.  5.  ID prophylaxis: - She will start acyclovir 400 mg twice daily for shingles prophylaxis. - She will also start 81 mg aspirin daily for thromboprophylaxis.   Orders placed  this encounter:  No orders of the defined types were placed in this encounter.    Derek Jack, MD [place] [phone]   I, Thana Ates, am acting as a scribe for Dr. Derek Jack.  I, Derek Jack MD, have reviewed the above documentation for accuracy and completeness, and I agree with the above.

## 2021-07-19 ENCOUNTER — Inpatient Hospital Stay (HOSPITAL_BASED_OUTPATIENT_CLINIC_OR_DEPARTMENT_OTHER): Payer: Medicare Other | Admitting: Hematology

## 2021-07-19 ENCOUNTER — Other Ambulatory Visit: Payer: Self-pay

## 2021-07-19 ENCOUNTER — Other Ambulatory Visit (HOSPITAL_COMMUNITY): Payer: Self-pay | Admitting: Hematology

## 2021-07-19 VITALS — BP 121/66 | HR 81 | Temp 98.3°F | Resp 18 | Ht 64.0 in | Wt 134.0 lb

## 2021-07-19 DIAGNOSIS — M899 Disorder of bone, unspecified: Secondary | ICD-10-CM | POA: Diagnosis not present

## 2021-07-19 DIAGNOSIS — C9000 Multiple myeloma not having achieved remission: Secondary | ICD-10-CM

## 2021-07-19 MED ORDER — DEXAMETHASONE 2 MG PO TABS: 10.0000 mg | ORAL_TABLET | ORAL | 4 refills | Status: DC

## 2021-07-19 MED ORDER — ACYCLOVIR 400 MG PO TABS: 400.0000 mg | ORAL_TABLET | Freq: Two times a day (BID) | ORAL | 4 refills | Status: DC

## 2021-07-19 NOTE — Patient Instructions (Signed)
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  You will be treated weekly in the clinic with a combination of drugs, two of which you will receive at the clinic, and two of which you will take at home.  Those drugs are daratumumab (Darzalex), bortezomib (Velcade), lenolinamide (Revlimid), and dexamethasone.  You will receive a total of four cycles (12 weeks) of treatment.  We will then refer you to [facility] to assess your eligibility for bone marrow transplant.  The intent of treatment is to control this disease, prevent it from worsening, and to alleviate any symptoms you may be having related to this disease.  You will see the doctor regularly throughout treatment.  We will obtain blood work from you prior to every treatment and monitor your results to make sure it is safe to give your treatment. The doctor monitors your response to treatment by the way you are feeling, your blood work, and by obtaining scans periodically.  There will be wait times while you are here for treatment.  It will take about 30 minutes to 1 hour for your lab work to result.  Then there will be wait times while pharmacy mixes your medications.    Daratumumab (Darzalex Faspro)  About This Drug Daratumumab is used to treat cancer. It is given under the skin in your abdomen (subcutaneously).  Possible Side Effects   You may have a reaction to the drug. Sometimes you may be given medication to stop or lessen these side effects. Your nurse will check you closely for these signs: fever or shaking chills, flushing, facial swelling, feeling dizzy, headache, trouble breathing, rash, itching, chest tightness, or chest pain. These reactions may happen after your infusion. If this happens, call 911 for emergency care.   Decrease in the number of white blood cells and platelets. This may raise your risk of infection, and raise your risk of bleeding.   Fever and chills    Tiredness   Feeling dizzy   Trouble sleeping   Cough and trouble breathing   Upper respiratory infection   Nausea and throwing up (vomiting)   Loose bowel movements (diarrhea)   Constipation (not able to move bowels)   Muscle spasms   Pain in the joints   Back pain   Swelling of your legs, ankles and/or feet   Effects on the nerves are called peripheral neuropathy. You may feel numbness, tingling, or pain in your hands and feet. It may be hard for you to button your clothes, open jars, or walk as usual. The effect on the nerves may get worse with more doses of the drug. These effects get better in some people after the drug is stopped but it does not get better in all people.  Note: Each of the side effects above was reported in 20% or greater of patients treated with daratumumab. Not all possible side effects are included above.  Warnings and Precautions   Severe decrease in the number of white blood cells and platelets    Severe allergic reaction   This medication can affect the results of blood tests that match your blood type. Your blood type will be tested before treatment. Be sure to tell all healthcare providers you are taking this medicine before receiving blood transfusions, even for 6 months after your last dose.  Important Information   This drug may be present in the saliva, tears, sweat, urine, stool, vomit, semen, and vaginal secretions. Talk to  your doctor and/or your nurse about the necessary precautions to take during this time.  Treating Side Effects   Drink plenty of fluids (a minimum of eight glasses per day is recommended).   If you throw up or have loose bowel movements, you should drink more fluids so that you do not become dehydrated (lack of water in the body from losing too much fluid).   To help with nausea and vomiting, eat small, frequent meals instead of three large meals a day. Choose foods and drinks that are at room temperature. Ask your  nurse or doctor about other helpful tips and medicine that is available to help stop or lessen these symptoms.   If you have diarrhea, eat low-fiber foods that are high in protein and calories and avoid foods that can irritate your digestive tracts or lead to cramping.   Ask your nurse or doctor about medicine that can lessen or stop your diarrhea or constipation.   If you are not able to move your bowels, check with your doctor or nurse before you use enemas, laxatives, or suppositories.   Manage tiredness by pacing your activities for the day.   Be sure to include periods of rest between energy-draining activities.   To decrease the risk of infection, wash your hands regularly.   Avoid close contact with people who have a cold, the flu, or other infections.   Take your temperature as your doctor or nurse tells you, and whenever you feel like you may have a fever.   To help decrease the risk of bleeding, use a soft toothbrush. Check with your nurse before using dental floss.   Be very careful when using knives or tools.   Use an electric shaver instead of a razor.   Keeping your pain under control is important to your well-being. Please tell your doctor or nurse if you are experiencing pain.   If you are dizzy, get up slowly after sitting or lying.   If you are having trouble sleeping, talk to your nurse or doctor on tips to help you sleep better.   If you have numbness and tingling in your hands and feet, be careful when cooking, walking, and handling sharp objects and hot liquids.   Infusion reactions may rarely occur after your infusion. If this happens, call 911 for emergency care.  Food and Drug Interactions   There are no known interactions of daratumumab with food.   This drug may interact with other medicines. Tell your doctor and pharmacist about all the prescription and over-the-counter medicines and dietary supplements (vitamins, minerals, herbs and others) that you  are taking at this time. Also, check with your doctor or pharmacist before starting any new prescription or over-the-counter medicines, or dietary supplements to make sure that there are no interactions.  When to Call the Doctor  Call your doctor or nurse if you have any of these symptoms and/or any new or unusual symptoms:   Fever of 100.4 F (38 C) or higher   Chills   Pain in your chest   Coughing up yellow, green, or bloody mucus.   Wheezing or trouble breathing   Tiredness that interferes with your daily activities   Trouble falling or staying asleep   Feeling dizzy or lightheaded   Easy bleeding or bruising   Nausea that stops you from eating or drinking and/or is not relieved by prescribed medicines   Vomiting   No bowel movement in 3 days or when you feel  uncomfortable.   Loose bowel movements (diarrhea) 4 times a day or loose bowel movements with lack of strength or a feeling of being dizzy   Weight gain of 5 pounds in one week (fluid retention)   Swelling of your legs, ankles and/or feet   Pain that does not go away, or is not relieved by prescribed medicines   Signs of infusion reaction: fever or shaking chills, flushing, facial swelling, feeling dizzy, headache, trouble breathing, rash, itching, chest tightness, or chest pain. If this happens, call 911 for emergency care.   Numbness, tingling, or pain in your hands and feet   If you think you may be pregnant or may have impregnated your partner  Reproduction Warnings   Pregnancy warning: This drug may have harmful effects on the unborn baby. Women of childbearing potential should use effective methods of birth control during your cancer treatment and for at least 3 months after treatment. Let your doctor know right away if you think you may be pregnant.   Breastfeeding warning: It is not known if this drug passes into breast milk. For this reason, women should talk to their doctor about the risks and  benefits of breastfeeding during treatment with this drug because this drug may enter the breast milk and cause harm to a breastfeeding baby.   Fertility warning: Human fertility studies have not been done with this drug. Talk with your doctor or nurse if you plan to have children. Ask for information on sperm or egg banking.   Bortezomib (Velcade)  About This Drug  Bortezomib is used to treat cancer. It is given in the vein (IV) or by a shot under the skin (subcutaneously).  You will receive this injection under your skin.  Possible Side Effects   Bone marrow suppression. Decrease in the number of white blood cells, red blood cells, and platelets. This may raise your risk of infection, make you tired and weak (fatigue), and raise your risk of bleeding.   Nausea and vomiting (throwing up)   Constipation (not able to move bowels)   Diarrhea (loose bowel movements)   Fever   Tiredness   Decreased appetite (decreased hunger)   Effects on the nerves are called peripheral neuropathy. You may feel numbness, tingling, or pain in your hands and feet. It may be hard for you to button your clothes, open jars, or walk as usual. The effect on the nerves may get worse with more doses of the drug. These effects get better in some people after the drug is stopped but it does not get better in all people.   Rash  Note: Each of the side effects above was reported in 20% or greater of patients treated with bortezomib. Not all possible side effects are included above.  Warnings and Precautions   Severe peripheral neuropathy   Low blood pressure   Congestive heart failure - your heart has less ability to pump blood properly.   Trouble breathing because of fluid build-up and/or inflammation in your lungs   Nausea, vomiting, diarrhea and constipation which sometimes requires treatment to help lessen these side effects. There is also an increased risk of developing a partial or complete  blockage of your small and/or large intestine.   Changes in your central nervous system can happen. The central nervous system is made up of your brain and spinal cord. You could feel extreme tiredness, agitation, confusion, have hallucinations (see or hear things that are not there), trouble understanding or speaking, loss of  control of your bowels or bladder, eyesight changes, numbness or lack of strength to your arms, legs, face, or body, seizures or coma. If you start to have any of these symptoms let your doctor know right away.   Tumor lysis syndrome: This drug may act on the cancer cells very quickly. This may affect how your kidneys work.   Changes in your liver function Increased risk of a syndrome that affects your red blood cells, platelets and blood vessels in your kidneys, which can cause kidney failure and be life-threatening.  Important Information   This drug may be present in the saliva, tears, sweat, urine, stool, vomit, semen, and vaginal secretions. Talk to your doctor and/or your nurse about the necessary precautions to take during this time.   This drug may impair your ability to drive or use machinery. Use caution and tell your nurse or doctor if you feel dizzy, very sleepy, and/or experience low blood pressure.  Treating Side Effects   Manage tiredness by pacing your activities for the day.   Be sure to include periods of rest between energy-draining activities.   To decrease the risk of infection, wash your hands regularly.   Avoid close contact with people who have a cold, the flu, or other infections.   Take your temperature as your doctor or nurse tells you, and whenever you feel like you may have a fever.   To help decrease the risk of bleeding, use a soft toothbrush. Check with your nurse before using dental floss.   Be very careful when using knives or tools.   Use an electric shaver instead of a razor.   Ask your doctor or nurse about medicines that  are available to help stop or lessen constipation.   If you are not able to move your bowels, check with your doctor or nurse before you use enemas, laxatives, or suppositories.   Drink plenty of fluids (a minimum of eight glasses per day is recommended).   If you throw up or have loose bowel movements, you should drink more fluids so that you do not become dehydrated (lack of water in the body from losing too much fluid).   If you have diarrhea, eat low-fiber foods that are high in protein and calories and avoid foods that can irritate your digestive tracts or lead to cramping.   Ask your nurse or doctor about medicine that can lessen or stop your diarrhea.   To help with nausea and vomiting, eat small, frequent meals instead of three large meals a day. Choose foods and drinks that are at room temperature. Ask your nurse or doctor about other helpful tips and medicine that is available to help stop or lessen these symptoms.   To help with decreased appetite, eat foods high in calories and protein, such as meat, poultry, fish, dry beans, tofu, eggs, nuts, milk, yogurt, cheese, ice cream, pudding, and nutritional supplements.   Consider using sauces and spices to increase taste. Daily exercise, with your doctors approval, may increase your appetite.   If you have numbness and tingling in your hands and feet, be careful when cooking, walking, and handling sharp objects and hot liquids.   If you get a rash do not put anything on it unless your doctor or nurse says you may. Keep the area around the rash clean and dry. Ask your doctor for medicine if your rash bothers you.  Food and Drug Interactions   This drug may interact with grapefruit and grapefruit  juice. Talk to your doctor as this could make side effects worse.   Check with your doctor or pharmacist about all other prescription medicines and over-the-counter medicines and dietary supplements (vitamins, minerals, herbs and others) you  are taking before starting this medicine as there are known drug interactions with bortezomib. Also, check with your doctor or pharmacist before starting any new prescription or over-the-counter medicines, or dietary supplements to make sure that there are no interactions.   Avoid the use of [place] with bortezomib as this may lower the levels of the drug in your body, which can make it less effective.  When to Call the Doctor  Call your doctor or nurse if you have any of these symptoms and/or any new or unusual symptoms:   Fever of 100.4 F (38 C) or higher   Chills   Tiredness that interferes with your daily activities   Feeling dizzy or lightheaded   Feeling that your heart is beating in a fast or not normal way (palpitations)   Cough   Wheezing or trouble breathing   Easy bleeding or bruising   Confusion and/or agitation   Hallucinations   Trouble understanding or speaking   Blurry vision or changes in your eyesight   Numbness or lack of strength to your arms, legs, face, or body   Symptoms of a seizure such as confusion, blacking out, passing out, loss of hearing or vision, blurred vision, unusual smells or tastes (such as burning rubber), trouble talking, tremors or shaking in parts or all of the body, repeated body movements, tense muscles that do not relax, and loss of control of urine and bowels. If you or your family member suspects you are having a seizure, call 911 right away.   Nausea that stops you from eating or drinking and/or is not relieved by prescribed medicines   Throwing up    Lasting loss of appetite or rapid weight loss of five pounds in a week   No bowel movement in 3 days or when you feel uncomfortable.   Abdominal pain that does not go away   Diarrhea, 4 times in one day or diarrhea with lack of strength or a feeling of being dizzy   Numbness, tingling, or pain your hands and feet   Swelling of legs, ankles, and/or feet   Weight  gain of 5 pounds in one week (fluid retention)   Decreased urine, or very dark urine   New rash and/or itching   Rash that is not relieved by prescribed medicines   Signs of tumor lysis: Confusion or agitation, decreased urine, nausea/vomiting, diarrhea, muscle cramping, numbness and/or tingling, seizures.   Signs of possible liver problems: dark urine, pale bowel movements, bad stomach pain, feeling very tired and weak, unusual itching, or yellowing of the eyes or skin   If you think you are pregnant or may have impregnated your partner  Reproduction Warnings   Pregnancy warning: This drug can have harmful effects on the unborn baby. Women of child bearing potential should use effective methods of birth control during your cancer treatment and for at least 7 months after treatment. Men with female partners of childbearing potential should use effective methods of birth control during your cancer treatment and for at least 4 months after your cancer treatment. Let your doctor know right away if you think you may be pregnant or may have impregnated your partner.   Breastfeeding warning: Women should not breastfeed during treatment and for 2 months  month after treatment because this drug could enter the breast milk and cause harm to a breastfeeding baby.   Fertility warning: In men and women both, this drug may affect your ability to have children in the future. Talk with your doctor or nurse if you plan to have children. Ask for information on sperm or egg banking.   Lenalidomide (Revlimid)  About This Drug Lenalidomide is used to treat cancer. It is given orally (by mouth).  Possible Side Effects  Bone marrow suppression. This is a decrease in the number of white blood cells, red blood cells, and platelets. This may raise your risk of infection, make you tired and weak (fatigue), and raise your risk of bleeding.   Nausea   Diarrhea (loose bowel movements)   Constipation (unable to  move bowels)   Inflammation of your stomach and/or intestines   Pain in your abdomen or back pain   Fever   Tiredness and weakness   Swelling of your legs, ankles and/or feet   Decreased appetite (decreased hunger)   Muscle cramps/spasms   Pain in your joints   Headache   Feeling dizzy   Tremor   Trouble sleeping   Nosebleed   Upper respiratory infection, bronchitis   Inflammation of the nasal passages and throat   Trouble breathing   Cough   Rash and itching  Note: Each of the side effects above was reported in 15% or greater of patients treated with lenalidomide. Not all possible side effects are included above.  Warnings and Precautions  Blood clots and events such as stroke and heart attack. A blood clot in your leg may cause your leg to swell, appear red and warm, and/or cause pain. A blood clot in your lungs may cause trouble breathing, pain when breathing, and/or chest pain.   Severe bone marrow suppression   Changes in your liver function, which may cause liver failure and be life-threatening.   Tumor lysis syndrome: This drug may act on the cancer cells very quickly. This may affect how your kidneys work and can be life-threatening.   Changes in your thyroid function   Severe allergic skin reaction which may be life-threatening. You may develop blisters on your skin that are filled with fluid or a severe red rash all over your body that may be painful.   This drug may raise your risk of getting a second cancer.   You may develop a syndrome called tumor flare reaction. You may have painful lymph nodes, enlarged spleen, fever, and a rash.   This drug may make it more difficult to collect your stem cells if a stem cell transplant is part of your treatment plan.   There is a rare increased risk of death in patients with chronic lymphocytic leukemia and a risk of early death (dying sooner) in patient with mantle cell lymphoma.   Allergic reactions,  including anaphylaxis are rare but may happen in some patients. Signs of allergic reaction to this drug may be swelling of the face, feeling like your tongue or throat are swelling, trouble breathing, rash, itching, fever, chills, feeling dizzy, and/or feeling that your heart is beating in a fast or not normal way. If this happens, do not take another dose of this drug. You should get urgent medical treatment.  Note: Some of the side effects above are very rare. If you have concerns and/or questions, please discuss them with your medical team.  Important Information  You will need to sign up for  a special program called Revlimid REMS when you start taking this drug. Your nurse will help you get started.   Two negative pregnancy tests are required in women of childbearing potential prior to starting treatment. Routine pregnancy tests are required during treatment.   Do not donate blood during your treatment and for 4 weeks after your treatment.   Men should not donate sperm during your treatment and for 4 weeks after your treatment because this drug is present in semen and may badly harm a baby.   How to Take Your Medication  Swallow the medicine whole with water, with or without food. Do not chew, break, or open it.   Take this medicine at about the same time each day   Missed dose: If you miss a dose, take it as soon as you think about it ONLY if it has been less than 12 hours since you normally take the missed dose. If it has been more than 12 hours, skip the missed dose and contact your physician. Take your next dose at the regular time. Do not take 2 doses at the same time and do not double up on the next dose.   If you vomit a dose, take your next dose at the regular time.   Handling: Wash your hands after handling your medicine, your caretakers should not handle your medicine with bare hands and should wear latex gloves.   If you get any of the content of a broken capsules on your  skin, you should wash the area of the skin well with soap and water right away. Call your doctor if you get a skin reaction.   This drug may be present in the saliva, tears, sweat, urine, stool, vomit, semen, and vaginal secretions. Talk to your doctor and/or your nurse about the necessary precautions to take during this time.   Storage: Store this medicine in the original container at room temperature.   Disposal of unused medicine: Do not flush any expired and/or unused medicine down the toilet or drain unless you are specifically instructed to do so on the medication label. Some facilities have take-back programs and/or other options. If you do not have a take-back program in your area, then please discuss with your nurse or your doctor how to dispose of unused medicine.  Treating Side Effects  Manage tiredness by pacing your activities for the day.   Be sure to include periods of rest between energy-draining activities.   If you are dizzy, get up slowly after sitting or lying.   To decrease the risk of infection, wash your hands regularly.   Avoid close contact with people who have a cold, the flu, or other infections.   Take your temperature as your doctor or nurse tells you, and whenever you feel like you may have a fever.   To help decrease the risk of bleeding, use a soft toothbrush. Check with your nurse before using dental floss.   Be very careful when using knives or tools.   Use an electric shaver instead of a razor.   Ask your doctor or nurse about medicines that are available to help stop or lessen constipation and/or diarrhea.   If you are not able to move your bowels, check with your doctor or nurse before you use enemas, laxatives, or suppositories.   Drink plenty of fluids (a minimum of eight glasses per day is recommended).   Drink fluids that contribute calories (whole milk, juice, soft drinks, sweetened beverages, milkshakes,  and nutritional supplements) instead  of water.   If you throw up or have loose bowel movements, you should drink more fluids so that you do not become dehydrated (lack of water in the body from losing too much fluid).   If you have diarrhea, eat low-fiber foods that are high in protein and calories and avoid foods that can irritate your digestive tracts or lead to cramping.   To help with nausea and vomiting, eat small, frequent meals instead of three large meals a day.  Choose foods and drinks that are at room temperature. Ask your nurse or doctor about other helpful tips and medicine that is available to help stop or lessen these symptoms.   To help with decreased appetite, eat small, frequent meals. Eat foods high in calories and protein, such as meat, poultry, fish, dry beans, tofu, eggs, nuts, milk, yogurt, cheese, ice cream, pudding, and nutritional supplements.   Consider using sauces and spices to increase taste. Daily exercise, with your doctors approval, may increase your appetite.   If you get a rash, do not put anything on it unless your doctor or nurse says you may. Keep the area around the rash clean and dry. Ask your doctor for medicine if your rash bothers you.   Keeping your pain under control is important to your well-being. Please tell your doctor or nurse if you are experiencing pain.   If you are having trouble sleeping, talk to your nurse or doctor on tips to help you sleep better.   If you have a nosebleed, sit with your head tipped slightly forward. Apply pressure by lightly pinching the bridge of your nose between your thumb and forefinger. Call your doctor if you feel dizzy or faint or if the bleeding does not stop after 10 to 15 minutes.   Moisturize your skin several times a day.   Avoid sun exposure and apply sunscreen routinely when outdoors.  Food and Drug Interactions   There are no known interactions of lenalidomide with food.   Check with your doctor or pharmacist about all other  prescription medicines and over-the-counter medicines and dietary supplements (vitamins, minerals, herbs, and others) you are taking before starting this medicine as there are known drug interactions with lenalidomide. Also, check with your doctor or pharmacist before starting any new prescription or over-the-counter medicines, or dietary supplements to make sure that there are no interactions.   There are known interactions of lenalidomide with blood-thinning medicine such as warfarin. Ask your doctor what precautions you should take.  When to Call the Doctor Call your doctor or nurse if you have any of these symptoms and/or any new or unusual symptoms:  Fever of 100.4 F (38 C) or higher   Chills   Tiredness that interferes with your daily activities   Feeling dizzy or lightheaded   Easy bleeding or bruising   Your leg or arm is swollen, red, warm, and/or painful   Headache that does not go away   Nosebleed that does not stop bleeding after 10-15 minutes   Painful lymph nodes   Wheezing and/or trouble breathing   Chest pain or symptoms of a heart attack. Most heart attacks involve pain in the center of the chest that lasts more than a few minutes. The pain may go away and come back. It can feel like pressure, squeezing, fullness, or pain. Sometimes pain is felt in one or both arms, the back, neck, jaw, or stomach. If any of these symptoms last  2 minutes, call 911.   Symptoms of a stroke such as sudden numbness or weakness of your face, arm, or leg, mostly on one side of your body; sudden confusion, trouble speaking or understanding; sudden trouble seeing in one or both eyes; sudden trouble walking, feeling dizzy, loss of balance or coordination; or sudden, bad headache with no known cause. If you have any of these symptoms for 2 minutes, call 911.   Signs of allergic reaction: swelling of the face, feeling like your tongue or throat are swelling, trouble breathing, rash, itching,  fever, chills, feeling dizzy, and/or feeling that your heart is beating in a fast or not normal way. If this happens, call 911 for emergency care.   Coughing up yellow, green, or bloody mucus   Feeling that your heart is beating in a fast or not normal way (palpitations)   Nausea that stops you from eating or drinking and/or is not relieved by prescribed medicines   Throwing up more than 3 times a day   Loose bowel movements (diarrhea) 4 times a day or loose bowel movements with lack of strength or a feeling of being dizzy   No bowel movement in 3 days or when you feel uncomfortable   Trouble falling or staying asleep   Pain in your abdomen that does not go away   Weight gain of 5 pounds in one week (fluid retention)   Swelling of your legs, ankles and/or feet   Unexplained weight gain   Lasting loss of appetite or rapid weight loss of five pounds in a week   Pain that does not go away, or is not relieved by prescribed medicines   Flu-like symptoms: fever, headache, muscle and joint aches, and fatigue (low energy, feeling weak)   A new rash or itching that is not relieved by prescribed medicines   Signs of possible liver problems: dark urine, pale bowel movements, bad stomach pain, feeling very tired and weak, unusual itching, or yellowing of the eyes or skin   Signs of tumor lysis: confusion or agitation, decreased urine, nausea/vomiting, diarrhea, muscle cramping, numbness and/or tingling, seizures   If you think you may be pregnant or may have impregnated your partner  Reproduction Warnings   Pregnancy warning: This drug can have harmful effects on the unborn baby. Women of childbearing potential must commit to abstain from heterosexual intercourse or use 2 effective methods of birth control, one of which, must be a highly effective method of birth control, beginning at least 4 weeks before treatment starts, during your cancer treatment, including dose interruptions, and  for at least 4 weeks after treatment. A highly effective method of birth control includes tubal ligation, intrauterine device (IUD), hormonal (birth control pills, injections, patch and/or implants) or a partners vasectomy. Stop taking lenalidomide immediately and let your doctor know right away if you think you may be pregnant, miss your menstrual period, or experience unusual menstrual bleeding.   Men with female partners of childbearing potential should use effective methods of birth control during your cancer treatment and for at least 4 weeks after your cancer treatment.    Breastfeeding warning: Women should not breastfeed during treatment because this drug could enter the breast milk and cause harm to a breastfeeding baby.   Fertility warning: Human fertility studies have not been done with this drug. Talk with your doctor or nurse if you plan to have children. Ask for information on sperm or egg banking.   Dexamethasone (Decadron)  About This  Drug  Dexamethasone is used to treat cancer, to decrease inflammation and sometimes used before and after chemotherapy to prevent or treat nausea and/or vomiting. It is given in the vein (IV) or orally (by mouth).  Possible Side Effects   Headache   High blood pressure   Abnormal heart beat   Tiredness and weakness   Changes in mood, which may include depression or a feeling of extreme well-being   Trouble sleeping   Increased sweating   Increased appetite (increased hunger)   Weight gain   Increase risk of infections   Pain in your abdomen   Nausea   Skin changes such as rash, dryness, redness   Blood sugar levels may change   Electrolyte changes   Swelling of your legs, ankles and/or feet   Changes in your liver function   You may be at risk for cataracts, glaucoma or infections of the eye   Muscle loss and / or weakness (lack of muscle strength)   Increased risk of developing osteoporosis- your bones may become  weak and brittle  Note: Not all possible side effects are included above.  Warnings and Precautions   This drug may cause you to feel irritable, nervous or restless.   Allergic reactions, including anaphylaxis are rare but may happen in some patients. Signs of allergic reaction to this drug may be swelling of the face, feeling like your tongue or throat are swelling, trouble breathing, rash, itching, fever, chills, feeling dizzy, and/or feeling that your heart is beating in a fast or not normal way. If this happens, do not take another dose of this drug. You should get urgent medical treatment.   High blood pressure and changes in electrolytes, which can cause fluid build-up around your heart, lungs or elsewhere.   Increased risk of developing a hole in your stomach, small, and/or large intestine if you have ulcers in the lining of your stomach and/or intestine, or have diverticulitis, ulcerative colitis and/or other diseases that affect the gastrointestinal tract.   Effects on the endocrine glands including the pituitary, adrenals or thyroid during or after use of this medication.   Changes in the tissue of the heart, that can cause your heart to have less ability to pump blood. You may be short of breath or our arms, hands, legs and feet may swell.   Increased risk of heart attack.   Severe depression and other psychiatric disorders such as mood changes.   Burning, pain and itching around your anus may happen when this drug is given in the vein too rapidly (IV). It usually happens suddenly and resolves in less than 1 minute.  Important Information   Talk to your doctor or your nurse before stopping this medication, it should be stopped gradually. Depending on the dose and length of treatment, you could experience serious side effects if stopped abruptly (suddenly).   Talk to your doctor before receiving any vaccinations during your treatment. Some vaccinations are not recommended while  receiving dexamethasone.  How to Take Your Medication   For Oral (by mouth): You can take the medicine with or without food. If you have nausea or upset stomach, take it with food.   Missed dose: If you miss a dose, do not take 2 doses at the same time or extra doses.  If you vomit a dose, take your next dose at the regular time. Do not take 2 doses at the same time   Handling: Wash your hands after handling your medicine,  your caretakers should not handle your medicine with bare hands and should wear latex gloves.   Storage: Store this medicine in the original container at room temperature. Protect from moisture and light. Discuss with your nurse or your doctor how to dispose of unused medicine.   Treating Side Effects   Drink plenty of fluids (a minimum of eight glasses per day is recommended).   To help with nausea and vomiting, eat small, frequent meals instead of three large meals a day. Choose foods and drinks that are at room temperature. Ask your nurse or doctor about other helpful tips and medicine that is available to help stop or lessen these symptoms.   If you throw up, you should drink more fluids so that you do not become dehydrated (lack of water in the body from losing too much fluid).   Manage tiredness by pacing your activities for the day.   Be sure to include periods of rest between energy-draining activities.   To help with muscle weakness, get regular exercise. If you feel too tired to exercise vigorously, try taking a short walk.   If you are having trouble sleeping, talk to your nurse or doctor on tips to help you sleep better.   If you are feeling depressed, talk to your nurse or doctor about it.   Keeping your pain under control is important to your well-being. Please tell your doctor or nurse if you are experiencing pain.   If you have diabetes, keep good control of your blood sugar level. Tell your nurse or your doctor if your glucose levels are higher or  lower than normal.   To decrease the risk of infection, wash your hands regularly.   Avoid close contact with people who have a cold, the flu, or other infections.   Take your temperature as your doctor or nurse tells you, and whenever you feel like you may have a fever.   If you get a rash do not put anything on it unless your doctor or nurse says you may. Keep the area around the rash clean and dry. Ask your doctor for medicine if your rash bothers you.   Moisturize your skin several times day.   Avoid sun exposure and apply sunscreen routinely when outdoors.  Food and Drug Interactions   There are no known interactions of dexamethasone with food.   Check with your doctor or pharmacist about all other prescription medicines and over-the-counter medicines and dietary supplements (vitamins, minerals, herbs and others) you are taking before starting this medicine as there are known drug interactions with dexamethasone. Also, check with your doctor or pharmacist before starting any new prescription or over-the-counter medicines, or dietary supplement to make sure that there are no interactions.   There are known interactions of dexamethasone with other medicines and products like acetaminophen, aspirin, and ibuprofen. Ask your doctor what over-the-counter (OTC) medicines you can take.  When to Call the Doctor  Call your doctor or nurse if you have any of these symptoms and/or any new or unusual symptoms:   Fever of 100.4 F (38 C) or higher   Chills   A headache that does not go away   Trouble breathing   Blurry vision or other changes in eyesight   Feel irritable, nervous or restless   Trouble falling or staying asleep   Severe mood changes such as depression or unusual thoughts and/or behaviors   Thoughts of hurting yourself or others, and suicide   Tiredness that interferes  with your daily activities   Feeling that your heart is beating in a fast, slow or not normal  way   Feeling dizzy or lightheaded   Chest pain or symptoms of a heart attack. Most heart attacks involve pain in the center of the chest that lasts more than a few minutes. The pain may go away and come back, or it can be constant. It can feel like pressure, squeezing, fullness, or pain. Sometimes pain is felt in one or both arms, the back, neck, jaw, or stomach. If any of these symptoms last 2 minutes, call 911.   Heartburn or indigestion   Nausea that stops you from eating or drinking and/or is not relieved by prescribed medicines   Throwing up more than 3 times a day   Pain in your abdomen that does not go away   Abnormal blood sugar   Unusual thirst, passing urine often, headache, sweating, shakiness, irritability   Swelling of legs, ankles, or feet   Weight gain of 5 pounds in one week (fluid retention)   Signs of possible liver problems: dark urine, pale bowel movements, bad stomach pain, feeling very tired and weak, unusual itching, or yellowing of the eyes or skin   Severe muscle weakness   A new rash or a rash that is not relieved by prescribed medicines   Signs of allergic reaction: swelling of the face, feeling like your tongue or throat are swelling, trouble breathing, rash, itching, fever, chills, feeling dizzy, and/or feeling that your heart is beating in a fast or not normal way. If this happens, call 911 for emergency care.   If you think you may be pregnant  Reproduction Warnings   Pregnancy warning: It is not known if this drug may harm an unborn child. For this reason, be sure to talk with your doctor if you are pregnant or planning to become pregnant while receiving this drug. Let your doctor know right away if you think you may be pregnant or may have impregnated your partner.   Breastfeeding warning: It is not known if this drug passes into breast milk. For this reason, women should talk to their doctor about the risks and benefits of breastfeeding during  treatment with this drug because this drug may enter the breast milk and cause harm to a breastfeeding baby.   Fertility warning: Human fertility studies have not been done with this drug. Talk with your doctor or nurse if you plan to have children. Ask for information on sperm banking.    SELF CARE ACTIVITIES WHILE ON CHEMOTHERAPY/IMMUNOTHERAPY:  Hydration Increase your fluid intake 48 hours prior to treatment and drink at least 8 to 12 cups (64 ounces) of water/decaffeinated beverages per day after treatment. You can still have your cup of coffee or soda but these beverages do not count as part of your 8 to 12 cups that you need to drink daily. No alcohol intake.  Medications Continue taking your normal prescription medication as prescribed.  If you start any new herbal or new supplements please let us know first to make sure it is safe.  Mouth Care Have teeth cleaned professionally before starting treatment. Keep dentures and partial plates clean. Use soft toothbrush and do not use mouthwashes that contain alcohol. Biotene is a good mouthwash that is available at most pharmacies or may be ordered by calling [phone]. Use warm salt water gargles (1 teaspoon salt per 1 quart warm water) before and after meals and at bedtime. Or  you may rinse with 2 tablespoons of three-percent hydrogen peroxide mixed in eight ounces of water. If you are still having problems with your mouth or sores in your mouth please call the clinic. If you need dental work, please let the doctor know before you go for your appointment so that we can coordinate the best possible time for you in regards to your chemo regimen. You need to also let your dentist know that you are actively taking chemo. We may need to do labs prior to your dental appointment.  Skin Care Always use sunscreen that has not expired and with SPF (Sun Protection Factor) of 50 or higher. Wear hats to protect your head from the sun. Remember to use  sunscreen on your hands, ears, face, & feet.  Use good moisturizing lotions such as udder cream, eucerin, or even Vaseline. Some chemotherapies can cause dry skin, color changes in your skin and nails.    Avoid long, hot showers or baths. Use gentle, fragrance-free soaps and laundry detergent. Use moisturizers, preferably creams or ointments rather than lotions because the thicker consistency is better at preventing skin dehydration. Apply the cream or ointment within 15 minutes of showering. Reapply moisturizer at night, and moisturize your hands every time after you wash them.   Infection Prevention Please wash your hands for at least 30 seconds using warm soapy water. Handwashing is the #1 way to prevent the spread of germs. Stay away from sick people or people who are getting over a cold. If you develop respiratory systems such as green/yellow mucus production or productive cough or persistent cough let us know and we will see if you need an antibiotic. It is a good idea to keep a pair of gloves on when going into grocery stores/[employer] to decrease your risk of coming into contact with germs on the carts, etc. Carry alcohol hand gel with you at all times and use it frequently if out in public. If your temperature reaches 100.5 or higher please call the clinic and let us know.  If it is after hours or on the weekend please go to the ER if your temperature is over 100.4.  Please have your own personal thermometer at home to use.    Sex and bodily fluids If you are going to have sex, a condom must be used to protect the person that isn't taking immunotherapy. For a few days after treatment, immunotherapy can be excreted through your bodily fluids.  When using the toilet please close the lid and flush the toilet twice.  Do this for a few day after you have had immunotherapy.   Contraception It is not known for sure whether or not immunotherapy drugs can be passed on through semen or secretions from the  vagina. Because of this some doctors advise people to use a barrier method if you have sex during treatment. This applies to vaginal, anal or oral sex.  Generally, doctors advise a barrier method only for the time you are actually having the treatment and for about a week after your treatment.  Advice like this can be worrying, but this does not mean that you have to avoid being intimate with your partner. You can still have close contact with your partner and continue to enjoy sex.  Animals If you have cats or birds we just ask that you not change the litter or change the cage.  Please have someone else do this for you while you are on immunotherapy.   Food  Safety During and After Cancer Treatment Food safety is important for people both during and after cancer treatment. Cancer and cancer treatments, such as chemotherapy, radiation therapy, and stem cell/bone marrow transplantation, often weaken the immune system. This makes it harder for your body to protect itself from foodborne illness, also called food poisoning. Foodborne illness is caused by eating food that contains harmful bacteria, parasites, or viruses.  Foods to avoid Some foods have a higher risk of becoming tainted with bacteria. These include: Unwashed fresh fruit and vegetables, especially leafy vegetables that can hide dirt and other contaminants Raw sprouts, such as alfalfa sprouts Raw or undercooked beef, especially ground beef, or other raw or undercooked meat and poultry Fatty, fried, or spicy foods immediately before or after treatment.  These can sit heavy on your stomach and make you feel nauseous. Raw or undercooked shellfish, such as oysters. Sushi and sashimi, which often contain raw fish.  Unpasteurized beverages, such as unpasteurized fruit juices, raw milk, raw yogurt, or cider Undercooked eggs, such as soft boiled, over easy, and poached; raw, unpasteurized eggs; or foods made with raw egg, such as homemade raw  cookie dough and homemade mayonnaise  Simple steps for food safety  Shop smart. Do not buy food stored or displayed in an unclean area. Do not buy bruised or damaged fruits or vegetables. Do not buy cans that have cracks, dents, or bulges. Pick up foods that can spoil at the end of your shopping trip and store them in a cooler on the way home.  Prepare and clean up foods carefully. Rinse all fresh fruits and vegetables under running water, and dry them with a clean towel or paper towel. Clean the top of cans before opening them. After preparing food, wash your hands for 20 seconds with hot water and soap. Pay special attention to areas between fingers and under nails. Clean your utensils and dishes with hot water and soap. Disinfect your kitchen and cutting boards using 1 teaspoon of liquid, unscented bleach mixed into 1 quart of water.    Dispose of old food. Eat canned and packaged food before its expiration date (the use by or best before date). Consume refrigerated leftovers within 3 to 4 days. After that time, throw out the food. Even if the food does not smell or look spoiled, it still may be unsafe. Some bacteria, such as Listeria, can grow even on foods stored in the refrigerator if they are kept for too long.  Take precautions when eating out. At restaurants, avoid buffets and salad bars where food sits out for a long time and comes in contact with many people. Food can become contaminated when someone with a virus, often a norovirus, or another bug handles it. Put any leftover food in a to-go container yourself, rather than having the server do it. And, refrigerate leftovers as soon as you get home. Choose restaurants that are clean and that are willing to prepare your food as you order it cooked.    SYMPTOMS TO REPORT AS SOON AS POSSIBLE AFTER TREATMENT:  FEVER GREATER THAN 100.4 F CHILLS WITH OR WITHOUT FEVER NAUSEA AND VOMITING THAT IS NOT CONTROLLED WITH YOUR  NAUSEA MEDICATION UNUSUAL SHORTNESS OF BREATH UNUSUAL BRUISING OR BLEEDING TENDERNESS IN MOUTH AND THROAT WITH OR WITHOUT PRESENCE OF ULCERS URINARY PROBLEMS BOWEL PROBLEMS UNUSUAL RASH     Wear comfortable clothing and clothing appropriate for easy access to any Portacath or PICC line. Let us know if there is anything that  we can do to make your therapy better!   What to do if you need assistance after hours or on the weekends: CALL [phone].  HOLD on the line, do not hang up.  You will hear multiple messages but at the end you will be connected with a nurse triage line.  They will contact the doctor if necessary.  Most of the time they will be able to assist you.  Do not call the hospital operator.    I have been informed and understand all of the instructions given to me and have received a copy. I have been instructed to call the clinic [phone] or my family physician as soon as possible for continued medical care, if indicated. I do not have any more questions at this time but understand that I may call the [facility] or the Patient Navigator at [phone] during office hours should I have questions or need assistance in obtaining follow-up care.

## 2021-07-19 NOTE — Patient Instructions (Addendum)
[place] at [facility] Discharge Instructions   You were seen and examined today by Dr. Delton Coombes.  He reviewed the results of your bone marrow biopsy, which confirms multiple myeloma.  We will start you on a treatment regimen that consists of four different drugs - those drugs are Darzalex, Velcade, Revlimid, and dexamethasone.  You will come to the clinic weekly to get the Darzalex and the Velcade.  The Revlimid (chemo pill) and dexamethasone are pills we will prescribe that you can take at home.   Multiple myeloma puts you at increased risk for blood clots.  You should start taking an aspirin 81 mg daily.   We will reach out to Dr. Lorenda Cahill to get his opinion about prophylaxis during treatment.      Thank you for choosing [place] at [facility] to provide your oncology and hematology care.  To afford each patient quality time with our provider, please arrive at least 15 minutes before your scheduled appointment time.   If you have a lab appointment with the [facility] please come in thru the Main Entrance and check in at the main information desk.  You need to re-schedule your appointment should you arrive 10 or more minutes late.  We strive to give you quality time with our providers, and arriving late affects you and other patients whose appointments are after yours.  Also, if you no show three or more times for appointments you may be dismissed from the clinic at the providers discretion.     Again, thank you for choosing [facility].  Our hope is that these requests will decrease the amount of time that you wait before being seen by our physicians.       _____________________________________________________________  Should you have questions after your visit to [facility], please contact our office at [phone] and follow the prompts.  Our office hours are 8:00 a.m. and 4:30 p.m. Monday - Friday.   Please note that voicemails left after 4:00 p.m. may not be returned until the following business day.  We are closed weekends and major holidays.  You do have access to a nurse 24-7, just call the main number to the clinic [phone] and do not press any options, hold on the line and a nurse will answer the phone.    For prescription refill requests, have your pharmacy contact our office and allow 72 hours.    Due to Covid, you will need to wear a mask upon entering the hospital. If you do not have a mask, a mask will be given to you at the Main Entrance upon arrival. For doctor visits, patients may have 1 support person age 33 or older with them. For treatment visits, patients can not have anyone with them due to social distancing guidelines and our immunocompromised population.

## 2021-07-19 NOTE — Progress Notes (Signed)
START ON PATHWAY REGIMEN - Multiple Myeloma and Other Plasma Cell Dyscrasias   DaraVRd (Daratumumab SUBQ + Bortezomib SUBQ + Lenalidomide PO + Dexamethasone IV/PO) q21 Days (Induction Schema):   A cycle is every 21 days:     Lenalidomide      Dexamethasone      Bortezomib      Daratumumab and hyaluronidase-fihj    DaraVRd (Daratumumab SUBQ + Bortezomib SUBQ + Lenalidomide PO + Dexamethasone IV/PO) q21 Days (Consolidation Schema):   A cycle is every 21 days:     Lenalidomide      Dexamethasone      Bortezomib      Daratumumab and hyaluronidase-fihj   **Always confirm dose/schedule in your pharmacy ordering system**  Patient Characteristics: Multiple Myeloma, Newly Diagnosed, Transplant Eligible, Standard Risk Disease Classification: Multiple Myeloma R-ISS Staging: I Therapeutic Status: Newly Diagnosed Is Patient Eligible for Transplant<= Transplant Eligible Risk Status: Standard Risk Intent of Therapy: Non-Curative / Palliative Intent, Discussed with Patient 

## 2021-07-20 ENCOUNTER — Encounter (HOSPITAL_COMMUNITY): Payer: Self-pay | Admitting: Hematology

## 2021-07-20 ENCOUNTER — Other Ambulatory Visit (HOSPITAL_COMMUNITY): Payer: Self-pay

## 2021-07-20 ENCOUNTER — Telehealth (HOSPITAL_COMMUNITY): Payer: Self-pay | Admitting: Pharmacist

## 2021-07-20 DIAGNOSIS — C9000 Multiple myeloma not having achieved remission: Secondary | ICD-10-CM

## 2021-07-20 MED ORDER — LENALIDOMIDE 20 MG PO CAPS
20.0000 mg | ORAL_CAPSULE | Freq: Every day | ORAL | 0 refills | Status: DC
  Filled 2021-07-20: qty 14, 14d supply, fill #0

## 2021-07-20 NOTE — Telephone Encounter (Signed)
Oral Oncology Pharmacist Encounter  Received new prescription for Revlimid (lenalidomide) for the treatment of newly diagonised multiple myeloma in conjunction with daratumumab, bortezomib, and dexamethsone, planned duration until disease control/transplant ready. Treatment per the GRIFFIN Study.   CMP from 06/13/21 assessed, no relevant lab abnormalities. Prescription dose and frequency assessed.   Current medication list in Epic reviewed, one DDIs with lenalidomide identified: Estrogen (Prempro): Estrogen derivatives may enhance the thrombogenic effect of lenalidomide. Closely monitor patients using this combination for signs and symptoms of thromboembolism such as swelling, redness, warmth, or pain in the legs, dyspnea, tachypnea, hemoptysis, or chest pain. Advise patients of potential risks and encourage prompt reporting of any thromboembolism signs or symptoms.   Evaluated chart and no patient barriers to medication adherence identified.   Prescription has been e-scribed to the [facility] for benefits analysis and approval.  Oral Oncology Clinic will continue to follow for insurance authorization, copayment issues, initial counseling and start date.   Darl Pikes, PharmD, BCPS, BCOP, CPP Hematology/Oncology Clinical Pharmacist Practitioner /DB/AP Oral [facility] [phone]  07/20/2021 4:21 PM

## 2021-07-23 ENCOUNTER — Ambulatory Visit (HOSPITAL_COMMUNITY)
Admission: RE | Admit: 2021-07-23 | Discharge: 2021-07-23 | Disposition: A | Discharge status: Home / Self Care | Payer: Medicare Other | Source: Ambulatory Visit | Attending: Hematology | Admitting: Hematology

## 2021-07-23 ENCOUNTER — Other Ambulatory Visit: Payer: Self-pay

## 2021-07-23 ENCOUNTER — Other Ambulatory Visit (HOSPITAL_COMMUNITY): Payer: Self-pay

## 2021-07-23 ENCOUNTER — Other Ambulatory Visit (HOSPITAL_COMMUNITY): Payer: Self-pay | Admitting: *Deleted

## 2021-07-23 ENCOUNTER — Encounter (HOSPITAL_COMMUNITY): Payer: Self-pay | Admitting: Hematology

## 2021-07-23 ENCOUNTER — Telehealth (HOSPITAL_COMMUNITY): Payer: Self-pay | Admitting: Pharmacy Technician

## 2021-07-23 DIAGNOSIS — R0781 Pleurodynia: Secondary | ICD-10-CM | POA: Diagnosis present

## 2021-07-23 IMAGING — DX DG RIBS W/ CHEST 3+V*R*
3 series · 3 of 3 positions shown · non-contrast
Comparison: CT [DATE] and chest radiograph [DATE]

CLINICAL DATA: Rib pain on right side.  Multiple myeloma.

EXAM:
RIGHT RIBS AND CHEST - 3+ VIEW

[chest pa]
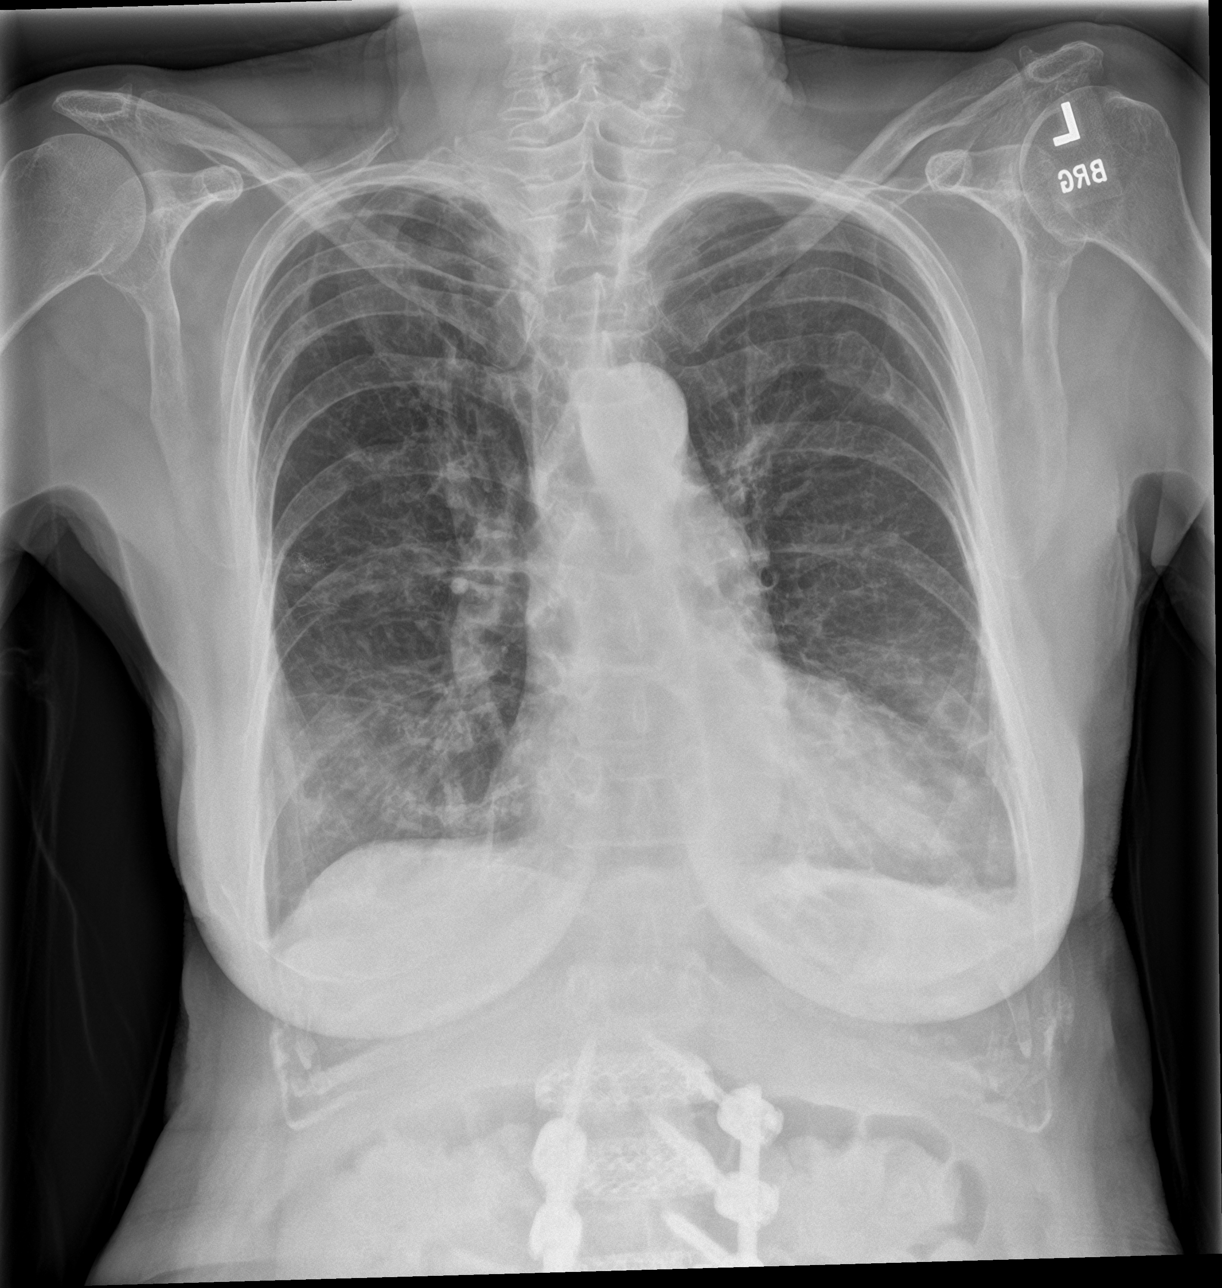

[rib pa]
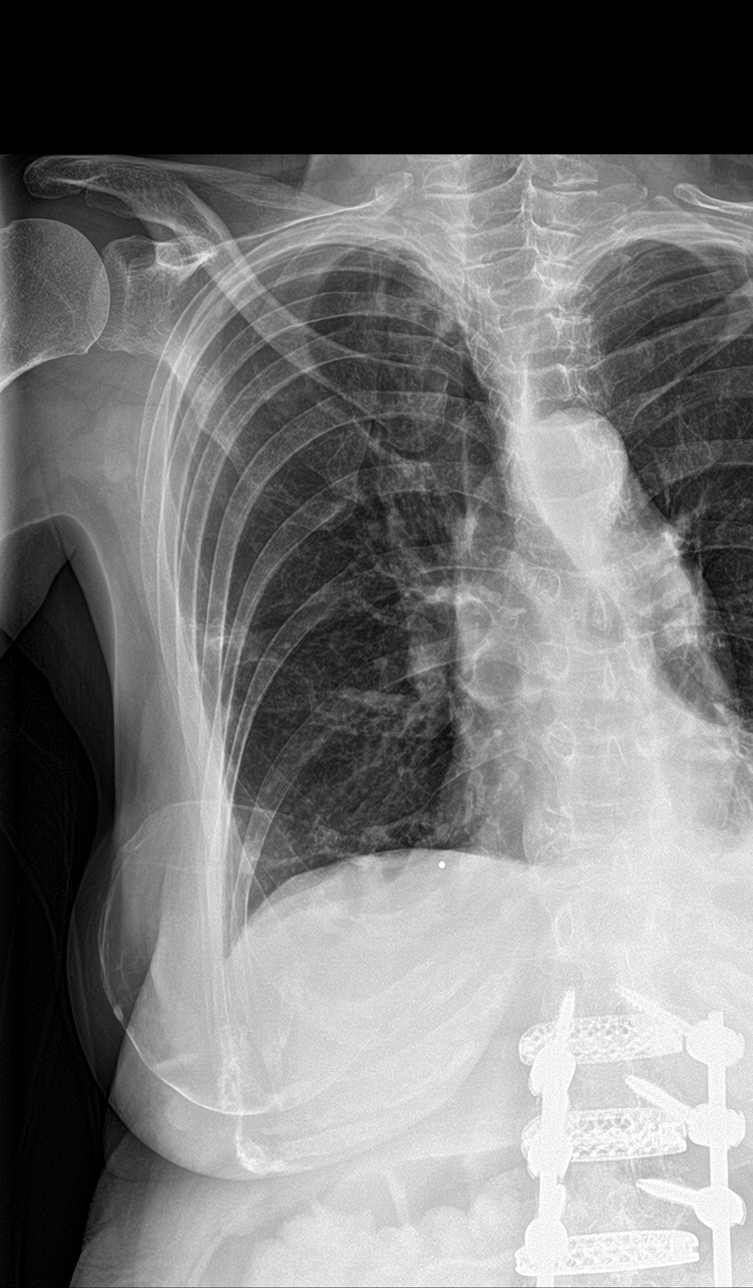

[rib pa obl]
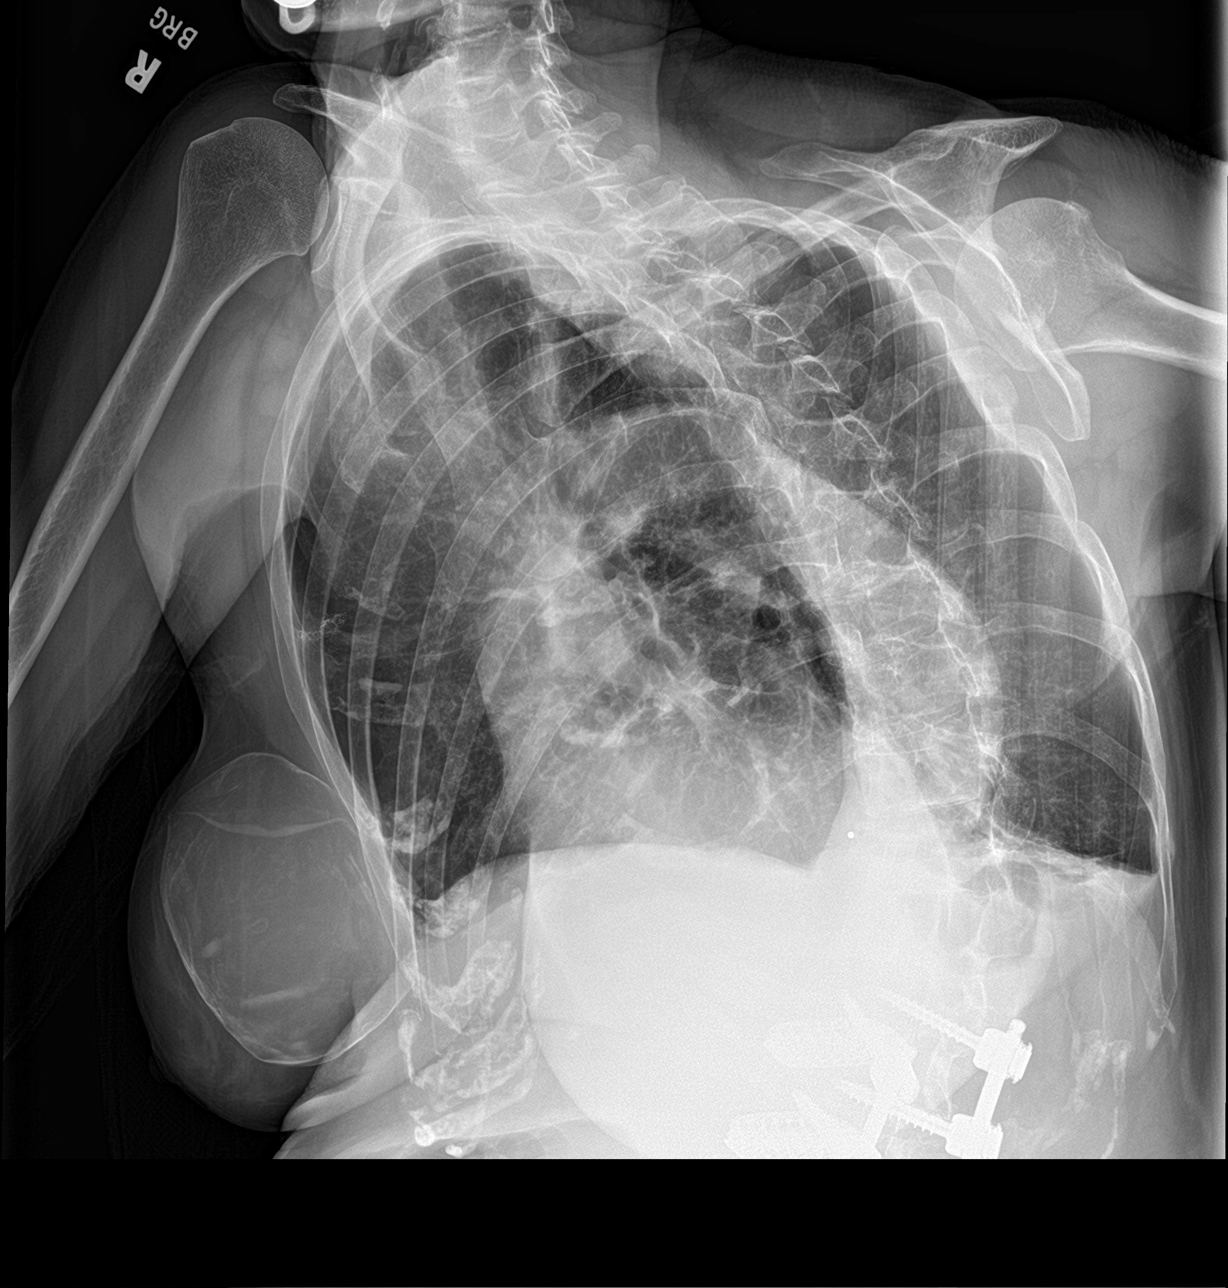

[3 of 3 positions shown; findings below may reference images not displayed]

FINDINGS: Again noted is an expansile lucent lesion involving the posterior
left seventh rib. Evidence for small surgical sutures along the
periphery of the right lung. Hazy densities in the lower lungs
bilaterally. Heart size is within normal limits and stable. Patient
has bilateral breast implants. Mild irregularity and angulation of
the right posterior eleventh rib. Difficult to exclude a subtle
fracture in this area. Bilateral pedicle screw and interbody fusion
in the lumbar spine which is incompletely imaged.
IMPRESSION: 1. Mild irregularity involving the posterior right eleventh rib.
Cannot exclude a subtle fracture at this location.
2. Known lucent lesion involving the posterior left seventh rib.
3. Hazy densities at lung bases could be related to atelectasis.

## 2021-07-23 MED ORDER — LENALIDOMIDE 20 MG PO CAPS: 20.0000 mg | ORAL_CAPSULE | Freq: Every day | ORAL | 0 refills | Status: DC

## 2021-07-23 NOTE — Telephone Encounter (Signed)
Oral Oncology Patient Advocate Encounter  Prior Authorization for Revlimid has been approved.    PA# [id number] Effective dates: 07/23/21 through 07/14/22  Patients co-pay is $2437.17.    Rx sent to [facility] pharmacy.  Oral Oncology Clinic will continue to follow.   [place] Patient [place] Phone [phone] Fax [phone] 07/23/2021 12:29 PM

## 2021-07-23 NOTE — Progress Notes (Signed)
Received call from pt's spouse.  He reports Amanda Conner had sudden onset of severe right rib pain after coughing and heard a "crack".  Right ribs and chest x-ray ordered.  Informed spouse that pt could present to radiology at their convenience for xray - no appointment necessary.  Verbalizes understanding.

## 2021-07-23 NOTE — Telephone Encounter (Signed)
Oral Oncology Patient Advocate Encounter   Received notification from [facility] that prior authorization for Revlimid is required.   PA submitted on CoverMyMeds Key [id number] Status is pending   Oral Oncology Clinic will continue to follow.  [place] Patient [place] Phone [phone] Fax [phone] 07/23/2021 11:07 AM

## 2021-07-24 ENCOUNTER — Inpatient Hospital Stay (HOSPITAL_COMMUNITY): Payer: Medicare Other | Admitting: General Practice

## 2021-07-24 DIAGNOSIS — C9000 Multiple myeloma not having achieved remission: Secondary | ICD-10-CM

## 2021-07-24 NOTE — Progress Notes (Signed)
[place] Work  Initial Assessment   Amanda Conner is a 71 y.o. year old female contacted by phone. Clinical Social Work was referred by medical oncologist for assessment of psychosocial needs.   SDOH (Social Determinants of Health) assessments performed: Yes SDOH Interventions    Flowsheet Row Most Recent Value  SDOH Interventions   Food Insecurity Interventions Intervention Not Indicated  Financial Strain Interventions Intervention Not Indicated  Housing Interventions Intervention Not Indicated  Social Connections Interventions Intervention Not Indicated  Transportation Interventions Intervention Not Indicated       Distress Screen completed: No No flowsheet data found.    Family/Social Information:  Housing Arrangement: patient lives with husband who is very supportive., "He has taken over all the household stuff, keeps me fed."   Family members/support persons in your life? School Transportation concerns: no, husband drives  Employment: Retired. Income source: Paediatric nurse concerns: No Type of concern: None Food access concerns: no Religious or spiritual practice: no Medication Concerns: no  Services Currently in place:  none  Coping/ Adjustment to diagnosis: Patient understands treatment plan and what happens next? yes, newly diagnosed with Stage 1 multiple myeloma, found after some orthopedic injury/scan.  She had had other health issues, this has taught her to "do more research."  She is fairly sedentary at this point due to sudden onset back pain, has fracture of T12 vertebra.  "This will just take some time and rest until it starts to heal."   Concerns about diagnosis and/or treatment: I'm not especially worried about anything Patient reported stressors: Physical issues Hopes and priorities: cope with any challenges or discomfort on a day to day basis.  Her back gives her significant pain at this time, she hopes that she will  heal from the fracture and pain will be reduced.  Patient enjoys gardening and cooking, walking the dog, being outside.  She has not gotten her activity level back to previous activity level post spinal fusion surgery.   Current coping skills/ strengths: Ability for insight , Average or above average intelligence , Capable of independent living , Communication skills , Motivation for treatment/growth , and Supportive family/friends     SUMMARY: Current SDOH Barriers:  none  Interventions: Discussed common feeling and emotions when being diagnosed with cancer, and the importance of support during treatment Informed patient of the support team roles and support services at [facility] Provided CSW contact information and encouraged patient to call with any questions or concerns Provided patient with information about [employer] and Patient and [place] programming   Follow Up Plan: Patient will contact CSW with any support or resource needs Patient verbalizes understanding of plan: Yes    [place] Work, [place], [place] Social Worker Phone:  [phone]

## 2021-07-25 ENCOUNTER — Other Ambulatory Visit (HOSPITAL_COMMUNITY): Payer: Self-pay

## 2021-07-25 ENCOUNTER — Telehealth (HOSPITAL_COMMUNITY): Payer: Self-pay | Admitting: Pharmacy Technician

## 2021-07-25 LAB — SURGICAL PATHOLOGY

## 2021-07-25 NOTE — Telephone Encounter (Signed)
Oral Oncology Patient Advocate Encounter  Was successful in securing patient a $12,000 grant from [facility] to provide copayment coverage for Revlimid.  This will keep the out of pocket expense at $0.     [employer] ID: [id number]  I have spoken with the patient.   The billing information is as follows and has been shared with [facility].    RxBin: [id number] PCN: [id number] Member ID: [id number] Group ID: [id number] Dates of Eligibility: 06/25/21 through 06/24/22  Fund:  [place] Patient [place] Phone [phone] Fax [phone] 07/31/2021 10:44 AM

## 2021-07-26 ENCOUNTER — Inpatient Hospital Stay (HOSPITAL_COMMUNITY): Payer: Medicare Other

## 2021-07-26 ENCOUNTER — Other Ambulatory Visit: Payer: Self-pay

## 2021-07-26 ENCOUNTER — Ambulatory Visit (HOSPITAL_COMMUNITY): Payer: Medicare Other | Admitting: Hematology

## 2021-07-26 VITALS — BP 144/89 | HR 92 | Temp 98.5°F | Resp 18 | Ht 64.0 in | Wt 132.4 lb

## 2021-07-26 DIAGNOSIS — C9000 Multiple myeloma not having achieved remission: Secondary | ICD-10-CM | POA: Diagnosis not present

## 2021-07-26 LAB — CBC WITH DIFFERENTIAL/PLATELET
Abs Immature Granulocytes: 0.01 10*3/uL (ref 0.00–0.07)
Basophils Absolute: 0 10*3/uL (ref 0.0–0.1)
Basophils Relative: 1 %
Eosinophils Absolute: 0.2 10*3/uL (ref 0.0–0.5)
Eosinophils Relative: 4 %
HCT: 36 % (ref 36.0–46.0)
Hemoglobin: 11.5 g/dL — ABNORMAL LOW (ref 12.0–15.0)
Immature Granulocytes: 0 %
Lymphocytes Relative: 25 %
Lymphs Abs: 1.2 10*3/uL (ref 0.7–4.0)
MCH: 30 pg (ref 26.0–34.0)
MCHC: 31.9 g/dL (ref 30.0–36.0)
MCV: 94 fL (ref 80.0–100.0)
Monocytes Absolute: 0.3 10*3/uL (ref 0.1–1.0)
Monocytes Relative: 6 %
Neutro Abs: 3.1 10*3/uL (ref 1.7–7.7)
Neutrophils Relative %: 64 %
Platelets: 328 10*3/uL (ref 150–400)
RBC: 3.83 MIL/uL — ABNORMAL LOW (ref 3.87–5.11)
RDW: 14.3 % (ref 11.5–15.5)
WBC: 4.9 10*3/uL (ref 4.0–10.5)
nRBC: 0 % (ref 0.0–0.2)

## 2021-07-26 LAB — COMPREHENSIVE METABOLIC PANEL
ALT: 18 U/L (ref 0–44)
AST: 24 U/L (ref 15–41)
Albumin: 4 g/dL (ref 3.5–5.0)
Alkaline Phosphatase: 80 U/L (ref 38–126)
Anion gap: 11 (ref 5–15)
BUN: 12 mg/dL (ref 8–23)
CO2: 27 mmol/L (ref 22–32)
Calcium: 9.5 mg/dL (ref 8.9–10.3)
Chloride: 93 mmol/L — ABNORMAL LOW (ref 98–111)
Creatinine, Ser: 0.65 mg/dL (ref 0.44–1.00)
GFR, Estimated: >60 mL/min (ref >60)
Glucose, Bld: 115 mg/dL — ABNORMAL HIGH (ref 70–99)
Potassium: 3.6 mmol/L (ref 3.5–5.1)
Sodium: 131 mmol/L — ABNORMAL LOW (ref 135–145)
Total Bilirubin: 0.4 mg/dL (ref 0.3–1.2)
Total Protein: 9.1 g/dL — ABNORMAL HIGH (ref 6.5–8.1)

## 2021-07-26 LAB — TYPE AND SCREEN
ABO/RH(D): A POS
Antibody Screen: NEGATIVE

## 2021-07-26 LAB — MAGNESIUM: Magnesium: 2 mg/dL (ref 1.7–2.4)

## 2021-07-26 MED ORDER — BORTEZOMIB CHEMO SQ INJECTION 3.5 MG (2.5MG/ML)
1.3000 mg/m2 | Freq: Once | INTRAMUSCULAR | Status: AC
  Administered 2021-07-26: 2.25 mg via SUBCUTANEOUS
  Filled 2021-07-26: qty 0.9

## 2021-07-26 MED ORDER — DARATUMUMAB-HYALURONIDASE-FIHJ 1800-30000 MG-UT/15ML ~~LOC~~ SOLN
1800.0000 mg | Freq: Once | SUBCUTANEOUS | Status: AC
  Administered 2021-07-26: 1800 mg via SUBCUTANEOUS
  Filled 2021-07-26: qty 15

## 2021-07-26 MED ORDER — ACETAMINOPHEN 325 MG PO TABS
650.0000 mg | ORAL_TABLET | Freq: Once | ORAL | Status: AC
  Administered 2021-07-26: 650 mg via ORAL
  Filled 2021-07-26: qty 2

## 2021-07-26 MED ORDER — DIPHENHYDRAMINE HCL 25 MG PO CAPS
50.0000 mg | ORAL_CAPSULE | Freq: Once | ORAL | Status: AC
  Administered 2021-07-26: 50 mg via ORAL
  Filled 2021-07-26: qty 2

## 2021-07-26 MED ORDER — MONTELUKAST SODIUM 10 MG PO TABS
10.0000 mg | ORAL_TABLET | Freq: Once | ORAL | Status: AC
  Administered 2021-07-26: 10 mg via ORAL
  Filled 2021-07-26: qty 1

## 2021-07-26 MED ORDER — LACTULOSE 20 GM/30ML PO SOLN: ORAL | 3 refills | Status: DC

## 2021-07-26 MED ORDER — DEXAMETHASONE 4 MG PO TABS
20.0000 mg | ORAL_TABLET | Freq: Once | ORAL | Status: AC
  Administered 2021-07-26: 20 mg via ORAL
  Filled 2021-07-26: qty 5

## 2021-07-26 NOTE — Progress Notes (Signed)
Pt presents today for C1D1 Daratumumab [place] and Velcade injection per provider's order.Vitals signs and lab WNL for treatment. Okay to proceed with treatment today per Dr.K  Pt c/o a slight headache after 2 hour post wait time of receiving Daratumumab [place]. Message sent to Dr.K/A.Anderson-RN and he stated it was okay to discharge patient.  C1D1 Dara  and Velcade injection given today per MD orders. Tolerated infusion without adverse affects. Vital signs stable. No complaints at this time. Discharged from clinic via wheelchair in stable condition. Alert and oriented x 3. F/U with [facility] as scheduled.

## 2021-07-26 NOTE — Progress Notes (Signed)

## 2021-07-26 NOTE — Patient Instructions (Signed)
[place]  Discharge Instructions: Thank you for choosing [facility] to provide your oncology and hematology care.  If you have a lab appointment with the [facility], please come in thru the Main Entrance and check in at the main information desk.  Wear comfortable clothing and clothing appropriate for easy access to any Portacath or PICC line.   We strive to give you quality time with your provider. You may need to reschedule your appointment if you arrive late (15 or more minutes).  Arriving late affects you and other patients whose appointments are after yours.  Also, if you miss three or more appointments without notifying the office, you may be dismissed from the clinic at the providers discretion.      For prescription refill requests, have your pharmacy contact our office and allow 72 hours for refills to be completed.    Today you received the following chemotherapy and/or immunotherapy agents C1D1 Dara [place] and Velcade injection.   To help prevent nausea and vomiting after your treatment, we encourage you to take your nausea medication as directed.  BELOW ARE SYMPTOMS THAT SHOULD BE REPORTED IMMEDIATELY: *FEVER GREATER THAN 100.4 F (38 C) OR HIGHER *CHILLS OR SWEATING *NAUSEA AND VOMITING THAT IS NOT CONTROLLED WITH YOUR NAUSEA MEDICATION *UNUSUAL SHORTNESS OF BREATH *UNUSUAL BRUISING OR BLEEDING *URINARY PROBLEMS (pain or burning when urinating, or frequent urination) *BOWEL PROBLEMS (unusual diarrhea, constipation, pain near the anus) TENDERNESS IN MOUTH AND THROAT WITH OR WITHOUT PRESENCE OF ULCERS (sore throat, sores in mouth, or a toothache) UNUSUAL RASH, SWELLING OR PAIN  UNUSUAL VAGINAL DISCHARGE OR ITCHING   Items with * indicate a potential emergency and should be followed up as soon as possible or go to the Emergency Department if any problems should occur.  Please show the CHEMOTHERAPY ALERT CARD or IMMUNOTHERAPY ALERT CARD at check-in  to the Emergency Department and triage nurse.  Should you have questions after your visit or need to cancel or reschedule your appointment, please contact [facility] [phone]  and follow the prompts.  Office hours are 8:00 a.m. to 4:30 p.m. Monday - Friday. Please note that voicemails left after 4:00 p.m. may not be returned until the following business day.  We are closed weekends and major holidays. You have access to a nurse at all times for urgent questions. Please call the main number to the clinic [phone] and follow the prompts.  For any non-urgent questions, you may also contact your provider using MyChart. We now offer e-Visits for anyone 68 and older to request care online for non-urgent symptoms. For details visit [email or web address].   Also download the MyChart app! Go to the app store, search "MyChart", open the app, select [place], and log in with your MyChart username and password.  Due to Covid, a mask is required upon entering the hospital/clinic. If you do not have a mask, one will be given to you upon arrival. For doctor visits, patients may have 1 support person aged 3 or older with them. For treatment visits, patients cannot have anyone with them due to current Covid guidelines and our immunocompromised population.

## 2021-07-27 ENCOUNTER — Telehealth (HOSPITAL_COMMUNITY): Payer: Self-pay | Admitting: *Deleted

## 2021-07-27 NOTE — Telephone Encounter (Signed)
Pt called today for 24 hour chemotherapy follow-up. Pt stated she had a headache that lasted until bedtime yesterday, but she took Ibuprofen and it was relieved. Today she feels a lot better. Pt denies any other side effects. Pt advised to call the clinic if needed.

## 2021-08-02 ENCOUNTER — Inpatient Hospital Stay (HOSPITAL_COMMUNITY): Payer: Medicare Other

## 2021-08-02 ENCOUNTER — Other Ambulatory Visit: Payer: Self-pay

## 2021-08-02 ENCOUNTER — Other Ambulatory Visit (HOSPITAL_COMMUNITY): Payer: Medicare Other

## 2021-08-02 ENCOUNTER — Ambulatory Visit (HOSPITAL_COMMUNITY): Payer: Medicare Other | Admitting: Physician Assistant

## 2021-08-02 VITALS — BP 112/68 | HR 86 | Temp 97.7°F | Resp 18

## 2021-08-02 DIAGNOSIS — C9000 Multiple myeloma not having achieved remission: Secondary | ICD-10-CM | POA: Diagnosis not present

## 2021-08-02 LAB — COMPREHENSIVE METABOLIC PANEL
ALT: 14 U/L (ref 0–44)
AST: 16 U/L (ref 15–41)
Albumin: 3.9 g/dL (ref 3.5–5.0)
Alkaline Phosphatase: 77 U/L (ref 38–126)
Anion gap: 9 (ref 5–15)
BUN: 12 mg/dL (ref 8–23)
CO2: 26 mmol/L (ref 22–32)
Calcium: 8.8 mg/dL — ABNORMAL LOW (ref 8.9–10.3)
Chloride: 96 mmol/L — ABNORMAL LOW (ref 98–111)
Creatinine, Ser: 0.63 mg/dL (ref 0.44–1.00)
GFR, Estimated: >60 mL/min (ref >60)
Glucose, Bld: 111 mg/dL — ABNORMAL HIGH (ref 70–99)
Potassium: 3.9 mmol/L (ref 3.5–5.1)
Sodium: 131 mmol/L — ABNORMAL LOW (ref 135–145)
Total Bilirubin: 0.6 mg/dL (ref 0.3–1.2)
Total Protein: 7.5 g/dL (ref 6.5–8.1)

## 2021-08-02 LAB — CBC WITH DIFFERENTIAL/PLATELET
Abs Immature Granulocytes: 0.02 10*3/uL (ref 0.00–0.07)
Basophils Absolute: 0 10*3/uL (ref 0.0–0.1)
Basophils Relative: 0 %
Eosinophils Absolute: 0.4 10*3/uL (ref 0.0–0.5)
Eosinophils Relative: 7 %
HCT: 33.7 % — ABNORMAL LOW (ref 36.0–46.0)
Hemoglobin: 11 g/dL — ABNORMAL LOW (ref 12.0–15.0)
Immature Granulocytes: 0 %
Lymphocytes Relative: 9 %
Lymphs Abs: 0.5 10*3/uL — ABNORMAL LOW (ref 0.7–4.0)
MCH: 31 pg (ref 26.0–34.0)
MCHC: 32.6 g/dL (ref 30.0–36.0)
MCV: 94.9 fL (ref 80.0–100.0)
Monocytes Absolute: 0.2 10*3/uL (ref 0.1–1.0)
Monocytes Relative: 4 %
Neutro Abs: 4.5 10*3/uL (ref 1.7–7.7)
Neutrophils Relative %: 80 %
Platelets: 270 10*3/uL (ref 150–400)
RBC: 3.55 MIL/uL — ABNORMAL LOW (ref 3.87–5.11)
RDW: 14.2 % (ref 11.5–15.5)
WBC: 5.7 10*3/uL (ref 4.0–10.5)
nRBC: 0 % (ref 0.0–0.2)

## 2021-08-02 LAB — MAGNESIUM: Magnesium: 1.9 mg/dL (ref 1.7–2.4)

## 2021-08-02 LAB — LACTATE DEHYDROGENASE: LDH: 95 U/L — ABNORMAL LOW (ref 98–192)

## 2021-08-02 LAB — PRETREATMENT RBC PHENOTYPE

## 2021-08-02 MED ORDER — BORTEZOMIB CHEMO SQ INJECTION 3.5 MG (2.5MG/ML)
1.3000 mg/m2 | Freq: Once | INTRAMUSCULAR | Status: AC
  Administered 2021-08-02: 2.25 mg via SUBCUTANEOUS
  Filled 2021-08-02: qty 0.9

## 2021-08-02 MED ORDER — ACETAMINOPHEN 325 MG PO TABS
650.0000 mg | ORAL_TABLET | Freq: Once | ORAL | Status: AC
  Administered 2021-08-02: 650 mg via ORAL
  Filled 2021-08-02: qty 2

## 2021-08-02 MED ORDER — DIPHENHYDRAMINE HCL 25 MG PO CAPS
50.0000 mg | ORAL_CAPSULE | Freq: Once | ORAL | Status: AC
  Administered 2021-08-02: 50 mg via ORAL
  Filled 2021-08-02: qty 2

## 2021-08-02 MED ORDER — DEXAMETHASONE 4 MG PO TABS
20.0000 mg | ORAL_TABLET | Freq: Once | ORAL | Status: AC
  Administered 2021-08-02: 20 mg via ORAL
  Filled 2021-08-02: qty 5

## 2021-08-02 MED ORDER — DARATUMUMAB-HYALURONIDASE-FIHJ 1800-30000 MG-UT/15ML ~~LOC~~ SOLN
1800.0000 mg | Freq: Once | SUBCUTANEOUS | Status: AC
  Administered 2021-08-02: 1800 mg via SUBCUTANEOUS
  Filled 2021-08-02: qty 15

## 2021-08-02 MED ORDER — MONTELUKAST SODIUM 10 MG PO TABS
10.0000 mg | ORAL_TABLET | Freq: Once | ORAL | Status: AC
  Administered 2021-08-02: 10 mg via ORAL
  Filled 2021-08-02: qty 1

## 2021-08-02 NOTE — Patient Instructions (Signed)
[place]  Discharge Instructions: Thank you for choosing [facility] to provide your oncology and hematology care.  If you have a lab appointment with the [facility], please come in thru the Main Entrance and check in at the main information desk.  Wear comfortable clothing and clothing appropriate for easy access to any Portacath or PICC line.   We strive to give you quality time with your provider. You may need to reschedule your appointment if you arrive late (15 or more minutes).  Arriving late affects you and other patients whose appointments are after yours.  Also, if you miss three or more appointments without notifying the office, you may be dismissed from the clinic at the providers discretion.      For prescription refill requests, have your pharmacy contact our office and allow 72 hours for refills to be completed.    Today you received the following chemotherapy and/or immunotherapy agents Dara [place] and Velcade injection.   To help prevent nausea and vomiting after your treatment, we encourage you to take your nausea medication as directed.  BELOW ARE SYMPTOMS THAT SHOULD BE REPORTED IMMEDIATELY: *FEVER GREATER THAN 100.4 F (38 C) OR HIGHER *CHILLS OR SWEATING *NAUSEA AND VOMITING THAT IS NOT CONTROLLED WITH YOUR NAUSEA MEDICATION *UNUSUAL SHORTNESS OF BREATH *UNUSUAL BRUISING OR BLEEDING *URINARY PROBLEMS (pain or burning when urinating, or frequent urination) *BOWEL PROBLEMS (unusual diarrhea, constipation, pain near the anus) TENDERNESS IN MOUTH AND THROAT WITH OR WITHOUT PRESENCE OF ULCERS (sore throat, sores in mouth, or a toothache) UNUSUAL RASH, SWELLING OR PAIN  UNUSUAL VAGINAL DISCHARGE OR ITCHING   Items with * indicate a potential emergency and should be followed up as soon as possible or go to the Emergency Department if any problems should occur.  Please show the CHEMOTHERAPY ALERT CARD or IMMUNOTHERAPY ALERT CARD at check-in to  the Emergency Department and triage nurse.  Should you have questions after your visit or need to cancel or reschedule your appointment, please contact [facility] [phone]  and follow the prompts.  Office hours are 8:00 a.m. to 4:30 p.m. Monday - Friday. Please note that voicemails left after 4:00 p.m. may not be returned until the following business day.  We are closed weekends and major holidays. You have access to a nurse at all times for urgent questions. Please call the main number to the clinic [phone] and follow the prompts.  For any non-urgent questions, you may also contact your provider using MyChart. We now offer e-Visits for anyone 58 and older to request care online for non-urgent symptoms. For details visit [email or web address].   Also download the MyChart app! Go to the app store, search "MyChart", open the app, select [place], and log in with your MyChart username and password.  Due to Covid, a mask is required upon entering the hospital/clinic. If you do not have a mask, one will be given to you upon arrival. For doctor visits, patients may have 1 support person aged 75 or older with them. For treatment visits, patients cannot have anyone with them due to current Covid guidelines and our immunocompromised population.

## 2021-08-02 NOTE — Progress Notes (Signed)
Pt presents today for Dara [place] and Velcade injection per provider's order. Vital signs and labs WNL for treatment today. Okay to proceed with treatment per Dr.K.   Pt stated she had a sore red area on her buttocks, she stated it was from where she has been scooting out her chair at home, it has caused some friction. Message sent to Dr. Raliegh Ip and he recommended for pt to use a donut pillow and he would assess the area at her next visit. Pt verbalized understanding.  Pt voiced no complaints after 1 hour wait time of receiving Dara [place].   Dara [place] and Velcade given today per MD orders. Tolerated infusion without adverse affects. Vital signs stable. No complaints at this time. Discharged from clinic via wheelchair in stable condition. Alert and oriented x 3. F/U with [facility] as scheduled.

## 2021-08-03 LAB — KAPPA/LAMBDA LIGHT CHAINS
Kappa free light chain: 27.5 mg/L — ABNORMAL HIGH (ref 3.3–19.4)
Kappa, lambda light chain ratio: 3.77 — ABNORMAL HIGH (ref 0.26–1.65)
Lambda free light chains: 7.3 mg/L (ref 5.7–26.3)

## 2021-08-06 ENCOUNTER — Other Ambulatory Visit: Payer: Self-pay

## 2021-08-06 ENCOUNTER — Telehealth: Payer: Self-pay | Admitting: *Deleted

## 2021-08-06 ENCOUNTER — Emergency Department (HOSPITAL_COMMUNITY): Payer: Medicare Other

## 2021-08-06 ENCOUNTER — Encounter (HOSPITAL_COMMUNITY): Payer: Self-pay | Admitting: *Deleted

## 2021-08-06 ENCOUNTER — Inpatient Hospital Stay (HOSPITAL_COMMUNITY)
Admission: EM | Admit: 2021-08-06 | Discharge: 2021-08-09 | DRG: 194 | Disposition: A | Discharge status: Home / Self Care | Payer: Medicare Other | Attending: Internal Medicine | Admitting: Internal Medicine

## 2021-08-06 DIAGNOSIS — E785 Hyperlipidemia, unspecified: Secondary | ICD-10-CM | POA: Diagnosis present

## 2021-08-06 DIAGNOSIS — J479 Bronchiectasis, uncomplicated: Secondary | ICD-10-CM | POA: Diagnosis present

## 2021-08-06 DIAGNOSIS — K219 Gastro-esophageal reflux disease without esophagitis: Secondary | ICD-10-CM | POA: Diagnosis present

## 2021-08-06 DIAGNOSIS — E871 Hypo-osmolality and hyponatremia: Secondary | ICD-10-CM | POA: Diagnosis present

## 2021-08-06 DIAGNOSIS — K5900 Constipation, unspecified: Secondary | ICD-10-CM | POA: Diagnosis present

## 2021-08-06 DIAGNOSIS — Z881 Allergy status to other antibiotic agents status: Secondary | ICD-10-CM | POA: Diagnosis not present

## 2021-08-06 DIAGNOSIS — Z8249 Family history of ischemic heart disease and other diseases of the circulatory system: Secondary | ICD-10-CM

## 2021-08-06 DIAGNOSIS — Z79899 Other long term (current) drug therapy: Secondary | ICD-10-CM | POA: Diagnosis not present

## 2021-08-06 DIAGNOSIS — Z981 Arthrodesis status: Secondary | ICD-10-CM

## 2021-08-06 DIAGNOSIS — Z85828 Personal history of other malignant neoplasm of skin: Secondary | ICD-10-CM | POA: Diagnosis not present

## 2021-08-06 DIAGNOSIS — I10 Essential (primary) hypertension: Secondary | ICD-10-CM

## 2021-08-06 DIAGNOSIS — Z96642 Presence of left artificial hip joint: Secondary | ICD-10-CM | POA: Diagnosis present

## 2021-08-06 DIAGNOSIS — D849 Immunodeficiency, unspecified: Secondary | ICD-10-CM | POA: Diagnosis present

## 2021-08-06 DIAGNOSIS — J189 Pneumonia, unspecified organism: Secondary | ICD-10-CM | POA: Diagnosis present

## 2021-08-06 DIAGNOSIS — Z79890 Hormone replacement therapy (postmenopausal): Secondary | ICD-10-CM

## 2021-08-06 DIAGNOSIS — M4854XD Collapsed vertebra, not elsewhere classified, thoracic region, subsequent encounter for fracture with routine healing: Secondary | ICD-10-CM | POA: Diagnosis present

## 2021-08-06 DIAGNOSIS — M8580 Other specified disorders of bone density and structure, unspecified site: Secondary | ICD-10-CM | POA: Diagnosis present

## 2021-08-06 DIAGNOSIS — E876 Hypokalemia: Secondary | ICD-10-CM | POA: Diagnosis present

## 2021-08-06 DIAGNOSIS — H409 Unspecified glaucoma: Secondary | ICD-10-CM | POA: Diagnosis present

## 2021-08-06 DIAGNOSIS — C9000 Multiple myeloma not having achieved remission: Secondary | ICD-10-CM | POA: Diagnosis present

## 2021-08-06 DIAGNOSIS — Z9882 Breast implant status: Secondary | ICD-10-CM | POA: Diagnosis not present

## 2021-08-06 DIAGNOSIS — Z7982 Long term (current) use of aspirin: Secondary | ICD-10-CM

## 2021-08-06 DIAGNOSIS — S22000A Wedge compression fracture of unspecified thoracic vertebra, initial encounter for closed fracture: Secondary | ICD-10-CM | POA: Diagnosis not present

## 2021-08-06 DIAGNOSIS — Z9049 Acquired absence of other specified parts of digestive tract: Secondary | ICD-10-CM

## 2021-08-06 DIAGNOSIS — M1990 Unspecified osteoarthritis, unspecified site: Secondary | ICD-10-CM | POA: Diagnosis present

## 2021-08-06 DIAGNOSIS — Z20822 Contact with and (suspected) exposure to covid-19: Secondary | ICD-10-CM | POA: Diagnosis present

## 2021-08-06 DIAGNOSIS — Z825 Family history of asthma and other chronic lower respiratory diseases: Secondary | ICD-10-CM

## 2021-08-06 LAB — PROTEIN ELECTROPHORESIS, SERUM
A/G Ratio: 0.9 (ref 0.7–1.7)
Albumin ELP: 3.3 g/dL (ref 2.9–4.4)
Alpha-1-Globulin: 0.4 g/dL (ref 0.0–0.4)
Alpha-2-Globulin: 0.8 g/dL (ref 0.4–1.0)
Beta Globulin: 1.1 g/dL (ref 0.7–1.3)
Gamma Globulin: 1.4 g/dL (ref 0.4–1.8)
Globulin, Total: 3.7 g/dL (ref 2.2–3.9)
M-Spike, %: 0.6 g/dL — ABNORMAL HIGH
Total Protein ELP: 7 g/dL (ref 6.0–8.5)

## 2021-08-06 LAB — CBC WITH DIFFERENTIAL/PLATELET
Abs Immature Granulocytes: 0.02 10*3/uL (ref 0.00–0.07)
Basophils Absolute: 0 10*3/uL (ref 0.0–0.1)
Basophils Relative: 0 %
Eosinophils Absolute: 0.2 10*3/uL (ref 0.0–0.5)
Eosinophils Relative: 3 %
HCT: 35.1 % — ABNORMAL LOW (ref 36.0–46.0)
Hemoglobin: 11.3 g/dL — ABNORMAL LOW (ref 12.0–15.0)
Immature Granulocytes: 0 %
Lymphocytes Relative: 11 %
Lymphs Abs: 0.7 10*3/uL (ref 0.7–4.0)
MCH: 30.9 pg (ref 26.0–34.0)
MCHC: 32.2 g/dL (ref 30.0–36.0)
MCV: 95.9 fL (ref 80.0–100.0)
Monocytes Absolute: 0.6 10*3/uL (ref 0.1–1.0)
Monocytes Relative: 9 %
Neutro Abs: 4.7 10*3/uL (ref 1.7–7.7)
Neutrophils Relative %: 77 %
Platelets: 279 10*3/uL (ref 150–400)
RBC: 3.66 MIL/uL — ABNORMAL LOW (ref 3.87–5.11)
RDW: 14.3 % (ref 11.5–15.5)
WBC: 6.1 10*3/uL (ref 4.0–10.5)
nRBC: 0 % (ref 0.0–0.2)

## 2021-08-06 LAB — TROPONIN I (HIGH SENSITIVITY)
Troponin I (High Sensitivity): 6 ng/L (ref <18)
Troponin I (High Sensitivity): 6 ng/L (ref <18)

## 2021-08-06 LAB — COMPREHENSIVE METABOLIC PANEL
ALT: 14 U/L (ref 0–44)
AST: 13 U/L — ABNORMAL LOW (ref 15–41)
Albumin: 3.7 g/dL (ref 3.5–5.0)
Alkaline Phosphatase: 81 U/L (ref 38–126)
Anion gap: 9 (ref 5–15)
BUN: 8 mg/dL (ref 8–23)
CO2: 24 mmol/L (ref 22–32)
Calcium: 8.9 mg/dL (ref 8.9–10.3)
Chloride: 97 mmol/L — ABNORMAL LOW (ref 98–111)
Creatinine, Ser: 0.52 mg/dL (ref 0.44–1.00)
GFR, Estimated: >60 mL/min (ref >60)
Glucose, Bld: 97 mg/dL (ref 70–99)
Potassium: 3.3 mmol/L — ABNORMAL LOW (ref 3.5–5.1)
Sodium: 130 mmol/L — ABNORMAL LOW (ref 135–145)
Total Bilirubin: 0.4 mg/dL (ref 0.3–1.2)
Total Protein: 6.9 g/dL (ref 6.5–8.1)

## 2021-08-06 LAB — RESP PANEL BY RT-PCR (FLU A&B, COVID) ARPGX2
Influenza A by PCR: NEGATIVE
Influenza B by PCR: NEGATIVE
SARS Coronavirus 2 by RT PCR: NEGATIVE

## 2021-08-06 LAB — LACTIC ACID, PLASMA: Lactic Acid, Venous: 1.1 mmol/L (ref 0.5–1.9)

## 2021-08-06 IMAGING — CT CT ANGIO CHEST
2 of 6 series · 17 of 36 positions shown · IV contrast (Omnipaque or Isovue)
Comparison: Chest radiograph earlier today.  PET-CT [DATE]

CLINICAL DATA: Pulmonary embolism (PE) suspected, high prob

Chest tightness, shortness of breath, fatigue and fever. Active
chemotherapy for multiple myeloma.
EXAM:
CT ANGIOGRAPHY CHEST WITH CONTRAST
TECHNIQUE: Multidetector CT imaging of the chest was performed using the
standard protocol during bolus administration of intravenous
contrast. Multiplanar CT image reconstructions and MIPs were
obtained to evaluate the vascular anatomy.

[Series 5: pe axial thins · axial · 0.67mm/px · z∈[+1138,+1384]mm · 16 of 341 slices shown]
[im 17/341  lung]
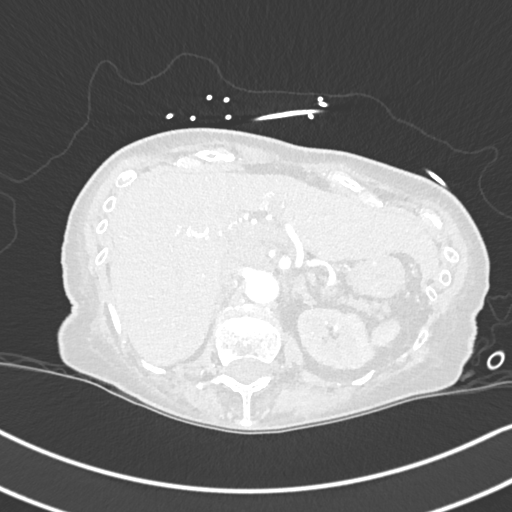
[im 33/341  mediastinal]
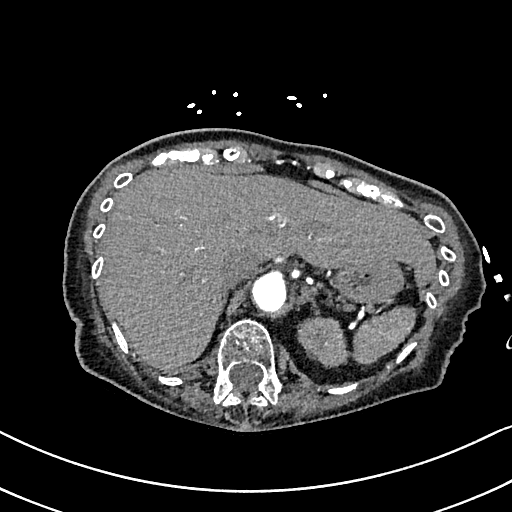
[im 65/341  lung]
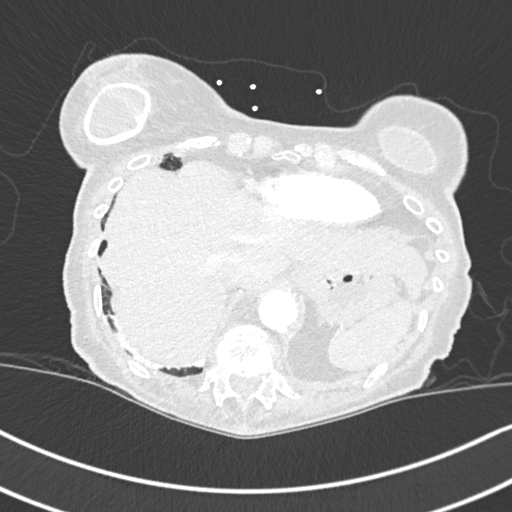
[im 81/341  mediastinal]
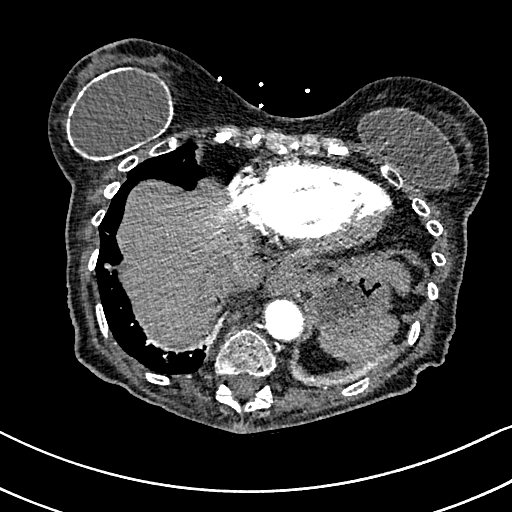
[im 98/341  lung]
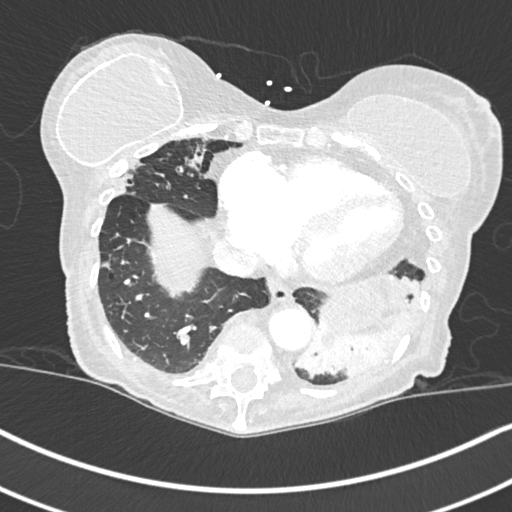
[im 114/341  mediastinal]
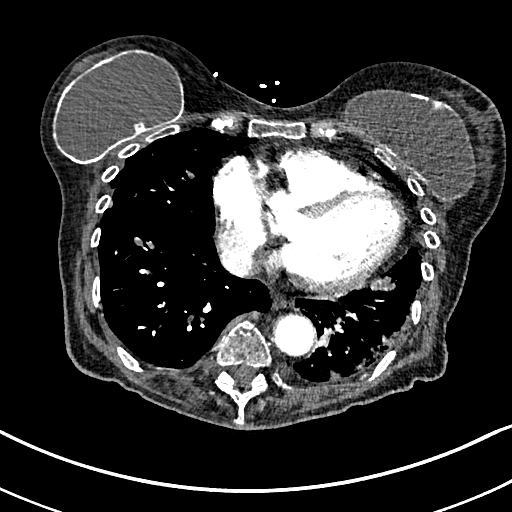
[im 146/341  lung]
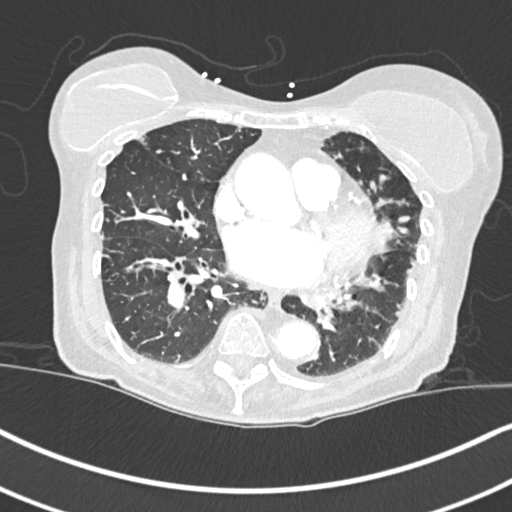
[im 162/341  mediastinal]
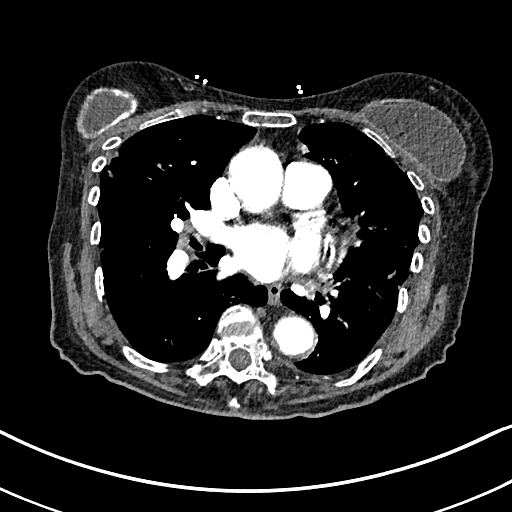
[im 179/341  lung]
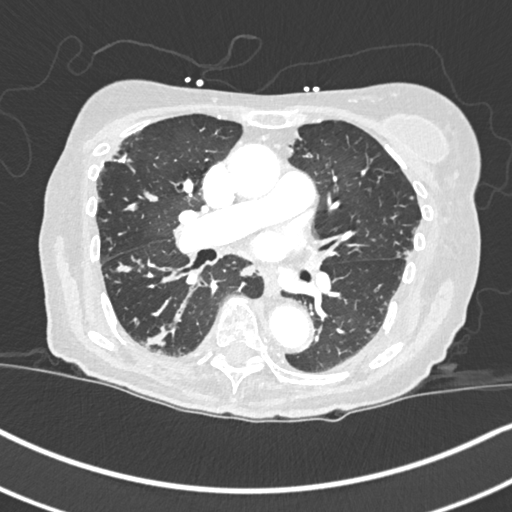
[im 195/341  mediastinal]
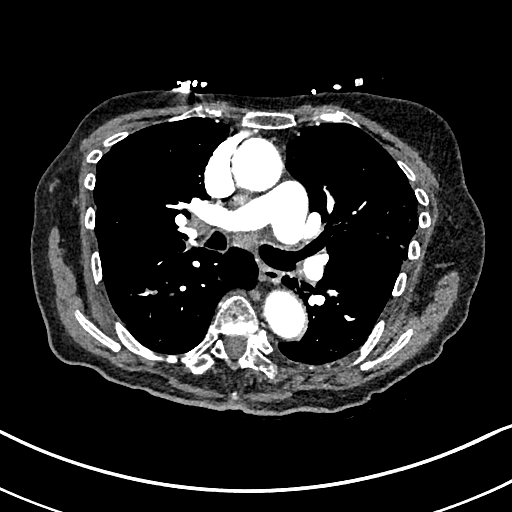
[im 227/341  lung]
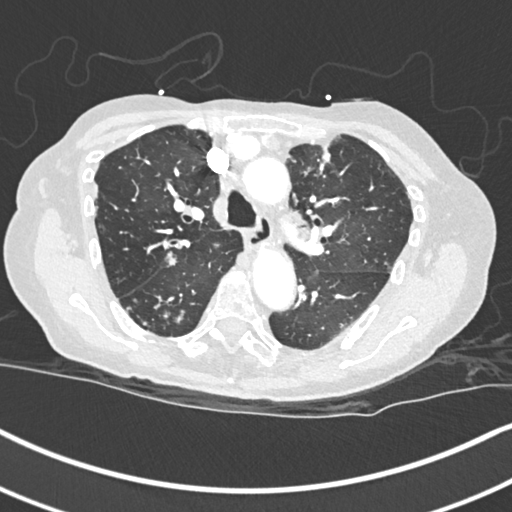
[im 243/341  mediastinal]
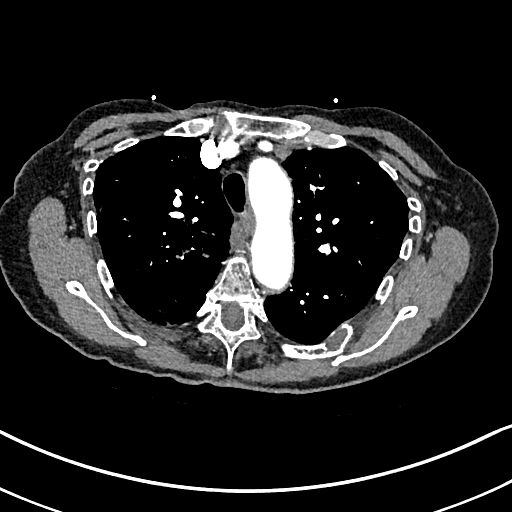
[im 260/341  lung]
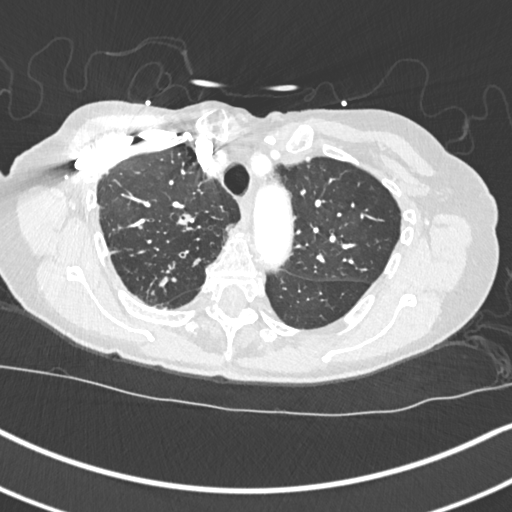
[im 276/341  mediastinal]
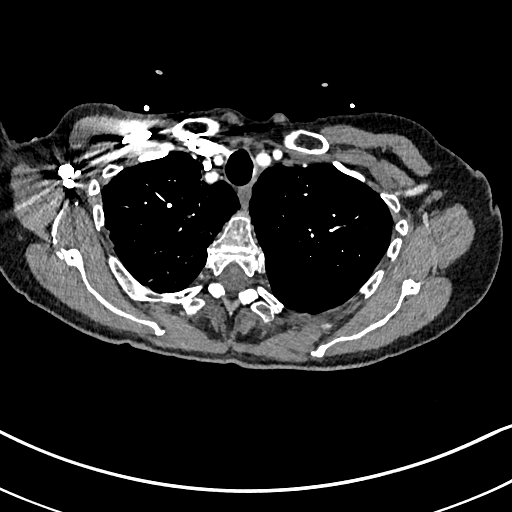
[im 308/341  lung]
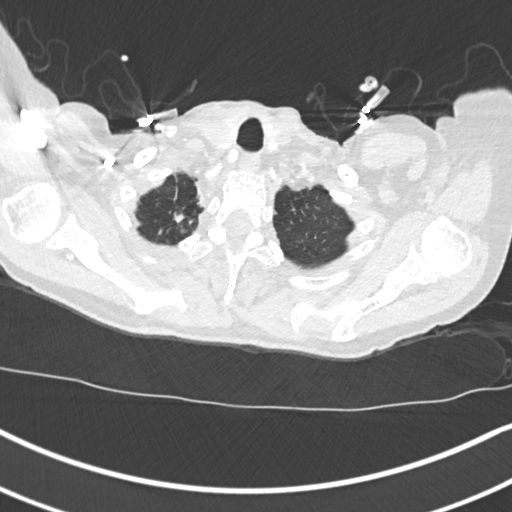
[im 324/341  mediastinal]
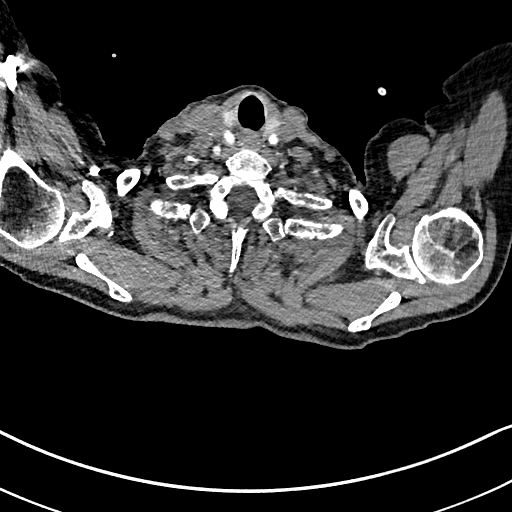

[Series 7: cor soft · coronal · 0.56mm/px · 1 of 128 slices shown]
[im 64/128  mediastinal]
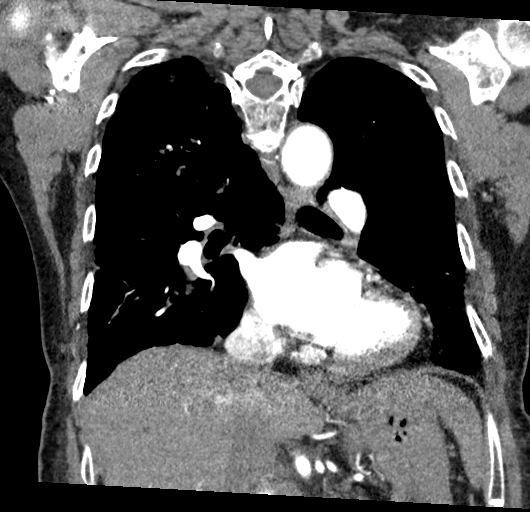

[17 of 36 positions shown; findings below may reference images not displayed]

RADIATION DOSE REDUCTION: This exam was performed according to the
departmental dose-optimization program which includes automated
exposure control, adjustment of the mA and/or kV according to
patient size and/or use of iterative reconstruction technique.

CONTRAST:  80mL OMNIPAQUE IOHEXOL 350 MG/ML SOLN
FINDINGS: Cardiovascular: Mild breathing motion artifact at the bases obscures
detailed assessment. Allowing for this, There are no filling defects
within the pulmonary arteries to suggest pulmonary embolus. No
aortic dissection or acute aortic findings. Mild aortic
atherosclerosis. Common origin of the brachiocephalic and left
common carotid artery, variant arch anatomy. Upper normal heart
size. No pericardial effusion. Scattered coronary artery
calcifications.

Mediastinum/Nodes: No enlarged mediastinal or hilar lymph nodes. No
esophageal thickening. No visualized thyroid nodule.

Lungs/Pleura: Mild emphysema. Chronic areas of mucoid impaction with
tubular density in the right middle lobe, stable from prior PET-CT.
Possible adjacent chain suture versus calcification. Additional
areas of mucoid impaction are new, particularly involving the left
lower lobe where there is associated postobstructive consolidation
in the lower most left lower lobe. Scattered tree-in-bud nodularity
in the superior segment of the right lower lobe is unchanged from
prior PET. Occasional areas of scarring, including the right lung
apex. No significant pleural effusion. No ascending/pulmonary edema.
No suspicious pulmonary mass.

Upper Abdomen: No acute upper abdominal findings.

Musculoskeletal: Compression fracture of T12 vertebra has progressed
from lumbar spine MRI on [DATE], now with 75% loss of height
anteriorly. There is also mild T11 superior endplate compression
fracture that is new. Heterogeneous appearance of marrow in the
thoracic spine. Small lucent lesion in the sternal body. Lucent
lesion in the left lateral eleventh rib, with possible pathologic
fracture. Lucent lesion in the posterior left seventh rib. Small
lucent lesion within lateral ninth rib. Bilateral breast implants.

Review of the MIP images confirms the above findings.
IMPRESSION: 1. No pulmonary embolus.
2. Chronic areas of mucoid impaction with tubular density in the
right middle lobe, stable from prior PET-CT. Additional areas of
mucoid impaction are new, particularly involving the left lower lobe
where there is associated postobstructive consolidation.
3. Heterogeneous appearance of marrow in the thoracic spine with
multiple lucent lesions, likely related to known multiple myeloma.
Lucent lesions in the left lateral eleventh and posterior left
seventh ribs, as well as ninth right rib.
4. Compression fracture of T12 vertebra has progressed from lumbar
spine MRI on [DATE], now with 75% loss of height anteriorly.
There is also mild T11 superior endplate compression fracture that
is new.

Aortic Atherosclerosis ([SV]-[SV]) and Emphysema ([SV]-[SV]).

## 2021-08-06 IMAGING — DX DG CHEST 1V PORT
1 series · 1 of 1 positions shown · non-contrast
Comparison: Radiographs [DATE] and [DATE].  CT [DATE].

CLINICAL DATA: Cough.  Recent chemotherapy.

EXAM:
PORTABLE CHEST 1 VIEW

[chest ap]
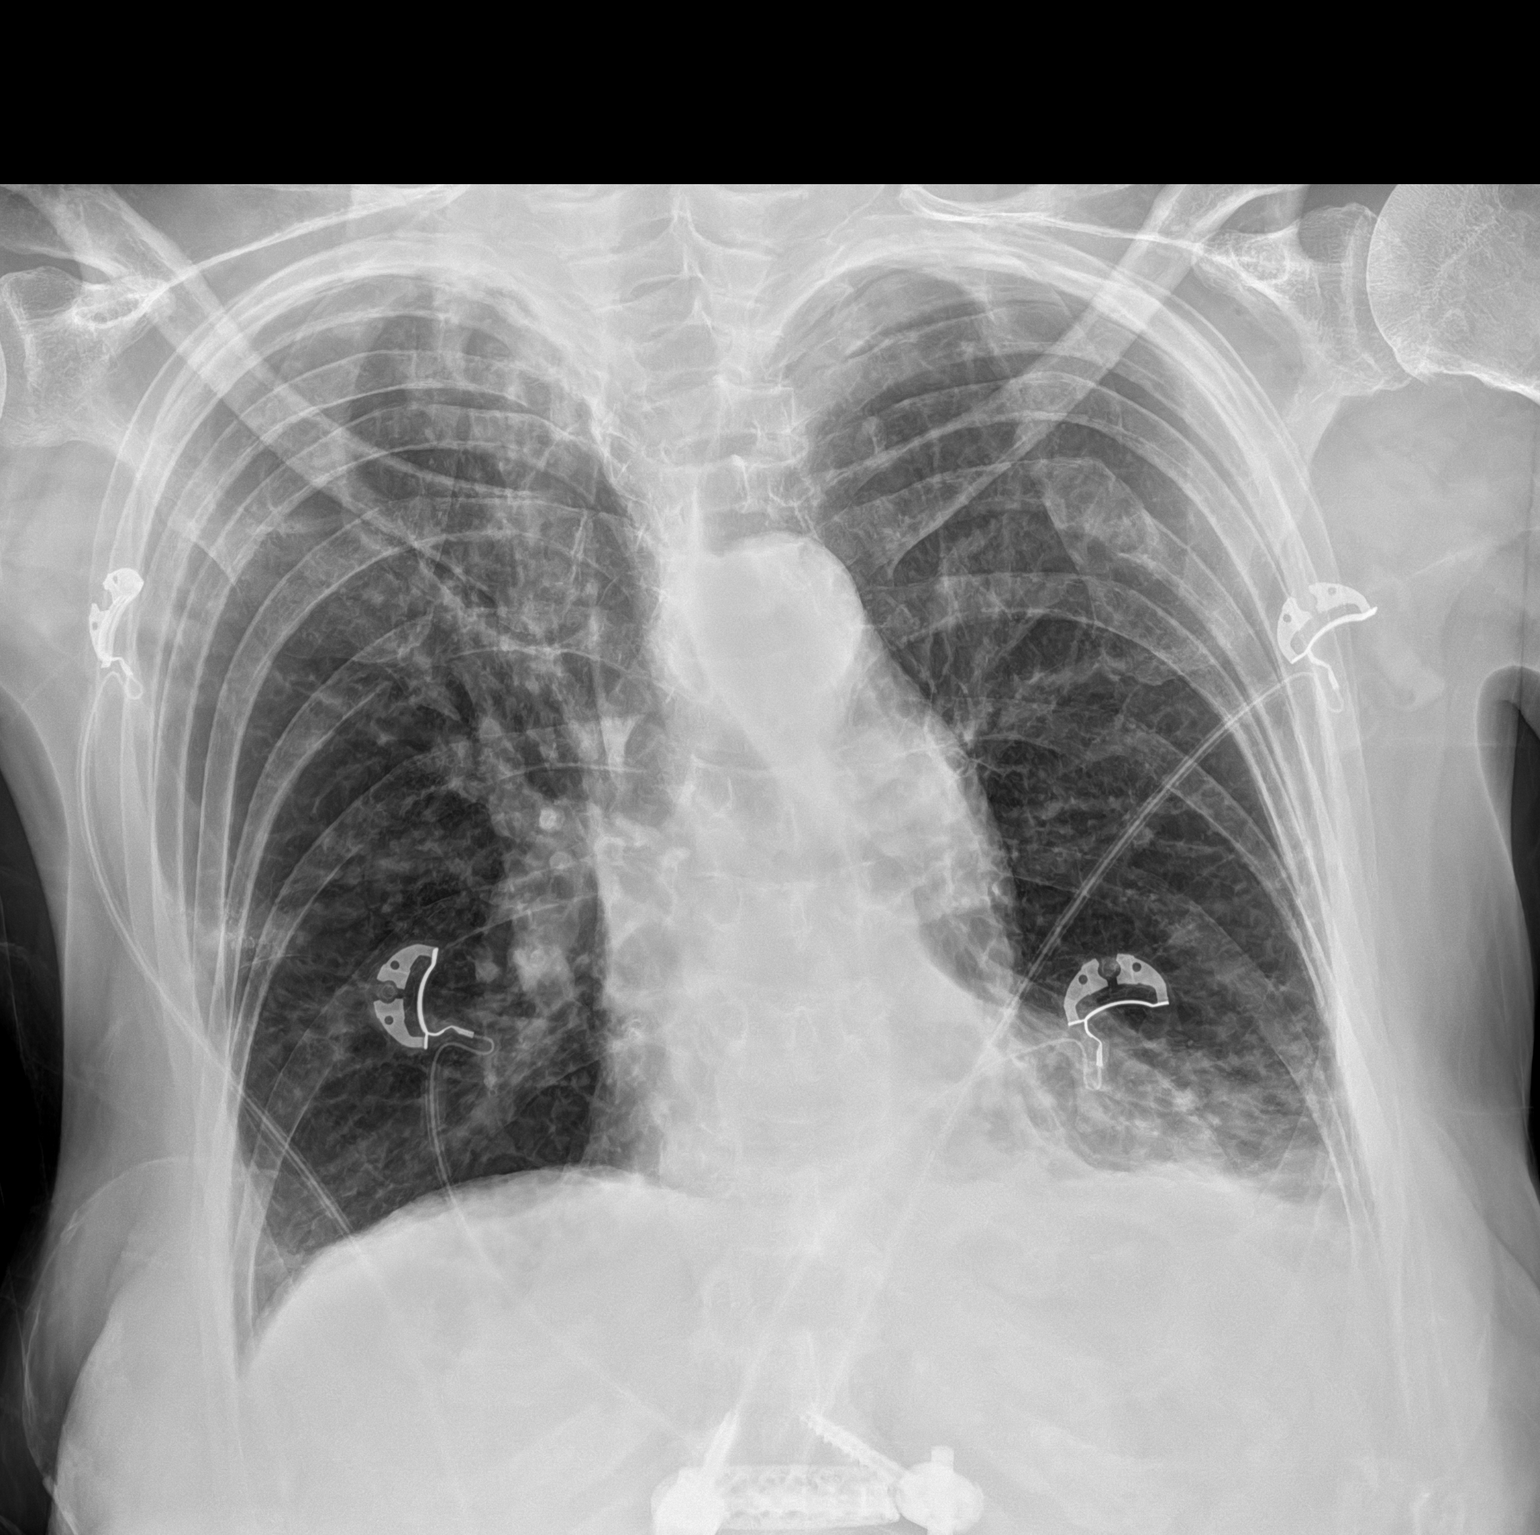

[1 of 1 positions shown; findings below may reference images not displayed]

FINDINGS: [ZY] hours. The heart size and mediastinal contours are stable with
aortic atherosclerosis. There is chronic lung disease with
asymmetric left basilar scarring, similar to previous studies. No
superimposed airspace disease, edema, pneumothorax or significant
pleural effusion identified. Old left-sided rib fractures, calcified
breast implants and previous lumbar fusion are noted.
IMPRESSION: Asymmetric left basilar scarring, similar to previous studies. No
evidence of acute cardiopulmonary process.

## 2021-08-06 MED ORDER — VANCOMYCIN HCL 1250 MG/250ML IV SOLN: 1250.0000 mg | Freq: Once | INTRAVENOUS | Status: DC

## 2021-08-06 MED ORDER — IOHEXOL 350 MG/ML SOLN
100.0000 mL | Freq: Once | INTRAVENOUS | Status: AC | PRN
  Administered 2021-08-06: 80 mL via INTRAVENOUS

## 2021-08-06 MED ORDER — HEPARIN SODIUM (PORCINE) 5000 UNIT/ML IJ SOLN
5000.0000 [IU] | Freq: Three times a day (TID) | INTRAMUSCULAR | Status: DC
  Administered 2021-08-06 – 2021-08-09 (×8): 5000 [IU] via SUBCUTANEOUS
  Filled 2021-08-06 (×8): qty 1

## 2021-08-06 MED ORDER — MORPHINE SULFATE (PF) 4 MG/ML IV SOLN
4.0000 mg | INTRAVENOUS | Status: DC | PRN
  Administered 2021-08-07: 02:00:00 4 mg via INTRAVENOUS
  Filled 2021-08-06 (×2): qty 1

## 2021-08-06 MED ORDER — SODIUM CHLORIDE 0.9 % IV BOLUS
500.0000 mL | Freq: Once | INTRAVENOUS | Status: AC
  Administered 2021-08-06: 500 mL via INTRAVENOUS

## 2021-08-06 MED ORDER — SODIUM CHLORIDE 0.9 % IV SOLN: INTRAVENOUS | Status: DC

## 2021-08-06 MED ORDER — PANTOPRAZOLE SODIUM 40 MG PO TBEC
40.0000 mg | DELAYED_RELEASE_TABLET | Freq: Every day | ORAL | Status: DC
  Administered 2021-08-07 – 2021-08-09 (×3): 40 mg via ORAL
  Filled 2021-08-06 (×3): qty 1

## 2021-08-06 MED ORDER — ACETAMINOPHEN 325 MG PO TABS: 650.0000 mg | ORAL_TABLET | Freq: Four times a day (QID) | ORAL | Status: DC | PRN

## 2021-08-06 MED ORDER — LORATADINE 10 MG PO TABS
10.0000 mg | ORAL_TABLET | Freq: Every day | ORAL | Status: DC
  Administered 2021-08-07 – 2021-08-09 (×3): 10 mg via ORAL
  Filled 2021-08-06 (×3): qty 1

## 2021-08-06 MED ORDER — LINACLOTIDE 145 MCG PO CAPS
145.0000 ug | ORAL_CAPSULE | Freq: Every day | ORAL | Status: DC
  Administered 2021-08-07: 10:00:00 145 ug via ORAL
  Filled 2021-08-06 (×3): qty 1

## 2021-08-06 MED ORDER — SODIUM CHLORIDE 0.9 % IV SOLN
2.0000 g | Freq: Once | INTRAVENOUS | Status: AC
  Administered 2021-08-06: 2 g via INTRAVENOUS
  Filled 2021-08-06: qty 2

## 2021-08-06 MED ORDER — BENZONATATE 100 MG PO CAPS
200.0000 mg | ORAL_CAPSULE | Freq: Three times a day (TID) | ORAL | Status: DC | PRN
  Administered 2021-08-07: 02:00:00 200 mg via ORAL
  Filled 2021-08-06: qty 2

## 2021-08-06 MED ORDER — ALBUTEROL SULFATE (2.5 MG/3ML) 0.083% IN NEBU: 3.0000 mL | INHALATION_SOLUTION | Freq: Four times a day (QID) | RESPIRATORY_TRACT | Status: DC | PRN

## 2021-08-06 MED ORDER — ONDANSETRON HCL 4 MG/2ML IJ SOLN
4.0000 mg | Freq: Once | INTRAMUSCULAR | Status: AC
  Administered 2021-08-06: 4 mg via INTRAVENOUS
  Filled 2021-08-06: qty 2

## 2021-08-06 MED ORDER — HYDROMORPHONE HCL 1 MG/ML IJ SOLN
1.0000 mg | Freq: Once | INTRAMUSCULAR | Status: AC
  Administered 2021-08-06: 1 mg via INTRAVENOUS
  Filled 2021-08-06: qty 1

## 2021-08-06 MED ORDER — ASPIRIN EC 81 MG PO TBEC
81.0000 mg | DELAYED_RELEASE_TABLET | Freq: Every day | ORAL | Status: DC
  Administered 2021-08-07 – 2021-08-09 (×3): 81 mg via ORAL
  Filled 2021-08-06 (×3): qty 1

## 2021-08-06 MED ORDER — POTASSIUM CHLORIDE 20 MEQ PO PACK
40.0000 meq | PACK | Freq: Once | ORAL | Status: AC
  Administered 2021-08-06: 40 meq via ORAL
  Filled 2021-08-06: qty 2

## 2021-08-06 MED ORDER — OXYCODONE HCL 5 MG PO TABS
5.0000 mg | ORAL_TABLET | ORAL | Status: DC | PRN
Start: 2021-08-06 — End: 2021-08-09
  Administered 2021-08-07 – 2021-08-09 (×9): 5 mg via ORAL
  Filled 2021-08-06 (×9): qty 1

## 2021-08-06 MED ORDER — ENSURE ENLIVE PO LIQD
237.0000 mL | Freq: Two times a day (BID) | ORAL | Status: DC
  Administered 2021-08-07 – 2021-08-08 (×4): 237 mL via ORAL

## 2021-08-06 MED ORDER — FLUTICASONE PROPIONATE 50 MCG/ACT NA SUSP
1.0000 | Freq: Every day | NASAL | Status: DC
  Administered 2021-08-07 – 2021-08-09 (×3): 1 via NASAL
  Filled 2021-08-06 (×2): qty 16

## 2021-08-06 NOTE — ED Triage Notes (Signed)
Recent chemo treatment, states she feels weak and has run a fever, dry hacking cough.

## 2021-08-06 NOTE — Telephone Encounter (Signed)
Husband called stating that patient has had fever, cough, SOB chest heaviness, weakness and is not eating or drinking much.  Advised to have her assessed in the ER.  Verbalized understanding.  Will follow up with them after visit.

## 2021-08-06 NOTE — ED Notes (Signed)
Purwick was placed by this NT @1630 

## 2021-08-06 NOTE — ED Provider Notes (Signed)
[facility] Provider Note   CSN: [id number] Arrival date & time: 08/06/21  1346     History  Chief Complaint  Patient presents with   Fatigue    Amanda Conner is a 71 y.o. female.  Patient actively being treated for multiple myeloma by the hematology oncology clinic here at [facility].  Last infusion was Thursday.  Since that time patient's had a cough sometimes dry sometimes productive.  Has feeling of fever and chills.  Afebrile here today.  Some shortness of breath some nausea and bilateral chest pain.  Also some pain in the lumbar back area.  And patient not eating very well.  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  According to her oncologist she is actively being treated currently.  Past medical history is significant for hypertension multiple myeloma hyperlipidemia known T12 compression fracture.  He has a history of the atypical tuberculosis.      Home Medications Prior to Admission medications   Medication Sig Start Date End Date Taking? Authorizing Provider  acyclovir (ZOVIRAX) 400 MG tablet Take 1 tablet (400 mg total) by mouth 2 (two) times daily. 07/19/21  Yes Derek Jack, MD  albuterol (VENTOLIN HFA) 108 (90 Base) MCG/ACT inhaler Inhale 2 puffs into the lungs every 6 (six) hours as needed for wheezing or shortness of breath.   Yes [provider]  aspirin EC 81 MG tablet Take 81 mg by mouth daily. Swallow whole.   Yes [provider]  azithromycin (ZITHROMAX) 250 MG tablet Take 250 mg by mouth daily.   Yes [provider]  Bedaquiline Fumarate (SIRTURO) 100 MG TABS Take 200 mg by mouth every Monday, Wednesday, and Friday. 07/03/20  Yes [provider]  benzonatate (TESSALON) 100 MG capsule Take by mouth 3 (three) times daily as needed for cough.   Yes [provider]  Biotin 1000 MCG tablet Take 1,000 mcg by mouth daily.   Yes [provider]  Calcium Carb-Cholecalciferol 600-800 MG-UNIT TABS Take 1 tablet by mouth daily.   Yes [provider]  daratumumab-hyaluronidase-fihj (DARZALEX FASPRO) 1800-30000 MG-UT/15ML SOLN Inject 1,800 mg into the skin once a week. 07/26/21  Yes [provider]  dexamethasone (DECADRON) 2 MG tablet Take 5 tablets (10 mg total) by mouth once a week. Patient taking differently: Take 10 mg by mouth once a week. Thursdays 07/19/21  Yes Derek Jack, MD  dextromethorphan-guaiFENesin [facility] DM) 30-600 MG 12hr tablet Take 1 tablet by mouth daily.    Yes [provider]  estrogen, conjugated,-medroxyprogesterone (PREMPRO) 0.45-1.5 MG tablet Take 1 tablet by mouth daily.   Yes [provider]  fluticasone (FLONASE) 50 MCG/ACT nasal spray Place 1 spray into both nostrils daily.   Yes [provider]  hydrochlorothiazide (HYDRODIURIL) 25 MG tablet Take 25 mg by mouth daily.   Yes [provider]  Lactulose 20 GM/30ML SOLN Take 30 ml by mouth every 3 hours until bowel movement is had, then take 30 ml daily 07/26/21  Yes Derek Jack, MD  latanoprost (XALATAN) 0.005 % ophthalmic solution Place 1 drop into both eyes at bedtime.    Yes [provider]  lenalidomide (REVLIMID) 20 MG capsule Take 1 capsule (20 mg total) by mouth daily. Take for 14 days, then hold for 7 days. Repeat every 21 days. 07/23/21  Yes Derek Jack,  MD  linaclotide (LINZESS) 145 MCG CAPS capsule Take 145 mcg by mouth daily before breakfast.   Yes [provider]  lisinopril (ZESTRIL) 20 MG tablet Take 20 mg by mouth daily.   Yes [provider]  loratadine (CLARITIN) 10 MG tablet Take 10 mg by mouth daily.   Yes [provider]  methocarbamol (ROBAXIN) 500 MG tablet Take 1 tablet (500 mg total) by mouth every 6 (six) hours as needed for muscle  spasms. 01/05/19  Yes Kristeen Miss, MD  omeprazole (PRILOSEC) 20 MG capsule Take 20 mg by mouth daily.   Yes [provider]  ondansetron (ZOFRAN ODT) 4 MG disintegrating tablet Take 1 tablet (4 mg total) by mouth every 4 (four) hours as needed for nausea or vomiting. 07/17/21  Yes Derek Jack, MD  pantoprazole (PROTONIX) 40 MG tablet Take 40 mg by mouth daily.   Yes [provider]  SUMAtriptan (IMITREX) 100 MG tablet Take 100 mg by mouth every 2 (two) hours as needed for migraine. May repeat in 2 hours if headache persists or recurs.   Yes [provider]  calcitonin, salmon, (MIACALCIN/FORTICAL) 200 UNIT/ACT nasal spray SMARTSIG:Both Nares 07/19/21   [provider]  oxyCODONE-acetaminophen (PERCOCET/ROXICET) 5-325 MG tablet Take 1 tablet by mouth every 6 (six) hours as needed for severe pain. Patient not taking: Reported on 08/06/2021 07/17/21   Derek Jack, MD      Allergies    Tobramycin-dexamethasone, Ethambutol, Neomycin-polymyxin-dexameth, Amikacin, Biaxin [clarithromycin], Cefoxitin, Sulfamethoxazole-trimethoprim, Vancomycin, and Bacitracin-polymyxin b    Review of Systems   Review of Systems  Constitutional:  Positive for chills, fatigue and fever.  HENT:  Positive for congestion. Negative for ear pain and sore throat.   Eyes:  Negative for pain and visual disturbance.  Respiratory:  Positive for cough and shortness of breath.   Cardiovascular:  Positive for chest pain. Negative for palpitations.  Gastrointestinal:  Negative for abdominal pain and vomiting.  Genitourinary:  Negative for dysuria and hematuria.  Musculoskeletal:  Positive for back pain. Negative for arthralgias.  Skin:  Negative for color change and rash.  Neurological:  Negative for seizures and syncope.  All other systems reviewed and are negative.  Physical Exam Updated Vital Signs BP 92/76    Pulse 73    Temp 98.1 F (36.7 C) (Oral)    Resp (!) 22    SpO2 95%   Physical Exam Vitals and nursing note reviewed.  Constitutional:      General: She is not in acute distress.    Appearance: Normal appearance. She is well-developed.  HENT:     Head: Normocephalic and atraumatic.     Mouth/Throat:     Mouth: Mucous membranes are moist.  Eyes:     Extraocular Movements: Extraocular movements intact.     Conjunctiva/sclera: Conjunctivae normal.     Pupils: Pupils are equal, round, and reactive to light.  Cardiovascular:     Rate and Rhythm: Normal rate and regular rhythm.     Heart sounds: No murmur heard. Pulmonary:     Effort: Pulmonary effort is normal. No respiratory distress.     Breath sounds: Normal breath sounds. No wheezing, rhonchi or rales.  Abdominal:     Palpations: Abdomen is soft.     Tenderness: There is no abdominal tenderness.  Musculoskeletal:        General: No swelling.     Cervical back: Normal range of motion and neck supple. No rigidity.  Skin:    General: Skin  is warm and dry.     Capillary Refill: Capillary refill takes less than 2 seconds.  Neurological:     General: No focal deficit present.     Mental Status: She is alert and oriented to person, place, and time.     Cranial Nerves: No cranial nerve deficit.     Sensory: No sensory deficit.  Psychiatric:        Mood and Affect: Mood normal.    ED Results / Procedures / Treatments   Labs (all labs ordered are listed, but only abnormal results are displayed) Labs Reviewed  CBC WITH DIFFERENTIAL/PLATELET - Abnormal; Notable for the following components:      Result Value   RBC 3.66 (*)    Hemoglobin 11.3 (*)    HCT 35.1 (*)    All other components within normal limits  COMPREHENSIVE METABOLIC PANEL - Abnormal; Notable for the following components:   Sodium 130 (*)    Potassium 3.3 (*)    Chloride 97 (*)    AST 13 (*)    All other components within normal limits  RESP PANEL BY RT-PCR (FLU A&B, COVID) ARPGX2  CULTURE, BLOOD (ROUTINE X 2)  CULTURE, BLOOD  (ROUTINE X 2)  LACTIC ACID, PLASMA  URINALYSIS, ROUTINE W REFLEX MICROSCOPIC  TROPONIN I (HIGH SENSITIVITY)  TROPONIN I (HIGH SENSITIVITY)    EKG EKG Interpretation  Date/Time:  Monday August 06 2021 15:28:25 EST Ventricular Rate:  84 PR Interval:  154 QRS Duration: 97 QT Interval:  383 QTC Calculation: 442 R Axis:   46 Text Interpretation: Sinus rhythm Aberrant conduction of SV complex(es) Probable left atrial enlargement Low voltage, extremity and precordial leads ST elevation, consider inferior injury Interpretation limited secondary to artifact Confirmed by Fredia Sorrow [phone]) on 08/06/2021 4:02:05 PM  Radiology CT Angio Chest PE W/Cm &/Or Wo Cm  Result Date: 08/06/2021 CLINICAL DATA:  Pulmonary embolism (PE) suspected, high prob Chest tightness, shortness of breath, fatigue and fever. Active chemotherapy for multiple myeloma. EXAM: CT ANGIOGRAPHY CHEST WITH CONTRAST TECHNIQUE: Multidetector CT imaging of the chest was performed using the standard protocol during bolus administration of intravenous contrast. Multiplanar CT image reconstructions and MIPs were obtained to evaluate the vascular anatomy. RADIATION DOSE REDUCTION: This exam was performed according to the departmental dose-optimization program which includes automated exposure control, adjustment of the mA and/or kV according to patient size and/or use of iterative reconstruction technique. CONTRAST:  93m OMNIPAQUE IOHEXOL 350 MG/ML SOLN COMPARISON:  Chest radiograph earlier today.  PET-CT 06/14/2009 FINDINGS: Cardiovascular: Mild breathing motion artifact at the bases obscures detailed assessment. Allowing for this, There are no filling defects within the pulmonary arteries to suggest pulmonary embolus. No aortic dissection or acute aortic findings. Mild aortic atherosclerosis. Common origin of the brachiocephalic and left common carotid artery, variant arch anatomy. Upper normal heart size. No pericardial effusion.  Scattered coronary artery calcifications. Mediastinum/Nodes: No enlarged mediastinal or hilar lymph nodes. No esophageal thickening. No visualized thyroid nodule. Lungs/Pleura: Mild emphysema. Chronic areas of mucoid impaction with tubular density in the right middle lobe, stable from prior PET-CT. Possible adjacent chain suture versus calcification. Additional areas of mucoid impaction are new, particularly involving the left lower lobe where there is associated postobstructive consolidation in the lower most left lower lobe. Scattered tree-in-bud nodularity in the superior segment of the right lower lobe is unchanged from prior PET. Occasional areas of scarring, including the right lung apex. No significant pleural effusion. No ascending/pulmonary edema. No suspicious pulmonary mass. Upper Abdomen:  No acute upper abdominal findings. Musculoskeletal: Compression fracture of T12 vertebra has progressed from lumbar spine MRI on 07/05/2021, now with 75% loss of height anteriorly. There is also mild T11 superior endplate compression fracture that is new. Heterogeneous appearance of marrow in the thoracic spine. Small lucent lesion in the sternal body. Lucent lesion in the left lateral eleventh rib, with possible pathologic fracture. Lucent lesion in the posterior left seventh rib. Small lucent lesion within lateral ninth rib. Bilateral breast implants. Review of the MIP images confirms the above findings. IMPRESSION: 1. No pulmonary embolus. 2. Chronic areas of mucoid impaction with tubular density in the right middle lobe, stable from prior PET-CT. Additional areas of mucoid impaction are new, particularly involving the left lower lobe where there is associated postobstructive consolidation. 3. Heterogeneous appearance of marrow in the thoracic spine with multiple lucent lesions, likely related to known multiple myeloma. Lucent lesions in the left lateral eleventh and posterior left seventh ribs, as well as ninth  right rib. 4. Compression fracture of T12 vertebra has progressed from lumbar spine MRI on 07/05/2021, now with 75% loss of height anteriorly. There is also mild T11 superior endplate compression fracture that is new. Aortic Atherosclerosis (ICD10-I70.0) and Emphysema (ICD10-J43.9). Electronically Signed   By: Keith Rake M.D.   On: 08/06/2021 19:14   DG Chest Port 1 View  Result Date: 08/06/2021 CLINICAL DATA:  Cough.  Recent chemotherapy. EXAM: PORTABLE CHEST 1 VIEW COMPARISON:  Radiographs 07/23/2021 and 06/25/2021.  CT 05/24/2021. FINDINGS: 1521 hours. The heart size and mediastinal contours are stable with aortic atherosclerosis. There is chronic lung disease with asymmetric left basilar scarring, similar to previous studies. No superimposed airspace disease, edema, pneumothorax or significant pleural effusion identified. Old left-sided rib fractures, calcified breast implants and previous lumbar fusion are noted. IMPRESSION: Asymmetric left basilar scarring, similar to previous studies. No evidence of acute cardiopulmonary process. Electronically Signed   By: Richardean Sale M.D.   On: 08/06/2021 15:39    Procedures Procedures    Medications Ordered in ED Medications  0.9 %  sodium chloride infusion ( Intravenous New Bag/Given 08/06/21 1626)  ceFEPIme (MAXIPIME) 1 g in sodium chloride 0.9 % 100 mL IVPB (has no administration in time range)  sodium chloride 0.9 % bolus 500 mL (500 mLs Intravenous New Bag/Given 08/06/21 1625)  HYDROmorphone (DILAUDID) injection 1 mg (1 mg Intravenous Given 08/06/21 1629)  ondansetron (ZOFRAN) injection 4 mg (4 mg Intravenous Given 08/06/21 1628)  iohexol (OMNIPAQUE) 350 MG/ML injection 100 mL (80 mLs Intravenous Contrast Given 08/06/21 1848)  HYDROmorphone (DILAUDID) injection 1 mg (1 mg Intravenous Given 08/06/21 2046)    ED Course/ Medical Decision Making/ A&P                           Medical Decision Making Amount and/or Complexity of Data  Reviewed Labs: ordered. Radiology: ordered.  Risk Prescription drug management. Decision regarding hospitalization.   CRITICAL CARE Performed by: Fredia Sorrow Total critical care time: 35 minutes Critical care time was exclusive of separately billable procedures and treating other patients. Critical care was necessary to treat or prevent imminent or life-threatening deterioration. Critical care was time spent personally by me on the following activities: development of treatment plan with patient and/or surrogate as well as nursing, discussions with consultants, evaluation of patient's response to treatment, examination of patient, obtaining history from patient or surrogate, ordering and performing treatments and interventions, ordering and review of laboratory studies, ordering  and review of radiographic studies, pulse oximetry and re-evaluation of patient's condition.   Patient tachypneic.  But not hypoxic.  Oxygen sats are in the mid 90s.  Respiratory rate up around 2426.  No fever here.  Not tachycardic.  In addition patient subjectively feels short of breath  Work-up significant for prominence x2 being normal.  COVID and and influenza testing negative.  Lactic acid normal at 1.1.  Blood cultures have been sent and are pending.  CBC unremarkable white blood cell count 6.1 hemoglobin 11.3.  Electrolytes not far off from baseline sodium 130 potassium down slightly at 3.3 renal function GFR is greater than 60.  Chest x-ray was somewhat equivocal CT angio chest was done with some significant findings.  Showed old and existing right middle lobe consolidation but had a new left lower lobe postobstructive consolidation in discussion with her oncologist Dr. Raliegh Ip we think this represents pneumonia in a immunocompromised patient.  We will start her on broad-spectrum antibiotics as if this could be HCAP.  It also showed evidence of worsening of the T12 compression fraction and is of some T11 compression  fracture and showed lesions in the ribs.  All could explain her pain.  No evidence of any pulmonary embolus.  Which clinically is what I was worried about.  EKG without any acute cardiac changes and.  And again her troponins x2 were normal.  Were going to get her admitted discussed with hospitalist service they will admit Final Clinical Impression(s) / ED Diagnoses Final diagnoses:  Multiple myeloma not having achieved remission [facility])  Community acquired pneumonia of left lower lobe of lung    Rx / DC Orders ED Discharge Orders     None         Fredia Sorrow, MD 08/06/21 2118

## 2021-08-07 DIAGNOSIS — C9000 Multiple myeloma not having achieved remission: Secondary | ICD-10-CM

## 2021-08-07 DIAGNOSIS — J189 Pneumonia, unspecified organism: Principal | ICD-10-CM

## 2021-08-07 DIAGNOSIS — E876 Hypokalemia: Secondary | ICD-10-CM | POA: Diagnosis present

## 2021-08-07 DIAGNOSIS — I10 Essential (primary) hypertension: Secondary | ICD-10-CM

## 2021-08-07 DIAGNOSIS — K219 Gastro-esophageal reflux disease without esophagitis: Secondary | ICD-10-CM

## 2021-08-07 DIAGNOSIS — S22000A Wedge compression fracture of unspecified thoracic vertebra, initial encounter for closed fracture: Secondary | ICD-10-CM | POA: Diagnosis not present

## 2021-08-07 LAB — COMPREHENSIVE METABOLIC PANEL
ALT: 11 U/L (ref 0–44)
AST: 10 U/L — ABNORMAL LOW (ref 15–41)
Albumin: 3.2 g/dL — ABNORMAL LOW (ref 3.5–5.0)
Alkaline Phosphatase: 74 U/L (ref 38–126)
Anion gap: 7 (ref 5–15)
BUN: 9 mg/dL (ref 8–23)
CO2: 25 mmol/L (ref 22–32)
Calcium: 8.4 mg/dL — ABNORMAL LOW (ref 8.9–10.3)
Chloride: 99 mmol/L (ref 98–111)
Creatinine, Ser: 0.54 mg/dL (ref 0.44–1.00)
GFR, Estimated: >60 mL/min (ref >60)
Glucose, Bld: 118 mg/dL — ABNORMAL HIGH (ref 70–99)
Potassium: 4.5 mmol/L (ref 3.5–5.1)
Sodium: 131 mmol/L — ABNORMAL LOW (ref 135–145)
Total Bilirubin: 0.5 mg/dL (ref 0.3–1.2)
Total Protein: 5.9 g/dL — ABNORMAL LOW (ref 6.5–8.1)

## 2021-08-07 LAB — CBC WITH DIFFERENTIAL/PLATELET
Abs Immature Granulocytes: 0.01 10*3/uL (ref 0.00–0.07)
Basophils Absolute: 0 10*3/uL (ref 0.0–0.1)
Basophils Relative: 0 %
Eosinophils Absolute: 0.3 10*3/uL (ref 0.0–0.5)
Eosinophils Relative: 6 %
HCT: 32.5 % — ABNORMAL LOW (ref 36.0–46.0)
Hemoglobin: 10.6 g/dL — ABNORMAL LOW (ref 12.0–15.0)
Immature Granulocytes: 0 %
Lymphocytes Relative: 14 %
Lymphs Abs: 0.7 10*3/uL (ref 0.7–4.0)
MCH: 31.2 pg (ref 26.0–34.0)
MCHC: 32.6 g/dL (ref 30.0–36.0)
MCV: 95.6 fL (ref 80.0–100.0)
Monocytes Absolute: 0.8 10*3/uL (ref 0.1–1.0)
Monocytes Relative: 15 %
Neutro Abs: 3.5 10*3/uL (ref 1.7–7.7)
Neutrophils Relative %: 65 %
Platelets: 254 10*3/uL (ref 150–400)
RBC: 3.4 MIL/uL — ABNORMAL LOW (ref 3.87–5.11)
RDW: 14.4 % (ref 11.5–15.5)
WBC: 5.4 10*3/uL (ref 4.0–10.5)
nRBC: 0 % (ref 0.0–0.2)

## 2021-08-07 LAB — MRSA NEXT GEN BY PCR, NASAL: MRSA by PCR Next Gen: NOT DETECTED

## 2021-08-07 LAB — MAGNESIUM: Magnesium: 1.9 mg/dL (ref 1.7–2.4)

## 2021-08-07 LAB — HIV ANTIBODY (ROUTINE TESTING W REFLEX): HIV Screen 4th Generation wRfx: NONREACTIVE

## 2021-08-07 MED ORDER — LINEZOLID 600 MG/300ML IV SOLN
600.0000 mg | Freq: Once | INTRAVENOUS | Status: AC
  Administered 2021-08-07: 03:00:00 600 mg via INTRAVENOUS
  Filled 2021-08-07 (×2): qty 300

## 2021-08-07 MED ORDER — SODIUM CHLORIDE 0.9 % IV SOLN: 2.0000 g | Freq: Three times a day (TID) | INTRAVENOUS | Status: DC

## 2021-08-07 MED ORDER — BENZONATATE 100 MG PO CAPS
200.0000 mg | ORAL_CAPSULE | Freq: Three times a day (TID) | ORAL | Status: DC
  Administered 2021-08-07 – 2021-08-09 (×6): 200 mg via ORAL
  Filled 2021-08-07 (×6): qty 2

## 2021-08-07 MED ORDER — SODIUM CHLORIDE 0.9 % IV BOLUS
500.0000 mL | Freq: Once | INTRAVENOUS | Status: AC
  Administered 2021-08-07: 02:00:00 500 mL via INTRAVENOUS

## 2021-08-07 MED ORDER — METHOCARBAMOL 500 MG PO TABS
500.0000 mg | ORAL_TABLET | Freq: Four times a day (QID) | ORAL | Status: DC | PRN
  Administered 2021-08-07 – 2021-08-08 (×2): 500 mg via ORAL
  Filled 2021-08-07 (×3): qty 1

## 2021-08-07 MED ORDER — SODIUM CHLORIDE 0.9 % IV SOLN
1.0000 g | Freq: Three times a day (TID) | INTRAVENOUS | Status: DC
  Administered 2021-08-07 – 2021-08-08 (×5): 1 g via INTRAVENOUS
  Filled 2021-08-07 (×5): qty 1

## 2021-08-07 NOTE — Progress Notes (Signed)
Initial Nutrition Assessment  DOCUMENTATION CODES:      INTERVENTION:  Ensure Enlive po BID, each supplement provides 350 kcal and 20 grams of protein   Recommend consider liberalizing diet to regular given pt poor appetite   Refer to [place] RD for close follow up due to malnutrition risk  NUTRITION DIAGNOSIS:   Increased nutrient needs related to cancer and cancer related treatments as evidenced by estimated needs.   GOAL:  Patient will meet greater than or equal to 90% of their needs   MONITOR:  PO intake, Supplement acceptance, Labs, Weight trends  REASON FOR ASSESSMENT:   Malnutrition Screening Tool    ASSESSMENT: Patient is a 71 yo female with hx of bronchiectasis, GERD, multiple myeloma (chemo). Presents with compression fracture (T12), cough, decreased appetite and pneumonia.   Patient is receiving a heart healthy/CHO mod diet and appetite is poor. Talked with nursing- patient picked at breakfast. Patient says her appetite has been diminished since starting chemo. She has been eating bland foods: ie rice cups, toast with butter and drinking mainly water. Encouraged patient to add sources of protein as able to support maintenance of muscle mass.  Patient weight is suprisingly stable at 60-61 kg. She is at high risk for weight loss given her current poor intake.   Medications reviewed and include: Linzess, Protonix. IVF-NS@ 100 ml/hr.   Labs: BMP Latest Ref Rng & Units 08/07/2021 08/06/2021 08/02/2021  Glucose 70 - 99 mg/dL 118(H) 97 111(H)  BUN 8 - 23 mg/dL '9 8 12  ' Creatinine 0.44 - 1.00 mg/dL 0.54 0.52 0.63  Sodium 135 - 145 mmol/L 131(L) 130(L) 131(L)  Potassium 3.5 - 5.1 mmol/L 4.5 3.3(L) 3.9  Chloride 98 - 111 mmol/L 99 97(L) 96(L)  CO2 22 - 32 mmol/L '25 24 26  ' Calcium 8.9 - 10.3 mg/dL 8.4(L) 8.9 8.8(L)      NUTRITION - FOCUSED PHYSICAL EXAM: Nutrition-Focused physical exam completed. Findings are mild orbital fat depletion, mild clavicle muscle  depletion, and no edema.     Diet Order:   Diet Order             Diet heart healthy/carb modified Room service appropriate? Yes; Fluid consistency: Thin  Diet effective now                   EDUCATION NEEDS:  Education needs have been addressed  Skin:  Skin Assessment: Reviewed RN Assessment  Last BM:  1/24 type 1  Height:   Ht Readings from Last 1 Encounters:  08/06/21 '5\' 5"'  (1.651 m)    Weight:   Wt Readings from Last 1 Encounters:  08/06/21 60 kg    Ideal Body Weight:   57 kg  BMI:  Body mass index is 22.01 kg/m.  Estimated Nutritional Needs:   Kcal:  1800-1900  Protein:  78-84 gr  Fluid:  1.8-1.9 liters daily   Colman Cater MS,RD,CSG,LDN Contact: Shea Evans

## 2021-08-07 NOTE — Progress Notes (Signed)
ASSUMPTION OF CARE NOTE   08/07/2021 4:10 PM  Amanda Conner was seen and examined.  The H&P by the admitting provider, orders, imaging was reviewed.  Please see new orders.  Will continue to follow.    Vitals:   08/07/21 0438 08/07/21 1305  BP: (!) 100/59 112/62  Pulse: 63 73  Resp: 19 16  Temp: 97.9 F (36.6 C) 98.5 F (36.9 C)  SpO2: 100% 97%    Results for orders placed or performed during the hospital encounter of 08/06/21  Resp Panel by RT-PCR (Flu A&B, Covid) Nasopharyngeal Swab   Specimen: Nasopharyngeal Swab; Nasopharyngeal(NP) swabs in vial transport medium  Result Value Ref Range   SARS Coronavirus 2 by RT PCR NEGATIVE NEGATIVE   Influenza A by PCR NEGATIVE NEGATIVE   Influenza B by PCR NEGATIVE NEGATIVE  Culture, blood (Routine X 2) w Reflex to ID Panel   Specimen: BLOOD LEFT FOREARM  Result Value Ref Range   Specimen Description BLOOD LEFT FOREARM    Special Requests      BOTTLES DRAWN AEROBIC AND ANAEROBIC Blood Culture adequate volume   Culture      NO GROWTH < 24 HOURS Performed at [facility], [address] [place], [place] [address]    Report Status PENDING   Culture, blood (Routine X 2) w Reflex to ID Panel   Specimen: Left Antecubital; Blood  Result Value Ref Range   Specimen Description LEFT ANTECUBITAL    Special Requests      BOTTLES DRAWN AEROBIC AND ANAEROBIC Blood Culture adequate volume   Culture      NO GROWTH < 24 HOURS Performed at [facility], [address], [place], [place] [address]    Report Status PENDING   CBC with Differential/Platelet  Result Value Ref Range   WBC 6.1 4.0 - 10.5 K/uL   RBC 3.66 (L) 3.87 - 5.11 MIL/uL   Hemoglobin 11.3 (L) 12.0 - 15.0 g/dL   HCT 35.1 (L) 36.0 - 46.0 %   MCV 95.9 80.0 - 100.0 fL   MCH 30.9 26.0 - 34.0 pg   MCHC 32.2 30.0 - 36.0 g/dL   RDW 14.3 11.5 - 15.5 %   Platelets 279 150 - 400 K/uL   nRBC 0.0 0.0 - 0.2 %   Neutrophils Relative % 77 %   Neutro Abs 4.7 1.7 - 7.7 K/uL    Lymphocytes Relative 11 %   Lymphs Abs 0.7 0.7 - 4.0 K/uL   Monocytes Relative 9 %   Monocytes Absolute 0.6 0.1 - 1.0 K/uL   Eosinophils Relative 3 %   Eosinophils Absolute 0.2 0.0 - 0.5 K/uL   Basophils Relative 0 %   Basophils Absolute 0.0 0.0 - 0.1 K/uL   Immature Granulocytes 0 %   Abs Immature Granulocytes 0.02 0.00 - 0.07 K/uL  Comprehensive metabolic panel  Result Value Ref Range   Sodium 130 (L) 135 - 145 mmol/L   Potassium 3.3 (L) 3.5 - 5.1 mmol/L   Chloride 97 (L) 98 - 111 mmol/L   CO2 24 22 - 32 mmol/L   Glucose, Bld 97 70 - 99 mg/dL   BUN 8 8 - 23 mg/dL   Creatinine, Ser 0.52 0.44 - 1.00 mg/dL   Calcium 8.9 8.9 - 10.3 mg/dL   Total Protein 6.9 6.5 - 8.1 g/dL   Albumin 3.7 3.5 - 5.0 g/dL   AST 13 (L) 15 - 41 U/L   ALT 14 0 - 44 U/L   Alkaline Phosphatase  81 38 - 126 U/L   Total Bilirubin 0.4 0.3 - 1.2 mg/dL   GFR, Estimated >60 >60 mL/min   Anion gap 9 5 - 15  Lactic acid, plasma  Result Value Ref Range   Lactic Acid, Venous 1.1 0.5 - 1.9 mmol/L  HIV Antibody (routine testing w rflx)  Result Value Ref Range   HIV Screen 4th Generation wRfx Non Reactive Non Reactive  Comprehensive metabolic panel  Result Value Ref Range   Sodium 131 (L) 135 - 145 mmol/L   Potassium 4.5 3.5 - 5.1 mmol/L   Chloride 99 98 - 111 mmol/L   CO2 25 22 - 32 mmol/L   Glucose, Bld 118 (H) 70 - 99 mg/dL   BUN 9 8 - 23 mg/dL   Creatinine, Ser 0.54 0.44 - 1.00 mg/dL   Calcium 8.4 (L) 8.9 - 10.3 mg/dL   Total Protein 5.9 (L) 6.5 - 8.1 g/dL   Albumin 3.2 (L) 3.5 - 5.0 g/dL   AST 10 (L) 15 - 41 U/L   ALT 11 0 - 44 U/L   Alkaline Phosphatase 74 38 - 126 U/L   Total Bilirubin 0.5 0.3 - 1.2 mg/dL   GFR, Estimated >60 >60 mL/min   Anion gap 7 5 - 15  Magnesium  Result Value Ref Range   Magnesium 1.9 1.7 - 2.4 mg/dL  CBC WITH DIFFERENTIAL  Result Value Ref Range   WBC 5.4 4.0 - 10.5 K/uL   RBC 3.40 (L) 3.87 - 5.11 MIL/uL   Hemoglobin 10.6 (L) 12.0 - 15.0 g/dL   HCT 32.5 (L) 36.0 - 46.0  %   MCV 95.6 80.0 - 100.0 fL   MCH 31.2 26.0 - 34.0 pg   MCHC 32.6 30.0 - 36.0 g/dL   RDW 14.4 11.5 - 15.5 %   Platelets 254 150 - 400 K/uL   nRBC 0.0 0.0 - 0.2 %   Neutrophils Relative % 65 %   Neutro Abs 3.5 1.7 - 7.7 K/uL   Lymphocytes Relative 14 %   Lymphs Abs 0.7 0.7 - 4.0 K/uL   Monocytes Relative 15 %   Monocytes Absolute 0.8 0.1 - 1.0 K/uL   Eosinophils Relative 6 %   Eosinophils Absolute 0.3 0.0 - 0.5 K/uL   Basophils Relative 0 %   Basophils Absolute 0.0 0.0 - 0.1 K/uL   Immature Granulocytes 0 %   Abs Immature Granulocytes 0.01 0.00 - 0.07 K/uL  Troponin I (High Sensitivity)  Result Value Ref Range   Troponin I (High Sensitivity) 6 <18 ng/L  Troponin I (High Sensitivity)  Result Value Ref Range   Troponin I (High Sensitivity) 6 <18 ng/L    C. Wynetta Emery, MD [facility]   08/06/2021  2:01 PM How to contact the [facility] Attending or Consulting provider [place] or covering provider during after hours [place], for this patient?  Check the care team in [facility] and look for a) attending/consulting [facility] provider listed and b) the [facility] team listed Log into [email or web address] and use [facility] universal password to access. If you do not have the password, please contact the hospital operator. Locate the [facility] provider you are looking for under [facility] and page to a number that you can be directly reached. If you still have difficulty reaching the provider, please page the [facility] (Director on Call) for the Hospitalists listed on amion for assistance.

## 2021-08-07 NOTE — H&P (Signed)
TRH H&P    Patient Demographics:    Amanda Conner, is a 71 y.o. female  MRN: [id number]  DOB - October 19, 1950  Admit Date - 08/06/2021  Referring MD/NP/PA: Rogene Houston  Outpatient Primary MD for the patient is Hungarland, Jenetta Downer, MD  Patient coming from: home  Chief complaint-cough   HPI:    Amanda Conner  is a 71 y.o. female, with history of bronchiectasis, chronic cough, GERD, glaucoma, hyperlipidemia, hypertension, multiple myeloma on chemo with last infusion Thursday, and history of cefepime resistant pseudomonal infection, presents the ED with a chief complaint of cough.  Patient reports that she has cough and weight on her chest.  She has had a decreased appetite.  She had chemo on Thursday and she is used to feeling rundown the day after chemo, but then she usually starts getting better.  This would be her third week of chemo.  After several days she still was not feeling better she called Dr. Raliegh Ip who suggested she come into the ED to get checked out.  Patient reports that her cough has been dry up until today.  She did produce some yellow thick sputum today.  The timing of the cough is constant with no worse at night or during the day.  Sometimes she has posttussis emesis that just appears as liquid because she has not been eating.  Her last normal meal was Saturday night.  She admits to fever and chills.  She had reports a T-max at home of 100.4.  She has not taken any Tylenol.  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  She had no successful treatment for it because she either has reactions to therapies or they do not work.  She reports she is following with infectious disease at [facility] who has her on an off label treatment and Zithromax.  Patient reports its been a couple of months and she is seeing her infectious disease specialist.  Patient also  complains of back pain.  On December 19 she was diagnosed with a compression fracture and started on oxycodone.  Patient reports that she was on oxycodone for 3 weeks and then started to wean herself off.  She thought maybe her pain will be getting better, but it actually got worse.  It is especially exacerbated by coughing.  In fact today's imaging shows worsening of her T12 fracture.  Patient reports constant constipation which she attributes to previous abdominal surgeries including a partial colectomy secondary to diverticulitis.  Patient has no other complaints at this time.  Patient does not smoke, does not drink, does not use illicit drugs.  She is vaccinated for COVID.  Patient is full code.  In the ED Temp 98.1, heart rate 67-87, respiratory rate 16-28, blood pressure 92/76, satting at 95% No leukocytosis with a white blood cell count of 6.1, hemoglobin 11.3, platelets 279 Chemistry panel reveals a slight hyponatremia which patient reports is chronic and 130, hypokalemia 3.3, hypochloremia 97 Troponin flat 6, 6 Lactic acid normal  1.1 CTA chest shows mucoid impaction stable on the right and new mucoid impaction in the left lower lobe.  Evidence of multiple myeloma which is known.  Worsening compression fracture T12. EKG shows a heart rate of 84, sinus rhythm, QTC 442 with aberrant conduction of SVT and baseline wander Since patient is immunocompromise she was started on vancomycin, cefepime she was given Dilaudid for pain and Zofran for nausea 500 mL bolus Admission was requested for further management of pneumonia and immunocompromise patient   Review of systems:    In addition to the HPI above,  To fever and chills No Headache, No changes with Vision or hearing, No problems swallowing food or Liquids, Patient admits to cough and heaviness in her chest No Abdominal pain, admits to nausea, vomiting, constipation No Blood in stool or Urine, No dysuria, No new skin rashes or  bruises, No new joints pains-aches,  No new weakness, tingling, numbness in any extremity, No recent weight gain or loss, No polyuria, polydypsia or polyphagia, No significant Mental Stressors.  All other systems reviewed and are negative.    Past History of the following :    Past Medical History:  Diagnosis Date   Arthritis    osteoarthritis   Back pain    Bronchiectasis ([place])    Cancer (HCC)    basal cell nose   Chronic cough    Complication of anesthesia    Dyspnea    Gastrointestinal symptoms    GERD (gastroesophageal reflux disease)    Glaucoma    History of hiatal hernia    Hyperlipemia    Hypertension    Osteopenia    Pneumonia    PONV (postoperative nausea and vomiting)    "only one time"   Spondylisthesis       Past Surgical History:  Procedure Laterality Date   ABDOMINAL EXPOSURE N/A 12/29/2018   Procedure: ABDOMINAL EXPOSURE;  Surgeon: Angelia Mould, MD;  Location: [facility] OR;  Service: Vascular;  Laterality: N/A;   ABDOMINOPLASTY  2002   ABDOMINOPLASTY  1970's   ANKLE SURGERY  10/2015   ANTERIOR LATERAL LUMBAR FUSION WITH PERCUTANEOUS SCREW 3 LEVEL Left 12/29/2018   Procedure: Lumbar Two to Lumbar Five Anterolateral lumbar interbody fusion;  Surgeon: Erline Levine, MD;  Location: [place];  Service: Neurosurgery;  Laterality: Left;  anterolateral   ANTERIOR LUMBAR FUSION N/A 12/29/2018   Procedure: Lumbar Five Sacral One Anterior lumbar interbody fusion;  Surgeon: Erline Levine, MD;  Location: [place];  Service: Neurosurgery;  Laterality: N/A;  anterior approach   BASAL CELL CARCINOMA EXCISION  06/2018   nose   BLEPHAROPLASTY Bilateral 1999   BREAST ENHANCEMENT SURGERY  1970's   COLONOSCOPY     CYSTOURETHROSCOPY  2018   Doble J ureteal stents   DECOMPRESSION CORE HIP Left 2014   FACIAL COSMETIC SURGERY  2004   FLUOROSCOPY GUIDANCE     HIP ARTHROPLASTY Left    INCISIONAL HERNIA REPAIR N/A 10/08/2019   Procedure: OPEN INCISIONAL HERNIA REPAIR WITH  MESH;  Surgeon: Armandina Gemma, MD;  Location: [facility];  Service: General;  Laterality: N/A;   JOINT REPLACEMENT Left    05/2014   LAPAROSCOPIC PARTIAL COLECTOMY  06/26/2017   LUMBAR PERCUTANEOUS PEDICLE SCREW 4 LEVEL N/A 12/29/2018   Procedure: Lumbar Percutaneous Pedicle Screw Placement Lumbar two-Sacral one;  Surgeon: Erline Levine, MD;  Location: [place];  Service: Neurosurgery;  Laterality: N/A;   MOHS SURGERY  2019   nose   PARTIAL COLECTOMY  06/2017  for diverticulitis   REMOVAL OF BILATERAL TISSUE EXPANDERS WITH PLACEMENT OF BILATERAL BREAST IMPLANTS  2004   THIGH LIFT  1994   TOE FUSION Right    great toe   TONSILLECTOMY     UMBILICAL HERNIA REPAIR     with tummy tuck   UMBILICAL HERNIA REPAIR  2002      Social History:      Social History   Tobacco Use   Smoking status: Never   Smokeless tobacco: Never  Substance Use Topics   Alcohol use: Yes    Comment: occasional       Family History :     Family History  Problem Relation Age of Onset   Hypertension Mother    COPD Mother    Hypertension Father    Heart disease Father       Home Medications:   Prior to Admission medications   Medication Sig Start Date End Date Taking? Authorizing Provider  acyclovir (ZOVIRAX) 400 MG tablet Take 1 tablet (400 mg total) by mouth 2 (two) times daily. 07/19/21  Yes Derek Jack, MD  albuterol (VENTOLIN HFA) 108 (90 Base) MCG/ACT inhaler Inhale 2 puffs into the lungs every 6 (six) hours as needed for wheezing or shortness of breath.   Yes [provider]  aspirin EC 81 MG tablet Take 81 mg by mouth daily. Swallow whole.   Yes [provider]  azithromycin (ZITHROMAX) 250 MG tablet Take 250 mg by mouth daily.   Yes [provider]  Bedaquiline Fumarate (SIRTURO) 100 MG TABS Take 200 mg by mouth every Monday, Wednesday, and Friday. 07/03/20  Yes [provider]  benzonatate (TESSALON) 100 MG capsule Take by mouth 3 (three) times daily  as needed for cough.   Yes [provider]  Biotin 1000 MCG tablet Take 1,000 mcg by mouth daily.   Yes [provider]  Calcium Carb-Cholecalciferol 600-800 MG-UNIT TABS Take 1 tablet by mouth daily.   Yes [provider]  daratumumab-hyaluronidase-fihj (DARZALEX FASPRO) 1800-30000 MG-UT/15ML SOLN Inject 1,800 mg into the skin once a week. 07/26/21  Yes [provider]  dexamethasone (DECADRON) 2 MG tablet Take 5 tablets (10 mg total) by mouth once a week. Patient taking differently: Take 10 mg by mouth once a week. Thursdays 07/19/21  Yes Derek Jack, MD  dextromethorphan-guaiFENesin [facility] DM) 30-600 MG 12hr tablet Take 1 tablet by mouth daily.    Yes [provider]  estrogen, conjugated,-medroxyprogesterone (PREMPRO) 0.45-1.5 MG tablet Take 1 tablet by mouth daily.   Yes [provider]  fluticasone (FLONASE) 50 MCG/ACT nasal spray Place 1 spray into both nostrils daily.   Yes [provider]  hydrochlorothiazide (HYDRODIURIL) 25 MG tablet Take 25 mg by mouth daily.   Yes [provider]  Lactulose 20 GM/30ML SOLN Take 30 ml by mouth every 3 hours until bowel movement is had, then take 30 ml daily 07/26/21  Yes Derek Jack, MD  latanoprost (XALATAN) 0.005 % ophthalmic solution Place 1 drop into both eyes at bedtime.    Yes [provider]  lenalidomide (REVLIMID) 20 MG capsule Take 1 capsule (20 mg total) by mouth daily. Take for 14 days, then hold for 7 days. Repeat every 21 days. 07/23/21  Yes Derek Jack, MD  linaclotide [facility]) 145 MCG CAPS capsule Take 145 mcg by mouth daily before breakfast.   Yes [provider]  lisinopril (ZESTRIL) 20 MG tablet Take 20 mg by mouth daily.   Yes [provider]  loratadine (CLARITIN) 10 MG tablet Take 10 mg by mouth daily.   Yes [provider]  methocarbamol (ROBAXIN) 500 MG tablet Take 1 tablet (500 mg total) by mouth  every 6 (six) hours as needed for muscle spasms. 01/05/19  Yes Kristeen Miss, MD  omeprazole (PRILOSEC) 20 MG capsule Take 20 mg by mouth daily.   Yes [provider]  ondansetron (ZOFRAN ODT) 4 MG disintegrating tablet Take 1 tablet (4 mg total) by mouth every 4 (four) hours as needed for nausea or vomiting. 07/17/21  Yes Derek Jack, MD  pantoprazole (PROTONIX) 40 MG tablet Take 40 mg by mouth daily.   Yes [provider]  SUMAtriptan (IMITREX) 100 MG tablet Take 100 mg by mouth every 2 (two) hours as needed for migraine. May repeat in 2 hours if headache persists or recurs.   Yes [provider]  calcitonin, salmon, (MIACALCIN/FORTICAL) 200 UNIT/ACT nasal spray SMARTSIG:Both Nares 07/19/21   [provider]  oxyCODONE-acetaminophen (PERCOCET/ROXICET) 5-325 MG tablet Take 1 tablet by mouth every 6 (six) hours as needed for severe pain. Patient not taking: Reported on 08/06/2021 07/17/21   Derek Jack, MD     Allergies:     Allergies  Allergen Reactions   Tobramycin-Dexamethasone Other (See Comments)    Itching and swelling   Ethambutol Other (See Comments)    Fever   Neomycin-Polymyxin-Dexameth Other (See Comments)    Itching and swelling   Amikacin Other (See Comments)    Eosinophilia with chills and aching when given with cefoxitin   Biaxin [Clarithromycin] Itching   Cefoxitin Other (See Comments)    Eosinophlia when given with amikacin   Sulfamethoxazole-Trimethoprim Itching   Vancomycin    Bacitracin-Polymyxin B Rash     Physical Exam:   Vitals  Blood pressure (!) 109/59, pulse 68, temperature 98.1 F (36.7 C), temperature source Oral, resp. rate 19, height $RemoveBe'5\' 5"'[id number]$  (1.651 m), weight 60 kg, SpO2 90 %.   1.  General: Patient lying supine in bed,  no acute distress   2. Psychiatric: Alert and oriented x 3, mood and behavior normal for situation, pleasant and cooperative with exam   3. Neurologic: Speech and language are  normal, face is symmetric, moves all 4 extremities voluntarily, at baseline without acute deficits on limited exam   4. HEENMT:  Head is atraumatic, normocephalic, pupils reactive to light, neck is supple, trachea is midline, mucous membranes are moist   5. Respiratory : Lungs are with rhonchi bilaterally, no wheezing, no crackles, no cyanosis, no increase in work of breathing or accessory muscle use, maintaining oxygen saturations on room air   6. Cardiovascular : Heart rate normal, rhythm is regular, no murmurs, rubs or gallops, no peripheral edema, peripheral pulses palpated   7. Gastrointestinal:  Abdomen is soft, nondistended, nontender to palpation bowel sounds active, no masses or organomegaly palpated   8. Skin:  Skin is warm, dry and intact without rashes, acute lesions, or ulcers on limited exam   9.Musculoskeletal:  No acute deformities or trauma, no asymmetry in tone, no peripheral edema, peripheral pulses palpated, no tenderness to palpation in the extremities     Data Review:    CBC Recent Labs  Lab 08/02/21 1254 08/06/21 1552  WBC 5.7 6.1  HGB 11.0* 11.3*  HCT 33.7* 35.1*  PLT 270 279  MCV 94.9 95.9  MCH 31.0 30.9  MCHC 32.6 32.2  RDW 14.2 14.3  LYMPHSABS 0.5* 0.7  MONOABS 0.2 0.6  EOSABS 0.4 0.2  BASOSABS 0.0 0.0   ------------------------------------------------------------------------------------------------------------------  Results for orders placed or performed during the hospital encounter of 08/06/21 (from the past 48 hour(s))  Resp Panel by RT-PCR (Flu A&B, Covid) Nasopharyngeal Swab     Status: None   Collection Time: 08/06/21  3:14 PM   Specimen: Nasopharyngeal Swab; Nasopharyngeal(NP) swabs in vial transport medium  Result Value Ref Range   SARS Coronavirus 2 by RT PCR NEGATIVE NEGATIVE    Comment: (NOTE) SARS-CoV-2 target nucleic acids are NOT DETECTED.  The SARS-CoV-2 RNA is generally detectable in upper respiratory specimens during  the acute phase of infection. The lowest concentration of SARS-CoV-2 viral copies this assay can detect is 138 copies/mL. A negative result does not preclude SARS-Cov-2 infection and should not be used as the sole basis for treatment or other patient management decisions. A negative result may occur with  improper specimen collection/handling, submission of specimen other than nasopharyngeal swab, presence of viral mutation(s) within the areas targeted by this assay, and inadequate number of viral copies(<138 copies/mL). A negative result must be combined with clinical observations, patient history, and epidemiological information. The expected result is Negative.  Fact Sheet for Patients:  EntrepreneurPulse.com.au  Fact Sheet for Healthcare Providers:  IncredibleEmployment.be  This test is no t yet approved or cleared by the [place] FDA and  has been authorized for detection and/or diagnosis of SARS-CoV-2 by FDA under an Emergency Use Authorization (EUA). This EUA will remain  in effect (meaning this test can be used) for the duration of the COVID-19 declaration under Section 564(b)(1) of the Act, 21 U.S.C.section 360bbb-3(b)(1), unless the authorization is terminated  or revoked sooner.       Influenza A by PCR NEGATIVE NEGATIVE   Influenza B by PCR NEGATIVE NEGATIVE    Comment: (NOTE) The Xpert Xpress SARS-CoV-2/FLU/RSV plus assay is intended as an aid in the diagnosis of influenza from Nasopharyngeal swab specimens and should not be used as a sole basis for treatment. Nasal washings and aspirates are unacceptable for Xpert Xpress SARS-CoV-2/FLU/RSV testing.  Fact Sheet for Patients: EntrepreneurPulse.com.au  Fact Sheet for Healthcare Providers: IncredibleEmployment.be  This test is not yet approved or cleared by the [place] FDA and has been authorized for detection and/or diagnosis of  SARS-CoV-2 by FDA under an Emergency Use Authorization (EUA). This EUA will remain in effect (meaning this test can be used) for the duration of the COVID-19 declaration under Section 564(b)(1) of the Act, 21 U.S.C. section 360bbb-3(b)(1), unless the authorization is terminated or revoked.  Performed at [facility], [address], [place], [place] [address]   CBC with Differential/Platelet     Status: Abnormal   Collection Time: 08/06/21  3:52 PM  Result Value Ref Range   WBC 6.1 4.0 - 10.5 K/uL   RBC 3.66 (L) 3.87 - 5.11 MIL/uL   Hemoglobin 11.3 (L) 12.0 - 15.0 g/dL   HCT 35.1 (L) 36.0 - 46.0 %   MCV 95.9 80.0 - 100.0 fL   MCH 30.9 26.0 - 34.0 pg   MCHC 32.2 30.0 - 36.0 g/dL   RDW 14.3 11.5 - 15.5 %   Platelets 279 150 - 400 K/uL   nRBC 0.0 0.0 - 0.2 %   Neutrophils Relative % 77 %   Neutro Abs 4.7 1.7 - 7.7 K/uL   Lymphocytes Relative 11 %   Lymphs Abs 0.7 0.7 - 4.0 K/uL   Monocytes Relative 9 %   Monocytes Absolute 0.6 0.1 - 1.0 K/uL   Eosinophils  Relative 3 %   Eosinophils Absolute 0.2 0.0 - 0.5 K/uL   Basophils Relative 0 %   Basophils Absolute 0.0 0.0 - 0.1 K/uL   Immature Granulocytes 0 %   Abs Immature Granulocytes 0.02 0.00 - 0.07 K/uL    Comment: Performed at [facility], [address], [place], [place] [address]  Comprehensive metabolic panel     Status: Abnormal   Collection Time: 08/06/21  3:52 PM  Result Value Ref Range   Sodium 130 (L) 135 - 145 mmol/L   Potassium 3.3 (L) 3.5 - 5.1 mmol/L   Chloride 97 (L) 98 - 111 mmol/L   CO2 24 22 - 32 mmol/L   Glucose, Bld 97 70 - 99 mg/dL    Comment: Glucose reference range applies only to samples taken after fasting for at least 8 hours.   BUN 8 8 - 23 mg/dL   Creatinine, Ser 0.52 0.44 - 1.00 mg/dL   Calcium 8.9 8.9 - 10.3 mg/dL   Total Protein 6.9 6.5 - 8.1 g/dL   Albumin 3.7 3.5 - 5.0 g/dL   AST 13 (L) 15 - 41 U/L   ALT 14 0 - 44 U/L   Alkaline Phosphatase 81 38 - 126 U/L   Total Bilirubin 0.4 0.3 - 1.2  mg/dL   GFR, Estimated >60 >60 mL/min    Comment: (NOTE) Calculated using the CKD-EPI Creatinine Equation (2021)    Anion gap 9 5 - 15    Comment: Performed at [facility], [address], [place], [place] [address]  Troponin I (High Sensitivity)     Status: None   Collection Time: 08/06/21  3:52 PM  Result Value Ref Range   Troponin I (High Sensitivity) 6 <18 ng/L    Comment: (NOTE) Elevated high sensitivity troponin I (hsTnI) values and significant  changes across serial measurements may suggest ACS but many other  chronic and acute conditions are known to elevate hsTnI results.  Refer to the "Links" section for chest pain algorithms and additional  guidance. Performed at [facility], [address], [place], [place] [address]   Lactic acid, plasma     Status: None   Collection Time: 08/06/21  4:00 PM  Result Value Ref Range   Lactic Acid, Venous 1.1 0.5 - 1.9 mmol/L    Comment: Performed at [facility], [address], [place], [place] [address]  Culture, blood (Routine X 2) w Reflex to ID Panel     Status: None (Preliminary result)   Collection Time: 08/06/21  4:00 PM   Specimen: Blood  Result Value Ref Range   Specimen Description BLOOD LEFT FOREARM    Special Requests      BOTTLES DRAWN AEROBIC AND ANAEROBIC Blood Culture adequate volume Performed at [facility], [address], [place], [place] [address]    Culture PENDING    Report Status PENDING   Culture, blood (Routine X 2) w Reflex to ID Panel     Status: None (Preliminary result)   Collection Time: 08/06/21  4:00 PM   Specimen: Blood  Result Value Ref Range   Specimen Description LEFT ANTECUBITAL    Special Requests      BOTTLES DRAWN AEROBIC AND ANAEROBIC Blood Culture adequate volume Performed at [facility], [address], [place], [place] [address]    Culture PENDING    Report Status PENDING   Troponin I (High Sensitivity)     Status: None   Collection Time: 08/06/21  5:31 PM  Result Value Ref  Range    Troponin I (High Sensitivity) 6 <18 ng/L    Comment: (NOTE) Elevated high sensitivity troponin I (hsTnI) values and significant  changes across serial measurements may suggest ACS but many other  chronic and acute conditions are known to elevate hsTnI results.  Refer to the "Links" section for chest pain algorithms and additional  guidance. Performed at [facility], [address] [place], [place] [address]     Chemistries  Recent Labs  Lab 08/02/21 1254 08/06/21 1552  NA 131* 130*  K 3.9 3.3*  CL 96* 97*  CO2 26 24  GLUCOSE 111* 97  BUN 12 8  CREATININE 0.63 0.52  CALCIUM 8.8* 8.9  MG 1.9  --   AST 16 13*  ALT 14 14  ALKPHOS 77 81  BILITOT 0.6 0.4   ------------------------------------------------------------------------------------------------------------------  ------------------------------------------------------------------------------------------------------------------ GFR: Estimated Creatinine Clearance: 58 mL/min (by C-G formula based on SCr of 0.52 mg/dL). Liver Function Tests: Recent Labs  Lab 08/02/21 1254 08/06/21 1552  AST 16 13*  ALT 14 14  ALKPHOS 77 81  BILITOT 0.6 0.4  PROT 7.5 6.9  ALBUMIN 3.9 3.7   No results for input(s): LIPASE, AMYLASE in the last 168 hours. No results for input(s): AMMONIA in the last 168 hours. Coagulation Profile: No results for input(s): INR, PROTIME in the last 168 hours. Cardiac Enzymes: No results for input(s): CKTOTAL, CKMB, CKMBINDEX, TROPONINI in the last 168 hours. BNP (last 3 results) No results for input(s): PROBNP in the last 8760 hours. HbA1C: No results for input(s): HGBA1C in the last 72 hours. CBG: No results for input(s): GLUCAP in the last 168 hours. Lipid Profile: No results for input(s): CHOL, HDL, LDLCALC, TRIG, CHOLHDL, LDLDIRECT in the last 72 hours. Thyroid Function Tests: No results for input(s): TSH, T4TOTAL, FREET4, T3FREE, THYROIDAB in the last 72 hours. Anemia Panel: No results  for input(s): VITAMINB12, FOLATE, FERRITIN, TIBC, IRON, RETICCTPCT in the last 72 hours.  --------------------------------------------------------------------------------------------------------------- Urine analysis:    Component Value Date/Time   COLORURINE YELLOW 07/02/2021 1036   APPEARANCEUR CLEAR 07/02/2021 1036   LABSPEC 1.016 07/02/2021 1036   PHURINE 6.0 07/02/2021 1036   GLUCOSEU NEGATIVE 07/02/2021 1036   HGBUR SMALL (A) 07/02/2021 1036   BILIRUBINUR NEGATIVE 07/02/2021 1036   KETONESUR NEGATIVE 07/02/2021 1036   PROTEINUR NEGATIVE 07/02/2021 1036   NITRITE NEGATIVE 07/02/2021 1036   LEUKOCYTESUR NEGATIVE 07/02/2021 1036      Imaging Results:    CT Angio Chest PE W/Cm &/Or Wo Cm  Result Date: 08/06/2021 CLINICAL DATA:  Pulmonary embolism (PE) suspected, high prob Chest tightness, shortness of breath, fatigue and fever. Active chemotherapy for multiple myeloma. EXAM: CT ANGIOGRAPHY CHEST WITH CONTRAST TECHNIQUE: Multidetector CT imaging of the chest was performed using the standard protocol during bolus administration of intravenous contrast. Multiplanar CT image reconstructions and MIPs were obtained to evaluate the vascular anatomy. RADIATION DOSE REDUCTION: This exam was performed according to the departmental dose-optimization program which includes automated exposure control, adjustment of the mA and/or kV according to patient size and/or use of iterative reconstruction technique. CONTRAST:  82m OMNIPAQUE IOHEXOL 350 MG/ML SOLN COMPARISON:  Chest radiograph earlier today.  PET-CT 06/14/2009 FINDINGS: Cardiovascular: Mild breathing motion artifact at the bases obscures detailed assessment. Allowing for this, There are no filling defects within the pulmonary arteries to suggest pulmonary embolus. No aortic dissection or acute aortic findings. Mild aortic atherosclerosis. Common origin of the brachiocephalic and left common carotid artery, variant arch anatomy. Upper normal heart  size.  No pericardial effusion. Scattered coronary artery calcifications. Mediastinum/Nodes: No enlarged mediastinal or hilar lymph nodes. No esophageal thickening. No visualized thyroid nodule. Lungs/Pleura: Mild emphysema. Chronic areas of mucoid impaction with tubular density in the right middle lobe, stable from prior PET-CT. Possible adjacent chain suture versus calcification. Additional areas of mucoid impaction are new, particularly involving the left lower lobe where there is associated postobstructive consolidation in the lower most left lower lobe. Scattered tree-in-bud nodularity in the superior segment of the right lower lobe is unchanged from prior PET. Occasional areas of scarring, including the right lung apex. No significant pleural effusion. No ascending/pulmonary edema. No suspicious pulmonary mass. Upper Abdomen: No acute upper abdominal findings. Musculoskeletal: Compression fracture of T12 vertebra has progressed from lumbar spine MRI on 07/05/2021, now with 75% loss of height anteriorly. There is also mild T11 superior endplate compression fracture that is new. Heterogeneous appearance of marrow in the thoracic spine. Small lucent lesion in the sternal body. Lucent lesion in the left lateral eleventh rib, with possible pathologic fracture. Lucent lesion in the posterior left seventh rib. Small lucent lesion within lateral ninth rib. Bilateral breast implants. Review of the MIP images confirms the above findings. IMPRESSION: 1. No pulmonary embolus. 2. Chronic areas of mucoid impaction with tubular density in the right middle lobe, stable from prior PET-CT. Additional areas of mucoid impaction are new, particularly involving the left lower lobe where there is associated postobstructive consolidation. 3. Heterogeneous appearance of marrow in the thoracic spine with multiple lucent lesions, likely related to known multiple myeloma. Lucent lesions in the left lateral eleventh and posterior left  seventh ribs, as well as ninth right rib. 4. Compression fracture of T12 vertebra has progressed from lumbar spine MRI on 07/05/2021, now with 75% loss of height anteriorly. There is also mild T11 superior endplate compression fracture that is new. Aortic Atherosclerosis (ICD10-I70.0) and Emphysema (ICD10-J43.9). Electronically Signed   By: Keith Rake M.D.   On: 08/06/2021 19:14   DG Chest Port 1 View  Result Date: 08/06/2021 CLINICAL DATA:  Cough.  Recent chemotherapy. EXAM: PORTABLE CHEST 1 VIEW COMPARISON:  Radiographs 07/23/2021 and 06/25/2021.  CT 05/24/2021. FINDINGS: 1521 hours. The heart size and mediastinal contours are stable with aortic atherosclerosis. There is chronic lung disease with asymmetric left basilar scarring, similar to previous studies. No superimposed airspace disease, edema, pneumothorax or significant pleural effusion identified. Old left-sided rib fractures, calcified breast implants and previous lumbar fusion are noted. IMPRESSION: Asymmetric left basilar scarring, similar to previous studies. No evidence of acute cardiopulmonary process. Electronically Signed   By: Richardean Sale M.D.   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 Raliegh Ip Dr. Raliegh Ip to see patient tomorrow T12 compression  fracture Worsening Pain control with Tylenol, Aloxi, morphine PT eval and treat Continue to monitor GERD Continue Protonix Hypokalemia Replace, recheck, also  check magnesium Hypertension Holding lisinopril and hydrochlorothiazide at this time as patient had a blood pressure 92/76 at admission    DVT Prophylaxis-   Heparin - SCDs   AM Labs Ordered, also please review Full Orders  Family Communication: No family at bedside  Code Status: Full  Admission status: Inpatient :The appropriate admission status for this patient is INPATIENT. Inpatient status is judged to be reasonable and necessary in order to provide the required intensity of service to ensure the patient's safety. The patient's presenting symptoms, physical exam findings, and initial radiographic and laboratory data in the context of their chronic comorbidities is felt to place them at high risk for further clinical deterioration. Furthermore, it is not anticipated that the patient will be medically stable for discharge from the hospital within 2 midnights of admission. The following factors support the admission status of inpatient.     The patient's presenting symptoms include cough. The worrisome physical exam findings include rhonchi. The initial radiographic and laboratory data are worrisome because of postobstructive pneumonia. The chronic co-morbidities include multiple myeloma, GERD, hypertension.       * I certify that at the point of admission it is my clinical judgment that the patient will require inpatient hospital care spanning beyond 2 midnights from the point of admission due to high intensity of service, high risk for further deterioration and high frequency of surveillance required.*  Disposition: Anticipated Discharge date 48 hours discharge to home  Time spent in minutes : [place] DO

## 2021-08-07 NOTE — Evaluation (Signed)
Physical Therapy Evaluation Patient Details Name: Amanda Conner MRN: [id number] DOB: 1950/10/09 Today's Date: 08/07/2021  History of Present Illness  Marcos Peloso  is a 71 y.o. female, with history of bronchiectasis, chronic cough, GERD, glaucoma, hyperlipidemia, hypertension, multiple myeloma on chemo with last infusion Thursday, and history of cefepime resistant pseudomonal infection, presents the ED with a chief complaint of cough.  Patient reports that she has cough and weight on her chest.  She has had a decreased appetite.  She had chemo on Thursday and she is used to feeling rundown the day after chemo, but then she usually starts getting better.  This would be her third week of chemo.  After several days she still was not feeling better she called Dr. Raliegh Ip who suggested she come into the ED to get checked out.  Patient reports that her cough has been dry up until today.  She did produce some yellow thick sputum today.  The timing of the cough is constant with no worse at night or during the day.  Sometimes she has posttussis emesis that just appears as liquid because she has not been eating.  Her last normal meal was Saturday night.  She admits to fever and chills.  She had reports a T-max at home of 100.4.  She has not taken any Tylenol.  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  She had no successful treatment for it because she either has reactions to therapies or they do not work.  She reports she is following with infectious disease at [facility] who has her on an off label treatment and Zithromax.  Patient reports its been a couple of months and she is seeing her infectious disease specialist.  Patient also complains of back pain.  On December 19 she was diagnosed with a compression fracture and started on oxycodone.  Patient reports that she was on oxycodone for 3 weeks and then started to wean herself off.  She thought maybe her  pain will be getting better, but it actually got worse.  It is especially exacerbated by coughing.  In fact today's imaging shows worsening of her T12 fracture.  Patient reports constant constipation which she attributes to previous abdominal surgeries including a partial colectomy secondary to diverticulitis.  Patient has no other complaints at this time.   Clinical Impression  Patient demonstrates increased time and labored movement for sitting up at bedside with [facility] raised with Supervision, good return for ambulating in room, transferring to/from commode in bathroom without loss of balance and limited mostly due to fatigue.  Patient requested to go back to bed after they due to fatigue.  Patient will benefit from continued skilled physical therapy in hospital and recommended venue below to increase strength, balance, endurance for safe ADLs and gait.       Recommendations for follow up therapy are one component of a multi-disciplinary discharge planning process, led by the attending physician.  Recommendations may be updated based on patient status, additional functional criteria and insurance authorization.  Follow Up Recommendations No PT follow up    Assistance Recommended at Discharge Set up Supervision/Assistance  Patient can return home with the following  A little help with walking and/or transfers;A little help with bathing/dressing/bathroom;Help with stairs or ramp for entrance;Assistance with cooking/housework    Equipment Recommendations None recommended by PT  Recommendations for Other Services       Functional Status Assessment Patient has had  a recent decline in their functional status and demonstrates the ability to make significant improvements in function in a reasonable and predictable amount of time.     Precautions / Restrictions Precautions Precautions: Fall Restrictions Weight Bearing Restrictions: No      Mobility  Bed Mobility Overal bed mobility: Needs  Assistance Bed Mobility: Supine to Sit     Supine to sit: HOB elevated, Supervision     General bed mobility comments: slow labored movement    Transfers Overall transfer level: Needs assistance Equipment used: Rolling walker (2 wheels) Transfers: Sit to/from Stand, Bed to chair/wheelchair/BSC Sit to Stand: Supervision   Step pivot transfers: Supervision       General transfer comment: increased time, labored movement, unsteady attempting transfers using SPC, safer using RW    Ambulation/Gait Ambulation/Gait assistance: Supervision, Min guard Gait Distance (Feet): 18 Feet Assistive device: Rolling walker (2 wheels) Gait Pattern/deviations: Decreased step length - right, Decreased step length - left, Decreased stride length Gait velocity: decreased     General Gait Details: slow slightly labored cadence with good return for using RW, no loss of balance, limited mostly due to fatigue and c/o generalized weakness  Stairs            Wheelchair Mobility    Modified Rankin (Stroke Patients Only)       Balance Overall balance assessment: Needs assistance Sitting-balance support: Feet supported, No upper extremity supported Sitting balance-Leahy Scale: Good Sitting balance - Comments: seated at EOB   Standing balance support: During functional activity, Reliant on assistive device for balance, Single extremity supported Standing balance-Leahy Scale: Poor Standing balance comment: fair/poor using SPC, fair/good using RW                             Pertinent Vitals/Pain Pain Assessment Pain Assessment: Faces Faces Pain Scale: Hurts even more Pain Location: mid back Pain Descriptors / Indicators: Sore Pain Intervention(s): Monitored during session, Limited activity within patient's tolerance, Repositioned, Premedicated before session    Home Living Family/patient expects to be discharged to:: Private residence Living Arrangements:  Spouse/significant other Available Help at Discharge: Family;Available 24 hours/day Type of Home: House Home Access: Stairs to enter   [employer] of Steps: 1   Home Layout: Able to live on main level with bedroom/bathroom;Full bath on main level;Two level Home Equipment: Conservation officer, nature (2 wheels);Shower seat;Hand held shower head;Wheelchair - manual;Cane - single point      Prior Function Prior Level of Function : Needs assist       Physical Assist : Mobility (physical) Mobility (physical): Bed mobility;Transfers;Gait;Stairs   Mobility Comments: household ambulator using SPC or pushing wheelchair ADLs Comments: assisted by family     Hand Dominance   Dominant Hand: Left    Extremity/Trunk Assessment   Upper Extremity Assessment Upper Extremity Assessment: Generalized weakness    Lower Extremity Assessment Lower Extremity Assessment: Generalized weakness    Cervical / Trunk Assessment Cervical / Trunk Assessment: Normal  Communication   Communication: No difficulties  Cognition Arousal/Alertness: Awake/alert Behavior During Therapy: WFL for tasks assessed/performed Overall Cognitive Status: Within Functional Limits for tasks assessed                                          General Comments      Exercises     Assessment/Plan  PT Assessment Patient needs continued PT services  PT Problem List Decreased activity tolerance;Decreased strength;Decreased balance;Decreased mobility       PT Treatment Interventions DME instruction;Gait training;Stair training;Functional mobility training;Therapeutic activities;Therapeutic exercise;Patient/family education;Balance training    PT Goals (Current goals can be found in the Care Plan section)  Acute Rehab PT Goals Patient Stated Goal: return home with family to assist PT Goal Formulation: With patient Time For Goal Achievement: 08/14/21 Potential to Achieve Goals: Good    Frequency  Min 3X/week     Co-evaluation               AM-PAC PT "6 Clicks" Mobility  Outcome Measure Help needed turning from your back to your side while in a flat bed without using bedrails?: None Help needed moving from lying on your back to sitting on the side of a flat bed without using bedrails?: A Little Help needed moving to and from a bed to a chair (including a wheelchair)?: A Little Help needed standing up from a chair using your arms (e.g., wheelchair or bedside chair)?: A Little Help needed to walk in hospital room?: A Little Help needed climbing 3-5 steps with a railing? : A Little 6 Click Score: 19    End of Session   Activity Tolerance: Patient tolerated treatment well;Patient limited by fatigue Patient left: in bed;with call bell/phone within reach Nurse Communication: Mobility status PT Visit Diagnosis: Unsteadiness on feet (R26.81);Other abnormalities of gait and mobility (R26.89);Muscle weakness (generalized) (M62.81)    Time: 7034-0352 PT Time Calculation (min) (ACUTE ONLY): 28 min   Charges:   PT Evaluation $PT Eval Moderate Complexity: 1 Mod PT Treatments $Therapeutic Activity: 23-37 mins        2:00 PM, 08/07/21 Lonell Grandchild, MPT Physical Therapist with [facility] [phone] office [phone] mobile phone

## 2021-08-07 NOTE — Consult Note (Signed)
[facility] Consultation Oncology  Name: Amanda Conner      MRN: [id number]    Location: A309/A309-01  Date: 08/07/2021 Time:6:21 PM   REFERRING PHYSICIAN: Dr. Rogene [place]  REASON FOR CONSULT: Multiple myeloma and pneumonia   HISTORY OF PRESENT ILLNESS: Amanda Conner is a 71 year old very pleasant white female who is seen in consultation today at the request of Dr. Rogene [place] for management of multiple myeloma.  This patient is known to me from office visits.  She was newly diagnosed with multiple myeloma and was started on treatment with Velcade, daratumumab and Revlimid on 07/26/2021.  She received day 8 of treatment on 08/02/2021.  She felt generalized weakness and had a temperature of 100.4 at home.  She also felt pains around the rib cage which is worse in the last few days.  She was also having a lot of pain when she is coughing.  She presented to the ER.  A CT scan PE protocol was negative.  However it showed left lower lobe infiltrates.  She was started on meropenem.  She has been afebrile.  She reports mild improvement in her breathing.  PAST MEDICAL HISTORY:   Past Medical History:  Diagnosis Date   Arthritis    osteoarthritis   Back pain    Bronchiectasis (HCC)    Cancer (HCC)    basal cell nose   Chronic cough    Complication of anesthesia    Dyspnea    Gastrointestinal symptoms    GERD (gastroesophageal reflux disease)    Glaucoma    History of hiatal hernia    Hyperlipemia    Hypertension    Osteopenia    Pneumonia    PONV (postoperative nausea and vomiting)    "only one time"   Spondylisthesis     ALLERGIES: Allergies  Allergen Reactions   Tobramycin-Dexamethasone Other (See Comments)    Itching and swelling   Ethambutol Other (See Comments)    Fever   Neomycin-Polymyxin-Dexameth Other (See Comments)    Itching and swelling   Amikacin Other (See Comments)    Eosinophilia with chills and aching when given with cefoxitin   Biaxin [Clarithromycin]  Itching   Cefoxitin Other (See Comments)    Eosinophlia when given with amikacin   Sulfamethoxazole-Trimethoprim Itching   Vancomycin    Bacitracin-Polymyxin B Rash      MEDICATIONS: I have reviewed the patient's current medications.     PAST SURGICAL HISTORY Past Surgical History:  Procedure Laterality Date   ABDOMINAL EXPOSURE N/A 12/29/2018   Procedure: ABDOMINAL EXPOSURE;  Surgeon: Angelia Mould, MD;  Location: [facility] OR;  Service: Vascular;  Laterality: N/A;   ABDOMINOPLASTY  2002   ABDOMINOPLASTY  1970's   ANKLE SURGERY  10/2015   ANTERIOR LATERAL LUMBAR FUSION WITH PERCUTANEOUS SCREW 3 LEVEL Left 12/29/2018   Procedure: Lumbar Two to Lumbar Five Anterolateral lumbar interbody fusion;  Surgeon: Erline Levine, MD;  Location: [place];  Service: Neurosurgery;  Laterality: Left;  anterolateral   ANTERIOR LUMBAR FUSION N/A 12/29/2018   Procedure: Lumbar Five Sacral One Anterior lumbar interbody fusion;  Surgeon: Erline Levine, MD;  Location: [place];  Service: Neurosurgery;  Laterality: N/A;  anterior approach   BASAL CELL CARCINOMA EXCISION  06/2018   nose   BLEPHAROPLASTY Bilateral 1999   BREAST ENHANCEMENT SURGERY  1970's   COLONOSCOPY     CYSTOURETHROSCOPY  2018   Doble J ureteal stents   DECOMPRESSION CORE HIP Left 2014   FACIAL COSMETIC SURGERY  2004  FLUOROSCOPY GUIDANCE     HIP ARTHROPLASTY Left    INCISIONAL HERNIA REPAIR N/A 10/08/2019   Procedure: OPEN INCISIONAL HERNIA REPAIR WITH MESH;  Surgeon: Armandina Gemma, MD;  Location: [facility];  Service: General;  Laterality: N/A;   JOINT REPLACEMENT Left    05/2014   LAPAROSCOPIC PARTIAL COLECTOMY  06/26/2017   LUMBAR PERCUTANEOUS PEDICLE SCREW 4 LEVEL N/A 12/29/2018   Procedure: Lumbar Percutaneous Pedicle Screw Placement Lumbar two-Sacral one;  Surgeon: Erline Levine, MD;  Location: [place];  Service: Neurosurgery;  Laterality: N/A;   MOHS SURGERY  2019   nose   PARTIAL COLECTOMY  06/2017   for diverticulitis   REMOVAL OF  BILATERAL TISSUE EXPANDERS WITH PLACEMENT OF BILATERAL BREAST IMPLANTS  2004   THIGH LIFT  1994   TOE FUSION Right    great toe   TONSILLECTOMY     UMBILICAL HERNIA REPAIR     with tummy tuck   UMBILICAL HERNIA REPAIR  2002    FAMILY HISTORY: Family History  Problem Relation Age of Onset   Hypertension Mother    COPD Mother    Hypertension Father    Heart disease Father     SOCIAL HISTORY:  reports that she has never smoked. She has never used smokeless tobacco. She reports current alcohol use. She reports that she does not use drugs.  PERFORMANCE STATUS: The patient's performance status is 2 - Symptomatic, <50% confined to bed  PHYSICAL EXAM: Most Recent Vital Signs: Blood pressure 112/62, pulse 73, temperature 98.5 F (36.9 C), temperature source Oral, resp. rate 16, height _0  (1.651 m), weight 132 lb 4.4 oz (60 kg), SpO2 97 %. BP 112/62 (BP Location: Right Arm)    Pulse 73    Temp 98.5 F (36.9 C) (Oral)    Resp 16    Ht _1  (1.651 m)    Wt 132 lb 4.4 oz (60 kg)    SpO2 97%    BMI 22.01 kg/m  General appearance: alert, cooperative, and appears stated age Extremities:  No edema or cyanosis. Neurologic: Grossly normal  LABORATORY DATA:  Results for orders placed or performed during the hospital encounter of 08/06/21 (from the past 48 hour(s))  Resp Panel by RT-PCR (Flu A&B, Covid) Nasopharyngeal Swab     Status: None   Collection Time: 08/06/21  3:14 PM   Specimen: Nasopharyngeal Swab; Nasopharyngeal(NP) swabs in vial transport medium  Result Value Ref Range   SARS Coronavirus 2 by RT PCR NEGATIVE NEGATIVE    Comment: (NOTE) SARS-CoV-2 target nucleic acids are NOT DETECTED.  The SARS-CoV-2 RNA is generally detectable in upper respiratory specimens during the acute phase of infection. The lowest concentration of SARS-CoV-2 viral copies this assay can detect is 138 copies/mL. A negative result does not preclude SARS-Cov-2 infection and should not be used as the  sole basis for treatment or other patient management decisions. A negative result may occur with  improper specimen collection/handling, submission of specimen other than nasopharyngeal swab, presence of viral mutation(s) within the areas targeted by this assay, and inadequate number of viral copies(<138 copies/mL). A negative result must be combined with clinical observations, patient history, and epidemiological information. The expected result is Negative.  Fact Sheet for Patients:  EntrepreneurPulse.com.au  Fact Sheet for Healthcare Providers:  IncredibleEmployment.be  This test is no t yet approved or cleared by the [place] FDA and  has been authorized for detection and/or diagnosis of SARS-CoV-2 by FDA under an Emergency Use Authorization (EUA).  This EUA will remain  in effect (meaning this test can be used) for the duration of the COVID-19 declaration under Section 564(b)(1) of the Act, 21 U.S.C.section 360bbb-3(b)(1), unless the authorization is terminated  or revoked sooner.       Influenza A by PCR NEGATIVE NEGATIVE   Influenza B by PCR NEGATIVE NEGATIVE    Comment: (NOTE) The Xpert Xpress SARS-CoV-2/FLU/RSV plus assay is intended as an aid in the diagnosis of influenza from Nasopharyngeal swab specimens and should not be used as a sole basis for treatment. Nasal washings and aspirates are unacceptable for Xpert Xpress SARS-CoV-2/FLU/RSV testing.  Fact Sheet for Patients: EntrepreneurPulse.com.au  Fact Sheet for Healthcare Providers: IncredibleEmployment.be  This test is not yet approved or cleared by the [place] FDA and has been authorized for detection and/or diagnosis of SARS-CoV-2 by FDA under an Emergency Use Authorization (EUA). This EUA will remain in effect (meaning this test can be used) for the duration of the COVID-19 declaration under Section 564(b)(1) of the Act, 21  U.S.C. section 360bbb-3(b)(1), unless the authorization is terminated or revoked.  Performed at [facility], [address], [place], [place] [address]   CBC with Differential/Platelet     Status: Abnormal   Collection Time: 08/06/21  3:52 PM  Result Value Ref Range   WBC 6.1 4.0 - 10.5 K/uL   RBC 3.66 (L) 3.87 - 5.11 MIL/uL   Hemoglobin 11.3 (L) 12.0 - 15.0 g/dL   HCT 35.1 (L) 36.0 - 46.0 %   MCV 95.9 80.0 - 100.0 fL   MCH 30.9 26.0 - 34.0 pg   MCHC 32.2 30.0 - 36.0 g/dL   RDW 14.3 11.5 - 15.5 %   Platelets 279 150 - 400 K/uL   nRBC 0.0 0.0 - 0.2 %   Neutrophils Relative % 77 %   Neutro Abs 4.7 1.7 - 7.7 K/uL   Lymphocytes Relative 11 %   Lymphs Abs 0.7 0.7 - 4.0 K/uL   Monocytes Relative 9 %   Monocytes Absolute 0.6 0.1 - 1.0 K/uL   Eosinophils Relative 3 %   Eosinophils Absolute 0.2 0.0 - 0.5 K/uL   Basophils Relative 0 %   Basophils Absolute 0.0 0.0 - 0.1 K/uL   Immature Granulocytes 0 %   Abs Immature Granulocytes 0.02 0.00 - 0.07 K/uL    Comment: Performed at [facility], [address], [place], [place] [address]  Comprehensive metabolic panel     Status: Abnormal   Collection Time: 08/06/21  3:52 PM  Result Value Ref Range   Sodium 130 (L) 135 - 145 mmol/L   Potassium 3.3 (L) 3.5 - 5.1 mmol/L   Chloride 97 (L) 98 - 111 mmol/L   CO2 24 22 - 32 mmol/L   Glucose, Bld 97 70 - 99 mg/dL    Comment: Glucose reference range applies only to samples taken after fasting for at least 8 hours.   BUN 8 8 - 23 mg/dL   Creatinine, Ser 0.52 0.44 - 1.00 mg/dL   Calcium 8.9 8.9 - 10.3 mg/dL   Total Protein 6.9 6.5 - 8.1 g/dL   Albumin 3.7 3.5 - 5.0 g/dL   AST 13 (L) 15 - 41 U/L   ALT 14 0 - 44 U/L   Alkaline Phosphatase 81 38 - 126 U/L   Total Bilirubin 0.4 0.3 - 1.2 mg/dL   GFR, Estimated >60 >60 mL/min    Comment: (NOTE) Calculated using the CKD-EPI Creatinine Equation (2021)    Anion gap  9 5 - 15    Comment: Performed at [facility], [address], [place], [place]  [address]  Troponin I (High Sensitivity)     Status: None   Collection Time: 08/06/21  3:52 PM  Result Value Ref Range   Troponin I (High Sensitivity) 6 <18 ng/L    Comment: (NOTE) Elevated high sensitivity troponin I (hsTnI) values and significant  changes across serial measurements may suggest ACS but many other  chronic and acute conditions are known to elevate hsTnI results.  Refer to the "Links" section for chest pain algorithms and additional  guidance. Performed at [facility], [address], [place], [place] [address]   Lactic acid, plasma     Status: None   Collection Time: 08/06/21  4:00 PM  Result Value Ref Range   Lactic Acid, Venous 1.1 0.5 - 1.9 mmol/L    Comment: Performed at [facility] [address], [place], [place] [address]  Culture, blood (Routine X 2) w Reflex to ID Panel     Status: None (Preliminary result)   Collection Time: 08/06/21  4:00 PM   Specimen: BLOOD LEFT FOREARM  Result Value Ref Range   Specimen Description BLOOD LEFT FOREARM    Special Requests      BOTTLES DRAWN AEROBIC AND ANAEROBIC Blood Culture adequate volume   Culture      NO GROWTH < 24 HOURS Performed at [facility], [address], [place], [place] [address]    Report Status PENDING   Culture, blood (Routine X 2) w Reflex to ID Panel     Status: None (Preliminary result)   Collection Time: 08/06/21  4:00 PM   Specimen: Left Antecubital; Blood  Result Value Ref Range   Specimen Description LEFT ANTECUBITAL    Special Requests      BOTTLES DRAWN AEROBIC AND ANAEROBIC Blood Culture adequate volume   Culture      NO GROWTH < 24 HOURS Performed at [facility], [address] [place], [place] [address]    Report Status PENDING   Troponin I (High Sensitivity)     Status: None   Collection Time: 08/06/21  5:31 PM  Result Value Ref Range   Troponin I (High Sensitivity) 6 <18 ng/L    Comment: (NOTE) Elevated high sensitivity troponin I (hsTnI) values and significant  changes across  serial measurements may suggest ACS but many other  chronic and acute conditions are known to elevate hsTnI results.  Refer to the "Links" section for chest pain algorithms and additional  guidance. Performed at [facility], [address], [place], [place] [address]   HIV Antibody (routine testing w rflx)     Status: None   Collection Time: 08/07/21  4:48 AM  Result Value Ref Range   HIV Screen 4th Generation wRfx Non Reactive Non Reactive    Comment: Performed at [facility], [place] [address], [place], [place] [address]  Comprehensive metabolic panel     Status: Abnormal   Collection Time: 08/07/21  4:48 AM  Result Value Ref Range   Sodium 131 (L) 135 - 145 mmol/L   Potassium 4.5 3.5 - 5.1 mmol/L    Comment: DELTA CHECK NOTED   Chloride 99 98 - 111 mmol/L   CO2 25 22 - 32 mmol/L   Glucose, Bld 118 (H) 70 - 99 mg/dL    Comment: Glucose reference range applies only to samples taken after fasting for at least 8 hours.   BUN 9 8 -  23 mg/dL   Creatinine, Ser 0.54 0.44 - 1.00 mg/dL   Calcium 8.4 (L) 8.9 - 10.3 mg/dL   Total Protein 5.9 (L) 6.5 - 8.1 g/dL   Albumin 3.2 (L) 3.5 - 5.0 g/dL   AST 10 (L) 15 - 41 U/L   ALT 11 0 - 44 U/L   Alkaline Phosphatase 74 38 - 126 U/L   Total Bilirubin 0.5 0.3 - 1.2 mg/dL   GFR, Estimated >60 >60 mL/min    Comment: (NOTE) Calculated using the CKD-EPI Creatinine Equation (2021)    Anion gap 7 5 - 15    Comment: Performed at [facility], [address], [place], [place] [address]  Magnesium     Status: None   Collection Time: 08/07/21  4:48 AM  Result Value Ref Range   Magnesium 1.9 1.7 - 2.4 mg/dL    Comment: Performed at [facility], [address], [place], [place] [address]  CBC WITH DIFFERENTIAL     Status: Abnormal   Collection Time: 08/07/21  4:48 AM  Result Value Ref Range   WBC 5.4 4.0 - 10.5 K/uL   RBC 3.40 (L) 3.87 - 5.11 MIL/uL   Hemoglobin 10.6 (L) 12.0 - 15.0 g/dL   HCT 32.5 (L) 36.0 - 46.0 %   MCV 95.6 80.0 - 100.0 fL    MCH 31.2 26.0 - 34.0 pg   MCHC 32.6 30.0 - 36.0 g/dL   RDW 14.4 11.5 - 15.5 %   Platelets 254 150 - 400 K/uL   nRBC 0.0 0.0 - 0.2 %   Neutrophils Relative % 65 %   Neutro Abs 3.5 1.7 - 7.7 K/uL   Lymphocytes Relative 14 %   Lymphs Abs 0.7 0.7 - 4.0 K/uL   Monocytes Relative 15 %   Monocytes Absolute 0.8 0.1 - 1.0 K/uL   Eosinophils Relative 6 %   Eosinophils Absolute 0.3 0.0 - 0.5 K/uL   Basophils Relative 0 %   Basophils Absolute 0.0 0.0 - 0.1 K/uL   Immature Granulocytes 0 %   Abs Immature Granulocytes 0.01 0.00 - 0.07 K/uL    Comment: Performed at [facility], [address], [place], [place] [address]  MRSA Next Gen by PCR, Nasal     Status: None   Collection Time: 08/07/21  4:10 PM   Specimen: Nasal Mucosa; Nasal Swab  Result Value Ref Range   MRSA by PCR Next Gen NOT DETECTED NOT DETECTED    Comment: (NOTE) The GeneXpert MRSA Assay (FDA approved for NASAL specimens only), is one component of a comprehensive MRSA colonization surveillance program. It is not intended to diagnose MRSA infection nor to guide or monitor treatment for MRSA infections. Test performance is not FDA approved in patients less than 36 years old. Performed at [facility], [address] [place],  [address]       RADIOGRAPHY: CT Angio Chest PE W/Cm &/Or Wo Cm  Result Date: 08/06/2021 CLINICAL DATA:  Pulmonary embolism (PE) suspected, high prob Chest tightness, shortness of breath, fatigue and fever. Active chemotherapy for multiple myeloma. EXAM: CT ANGIOGRAPHY CHEST WITH CONTRAST TECHNIQUE: Multidetector CT imaging of the chest was performed using the standard protocol during bolus administration of intravenous contrast. Multiplanar CT image reconstructions and MIPs were obtained to evaluate the vascular anatomy. RADIATION DOSE REDUCTION: This exam was performed according to the departmental dose-optimization program which includes automated exposure control, adjustment of the mA and/or kV  according to patient size and/or use of iterative reconstruction technique. CONTRAST:  73m OMNIPAQUE IOHEXOL 350 MG/ML SOLN COMPARISON:  Chest radiograph earlier today.  PET-CT 06/14/2009 FINDINGS: Cardiovascular: Mild breathing motion artifact at the bases obscures detailed assessment. Allowing for this, There are no filling defects within the pulmonary arteries to suggest pulmonary embolus. No aortic dissection or acute aortic findings. Mild aortic atherosclerosis. Common origin of the brachiocephalic and left common carotid artery, variant arch anatomy. Upper normal heart size. No pericardial effusion. Scattered coronary artery calcifications. Mediastinum/Nodes: No enlarged mediastinal or hilar lymph nodes. No esophageal thickening. No visualized thyroid nodule. Lungs/Pleura: Mild emphysema. Chronic areas of mucoid impaction with tubular density in the right middle lobe, stable from prior PET-CT. Possible adjacent chain suture versus calcification. Additional areas of mucoid impaction are new, particularly involving the left lower lobe where there is associated postobstructive consolidation in the lower most left lower lobe. Scattered tree-in-bud nodularity in the superior segment of the right lower lobe is unchanged from prior PET. Occasional areas of scarring, including the right lung apex. No significant pleural effusion. No ascending/pulmonary edema. No suspicious pulmonary mass. Upper Abdomen: No acute upper abdominal findings. Musculoskeletal: Compression fracture of T12 vertebra has progressed from lumbar spine MRI on 07/05/2021, now with 75% loss of height anteriorly. There is also mild T11 superior endplate compression fracture that is new. Heterogeneous appearance of marrow in the thoracic spine. Small lucent lesion in the sternal body. Lucent lesion in the left lateral eleventh rib, with possible pathologic fracture. Lucent lesion in the posterior left seventh rib. Small lucent lesion within lateral  ninth rib. Bilateral breast implants. Review of the MIP images confirms the above findings. IMPRESSION: 1. No pulmonary embolus. 2. Chronic areas of mucoid impaction with tubular density in the right middle lobe, stable from prior PET-CT. Additional areas of mucoid impaction are new, particularly involving the left lower lobe where there is associated postobstructive consolidation. 3. Heterogeneous appearance of marrow in the thoracic spine with multiple lucent lesions, likely related to known multiple myeloma. Lucent lesions in the left lateral eleventh and posterior left seventh ribs, as well as ninth right rib. 4. Compression fracture of T12 vertebra has progressed from lumbar spine MRI on 07/05/2021, now with 75% loss of height anteriorly. There is also mild T11 superior endplate compression fracture that is new. Aortic Atherosclerosis (ICD10-I70.0) and Emphysema (ICD10-J43.9). Electronically Signed   By: MKeith RakeM.D.   On: 08/06/2021 19:14   DG Chest Port 1 View  Result Date: 08/06/2021 CLINICAL DATA:  Cough.  Recent chemotherapy. EXAM: PORTABLE CHEST 1 VIEW COMPARISON:  Radiographs 07/23/2021 and 06/25/2021.  CT 05/24/2021. FINDINGS: 1521 hours. The heart size and mediastinal contours are stable with aortic atherosclerosis. There is chronic lung disease with asymmetric left basilar scarring, similar to previous studies. No superimposed airspace disease, edema, pneumothorax or significant pleural effusion identified. Old left-sided rib fractures, calcified breast implants and previous lumbar fusion are noted. IMPRESSION: Asymmetric left basilar scarring, similar to previous studies. No evidence of acute cardiopulmonary process. Electronically Signed   By: WRichardean SaleM.D.   On: 08/06/2021 15:39         ASSESSMENT and PLAN:  1.  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 -  We will hold her day 15 treatment on Thursday because of her infection.  Review of labs did not show any cytopenias. - I have also recommended her to stop taking Revlimid.  2.  Left lower lobe  pneumonia: - I have reviewed CT images which showed new left lower lobe consolidation compared to prior PET scan.  Chronic changes in the right middle lobe are stable. - Continue antibiotics. - She also has a history of atypical mycobacterial infection of left lung.  She reportedly stopped taking azithromycin and bedaquiline recently. - I have told her to start back on it.  She follows up with Dr. Lorenda Cahill at [facility].  3.  Lower back pain: - Previous MRI from 07/05/2021 showed mild superior endplate fracture of K84. - Current CT scan showed severe wedge compression of T12 vertebral body. - She was evaluated by her neurosurgeon in [place].  She will reach out to him to see if she is a candidate for vertebroplasty.   All questions were answered. The patient knows to call the clinic with any problems, questions or concerns. We can certainly see the patient much sooner if necessary.    Derek Jack

## 2021-08-07 NOTE — Progress Notes (Signed)
Oncology Discharge Planning Admission Note  [place] [facility] Address: [address], [place] [address] Hours of Operation:  8am - 5pm, Monday - Friday  Clinic Contact Information:  [phone]  Oncology Care Team: Medical Oncologist:  Dr. Derek Jack  Dr. Delton Coombes  is aware of this hospital admission dated 08/06/21.  Dr. Delton Coombes to assess patient at bedside today.  The cancer center will follow Caryl Comes inpatient care to assist with discharge planning as indicated by the oncologist.  We will reach out  closer to discharge date to arrange follow up care.  Disclaimer:  This [place] note does not imply a formal consult request has been made by the admitting attending for this admission or there will be an inpatient consult completed by oncology.  Please request oncology consults as per standard process as indicated.

## 2021-08-07 NOTE — Progress Notes (Addendum)
Pharmacy Antibiotic Note  Amanda Conner is a 71 y.o. female admitted on 08/06/2021 with pneumonia.  Pharmacy has been consulted for meropenem dosing.  Pt is immunocompromised w/ active chemotherapy.  Pt has complex ID hx.  Plan: Meropenem 1g IV Q8H. Linezolid 600mg  ordered x1 appropriately.  Height: 5\' 5"  (165.1 cm) Weight: 60 kg (132 lb 4.4 oz) IBW/kg (Calculated) : 57  Temp (24hrs), Avg:98.3 F (36.8 C), Min:98.1 F (36.7 C), Max:98.8 F (37.1 C)  Recent Labs  Lab 08/02/21 1254 08/06/21 1552 08/06/21 1600  WBC 5.7 6.1  --   CREATININE 0.63 0.52  --   LATICACIDVEN  --   --  1.1    Estimated Creatinine Clearance: 58 mL/min (by C-G formula based on SCr of 0.52 mg/dL).    Allergies  Allergen Reactions   Tobramycin-Dexamethasone Other (See Comments)    Itching and swelling   Ethambutol Other (See Comments)    Fever   Neomycin-Polymyxin-Dexameth Other (See Comments)    Itching and swelling   Amikacin Other (See Comments)    Eosinophilia with chills and aching when given with cefoxitin   Biaxin [Clarithromycin] Itching   Cefoxitin Other (See Comments)    Eosinophlia when given with amikacin   Sulfamethoxazole-Trimethoprim Itching   Vancomycin    Bacitracin-Polymyxin B Rash    Thank you for allowing pharmacy to be a part of this patients care.  Wynona Neat, PharmD, BCPS  08/07/2021 1:42 AM

## 2021-08-07 NOTE — Plan of Care (Signed)
°  Problem: Acute Rehab PT Goals(only PT should resolve) Goal: Pt Will Go Supine/Side To Sit Outcome: Progressing Flowsheets (Taken 08/07/2021 1402) Pt will go Supine/Side to Sit:  with modified independence  with supervision Goal: Patient Will Transfer Sit To/From Stand Outcome: Progressing Flowsheets (Taken 08/07/2021 1402) Patient will transfer sit to/from stand: with modified independence Goal: Pt Will Transfer Bed To Chair/Chair To Bed Outcome: Progressing Flowsheets (Taken 08/07/2021 1402) Pt will Transfer Bed to Chair/Chair to Bed: with modified independence Goal: Pt Will Ambulate Outcome: Progressing Flowsheets (Taken 08/07/2021 1402) Pt will Ambulate:  50 feet  with modified independence  with supervision  with rolling walker  with cane   2:03 PM, 08/07/21 Lonell Grandchild, MPT Physical Therapist with [facility] [phone] office [phone] mobile phone

## 2021-08-08 DIAGNOSIS — J189 Pneumonia, unspecified organism: Secondary | ICD-10-CM | POA: Diagnosis not present

## 2021-08-08 DIAGNOSIS — S22000A Wedge compression fracture of unspecified thoracic vertebra, initial encounter for closed fracture: Secondary | ICD-10-CM | POA: Diagnosis not present

## 2021-08-08 DIAGNOSIS — C9000 Multiple myeloma not having achieved remission: Secondary | ICD-10-CM | POA: Diagnosis not present

## 2021-08-08 LAB — PROCALCITONIN: Procalcitonin: <0.1 ng/mL

## 2021-08-08 MED ORDER — LEVOFLOXACIN 500 MG PO TABS
500.0000 mg | ORAL_TABLET | Freq: Every day | ORAL | Status: DC
  Administered 2021-08-08 – 2021-08-09 (×2): 500 mg via ORAL
  Filled 2021-08-08 (×2): qty 1

## 2021-08-08 MED ORDER — BEDAQUILINE FUMARATE 100 MG PO TABS
200.0000 mg | ORAL_TABLET | ORAL | Status: DC
  Administered 2021-08-08: 18:00:00 200 mg via ORAL

## 2021-08-08 MED ORDER — AZITHROMYCIN 250 MG PO TABS: 250.0000 mg | ORAL_TABLET | Freq: Every day | ORAL | Status: DC

## 2021-08-08 NOTE — Consult Note (Signed)
[facility] Oncology Progress Note  Name: Amanda Conner      MRN: [id number]    Location: A309/A309-01  Date: 08/08/2021 Time:6:21 PM   Subjective: Interval History:Amanda Conner is sitting up in the bed and looks more energetic.  She reports that she has been feeling better.  She still has coughing spells.  Pain is controlled well on the current regimen.  Antibiotics have been switched to Levaquin.  Objective: Vital signs in last 24 hours: Temp:  [98.6 F (37 C)-98.7 F (37.1 C)] 98.7 F (37.1 C) (01/25 1328) Pulse Rate:  [67-75] 75 (01/25 1328) Resp:  [17-19] 17 (01/25 1328) BP: (100-119)/(62-66) 112/66 (01/25 1328) SpO2:  [95 %-97 %] 95 % (01/25 1328)    Intake/Output from previous day: 01/24 0800 - 01/25 0759 In: 500 [P.O.:300] Out: -     Intake/Output this shift: Total I/O In: 560 [P.O.:560] Out: -    PHYSICAL EXAM: BP 112/66 (BP Location: Right Arm)    Pulse 75    Temp 98.7 F (37.1 C) (Oral)    Resp 17    Ht $R'5\' 5"'cl$  (1.651 m)    Wt 132 lb 4.4 oz (60 kg)    SpO2 95%    BMI 22.01 kg/m  General appearance: alert, cooperative, and appears stated age Neurologic: Grossly normal   Studies/Results: Results for orders placed or performed during the hospital encounter of 08/06/21 (from the past 48 hour(s))  HIV Antibody (routine testing w rflx)     Status: None   Collection Time: 08/07/21  4:48 AM  Result Value Ref Range   HIV Screen 4th Generation wRfx Non Reactive Non Reactive    Comment: Performed at [facility], [address] [place], [place] [address]  Comprehensive metabolic panel     Status: Abnormal   Collection Time: 08/07/21  4:48 AM  Result Value Ref Range   Sodium 131 (L) 135 - 145 mmol/L   Potassium 4.5 3.5 - 5.1 mmol/L    Comment: DELTA CHECK NOTED   Chloride 99 98 - 111 mmol/L   CO2 25 22 - 32 mmol/L   Glucose, Bld 118 (H) 70 - 99 mg/dL    Comment: Glucose reference range applies only to samples taken after fasting for at  least 8 hours.   BUN 9 8 - 23 mg/dL   Creatinine, Ser 0.54 0.44 - 1.00 mg/dL   Calcium 8.4 (L) 8.9 - 10.3 mg/dL   Total Protein 5.9 (L) 6.5 - 8.1 g/dL   Albumin 3.2 (L) 3.5 - 5.0 g/dL   AST 10 (L) 15 - 41 U/L   ALT 11 0 - 44 U/L   Alkaline Phosphatase 74 38 - 126 U/L   Total Bilirubin 0.5 0.3 - 1.2 mg/dL   GFR, Estimated >60 >60 mL/min    Comment: (NOTE) Calculated using the CKD-EPI Creatinine Equation (2021)    Anion gap 7 5 - 15    Comment: Performed at [facility], [address], [place], [place] [address]  Magnesium     Status: None   Collection Time: 08/07/21  4:48 AM  Result Value Ref Range   Magnesium 1.9 1.7 - 2.4 mg/dL    Comment: Performed at [facility], [address] [place], [place] [address]  CBC WITH DIFFERENTIAL     Status: Abnormal   Collection Time: 08/07/21  4:48 AM  Result Value Ref Range   WBC 5.4 4.0 - 10.5 K/uL   RBC 3.40 (L) 3.87 -  5.11 MIL/uL   Hemoglobin 10.6 (L) 12.0 - 15.0 g/dL   HCT 32.5 (L) 36.0 - 46.0 %   MCV 95.6 80.0 - 100.0 fL   MCH 31.2 26.0 - 34.0 pg   MCHC 32.6 30.0 - 36.0 g/dL   RDW 14.4 11.5 - 15.5 %   Platelets 254 150 - 400 K/uL   nRBC 0.0 0.0 - 0.2 %   Neutrophils Relative % 65 %   Neutro Abs 3.5 1.7 - 7.7 K/uL   Lymphocytes Relative 14 %   Lymphs Abs 0.7 0.7 - 4.0 K/uL   Monocytes Relative 15 %   Monocytes Absolute 0.8 0.1 - 1.0 K/uL   Eosinophils Relative 6 %   Eosinophils Absolute 0.3 0.0 - 0.5 K/uL   Basophils Relative 0 %   Basophils Absolute 0.0 0.0 - 0.1 K/uL   Immature Granulocytes 0 %   Abs Immature Granulocytes 0.01 0.00 - 0.07 K/uL    Comment: Performed at [facility], [address] [place], [place] [address]  Procalcitonin - Baseline     Status: None   Collection Time: 08/07/21  4:48 AM  Result Value Ref Range   Procalcitonin <0.10 ng/mL    Comment:        Interpretation: PCT (Procalcitonin) <= 0.5 ng/mL: Systemic infection (sepsis) is not likely. Local bacterial infection is possible. (NOTE)        Sepsis PCT Algorithm           Lower Respiratory Tract                                      Infection PCT Algorithm    ----------------------------     ----------------------------         PCT < 0.25 ng/mL                PCT < 0.10 ng/mL          Strongly encourage             Strongly discourage   discontinuation of antibiotics    initiation of antibiotics    ----------------------------     -----------------------------       PCT 0.25 - 0.50 ng/mL            PCT 0.10 - 0.25 ng/mL               OR       >80% decrease in PCT            Discourage initiation of                                            antibiotics      Encourage discontinuation           of antibiotics    ----------------------------     -----------------------------         PCT >= 0.50 ng/mL              PCT 0.26 - 0.50 ng/mL               AND        <80% decrease in PCT             Encourage initiation of  antibiotics       Encourage continuation           of antibiotics    ----------------------------     -----------------------------        PCT >= 0.50 ng/mL                  PCT > 0.50 ng/mL               AND         increase in PCT                  Strongly encourage                                      initiation of antibiotics    Strongly encourage escalation           of antibiotics                                     -----------------------------                                           PCT <= 0.25 ng/mL                                                 OR                                        > 80% decrease in PCT                                      Discontinue / Do not initiate                                             antibiotics  Performed at [facility], [address], [place], [place] [address]   MRSA Next Gen by PCR, Nasal     Status: None   Collection Time: 08/07/21  4:10 PM   Specimen: Nasal Mucosa; Nasal Swab  Result Value Ref Range   MRSA by  PCR Next Gen NOT DETECTED NOT DETECTED    Comment: (NOTE) The GeneXpert MRSA Assay (FDA approved for NASAL specimens only), is one component of a comprehensive MRSA colonization surveillance program. It is not intended to diagnose MRSA infection nor to guide or monitor treatment for MRSA infections. Test performance is not FDA approved in patients less than 70 years old. Performed at [facility], [address] [place], [place] [address]    CT Angio Chest PE W/Cm &/Or Wo Cm  Result Date: 08/06/2021 CLINICAL DATA:  Pulmonary embolism (PE) suspected, high prob Chest tightness, shortness of breath, fatigue and fever. Active chemotherapy for multiple myeloma. EXAM: CT ANGIOGRAPHY CHEST WITH CONTRAST TECHNIQUE: Multidetector CT imaging of  the chest was performed using the standard protocol during bolus administration of intravenous contrast. Multiplanar CT image reconstructions and MIPs were obtained to evaluate the vascular anatomy. RADIATION DOSE REDUCTION: This exam was performed according to the departmental dose-optimization program which includes automated exposure control, adjustment of the mA and/or kV according to patient size and/or use of iterative reconstruction technique. CONTRAST:  41mL OMNIPAQUE IOHEXOL 350 MG/ML SOLN COMPARISON:  Chest radiograph earlier today.  PET-CT 06/14/2009 FINDINGS: Cardiovascular: Mild breathing motion artifact at the bases obscures detailed assessment. Allowing for this, There are no filling defects within the pulmonary arteries to suggest pulmonary embolus. No aortic dissection or acute aortic findings. Mild aortic atherosclerosis. Common origin of the brachiocephalic and left common carotid artery, variant arch anatomy. Upper normal heart size. No pericardial effusion. Scattered coronary artery calcifications. Mediastinum/Nodes: No enlarged mediastinal or hilar lymph nodes. No esophageal thickening. No visualized thyroid nodule. Lungs/Pleura: Mild emphysema.  Chronic areas of mucoid impaction with tubular density in the right middle lobe, stable from prior PET-CT. Possible adjacent chain suture versus calcification. Additional areas of mucoid impaction are new, particularly involving the left lower lobe where there is associated postobstructive consolidation in the lower most left lower lobe. Scattered tree-in-bud nodularity in the superior segment of the right lower lobe is unchanged from prior PET. Occasional areas of scarring, including the right lung apex. No significant pleural effusion. No ascending/pulmonary edema. No suspicious pulmonary mass. Upper Abdomen: No acute upper abdominal findings. Musculoskeletal: Compression fracture of T12 vertebra has progressed from lumbar spine MRI on 07/05/2021, now with 75% loss of height anteriorly. There is also mild T11 superior endplate compression fracture that is new. Heterogeneous appearance of marrow in the thoracic spine. Small lucent lesion in the sternal body. Lucent lesion in the left lateral eleventh rib, with possible pathologic fracture. Lucent lesion in the posterior left seventh rib. Small lucent lesion within lateral ninth rib. Bilateral breast implants. Review of the MIP images confirms the above findings. IMPRESSION: 1. No pulmonary embolus. 2. Chronic areas of mucoid impaction with tubular density in the right middle lobe, stable from prior PET-CT. Additional areas of mucoid impaction are new, particularly involving the left lower lobe where there is associated postobstructive consolidation. 3. Heterogeneous appearance of marrow in the thoracic spine with multiple lucent lesions, likely related to known multiple myeloma. Lucent lesions in the left lateral eleventh and posterior left seventh ribs, as well as ninth right rib. 4. Compression fracture of T12 vertebra has progressed from lumbar spine MRI on 07/05/2021, now with 75% loss of height anteriorly. There is also mild T11 superior endplate compression  fracture that is new. Aortic Atherosclerosis (ICD10-I70.0) and Emphysema (ICD10-J43.9). Electronically Signed   By: Keith Rake M.D.   On: 08/06/2021 19:14     MEDICATIONS: I have reviewed the patient's current medications.     Assessment/Plan:  1.  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 - Will hold her day 15 treatment on 08/09/2021.  Revlimid is also on hold. - We will reevaluate her in the office next week.  2.  Left lower lobe pneumonia: - Antibiotics were changed to Levaquin today.  Blood cultures did not grow any in the last 2 days. 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  She was on prophylactic azithromycin and bedaquiline and follows with Dr. Lorenda Cahill at [facility]  3.  Back pain: -  MRI on 07/05/2021 showed mild superior endplate fracture of W90 with current CT showing severe wedge compression of T12 vertebral body. - She sees Dr. Baird Lyons of neurosurgery in [place].  She will follow-up with him to see if any vertebroplasty procedure can be done for better control of pain. - She reports better pain control on the current regimen. - We will consider starting her on bisphosphonates next week.   All questions were answered. The patient knows to call the clinic with any problems, questions or concerns. We can certainly see the patient much sooner if necessary.     Derek Jack

## 2021-08-08 NOTE — Progress Notes (Signed)
PROGRESS NOTE    Amanda Conner  PTW:[id number] DOB: February 04, 1951 DOA: 08/06/2021 PCP: Yvone Neu, MD   Brief Narrative:   Amanda Conner  is a 71 y.o. female, with history of bronchiectasis, chronic cough, GERD, glaucoma, hyperlipidemia, hypertension, multiple myeloma on chemo with last infusion Thursday, and history of cefepime resistant pseudomonal infection, presents the ED with a chief complaint of cough.  Patient was admitted for pneumonia in the setting of immunocompromise and was started on Merrem given prior history of cefepime resistant Pseudomonas.  She appears to overall be doing well.  Assessment & Plan:   Principal Problem:   Pneumonia Active Problems:   Multiple myeloma (HCC)   Hypokalemia   Compression fracture of body of thoracic vertebra (HCC)   GERD (gastroesophageal reflux disease)   Essential hypertension   Postobstructive pneumonia in the setting of immunocompromise -Discontinue Merrem and switch to oral Levaquin -Blood cultures negative x2 days -Sputum cultures pending  Multiple myeloma -Chemotherapy regimen per Dr. Delton Coombes  T12 compression fracture -Appears to be worsening -Follow-up with neurosurgery in outpatient setting -PT does not recommend follow-up at this time  GERD -PPI  Hypertension -Currently with soft BP readings, holding home antihypertensives  DVT prophylaxis: Heparin Code Status: Full Family Communication: None at bedside Disposition Plan:  Status is: Inpatient  Remains inpatient appropriate because: IV medications.   Consultants:  None  Procedures:  None  Antimicrobials:  Anti-infectives (From admission, onward)    Start     Dose/Rate Route Frequency Ordered Stop   08/08/21 1400  levofloxacin (LEVAQUIN) tablet 500 mg        500 mg Oral Daily 08/08/21 1301     08/07/21 0600  ceFEPIme (MAXIPIME) 2 g in sodium chloride 0.9 % 100 mL IVPB  Status:  Discontinued        2 g 200 mL/hr over 30 Minutes  Intravenous Every 8 hours 08/07/21 0144 08/07/21 0215   08/07/21 0400  meropenem (MERREM) 1 g in sodium chloride 0.9 % 100 mL IVPB  Status:  Discontinued        1 g 200 mL/hr over 30 Minutes Intravenous Every 8 hours 08/07/21 0217 08/08/21 1301   08/07/21 0230  linezolid (ZYVOX) IVPB 600 mg        600 mg 300 mL/hr over 60 Minutes Intravenous  Once 08/07/21 0130 08/07/21 1551   08/06/21 2200  vancomycin (VANCOREADY) IVPB 1250 mg/250 mL  Status:  Discontinued        1,250 mg 166.7 mL/hr over 90 Minutes Intravenous  Once 08/06/21 2130 08/07/21 0130   08/06/21 2100  ceFEPIme (MAXIPIME) 2 g in sodium chloride 0.9 % 100 mL IVPB        2 g 200 mL/hr over 30 Minutes Intravenous  Once 08/06/21 2046 08/07/21 1551       Subjective: Patient seen and evaluated today with no new acute complaints or concerns. No acute concerns or events noted overnight.  She states that she is overall breathing better and states as though she is feeling closer to her baseline compared to yesterday.  Objective: Vitals:   08/07/21 0438 08/07/21 1305 08/07/21 2114 08/08/21 0604  BP: (!) 100/59 112/62 100/62 119/62  Pulse: 63 73 67 67  Resp: '19 16 18 19  ' Temp: 97.9 F (36.6 C) 98.5 F (36.9 C) 98.7 F (37.1 C) 98.6 F (37 C)  TempSrc: Oral Oral Oral Oral  SpO2: 100% 97% 97% 97%  Weight:      Height:  Intake/Output Summary (Last 24 hours) at 08/08/2021 1302 Last data filed at 08/08/2021 0900 Gross per 24 hour  Intake 640 ml  Output --  Net 640 ml   Filed Weights   08/06/21 2200  Weight: 60 kg    Examination:  General exam: Appears calm and comfortable  Respiratory system: Clear to auscultation. Respiratory effort normal.  Currently on room air Cardiovascular system: S1 & S2 heard, RRR.  Gastrointestinal system: Abdomen is soft Central nervous system: Alert and awake Extremities: No edema Skin: No significant lesions noted Psychiatry: Flat affect.    Data Reviewed: I have personally  reviewed following labs and imaging studies  CBC: Recent Labs  Lab 08/02/21 1254 08/06/21 1552 08/07/21 0448  WBC 5.7 6.1 5.4  NEUTROABS 4.5 4.7 3.5  HGB 11.0* 11.3* 10.6*  HCT 33.7* 35.1* 32.5*  MCV 94.9 95.9 95.6  PLT 270 279 161   Basic Metabolic Panel: Recent Labs  Lab 08/02/21 1254 08/06/21 1552 08/07/21 0448  NA 131* 130* 131*  K 3.9 3.3* 4.5  CL 96* 97* 99  CO2 '26 24 25  ' GLUCOSE 111* 97 118*  BUN '12 8 9  ' CREATININE 0.63 0.52 0.54  CALCIUM 8.8* 8.9 8.4*  MG 1.9  --  1.9   GFR: Estimated Creatinine Clearance: 58 mL/min (by C-G formula based on SCr of 0.54 mg/dL). Liver Function Tests: Recent Labs  Lab 08/02/21 1254 08/06/21 1552 08/07/21 0448  AST 16 13* 10*  ALT '14 14 11  ' ALKPHOS 77 81 74  BILITOT 0.6 0.4 0.5  PROT 7.5 6.9 5.9*  ALBUMIN 3.9 3.7 3.2*   No results for input(s): LIPASE, AMYLASE in the last 168 hours. No results for input(s): AMMONIA in the last 168 hours. Coagulation Profile: No results for input(s): INR, PROTIME in the last 168 hours. Cardiac Enzymes: No results for input(s): CKTOTAL, CKMB, CKMBINDEX, TROPONINI in the last 168 hours. BNP (last 3 results) No results for input(s): PROBNP in the last 8760 hours. HbA1C: No results for input(s): HGBA1C in the last 72 hours. CBG: No results for input(s): GLUCAP in the last 168 hours. Lipid Profile: No results for input(s): CHOL, HDL, LDLCALC, TRIG, CHOLHDL, LDLDIRECT in the last 72 hours. Thyroid Function Tests: No results for input(s): TSH, T4TOTAL, FREET4, T3FREE, THYROIDAB in the last 72 hours. Anemia Panel: No results for input(s): VITAMINB12, FOLATE, FERRITIN, TIBC, IRON, RETICCTPCT in the last 72 hours. Sepsis Labs: Recent Labs  Lab 08/06/21 1600  LATICACIDVEN 1.1    Recent Results (from the past 240 hour(s))  Resp Panel by RT-PCR (Flu A&B, Covid) Nasopharyngeal Swab     Status: None   Collection Time: 08/06/21  3:14 PM   Specimen: Nasopharyngeal Swab; Nasopharyngeal(NP)  swabs in vial transport medium  Result Value Ref Range Status   SARS Coronavirus 2 by RT PCR NEGATIVE NEGATIVE Final    Comment: (NOTE) SARS-CoV-2 target nucleic acids are NOT DETECTED.  The SARS-CoV-2 RNA is generally detectable in upper respiratory specimens during the acute phase of infection. The lowest concentration of SARS-CoV-2 viral copies this assay can detect is 138 copies/mL. A negative result does not preclude SARS-Cov-2 infection and should not be used as the sole basis for treatment or other patient management decisions. A negative result may occur with  improper specimen collection/handling, submission of specimen other than nasopharyngeal swab, presence of viral mutation(s) within the areas targeted by this assay, and inadequate number of viral copies(<138 copies/mL). A negative result must be combined with clinical observations, patient history,  and epidemiological information. The expected result is Negative.  Fact Sheet for Patients:  EntrepreneurPulse.com.au  Fact Sheet for Healthcare Providers:  IncredibleEmployment.be  This test is no t yet approved or cleared by the [place] FDA and  has been authorized for detection and/or diagnosis of SARS-CoV-2 by FDA under an Emergency Use Authorization (EUA). This EUA will remain  in effect (meaning this test can be used) for the duration of the COVID-19 declaration under Section 564(b)(1) of the Act, 21 U.S.C.section 360bbb-3(b)(1), unless the authorization is terminated  or revoked sooner.       Influenza A by PCR NEGATIVE NEGATIVE Final   Influenza B by PCR NEGATIVE NEGATIVE Final    Comment: (NOTE) The Xpert Xpress SARS-CoV-2/FLU/RSV plus assay is intended as an aid in the diagnosis of influenza from Nasopharyngeal swab specimens and should not be used as a sole basis for treatment. Nasal washings and aspirates are unacceptable for Xpert Xpress  SARS-CoV-2/FLU/RSV testing.  Fact Sheet for Patients: EntrepreneurPulse.com.au  Fact Sheet for Healthcare Providers: IncredibleEmployment.be  This test is not yet approved or cleared by the [place] FDA and has been authorized for detection and/or diagnosis of SARS-CoV-2 by FDA under an Emergency Use Authorization (EUA). This EUA will remain in effect (meaning this test can be used) for the duration of the COVID-19 declaration under Section 564(b)(1) of the Act, 21 U.S.C. section 360bbb-3(b)(1), unless the authorization is terminated or revoked.  Performed at [facility], [address], [place], [place] [address]   Culture, blood (Routine X 2) w Reflex to ID Panel     Status: None (Preliminary result)   Collection Time: 08/06/21  4:00 PM   Specimen: BLOOD LEFT FOREARM  Result Value Ref Range Status   Specimen Description BLOOD LEFT FOREARM  Final   Special Requests   Final    BOTTLES DRAWN AEROBIC AND ANAEROBIC Blood Culture adequate volume   Culture   Final    NO GROWTH 2 DAYS Performed at [facility], [address] [place], [place] [address]    Report Status PENDING  Incomplete  Culture, blood (Routine X 2) w Reflex to ID Panel     Status: None (Preliminary result)   Collection Time: 08/06/21  4:00 PM   Specimen: Left Antecubital; Blood  Result Value Ref Range Status   Specimen Description LEFT ANTECUBITAL  Final   Special Requests   Final    BOTTLES DRAWN AEROBIC AND ANAEROBIC Blood Culture adequate volume   Culture   Final    NO GROWTH 2 DAYS Performed at [facility], [address], [place], [place] [address]    Report Status PENDING  Incomplete  MRSA Next Gen by PCR, Nasal     Status: None   Collection Time: 08/07/21  4:10 PM   Specimen: Nasal Mucosa; Nasal Swab  Result Value Ref Range Status   MRSA by PCR Next Gen NOT DETECTED NOT DETECTED Final    Comment: (NOTE) The GeneXpert MRSA Assay (FDA approved for NASAL  specimens only), is one component of a comprehensive MRSA colonization surveillance program. It is not intended to diagnose MRSA infection nor to guide or monitor treatment for MRSA infections. Test performance is not FDA approved in patients less than 64 years old. Performed at [facility], [address] [place],  [address]          Radiology Studies: CT Angio Chest PE W/Cm &/Or Wo Cm  Result Date: 08/06/2021 CLINICAL DATA:  Pulmonary embolism (PE) suspected, high  prob Chest tightness, shortness of breath, fatigue and fever. Active chemotherapy for multiple myeloma. EXAM: CT ANGIOGRAPHY CHEST WITH CONTRAST TECHNIQUE: Multidetector CT imaging of the chest was performed using the standard protocol during bolus administration of intravenous contrast. Multiplanar CT image reconstructions and MIPs were obtained to evaluate the vascular anatomy. RADIATION DOSE REDUCTION: This exam was performed according to the departmental dose-optimization program which includes automated exposure control, adjustment of the mA and/or kV according to patient size and/or use of iterative reconstruction technique. CONTRAST:  89m OMNIPAQUE IOHEXOL 350 MG/ML SOLN COMPARISON:  Chest radiograph earlier today.  PET-CT 06/14/2009 FINDINGS: Cardiovascular: Mild breathing motion artifact at the bases obscures detailed assessment. Allowing for this, There are no filling defects within the pulmonary arteries to suggest pulmonary embolus. No aortic dissection or acute aortic findings. Mild aortic atherosclerosis. Common origin of the brachiocephalic and left common carotid artery, variant arch anatomy. Upper normal heart size. No pericardial effusion. Scattered coronary artery calcifications. Mediastinum/Nodes: No enlarged mediastinal or hilar lymph nodes. No esophageal thickening. No visualized thyroid nodule. Lungs/Pleura: Mild emphysema. Chronic areas of mucoid impaction with tubular density in the right middle lobe,  stable from prior PET-CT. Possible adjacent chain suture versus calcification. Additional areas of mucoid impaction are new, particularly involving the left lower lobe where there is associated postobstructive consolidation in the lower most left lower lobe. Scattered tree-in-bud nodularity in the superior segment of the right lower lobe is unchanged from prior PET. Occasional areas of scarring, including the right lung apex. No significant pleural effusion. No ascending/pulmonary edema. No suspicious pulmonary mass. Upper Abdomen: No acute upper abdominal findings. Musculoskeletal: Compression fracture of T12 vertebra has progressed from lumbar spine MRI on 07/05/2021, now with 75% loss of height anteriorly. There is also mild T11 superior endplate compression fracture that is new. Heterogeneous appearance of marrow in the thoracic spine. Small lucent lesion in the sternal body. Lucent lesion in the left lateral eleventh rib, with possible pathologic fracture. Lucent lesion in the posterior left seventh rib. Small lucent lesion within lateral ninth rib. Bilateral breast implants. Review of the MIP images confirms the above findings. IMPRESSION: 1. No pulmonary embolus. 2. Chronic areas of mucoid impaction with tubular density in the right middle lobe, stable from prior PET-CT. Additional areas of mucoid impaction are new, particularly involving the left lower lobe where there is associated postobstructive consolidation. 3. Heterogeneous appearance of marrow in the thoracic spine with multiple lucent lesions, likely related to known multiple myeloma. Lucent lesions in the left lateral eleventh and posterior left seventh ribs, as well as ninth right rib. 4. Compression fracture of T12 vertebra has progressed from lumbar spine MRI on 07/05/2021, now with 75% loss of height anteriorly. There is also mild T11 superior endplate compression fracture that is new. Aortic Atherosclerosis (ICD10-I70.0) and Emphysema  (ICD10-J43.9). Electronically Signed   By: MKeith RakeM.D.   On: 08/06/2021 19:14   DG Chest Port 1 View  Result Date: 08/06/2021 CLINICAL DATA:  Cough.  Recent chemotherapy. EXAM: PORTABLE CHEST 1 VIEW COMPARISON:  Radiographs 07/23/2021 and 06/25/2021.  CT 05/24/2021. FINDINGS: 1521 hours. The heart size and mediastinal contours are stable with aortic atherosclerosis. There is chronic lung disease with asymmetric left basilar scarring, similar to previous studies. No superimposed airspace disease, edema, pneumothorax or significant pleural effusion identified. Old left-sided rib fractures, calcified breast implants and previous lumbar fusion are noted. IMPRESSION: Asymmetric left basilar scarring, similar to previous studies. No evidence of acute cardiopulmonary process. Electronically Signed  By: Richardean Sale M.D.   On: 08/06/2021 15:39        Scheduled Meds:  aspirin EC  81 mg Oral Daily   benzonatate  200 mg Oral TID   feeding supplement  237 mL Oral BID BM   fluticasone  1 spray Each Nare Daily   heparin  5,000 Units Subcutaneous Q8H   levofloxacin  500 mg Oral Daily   linaclotide  145 mcg Oral QAC breakfast   loratadine  10 mg Oral Daily   pantoprazole  40 mg Oral Daily     LOS: 2 days    Time spent: 35 minutes    Vassie Kugel Darleen Crocker, DO [facility]  If 7PM-7AM, please contact night-coverage [email or web address] 08/08/2021, 1:02 PM

## 2021-08-08 NOTE — Progress Notes (Signed)
PT Cancellation Note  Patient Details Name: Amanda Conner MRN: [id number] DOB: [birth year]   Cancelled Treatment:    Reason Eval/Treat Not Completed: Patient declined, no reason specified Attempted PT session, pt stated she does not feel ready at this time.  Pt left in bed with call bell within reach.  Ihor Austin, LPTA/CLT; CBIS [phone]  Aldona Lento 08/08/2021, 10:43 AM

## 2021-08-09 ENCOUNTER — Encounter: Payer: Self-pay | Admitting: *Deleted

## 2021-08-09 ENCOUNTER — Other Ambulatory Visit (HOSPITAL_COMMUNITY): Payer: Self-pay

## 2021-08-09 ENCOUNTER — Ambulatory Visit (HOSPITAL_COMMUNITY): Payer: Medicare Other

## 2021-08-09 ENCOUNTER — Inpatient Hospital Stay (HOSPITAL_COMMUNITY): Payer: Medicare Other

## 2021-08-09 ENCOUNTER — Ambulatory Visit (HOSPITAL_COMMUNITY): Payer: Medicare Other | Admitting: Hematology

## 2021-08-09 DIAGNOSIS — C9000 Multiple myeloma not having achieved remission: Secondary | ICD-10-CM

## 2021-08-09 DIAGNOSIS — J189 Pneumonia, unspecified organism: Secondary | ICD-10-CM | POA: Diagnosis not present

## 2021-08-09 LAB — BASIC METABOLIC PANEL
Anion gap: 10 (ref 5–15)
BUN: 5 mg/dL — ABNORMAL LOW (ref 8–23)
CO2: 25 mmol/L (ref 22–32)
Calcium: 8 mg/dL — ABNORMAL LOW (ref 8.9–10.3)
Chloride: 98 mmol/L (ref 98–111)
Creatinine, Ser: 0.58 mg/dL (ref 0.44–1.00)
GFR, Estimated: >60 mL/min (ref >60)
Glucose, Bld: 94 mg/dL (ref 70–99)
Potassium: 3.2 mmol/L — ABNORMAL LOW (ref 3.5–5.1)
Sodium: 133 mmol/L — ABNORMAL LOW (ref 135–145)

## 2021-08-09 LAB — CBC
HCT: 31.1 % — ABNORMAL LOW (ref 36.0–46.0)
Hemoglobin: 10 g/dL — ABNORMAL LOW (ref 12.0–15.0)
MCH: 30.2 pg (ref 26.0–34.0)
MCHC: 32.2 g/dL (ref 30.0–36.0)
MCV: 94 fL (ref 80.0–100.0)
Platelets: 304 10*3/uL (ref 150–400)
RBC: 3.31 MIL/uL — ABNORMAL LOW (ref 3.87–5.11)
RDW: 14.4 % (ref 11.5–15.5)
WBC: 3.4 10*3/uL — ABNORMAL LOW (ref 4.0–10.5)
nRBC: 0 % (ref 0.0–0.2)

## 2021-08-09 LAB — PROCALCITONIN: Procalcitonin: <0.1 ng/mL

## 2021-08-09 MED ORDER — POTASSIUM CHLORIDE CRYS ER 20 MEQ PO TBCR
40.0000 meq | EXTENDED_RELEASE_TABLET | Freq: Once | ORAL | Status: AC
  Administered 2021-08-09: 40 meq via ORAL
  Filled 2021-08-09: qty 2

## 2021-08-09 MED ORDER — LEVOFLOXACIN 500 MG PO TABS: 500.0000 mg | ORAL_TABLET | Freq: Every day | ORAL | 0 refills | Status: AC

## 2021-08-09 MED ORDER — ENSURE ENLIVE PO LIQD: 237.0000 mL | Freq: Two times a day (BID) | ORAL | 12 refills | Status: DC

## 2021-08-09 MED ORDER — LENALIDOMIDE 20 MG PO CAPS: 20.0000 mg | ORAL_CAPSULE | Freq: Every day | ORAL | 0 refills | Status: DC

## 2021-08-09 NOTE — Progress Notes (Signed)
°  Transition of Care [facility]) Screening Note   Patient Details  Name: Amanda Conner Date of Birth: 1950/12/18   Transition of Care [facility]) CM/SW Contact:    Ihor Gully, LCSW Phone Number: 08/09/2021, 9:56 AM    Transition of Care Department [facility]) has reviewed patient and no TOC needs have been identified at this time. We will continue to monitor patient advancement through interdisciplinary progression rounds. If new patient transition needs arise, please place a TOC consult.

## 2021-08-09 NOTE — Discharge Summary (Signed)
Physician Discharge Summary  Amanda Conner QJF:[id number] DOB: Mar 28, 1951 DOA: 08/06/2021  PCP: Yvone Neu, MD  Admit date: 08/06/2021  Discharge date: 08/09/2021  Admitted From:Home  Disposition:  Home  Recommendations for Outpatient Follow-up:  Follow up with PCP in 1-2 weeks Follow-up with oncology Dr. Delton Coombes in 1 week for reevaluation Continue on Levaquin as prescribed for 6 more days to complete total 8-day course of treatment and hold azithromycin for now and continue bedaquiline.  Follow-up with [facility] Dr. Lorenda Cahill on 1/31 as scheduled for further recommendations. Follow-up with neurosurgery outpatient to consider vertebroplasty for T12 compression fracture Continue other home medications as prior  Home Health: None  Equipment/Devices: None  Discharge Condition:Stable  CODE STATUS: Full  Diet recommendation: Heart Healthy  Brief/Interim Summary:  Amanda Conner  is a 71 y.o. female, with history of bronchiectasis, chronic cough, GERD, glaucoma, hyperlipidemia, hypertension, multiple myeloma on chemo with last infusion Thursday, and history of cefepime resistant pseudomonal infection, presents the ED with a chief complaint of cough.  Patient was admitted for pneumonia in the setting of immunocompromise and was started on Merrem given prior history of cefepime resistant Pseudomonas, but after further discussion with ID, was transitioned to oral Levaquin.  Blood cultures have demonstrated no growth and she has clinically improved quite significantly since initial admission.  She has had no significant fevers or leukocytosis.  She has been seen by oncology due to the concern for her chemotherapy and this appears to be on hold until further follow-up in 1 week.  She will also have follow-up with her ID specialist in less than 1 week and continue on the regimen as prescribed.  No other acute events noted throughout the course of this hospitalization.  Discharge  Diagnoses:  Principal Problem:   Pneumonia Active Problems:   Multiple myeloma ([place])   Hypokalemia   Compression fracture of body of thoracic vertebra (HCC)   GERD (gastroesophageal reflux disease)   Essential hypertension  Principal discharge diagnosis: Postobstructive pneumonia in the setting of immunocompromise with ongoing chemotherapy for multiple myeloma.  Discharge Instructions  Discharge Instructions     Diet - low sodium heart healthy   Complete by: As directed    Increase activity slowly   Complete by: As directed       Allergies as of 08/09/2021       Reactions   Tobramycin-dexamethasone Other (See Comments)   Itching and swelling   Ethambutol Other (See Comments)   Fever   Neomycin-polymyxin-dexameth Other (See Comments)   Itching and swelling   Amikacin Other (See Comments)   Eosinophilia with chills and aching when given with cefoxitin   Biaxin [clarithromycin] Itching   Cefoxitin Other (See Comments)   Eosinophlia when given with amikacin   Sulfamethoxazole-trimethoprim Itching   Vancomycin    Bacitracin-polymyxin B Rash        Medication List     STOP taking these medications    azithromycin 250 MG tablet Commonly known as: ZITHROMAX   oxyCODONE-acetaminophen 5-325 MG tablet Commonly known as: PERCOCET/ROXICET       TAKE these medications    acyclovir 400 MG tablet Commonly known as: ZOVIRAX Take 1 tablet (400 mg total) by mouth 2 (two) times daily.   albuterol 108 (90 Base) MCG/ACT inhaler Commonly known as: VENTOLIN HFA Inhale 2 puffs into the lungs every 6 (six) hours as needed for wheezing or shortness of breath.   aspirin EC 81 MG tablet Take 81 mg by mouth daily. Swallow whole.  benzonatate 100 MG capsule Commonly known as: TESSALON Take by mouth 3 (three) times daily as needed for cough.   Biotin 1000 MCG tablet Take 1,000 mcg by mouth daily.   calcitonin (salmon) 200 UNIT/ACT nasal spray Commonly known as:  MIACALCIN/FORTICAL SMARTSIG:Both Nares   Calcium Carb-Cholecalciferol 600-800 MG-UNIT Tabs Take 1 tablet by mouth daily.   daratumumab-hyaluronidase-fihj 1800-30000 MG-UT/15ML Soln Commonly known as: DARZALEX FASPRO Inject 1,800 mg into the skin once a week.   dexamethasone 2 MG tablet Commonly known as: DECADRON Take 5 tablets (10 mg total) by mouth once a week. What changed: additional instructions   dextromethorphan-guaiFENesin 30-600 MG 12hr tablet Commonly known as: MUCINEX DM Take 1 tablet by mouth daily.   estrogen (conjugated)-medroxyprogesterone 0.45-1.5 MG tablet Commonly known as: PREMPRO Take 1 tablet by mouth daily.   feeding supplement Liqd Take 237 mLs by mouth 2 (two) times daily between meals.   fluticasone 50 MCG/ACT nasal spray Commonly known as: FLONASE Place 1 spray into both nostrils daily.   hydrochlorothiazide 25 MG tablet Commonly known as: HYDRODIURIL Take 25 mg by mouth daily.   Lactulose 20 GM/30ML Soln Take 30 ml by mouth every 3 hours until bowel movement is had, then take 30 ml daily   latanoprost 0.005 % ophthalmic solution Commonly known as: XALATAN Place 1 drop into both eyes at bedtime.   lenalidomide 20 MG capsule Commonly known as: Revlimid Take 1 capsule (20 mg total) by mouth daily. Take for 14 days, then hold for 7 days. Repeat every 21 days.   levofloxacin 500 MG tablet Commonly known as: LEVAQUIN Take 1 tablet (500 mg total) by mouth daily for 6 days. Start taking on: August 10, 2021   linaclotide 145 MCG Caps capsule Commonly known as: LINZESS Take 145 mcg by mouth daily before breakfast.   lisinopril 20 MG tablet Commonly known as: ZESTRIL Take 20 mg by mouth daily.   loratadine 10 MG tablet Commonly known as: CLARITIN Take 10 mg by mouth daily.   methocarbamol 500 MG tablet Commonly known as: ROBAXIN Take 1 tablet (500 mg total) by mouth every 6 (six) hours as needed for muscle spasms.   omeprazole 20 MG  capsule Commonly known as: PRILOSEC Take 20 mg by mouth daily.   ondansetron 4 MG disintegrating tablet Commonly known as: Zofran ODT Take 1 tablet (4 mg total) by mouth every 4 (four) hours as needed for nausea or vomiting.   pantoprazole 40 MG tablet Commonly known as: PROTONIX Take 40 mg by mouth daily.   Sirturo 100 MG Tabs Generic drug: Bedaquiline Fumarate Take 200 mg by mouth every Monday, Wednesday, and Friday.   SUMAtriptan 100 MG tablet Commonly known as: IMITREX Take 100 mg by mouth every 2 (two) hours as needed for migraine. May repeat in 2 hours if headache persists or recurs.        Follow-up Information     Hungar[place], Jenetta Downer, MD. Schedule an appointment as soon as possible for a visit in 1 week(s).   Specialty: Family Medicine Contact information: [facility] [place] [address] [phone]         Derek Jack, MD Follow up in 1 week(s).   Specialty: Hematology Contact information: [place] [address] [phone]                Allergies  Allergen Reactions   Tobramycin-Dexamethasone Other (See Comments)    Itching and swelling   Ethambutol  Other (See Comments)    Fever   Neomycin-Polymyxin-Dexameth Other (See Comments)    Itching and swelling   Amikacin Other (See Comments)    Eosinophilia with chills and aching when given with cefoxitin   Biaxin [Clarithromycin] Itching   Cefoxitin Other (See Comments)    Eosinophlia when given with amikacin   Sulfamethoxazole-Trimethoprim Itching   Vancomycin    Bacitracin-Polymyxin B Rash    Consultations: Oncology   Procedures/Studies: DG Ribs Unilateral W/Chest Right  Result Date: 07/24/2021 CLINICAL DATA:  Rib pain on right side.  Multiple myeloma. EXAM: RIGHT RIBS AND CHEST - 3+ VIEW COMPARISON:  CT 05/24/2021 and chest radiograph 06/25/2021 FINDINGS: Again noted is an expansile lucent lesion involving the  posterior left seventh rib. Evidence for small surgical sutures along the periphery of the right lung. Hazy densities in the lower lungs bilaterally. Heart size is within normal limits and stable. Patient has bilateral breast implants. Mild irregularity and angulation of the right posterior eleventh rib. Difficult to exclude a subtle fracture in this area. Bilateral pedicle screw and interbody fusion in the lumbar spine which is incompletely imaged. IMPRESSION: 1. Mild irregularity involving the posterior right eleventh rib. Cannot exclude a subtle fracture at this location. 2. Known lucent lesion involving the posterior left seventh rib. 3. Hazy densities at lung bases could be related to atelectasis. Electronically Signed   By: Markus Daft M.D.   On: 07/24/2021 10:07   CT Angio Chest PE W/Cm &/Or Wo Cm  Result Date: 08/06/2021 CLINICAL DATA:  Pulmonary embolism (PE) suspected, high prob Chest tightness, shortness of breath, fatigue and fever. Active chemotherapy for multiple myeloma. EXAM: CT ANGIOGRAPHY CHEST WITH CONTRAST TECHNIQUE: Multidetector CT imaging of the chest was performed using the standard protocol during bolus administration of intravenous contrast. Multiplanar CT image reconstructions and MIPs were obtained to evaluate the vascular anatomy. RADIATION DOSE REDUCTION: This exam was performed according to the departmental dose-optimization program which includes automated exposure control, adjustment of the mA and/or kV according to patient size and/or use of iterative reconstruction technique. CONTRAST:  76m OMNIPAQUE IOHEXOL 350 MG/ML SOLN COMPARISON:  Chest radiograph earlier today.  PET-CT 06/14/2009 FINDINGS: Cardiovascular: Mild breathing motion artifact at the bases obscures detailed assessment. Allowing for this, There are no filling defects within the pulmonary arteries to suggest pulmonary embolus. No aortic dissection or acute aortic findings. Mild aortic atherosclerosis. Common origin  of the brachiocephalic and left common carotid artery, variant arch anatomy. Upper normal heart size. No pericardial effusion. Scattered coronary artery calcifications. Mediastinum/Nodes: No enlarged mediastinal or hilar lymph nodes. No esophageal thickening. No visualized thyroid nodule. Lungs/Pleura: Mild emphysema. Chronic areas of mucoid impaction with tubular density in the right middle lobe, stable from prior PET-CT. Possible adjacent chain suture versus calcification. Additional areas of mucoid impaction are new, particularly involving the left lower lobe where there is associated postobstructive consolidation in the lower most left lower lobe. Scattered tree-in-bud nodularity in the superior segment of the right lower lobe is unchanged from prior PET. Occasional areas of scarring, including the right lung apex. No significant pleural effusion. No ascending/pulmonary edema. No suspicious pulmonary mass. Upper Abdomen: No acute upper abdominal findings. Musculoskeletal: Compression fracture of T12 vertebra has progressed from lumbar spine MRI on 07/05/2021, now with 75% loss of height anteriorly. There is also mild T11 superior endplate compression fracture that is new. Heterogeneous appearance of marrow in the thoracic spine. Small lucent lesion in the sternal body. Lucent lesion in the left lateral eleventh  rib, with possible pathologic fracture. Lucent lesion in the posterior left seventh rib. Small lucent lesion within lateral ninth rib. Bilateral breast implants. Review of the MIP images confirms the above findings. IMPRESSION: 1. No pulmonary embolus. 2. Chronic areas of mucoid impaction with tubular density in the right middle lobe, stable from prior PET-CT. Additional areas of mucoid impaction are new, particularly involving the left lower lobe where there is associated postobstructive consolidation. 3. Heterogeneous appearance of marrow in the thoracic spine with multiple lucent lesions, likely  related to known multiple myeloma. Lucent lesions in the left lateral eleventh and posterior left seventh ribs, as well as ninth right rib. 4. Compression fracture of T12 vertebra has progressed from lumbar spine MRI on 07/05/2021, now with 75% loss of height anteriorly. There is also mild T11 superior endplate compression fracture that is new. Aortic Atherosclerosis (ICD10-I70.0) and Emphysema (ICD10-J43.9). Electronically Signed   By: Keith Rake M.D.   On: 08/06/2021 19:14   DG Chest Port 1 View  Result Date: 08/06/2021 CLINICAL DATA:  Cough.  Recent chemotherapy. EXAM: PORTABLE CHEST 1 VIEW COMPARISON:  Radiographs 07/23/2021 and 06/25/2021.  CT 05/24/2021. FINDINGS: 1521 hours. The heart size and mediastinal contours are stable with aortic atherosclerosis. There is chronic lung disease with asymmetric left basilar scarring, similar to previous studies. No superimposed airspace disease, edema, pneumothorax or significant pleural effusion identified. Old left-sided rib fractures, calcified breast implants and previous lumbar fusion are noted. IMPRESSION: Asymmetric left basilar scarring, similar to previous studies. No evidence of acute cardiopulmonary process. Electronically Signed   By: Richardean Sale M.D.   On: 08/06/2021 15:39     Discharge Exam: Vitals:   08/08/21 2120 08/09/21 0548  BP: 114/65 124/75  Pulse: 72 75  Resp: 18 18  Temp: 97.9 F (36.6 C) 98.6 F (37 C)  SpO2: 94% 93%   Vitals:   08/08/21 0604 08/08/21 1328 08/08/21 2120 08/09/21 0548  BP: 119/62 112/66 114/65 124/75  Pulse: 67 75 72 75  Resp: '19 17 18 18  ' Temp: 98.6 F (37 C) 98.7 F (37.1 C) 97.9 F (36.6 C) 98.6 F (37 C)  TempSrc: Oral Oral    SpO2: 97% 95% 94% 93%  Weight:      Height:        General: Pt is alert, awake, not in acute distress Cardiovascular: RRR, S1/S2 +, no rubs, no gallops Respiratory: CTA bilaterally, no wheezing, no rhonchi Abdominal: Soft, NT, ND, bowel sounds + Extremities:  no edema, no cyanosis    The results of significant diagnostics from this hospitalization (including imaging, microbiology, ancillary and laboratory) are listed below for reference.     Microbiology: Recent Results (from the past 240 hour(s))  Resp Panel by RT-PCR (Flu A&B, Covid) Nasopharyngeal Swab     Status: None   Collection Time: 08/06/21  3:14 PM   Specimen: Nasopharyngeal Swab; Nasopharyngeal(NP) swabs in vial transport medium  Result Value Ref Range Status   SARS Coronavirus 2 by RT PCR NEGATIVE NEGATIVE Final    Comment: (NOTE) SARS-CoV-2 target nucleic acids are NOT DETECTED.  The SARS-CoV-2 RNA is generally detectable in upper respiratory specimens during the acute phase of infection. The lowest concentration of SARS-CoV-2 viral copies this assay can detect is 138 copies/mL. A negative result does not preclude SARS-Cov-2 infection and should not be used as the sole basis for treatment or other patient management decisions. A negative result may occur with  improper specimen collection/handling, submission of specimen other than nasopharyngeal swab,  presence of viral mutation(s) within the areas targeted by this assay, and inadequate number of viral copies(<138 copies/mL). A negative result must be combined with clinical observations, patient history, and epidemiological information. The expected result is Negative.  Fact Sheet for Patients:  EntrepreneurPulse.com.au  Fact Sheet for Healthcare Providers:  IncredibleEmployment.be  This test is no t yet approved or cleared by the [place] FDA and  has been authorized for detection and/or diagnosis of SARS-CoV-2 by FDA under an Emergency Use Authorization (EUA). This EUA will remain  in effect (meaning this test can be used) for the duration of the COVID-19 declaration under Section 564(b)(1) of the Act, 21 U.S.C.section 360bbb-3(b)(1), unless the authorization is terminated   or revoked sooner.       Influenza A by PCR NEGATIVE NEGATIVE Final   Influenza B by PCR NEGATIVE NEGATIVE Final    Comment: (NOTE) The Xpert Xpress SARS-CoV-2/FLU/RSV plus assay is intended as an aid in the diagnosis of influenza from Nasopharyngeal swab specimens and should not be used as a sole basis for treatment. Nasal washings and aspirates are unacceptable for Xpert Xpress SARS-CoV-2/FLU/RSV testing.  Fact Sheet for Patients: EntrepreneurPulse.com.au  Fact Sheet for Healthcare Providers: IncredibleEmployment.be  This test is not yet approved or cleared by the [place] FDA and has been authorized for detection and/or diagnosis of SARS-CoV-2 by FDA under an Emergency Use Authorization (EUA). This EUA will remain in effect (meaning this test can be used) for the duration of the COVID-19 declaration under Section 564(b)(1) of the Act, 21 U.S.C. section 360bbb-3(b)(1), unless the authorization is terminated or revoked.  Performed at [facility], [address] [place], [place] [address]   Culture, blood (Routine X 2) w Reflex to ID Panel     Status: None (Preliminary result)   Collection Time: 08/06/21  4:00 PM   Specimen: BLOOD LEFT FOREARM  Result Value Ref Range Status   Specimen Description BLOOD LEFT FOREARM  Final   Special Requests   Final    BOTTLES DRAWN AEROBIC AND ANAEROBIC Blood Culture adequate volume   Culture   Final    NO GROWTH 3 DAYS Performed at [facility], [address] [place], [place] [address]    Report Status PENDING  Incomplete  Culture, blood (Routine X 2) w Reflex to ID Panel     Status: None (Preliminary result)   Collection Time: 08/06/21  4:00 PM   Specimen: Left Antecubital; Blood  Result Value Ref Range Status   Specimen Description LEFT ANTECUBITAL  Final   Special Requests   Final    BOTTLES DRAWN AEROBIC AND ANAEROBIC Blood Culture adequate volume   Culture   Final    NO GROWTH 3  DAYS Performed at [facility], [address] [place], [place] [address]    Report Status PENDING  Incomplete  MRSA Next Gen by PCR, Nasal     Status: None   Collection Time: 08/07/21  4:10 PM   Specimen: Nasal Mucosa; Nasal Swab  Result Value Ref Range Status   MRSA by PCR Next Gen NOT DETECTED NOT DETECTED Final    Comment: (NOTE) The GeneXpert MRSA Assay (FDA approved for NASAL specimens only), is one component of a comprehensive MRSA colonization surveillance program. It is not intended to diagnose MRSA infection nor to guide or monitor treatment for MRSA infections. Test performance is not FDA approved in patients less than 26 years old. Performed at [facility], [address], [place],  [address]  Labs: BNP (last 3 results) No results for input(s): BNP in the last 8760 hours. Basic Metabolic Panel: Recent Labs  Lab 08/02/21 1254 08/06/21 1552 08/07/21 0448 08/09/21 0503  NA 131* 130* 131* 133*  K 3.9 3.3* 4.5 3.2*  CL 96* 97* 99 98  CO2 '26 24 25 25  ' GLUCOSE 111* 97 118* 94  BUN '12 8 9 ' 5*  CREATININE 0.63 0.52 0.54 0.58  CALCIUM 8.8* 8.9 8.4* 8.0*  MG 1.9  --  1.9  --    Liver Function Tests: Recent Labs  Lab 08/02/21 1254 08/06/21 1552 08/07/21 0448  AST 16 13* 10*  ALT '14 14 11  ' ALKPHOS 77 81 74  BILITOT 0.6 0.4 0.5  PROT 7.5 6.9 5.9*  ALBUMIN 3.9 3.7 3.2*   No results for input(s): LIPASE, AMYLASE in the last 168 hours. No results for input(s): AMMONIA in the last 168 hours. CBC: Recent Labs  Lab 08/02/21 1254 08/06/21 1552 08/07/21 0448 08/09/21 0503  WBC 5.7 6.1 5.4 3.4*  NEUTROABS 4.5 4.7 3.5  --   HGB 11.0* 11.3* 10.6* 10.0*  HCT 33.7* 35.1* 32.5* 31.1*  MCV 94.9 95.9 95.6 94.0  PLT 270 279 254 304   Cardiac Enzymes: No results for input(s): CKTOTAL, CKMB, CKMBINDEX, TROPONINI in the last 168 hours. BNP: Invalid input(s): POCBNP CBG: No results for input(s): GLUCAP in the last 168 hours. D-Dimer No results for  input(s): DDIMER in the last 72 hours. Hgb A1c No results for input(s): HGBA1C in the last 72 hours. Lipid Profile No results for input(s): CHOL, HDL, LDLCALC, TRIG, CHOLHDL, LDLDIRECT in the last 72 hours. Thyroid function studies No results for input(s): TSH, T4TOTAL, T3FREE, THYROIDAB in the last 72 hours.  Invalid input(s): FREET3 Anemia work up No results for input(s): VITAMINB12, FOLATE, FERRITIN, TIBC, IRON, RETICCTPCT in the last 72 hours. Urinalysis    Component Value Date/Time   COLORURINE YELLOW 07/02/2021 1036   APPEARANCEUR CLEAR 07/02/2021 1036   LABSPEC 1.016 07/02/2021 1036   PHURINE 6.0 07/02/2021 1036   GLUCOSEU NEGATIVE 07/02/2021 1036   HGBUR SMALL (A) 07/02/2021 1036   BILIRUBINUR NEGATIVE 07/02/2021 1036   KETONESUR NEGATIVE 07/02/2021 1036   PROTEINUR NEGATIVE 07/02/2021 1036   NITRITE NEGATIVE 07/02/2021 1036   LEUKOCYTESUR NEGATIVE 07/02/2021 1036   Sepsis Labs Invalid input(s): PROCALCITONIN,  WBC,  LACTICIDVEN Microbiology Recent Results (from the past 240 hour(s))  Resp Panel by RT-PCR (Flu A&B, Covid) Nasopharyngeal Swab     Status: None   Collection Time: 08/06/21  3:14 PM   Specimen: Nasopharyngeal Swab; Nasopharyngeal(NP) swabs in vial transport medium  Result Value Ref Range Status   SARS Coronavirus 2 by RT PCR NEGATIVE NEGATIVE Final    Comment: (NOTE) SARS-CoV-2 target nucleic acids are NOT DETECTED.  The SARS-CoV-2 RNA is generally detectable in upper respiratory specimens during the acute phase of infection. The lowest concentration of SARS-CoV-2 viral copies this assay can detect is 138 copies/mL. A negative result does not preclude SARS-Cov-2 infection and should not be used as the sole basis for treatment or other patient management decisions. A negative result may occur with  improper specimen collection/handling, submission of specimen other than nasopharyngeal swab, presence of viral mutation(s) within the areas targeted by  this assay, and inadequate number of viral copies(<138 copies/mL). A negative result must be combined with clinical observations, patient history, and epidemiological information. The expected result is Negative.  Fact Sheet for Patients:  EntrepreneurPulse.com.au  Fact Sheet for Healthcare Providers:  IncredibleEmployment.be  This test is no t yet approved or cleared by the [place] and  has been authorized for detection and/or diagnosis of SARS-CoV-2 by FDA under an Emergency Use Authorization (EUA). This EUA will remain  in effect (meaning this test can be used) for the duration of the COVID-19 declaration under Section 564(b)(1) of the Act, 21 U.S.C.section 360bbb-3(b)(1), unless the authorization is terminated  or revoked sooner.       Influenza A by PCR NEGATIVE NEGATIVE Final   Influenza B by PCR NEGATIVE NEGATIVE Final    Comment: (NOTE) The Xpert Xpress SARS-CoV-2/FLU/RSV plus assay is intended as an aid in the diagnosis of influenza from Nasopharyngeal swab specimens and should not be used as a sole basis for treatment. Nasal washings and aspirates are unacceptable for Xpert Xpress SARS-CoV-2/FLU/RSV testing.  Fact Sheet for Patients: EntrepreneurPulse.com.au  Fact Sheet for Healthcare Providers: IncredibleEmployment.be  This test is not yet approved or cleared by the [place] FDA and has been authorized for detection and/or diagnosis of SARS-CoV-2 by FDA under an Emergency Use Authorization (EUA). This EUA will remain in effect (meaning this test can be used) for the duration of the COVID-19 declaration under Section 564(b)(1) of the Act, 21 U.S.C. section 360bbb-3(b)(1), unless the authorization is terminated or revoked.  Performed at [facility], [address], [place], [place] [address]   Culture, blood (Routine X 2) w Reflex to ID Panel     Status: None (Preliminary  result)   Collection Time: 08/06/21  4:00 PM   Specimen: BLOOD LEFT FOREARM  Result Value Ref Range Status   Specimen Description BLOOD LEFT FOREARM  Final   Special Requests   Final    BOTTLES DRAWN AEROBIC AND ANAEROBIC Blood Culture adequate volume   Culture   Final    NO GROWTH 3 DAYS Performed at [facility], [address] [place], [place] [address]    Report Status PENDING  Incomplete  Culture, blood (Routine X 2) w Reflex to ID Panel     Status: None (Preliminary result)   Collection Time: 08/06/21  4:00 PM   Specimen: Left Antecubital; Blood  Result Value Ref Range Status   Specimen Description LEFT ANTECUBITAL  Final   Special Requests   Final    BOTTLES DRAWN AEROBIC AND ANAEROBIC Blood Culture adequate volume   Culture   Final    NO GROWTH 3 DAYS Performed at [facility], [address], [place], [place] [address]    Report Status PENDING  Incomplete  MRSA Next Gen by PCR, Nasal     Status: None   Collection Time: 08/07/21  4:10 PM   Specimen: Nasal Mucosa; Nasal Swab  Result Value Ref Range Status   MRSA by PCR Next Gen NOT DETECTED NOT DETECTED Final    Comment: (NOTE) The GeneXpert MRSA Assay (FDA approved for NASAL specimens only), is one component of a comprehensive MRSA colonization surveillance program. It is not intended to diagnose MRSA infection nor to guide or monitor treatment for MRSA infections. Test performance is not FDA approved in patients less than 56 years old. Performed at [facility], [address], [place], [place] [address]      Time coordinating discharge: 35 minutes  SIGNED:   Rodena Goldmann, DO [facility] 08/09/2021, 9:28 AM  If 7PM-7AM, please contact night-coverage [email or web address]

## 2021-08-09 NOTE — Telephone Encounter (Signed)
Chart reviewed. Revlimid refilled per last office note with Dr. Katragadda.  

## 2021-08-09 NOTE — Progress Notes (Signed)
Patient is stable. Patient pulled her IV out on accident but requested to leave the IV out at this time since she has finished IV ABT and potential to discharge 08/09/21. Patient received pain med x2 for chronic back pain.

## 2021-08-09 NOTE — Progress Notes (Signed)
Nsg Discharge Note  Admit Date:  08/06/2021 Discharge date: 08/09/2021   Amanda Conner to be D/C'd Home per MD order.  AVS completed.  Copy for chart, and copy for patient signed, and dated. Patient/caregiver able to verbalize understanding.  Discharge Medication: Allergies as of 08/09/2021       Reactions   Tobramycin-dexamethasone Other (See Comments)   Itching and swelling   Ethambutol Other (See Comments)   Fever   Neomycin-polymyxin-dexameth Other (See Comments)   Itching and swelling   Amikacin Other (See Comments)   Eosinophilia with chills and aching when given with cefoxitin   Biaxin [clarithromycin] Itching   Cefoxitin Other (See Comments)   Eosinophlia when given with amikacin   Sulfamethoxazole-trimethoprim Itching   Vancomycin    Bacitracin-polymyxin B Rash        Medication List     STOP taking these medications    azithromycin 250 MG tablet Commonly known as: ZITHROMAX   lenalidomide 20 MG capsule Commonly known as: Revlimid   oxyCODONE-acetaminophen 5-325 MG tablet Commonly known as: PERCOCET/ROXICET       TAKE these medications    acyclovir 400 MG tablet Commonly known as: ZOVIRAX Take 1 tablet (400 mg total) by mouth 2 (two) times daily.   albuterol 108 (90 Base) MCG/ACT inhaler Commonly known as: VENTOLIN HFA Inhale 2 puffs into the lungs every 6 (six) hours as needed for wheezing or shortness of breath.   aspirin EC 81 MG tablet Take 81 mg by mouth daily. Swallow whole.   benzonatate 100 MG capsule Commonly known as: TESSALON Take by mouth 3 (three) times daily as needed for cough.   Biotin 1000 MCG tablet Take 1,000 mcg by mouth daily.   calcitonin (salmon) 200 UNIT/ACT nasal spray Commonly known as: MIACALCIN/FORTICAL SMARTSIG:Both Nares   Calcium Carb-Cholecalciferol 600-800 MG-UNIT Tabs Take 1 tablet by mouth daily.   daratumumab-hyaluronidase-fihj 1800-30000 MG-UT/15ML Soln Commonly known as: DARZALEX FASPRO Inject  1,800 mg into the skin once a week.   dexamethasone 2 MG tablet Commonly known as: DECADRON Take 5 tablets (10 mg total) by mouth once a week. What changed: additional instructions   dextromethorphan-guaiFENesin 30-600 MG 12hr tablet Commonly known as: MUCINEX DM Take 1 tablet by mouth daily.   estrogen (conjugated)-medroxyprogesterone 0.45-1.5 MG tablet Commonly known as: PREMPRO Take 1 tablet by mouth daily.   feeding supplement Liqd Take 237 mLs by mouth 2 (two) times daily between meals.   fluticasone 50 MCG/ACT nasal spray Commonly known as: FLONASE Place 1 spray into both nostrils daily.   hydrochlorothiazide 25 MG tablet Commonly known as: HYDRODIURIL Take 25 mg by mouth daily.   Lactulose 20 GM/30ML Soln Take 30 ml by mouth every 3 hours until bowel movement is had, then take 30 ml daily   latanoprost 0.005 % ophthalmic solution Commonly known as: XALATAN Place 1 drop into both eyes at bedtime.   levofloxacin 500 MG tablet Commonly known as: LEVAQUIN Take 1 tablet (500 mg total) by mouth daily for 6 days. Start taking on: August 10, 2021   linaclotide 145 MCG Caps capsule Commonly known as: LINZESS Take 145 mcg by mouth daily before breakfast.   lisinopril 20 MG tablet Commonly known as: ZESTRIL Take 20 mg by mouth daily.   loratadine 10 MG tablet Commonly known as: CLARITIN Take 10 mg by mouth daily.   methocarbamol 500 MG tablet Commonly known as: ROBAXIN Take 1 tablet (500 mg total) by mouth every 6 (six) hours as needed for muscle  spasms.   omeprazole 20 MG capsule Commonly known as: PRILOSEC Take 20 mg by mouth daily.   ondansetron 4 MG disintegrating tablet Commonly known as: Zofran ODT Take 1 tablet (4 mg total) by mouth every 4 (four) hours as needed for nausea or vomiting.   pantoprazole 40 MG tablet Commonly known as: PROTONIX Take 40 mg by mouth daily.   Sirturo 100 MG Tabs Generic drug: Bedaquiline Fumarate Take 200 mg by mouth  every Monday, Wednesday, and Friday.   SUMAtriptan 100 MG tablet Commonly known as: IMITREX Take 100 mg by mouth every 2 (two) hours as needed for migraine. May repeat in 2 hours if headache persists or recurs.        Discharge Assessment: Vitals:   08/08/21 2120 08/09/21 0548  BP: 114/65 124/75  Pulse: 72 75  Resp: 18 18  Temp: 97.9 F (36.6 C) 98.6 F (37 C)  SpO2: 94% 93%   Skin clean, dry and intact without evidence of skin break down, no evidence of skin tears noted. IV catheter discontinued intact. Site without signs and symptoms of complications - no redness or edema noted at insertion site, patient denies c/o pain - only slight tenderness at site.  Dressing with slight pressure applied.  D/c Instructions-Education: Discharge instructions given to patient/family with verbalized understanding. D/c education completed with patient/family including follow up instructions, medication list, d/c activities limitations if indicated, with other d/c instructions as indicated by MD - patient able to verbalize understanding, all questions fully answered. Patient instructed to return to ED, call 911, or call MD for any changes in condition.  Patient escorted via [place], and D/C home via private auto.  Dorcas Mcmurray, LPN 04/04/1941 7:40 PM

## 2021-08-09 NOTE — Progress Notes (Signed)
Oncology Discharge Planning Note  [place] at [facility] Address: [address], [place] [address] Hours of Operation:  8am - 5pm, Monday - Friday  Clinic Contact Information:  [phone]  Oncology Care Team: Medical Oncologist:  Derek Jack  Patient Details: Name:  Amanda Conner, Amanda Conner MRN:   [id number] DOB:   04/27/1951 Reason for Current Admission: Pneumonia  Discharge Planning Narrative: Discharge follow-up appointments for oncology are current and available on the AVS and MyChart.   Upon discharge from the hospital, hematology/oncology's post discharge plan of care for the outpatient setting is:  08/16/21  Consuello Lassalle will be called within two business days after discharge to review hematology/oncology's plan of care for full understanding.    Outpatient Oncology Specific Care Only: Oncology appointment transportation needs addressed?:  not applicable Oncology medication management for symptom management addressed?:  not applicable Chemo Alert Card reviewed?:  not applicable Immunotherapy Alert Card reviewed?:  not applicable

## 2021-08-09 NOTE — Care Management Important Message (Signed)
Important Message  Patient Details  Name: Amanda Conner MRN: [id number] Date of Birth: 1951/06/04   Medicare Important Message Given:        Tommy Medal 08/09/2021, 12:01 PM

## 2021-08-09 NOTE — Progress Notes (Signed)
Oncology Discharge Planning Note  [place] at [facility] Address: [address], [place] [address] Hours of Operation:  8am - 5pm, Monday - Friday  Clinic Contact Information:  [phone]  Oncology Care Team: Medical Oncologist:  Derek Jack  Patient Details: Name:  Amanda Conner, Amanda Conner MRN:   [id number] DOB:   [birth year] Reason for Current Admission: @PPROB @  Discharge Planning Narrative: Discharge follow-up appointments for oncology are current and available on the AVS and MyChart.   Upon discharge from the hospital, hematology/oncology's post discharge plan of care for the outpatient setting is: 08/16/21 with Lab, injection and OV with Dr. Delton Coombes.   Marnell Mcdaniel will be called within two business days after discharge to review hematology/oncology's plan of care for full understanding.    Outpatient Oncology Specific Care Only: Oncology appointment transportation needs addressed?:  not applicable Oncology medication management for symptom management addressed?:  not applicable Chemo Alert Card reviewed?:  not applicable Immunotherapy Alert Card reviewed?:  not applicable

## 2021-08-10 ENCOUNTER — Telehealth: Payer: Self-pay | Admitting: *Deleted

## 2021-08-10 NOTE — Telephone Encounter (Signed)
Amanda Conner was contacted by telephone to verify understanding of discharge instructions status post their most recent discharge from the hospital on the date:  08/09/21.  Inpatient discharge AVS was re-reviewed with patient, along with cancer center appointments.  Verification of understanding for oncology specific follow-up was validated using the Teach Back method.  She is currently holding Revlamid as directed and will follow up in office on 08/16/21 as planned.  Transportation to appointments were confirmed for the patient as being self/caregiver.  Amanda Conner questions were addressed to their satisfaction upon completion of this post discharge follow-up call for outpatient oncology.

## 2021-08-11 LAB — CULTURE, BLOOD (ROUTINE X 2)
Culture: NO GROWTH
Culture: NO GROWTH
Special Requests: ADEQUATE
Special Requests: ADEQUATE

## 2021-08-14 ENCOUNTER — Other Ambulatory Visit (HOSPITAL_COMMUNITY): Payer: Self-pay | Admitting: Neurological Surgery

## 2021-08-14 DIAGNOSIS — S22080A Wedge compression fracture of T11-T12 vertebra, initial encounter for closed fracture: Secondary | ICD-10-CM

## 2021-08-16 ENCOUNTER — Other Ambulatory Visit: Payer: Self-pay

## 2021-08-16 ENCOUNTER — Inpatient Hospital Stay (HOSPITAL_COMMUNITY): Payer: Medicare Other | Admitting: Dietician

## 2021-08-16 ENCOUNTER — Ambulatory Visit (HOSPITAL_COMMUNITY)
Admission: RE | Admit: 2021-08-16 | Discharge: 2021-08-16 | Disposition: A | Discharge status: Home / Self Care | Payer: Medicare Other | Source: Ambulatory Visit | Attending: Neurological Surgery | Admitting: Neurological Surgery

## 2021-08-16 ENCOUNTER — Inpatient Hospital Stay (HOSPITAL_COMMUNITY): Payer: Medicare Other | Attending: Hematology

## 2021-08-16 ENCOUNTER — Inpatient Hospital Stay (HOSPITAL_BASED_OUTPATIENT_CLINIC_OR_DEPARTMENT_OTHER): Payer: Medicare Other | Admitting: Hematology

## 2021-08-16 ENCOUNTER — Inpatient Hospital Stay (HOSPITAL_COMMUNITY): Payer: Medicare Other

## 2021-08-16 ENCOUNTER — Encounter (HOSPITAL_COMMUNITY): Payer: Self-pay

## 2021-08-16 VITALS — BP 138/78 | HR 78 | Temp 98.3°F | Resp 18 | Ht 64.0 in | Wt 134.0 lb

## 2021-08-16 VITALS — BP 154/71 | HR 77 | Temp 98.3°F | Resp 18

## 2021-08-16 DIAGNOSIS — C9000 Multiple myeloma not having achieved remission: Secondary | ICD-10-CM

## 2021-08-16 DIAGNOSIS — M899 Disorder of bone, unspecified: Secondary | ICD-10-CM

## 2021-08-16 DIAGNOSIS — S22080A Wedge compression fracture of T11-T12 vertebra, initial encounter for closed fracture: Secondary | ICD-10-CM | POA: Diagnosis present

## 2021-08-16 LAB — COMPREHENSIVE METABOLIC PANEL
ALT: 18 U/L (ref 0–44)
AST: 26 U/L (ref 15–41)
Albumin: 4.4 g/dL (ref 3.5–5.0)
Alkaline Phosphatase: 101 U/L (ref 38–126)
Anion gap: 11 (ref 5–15)
BUN: 8 mg/dL (ref 8–23)
CO2: 24 mmol/L (ref 22–32)
Calcium: 9.1 mg/dL (ref 8.9–10.3)
Chloride: 97 mmol/L — ABNORMAL LOW (ref 98–111)
Creatinine, Ser: 0.71 mg/dL (ref 0.44–1.00)
GFR, Estimated: >60 mL/min (ref >60)
Glucose, Bld: 113 mg/dL — ABNORMAL HIGH (ref 70–99)
Potassium: 3.5 mmol/L (ref 3.5–5.1)
Sodium: 132 mmol/L — ABNORMAL LOW (ref 135–145)
Total Bilirubin: 0.4 mg/dL (ref 0.3–1.2)
Total Protein: 7.4 g/dL (ref 6.5–8.1)

## 2021-08-16 LAB — CBC WITH DIFFERENTIAL/PLATELET
Abs Immature Granulocytes: 0.01 10*3/uL (ref 0.00–0.07)
Basophils Absolute: 0 10*3/uL (ref 0.0–0.1)
Basophils Relative: 1 %
Eosinophils Absolute: 0.1 10*3/uL (ref 0.0–0.5)
Eosinophils Relative: 2 %
HCT: 37 % (ref 36.0–46.0)
Hemoglobin: 11.9 g/dL — ABNORMAL LOW (ref 12.0–15.0)
Immature Granulocytes: 0 %
Lymphocytes Relative: 25 %
Lymphs Abs: 1.3 10*3/uL (ref 0.7–4.0)
MCH: 30.3 pg (ref 26.0–34.0)
MCHC: 32.2 g/dL (ref 30.0–36.0)
MCV: 94.1 fL (ref 80.0–100.0)
Monocytes Absolute: 0.8 10*3/uL (ref 0.1–1.0)
Monocytes Relative: 16 %
Neutro Abs: 2.9 10*3/uL (ref 1.7–7.7)
Neutrophils Relative %: 56 %
Platelets: 406 10*3/uL — ABNORMAL HIGH (ref 150–400)
RBC: 3.93 MIL/uL (ref 3.87–5.11)
RDW: 14.7 % (ref 11.5–15.5)
WBC: 5.1 10*3/uL (ref 4.0–10.5)
nRBC: 0 % (ref 0.0–0.2)

## 2021-08-16 LAB — MAGNESIUM: Magnesium: 2 mg/dL (ref 1.7–2.4)

## 2021-08-16 IMAGING — MR MR THORACIC SPINE W/O CM
5 of 6 series · 27 of 48 positions shown · non-contrast
Comparison: [DATE] chest CT

CLINICAL DATA: Severe mid back pain.  History of prior fusion.

EXAM:
MRI THORACIC SPINE WITHOUT CONTRAST
TECHNIQUE: Multiplanar, multisequence MR imaging of the thoracic spine was
performed. No intravenous contrast was administered.

[Series 18: T1 · sagittal · 6.0mm · 1.09mm/px · 2 of 8 slices shown (1 of 2)]
[im 1/8]
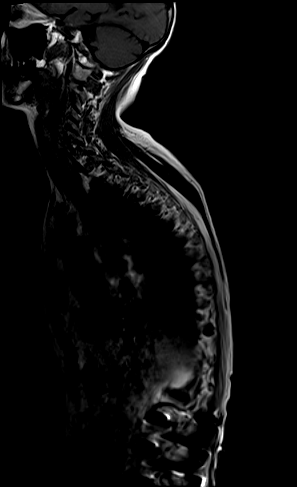
[im 8/8]
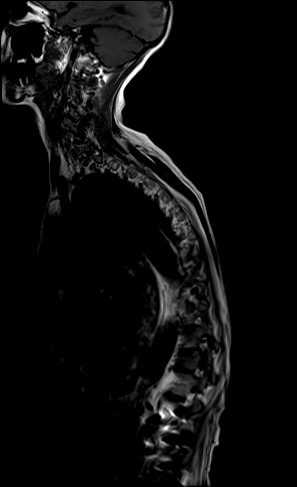

[Series 19: T2 · sagittal · 3.0mm · 1.12mm/px · 6 of 17 slices shown (1 of 2)]
[im 1/17]
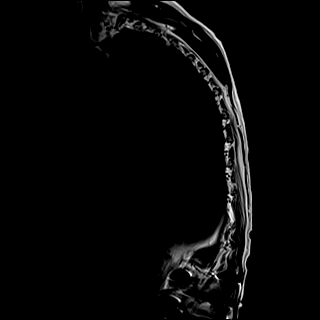
[im 4/17]
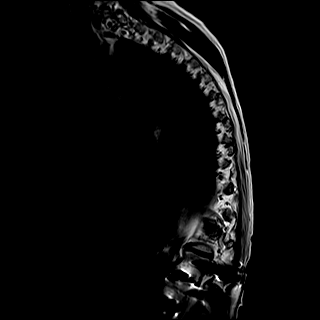
[im 7/17]
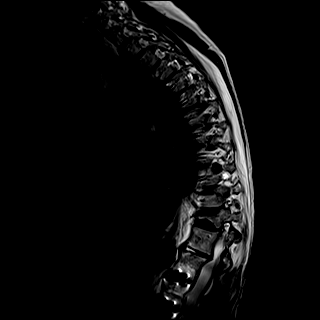
[im 10/17]
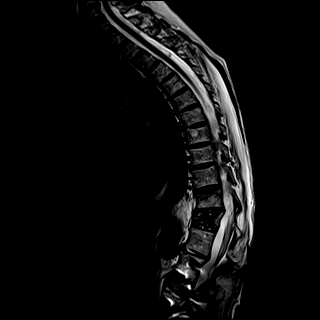
[im 13/17]
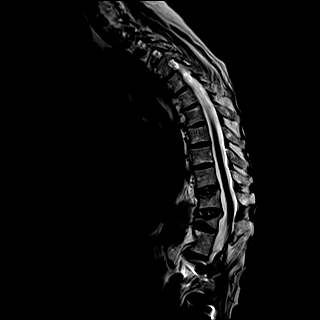
[im 17/17]
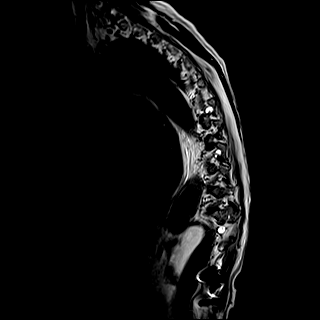

[Series 20: T1 · sagittal · 3.0mm · 0.79mm/px · 6 of 17 slices shown (2 of 2)]
[im 1/17]
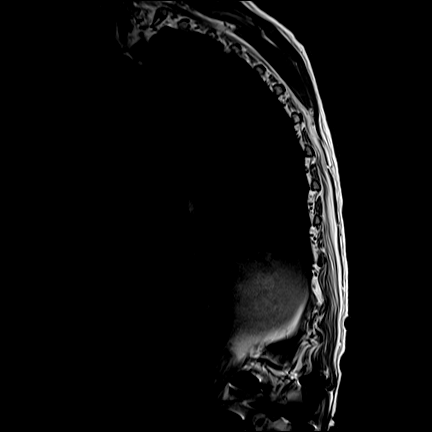
[im 4/17]
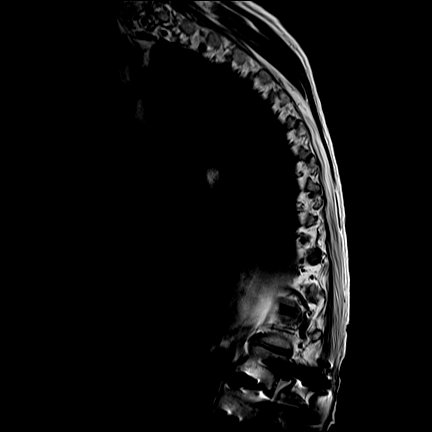
[im 7/17]
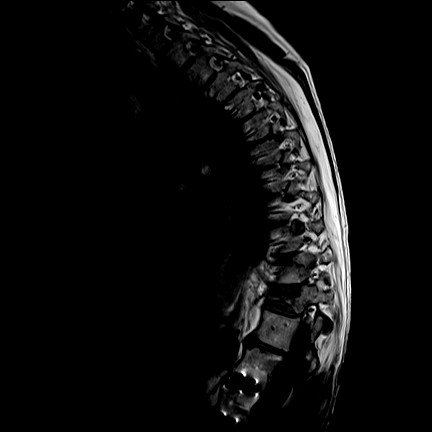
[im 10/17]
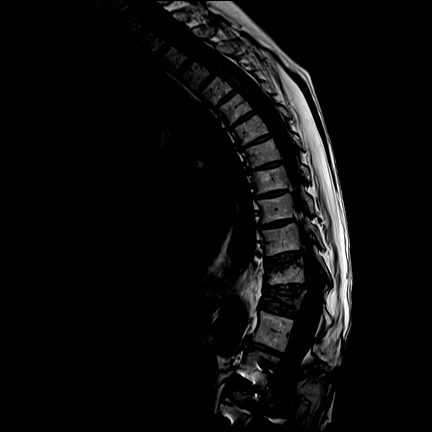
[im 13/17]
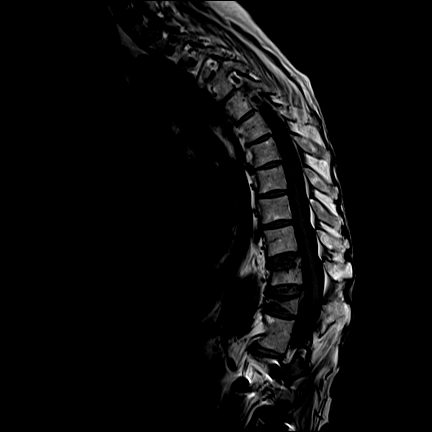
[im 17/17]
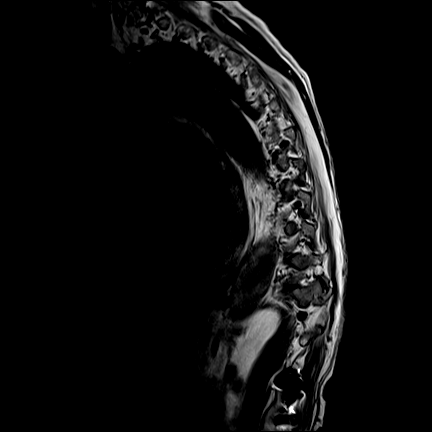

[Series 21: STIR · sagittal · 3.0mm · 0.56mm/px · 5 of 17 slices shown]
[im 1/17]
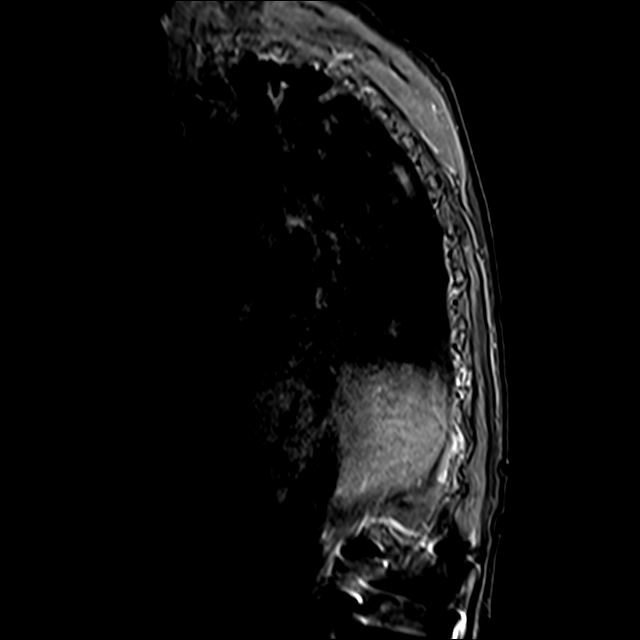
[im 4/17]
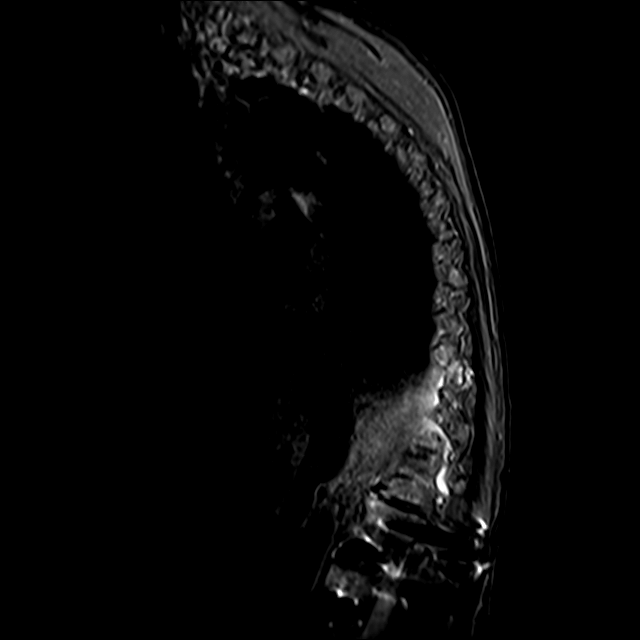
[im 7/17]
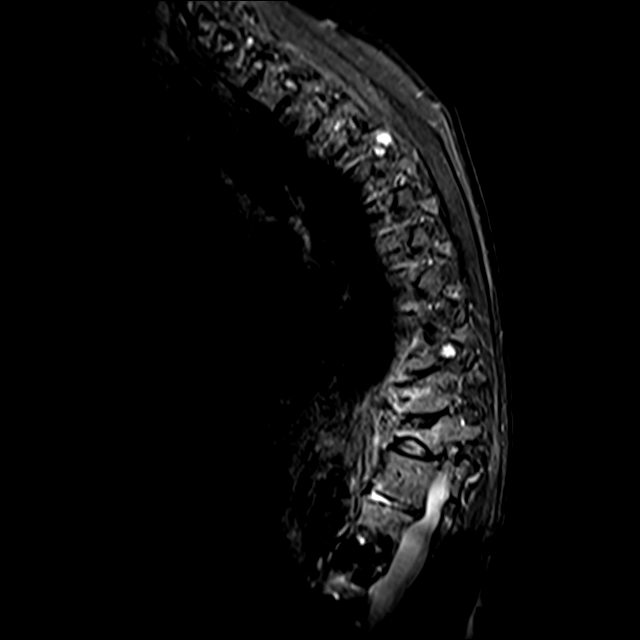
[im 10/17]
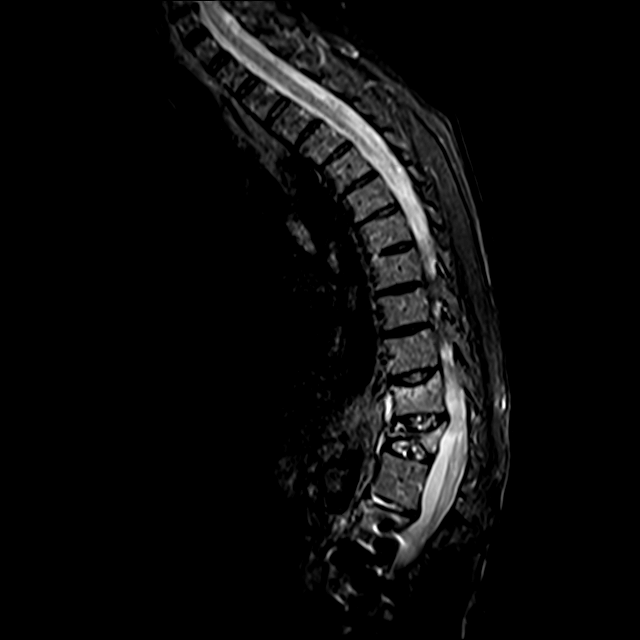
[im 13/17]
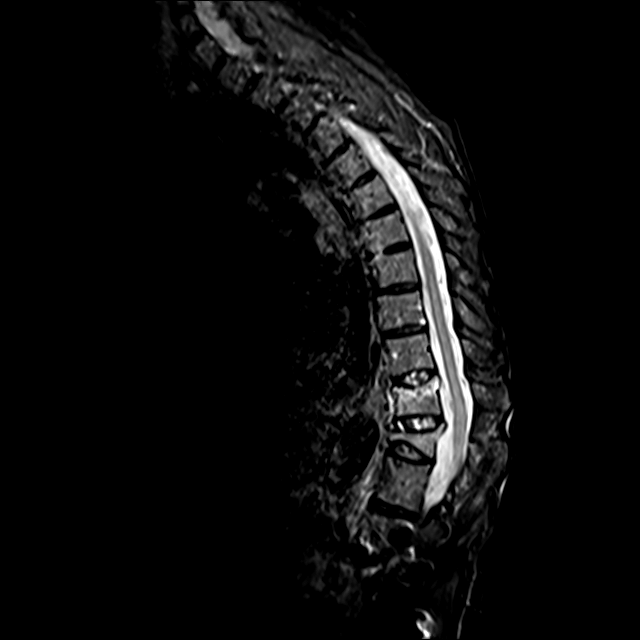

[Series 22: T2 · axial · 4.0mm · 0.59mm/px · z∈[-222,-51]mm · 8 of 43 slices shown (2 of 2)]
[im 1/43]
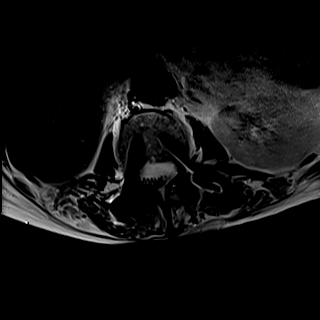
[im 7/43]
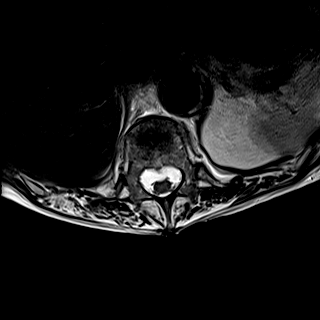
[im 13/43]
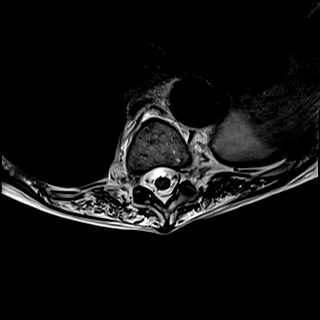
[im 20/43]
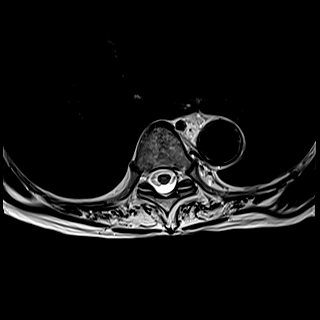
[im 23/43]
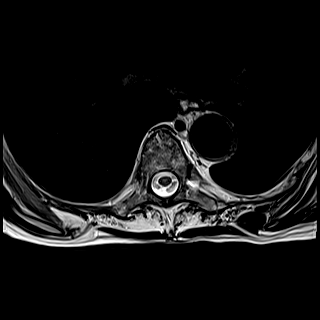
[im 30/43]
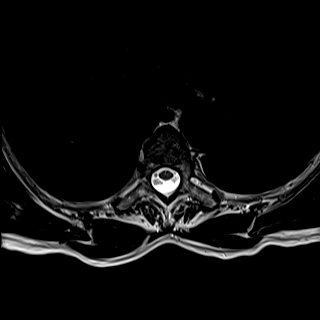
[im 36/43]
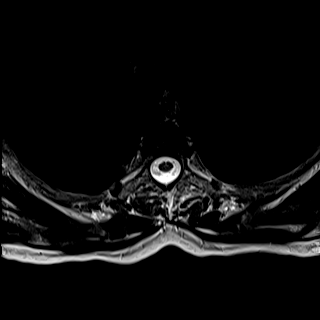
[im 43/43]
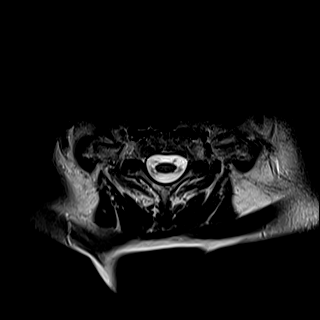

[27 of 48 positions shown; findings below may reference images not displayed]

FINDINGS: Alignment: Exaggerated thoracic kyphosis. Slight anterolisthesis at
T1-2 to T4-5.

Vertebrae: Compression fracture of T12 with hypointense horizontal
fracture plane and mild marrow edema. Central height loss is
advanced. Mild posterosuperior corner retropulsion.

T11 compression fracture with mild superior endplate depression and
greater marrow edema. Mild associated paravertebral edema. Mild
height loss is unchanged from before with no retropulsion. No
evidence of bone lesion at these levels.

Lumbar fusion at at L2-3 and L3-4 at least.

STIR hyperintense bone lesions at the left articular process of T5
and in the left posterior seventh rib, the latter known from prior
chest CT in this patient with history of multiple myeloma.

Cord:  Normal

Paraspinal and other soft tissues: Negative for mass or acute
inflammation. Root sleeve cyst on the right at T10-11 and left at
T7-8. atrophy of intrinsic back muscles.

Disc levels:

Diffusely preserved disc height and hydration for age. No focal or
advanced facet spurring. Thoracic cord or foraminal impingement.
IMPRESSION: 1. Recent T11 compression fracture with mild height loss. No
evidence of underlying lesion.
2. More remote but still un healed T12 compression fracture with
advanced height loss and mild, noncompressive retropulsion.
3. Small bone lesion in the T5 left articular process. Known left
posterior seventh rib lesion. These likely relate to history of
multiple myeloma.

## 2021-08-16 MED ORDER — ZOLEDRONIC ACID 4 MG/100ML IV SOLN
4.0000 mg | Freq: Once | INTRAVENOUS | Status: AC
  Administered 2021-08-16: 4 mg via INTRAVENOUS
  Filled 2021-08-16: qty 100

## 2021-08-16 MED ORDER — SODIUM CHLORIDE 0.9 % IV SOLN: Freq: Once | INTRAVENOUS | Status: AC

## 2021-08-16 NOTE — Patient Instructions (Signed)
[facility]  Discharge Instructions: Thank you for choosing [facility] to provide your oncology and hematology care.  If you have a lab appointment with the Cancer Center, please come in thru the Main Entrance and check in at the main information desk.  Wear comfortable clothing and clothing appropriate for easy access to any Portacath or PICC line.   We strive to give you quality time with your provider. You may need to reschedule your appointment if you arrive late (15 or more minutes).  Arriving late affects you and other patients whose appointments are after yours.  Also, if you miss three or more appointments without notifying the office, you may be dismissed from the clinic at the provider's discretion.      For prescription refill requests, have your pharmacy contact our office and allow 72 hours for refills to be completed.        To help prevent nausea and vomiting after your treatment, we encourage you to take your nausea medication as directed.  BELOW ARE SYMPTOMS THAT SHOULD BE REPORTED IMMEDIATELY: *FEVER GREATER THAN 100.4 F (38 C) OR HIGHER *CHILLS OR SWEATING *NAUSEA AND VOMITING THAT IS NOT CONTROLLED WITH YOUR NAUSEA MEDICATION *UNUSUAL SHORTNESS OF BREATH *UNUSUAL BRUISING OR BLEEDING *URINARY PROBLEMS (pain or burning when urinating, or frequent urination) *BOWEL PROBLEMS (unusual diarrhea, constipation, pain near the anus) TENDERNESS IN MOUTH AND THROAT WITH OR WITHOUT PRESENCE OF ULCERS (sore throat, sores in mouth, or a toothache) UNUSUAL RASH, SWELLING OR PAIN  UNUSUAL VAGINAL DISCHARGE OR ITCHING   Items with * indicate a potential emergency and should be followed up as soon as possible or go to the Emergency Department if any problems should occur.  Please show the CHEMOTHERAPY ALERT CARD or IMMUNOTHERAPY ALERT CARD at check-in to the Emergency Department and triage nurse.  Should you have questions after your visit or need to cancel  or reschedule your appointment, please contact [facility] [phone]  and follow the prompts.  Office hours are 8:00 a.m. to 4:30 p.m. Monday - Friday. Please note that voicemails left after 4:00 p.m. may not be returned until the following business day.  We are closed weekends and major holidays. You have access to a nurse at all times for urgent questions. Please call the main number to the clinic [phone] and follow the prompts.  For any non-urgent questions, you may also contact your provider using MyChart. We now offer e-Visits for anyone 18 and older to request care online for non-urgent symptoms. For details visit [email or web address].   Also download the MyChart app! Go to the app store, search "MyChart", open the app, select [place], and log in with your MyChart username and password.  Due to Covid, a mask is required upon entering the hospital/clinic. If you do not have a mask, one will be given to you upon arrival. For doctor visits, patients may have 1 support person aged 18 or older with them. For treatment visits, patients cannot have anyone with them due to current Covid guidelines and our immunocompromised population.  

## 2021-08-16 NOTE — Patient Instructions (Addendum)
[place] at [facility] Discharge Instructions   You were seen and examined today by Dr. Delton Coombes.  We will hold treatment until you have your procedure on your spine.  We will give you Zometa today to help strengthen your bones.  Return as scheduled.    Thank you for choosing [place] at [facility] to provide your oncology and hematology care.  To afford each patient quality time with our provider, please arrive at least 15 minutes before your scheduled appointment time.   If you have a lab appointment with the [facility] please come in thru the Main Entrance and check in at the main information desk.  You need to re-schedule your appointment should you arrive 10 or more minutes late.  We strive to give you quality time with our providers, and arriving late affects you and other patients whose appointments are after yours.  Also, if you no show three or more times for appointments you may be dismissed from the clinic at the providers discretion.     Again, thank you for choosing [facility].  Our hope is that these requests will decrease the amount of time that you wait before being seen by our physicians.       _____________________________________________________________  Should you have questions after your visit to [facility], please contact our office at [phone] and follow the prompts.  Our office hours are 8:00 a.m. and 4:30 p.m. Monday - Friday.  Please note that voicemails left after 4:00 p.m. may not be returned until the following business day.  We are closed weekends and major holidays.  You do have access to a nurse 24-7, just call the main number to the clinic [phone] and do not press any options, hold on the line and a nurse will answer the phone.    For prescription refill requests, have your pharmacy contact our office and allow 72 hours.    Due to Covid, you will need to wear a  mask upon entering the hospital. If you do not have a mask, a mask will be given to you at the Main Entrance upon arrival. For doctor visits, patients may have 1 support person age 72 or older with them. For treatment visits, patients can not have anyone with them due to social distancing guidelines and our immunocompromised population.

## 2021-08-16 NOTE — Progress Notes (Signed)
[place], [place] [address]   CLINIC:  Medical Oncology/Hematology  PCP:  Yvone Neu, MD [facility] [address] /* [phone]   REASON FOR VISIT:  Follow-up for multiple myeloma  PRIOR THERAPY: none  NGS Results: not done  CURRENT THERAPY: DaraVRd (Daratumumab SQ) q21d x 6 Cycles (Induction/Consolidation)  BRIEF ONCOLOGIC HISTORY:  Oncology History  Multiple myeloma ([place])  07/17/2021 Initial Diagnosis   Multiple myeloma ([place])   07/26/2021 -  Chemotherapy   Patient is on Treatment Plan : MYELOMA NEWLY DIAGNOSED TRANSPLANT CANDIDATE DaraVRd (Daratumumab SQ) q21d x 6 Cycles (Induction/Consolidation)       CANCER STAGING:  Cancer Staging  Multiple myeloma ([place]) Staging form: Multiple Myeloma, AJCC 6th Edition - Clinical stage from 07/17/2021: Stage IA - Unsigned   INTERVAL HISTORY:  [name], a 71 y.o. female, returns for routine follow-up of her multiple myeloma. Alicya was last seen on 07/19/2021.   Today she reports feeling fair. She reports nausea and poor appetite. She also reports fatigue. Her cough has improved and returned to her baseline. She reports back pain.   REVIEW OF SYSTEMS:  Review of Systems  Constitutional:  Positive for appetite change and fatigue.  Respiratory:  Positive for cough (improved) and shortness of breath.   Gastrointestinal:  Positive for constipation and nausea.  Musculoskeletal:  Positive for back pain (6/10).  Neurological:  Positive for headaches.  All other systems reviewed and are negative.  PAST MEDICAL/SURGICAL HISTORY:  Past Medical History:  Diagnosis Date   Arthritis    osteoarthritis   Back pain    Bronchiectasis (HCC)    Cancer (HCC)    basal cell nose   Chronic cough    Complication of anesthesia    Dyspnea    Gastrointestinal symptoms    GERD (gastroesophageal reflux disease)    Glaucoma    History of hiatal  hernia    Hyperlipemia    Hypertension    Osteopenia    Pneumonia    PONV (postoperative nausea and vomiting)    "only one time"   Spondylisthesis    Past Surgical History:  Procedure Laterality Date   ABDOMINAL EXPOSURE N/A 12/29/2018   Procedure: ABDOMINAL EXPOSURE;  Surgeon: Angelia Mould, MD;  Location: [facility];  Service: Vascular;  Laterality: N/A;   ABDOMINOPLASTY  2002   ABDOMINOPLASTY  1970's   ANKLE SURGERY  10/2015   ANTERIOR LATERAL LUMBAR FUSION WITH PERCUTANEOUS SCREW 3 LEVEL Left 12/29/2018   Procedure: Lumbar Two to Lumbar Five Anterolateral lumbar interbody fusion;  Surgeon: Erline Levine, MD;  Location: [facility];  Service: Neurosurgery;  Laterality: Left;  anterolateral   ANTERIOR LUMBAR FUSION N/A 12/29/2018   Procedure: Lumbar Five Sacral One Anterior lumbar interbody fusion;  Surgeon: Erline Levine, MD;  Location: [facility];  Service: Neurosurgery;  Laterality: N/A;  anterior approach   BASAL CELL CARCINOMA EXCISION  06/2018   nose   BLEPHAROPLASTY Bilateral 1999   BREAST ENHANCEMENT SURGERY  1970's   COLONOSCOPY     CYSTOURETHROSCOPY  2018   Doble J ureteal stents   DECOMPRESSION CORE HIP Left 2014   FACIAL COSMETIC SURGERY  2004   FLUOROSCOPY GUIDANCE     HIP ARTHROPLASTY Left    INCISIONAL HERNIA REPAIR N/A 10/08/2019   Procedure: OPEN INCISIONAL HERNIA REPAIR WITH MESH;  Surgeon: Armandina Gemma, MD;  Location: [facility];  Service: General;  Laterality: N/A;   JOINT  REPLACEMENT Left    05/2014   LAPAROSCOPIC PARTIAL COLECTOMY  06/26/2017   LUMBAR PERCUTANEOUS PEDICLE SCREW 4 LEVEL N/A 12/29/2018   Procedure: Lumbar Percutaneous Pedicle Screw Placement Lumbar two-Sacral one;  Surgeon: Erline Levine, MD;  Location: [facility];  Service: Neurosurgery;  Laterality: N/A;   MOHS SURGERY  2019   nose   PARTIAL COLECTOMY  06/2017   for diverticulitis   REMOVAL OF BILATERAL TISSUE EXPANDERS WITH PLACEMENT OF BILATERAL BREAST IMPLANTS  2004   THIGH LIFT  1994   TOE FUSION  Right    great toe   TONSILLECTOMY     UMBILICAL HERNIA REPAIR     with tummy tuck   UMBILICAL HERNIA REPAIR  2002    SOCIAL HISTORY:  Social History   Socioeconomic History   Marital status: Married    Spouse name: Not on file   Number of children: Not on file   Years of education: Not on file   Highest education level: Not on file  Occupational History   Not on file  Tobacco Use   Smoking status: Never   Smokeless tobacco: Never  Vaping Use   Vaping Use: Never used  Substance and Sexual Activity   Alcohol use: Yes    Comment: occasional   Drug use: Never   Sexual activity: Not on file  Other Topics Concern   Not on file  Social History Narrative   Not on file   Social Determinants of Health   Financial Resource Strain: Low Risk    Difficulty of Paying Living Expenses: Not hard at all  Food Insecurity: No Food Insecurity   Worried About Charity fundraiser in the Last Year: Never true   [place] in the Last Year: Never true  Transportation Needs: No Transportation Needs   Lack of Transportation (Medical): No   Lack of Transportation (Non-Medical): No  Physical Activity: Not on file  Stress: Not on file  Social Connections: Socially Integrated   Frequency of Communication with Friends and Family: More than three times a week   Frequency of Social Gatherings with Friends and Family: More than three times a week   Attends Religious Services: 1 to 4 times per year   Active Member of [employer] or Organizations: No   Attends Music therapist: 1 to 4 times per year   Marital Status: Married  Human resources officer Violence: Not on file    FAMILY HISTORY:  Family History  Problem Relation Age of Onset   Hypertension Mother    COPD Mother    Hypertension Father    Heart disease Father     CURRENT MEDICATIONS:  Current Outpatient Medications  Medication Sig Dispense Refill   acyclovir (ZOVIRAX) 400 MG tablet Take 1 tablet (400 mg total) by mouth 2  (two) times daily. 60 tablet 4   albuterol (VENTOLIN HFA) 108 (90 Base) MCG/ACT inhaler Inhale 2 puffs into the lungs every 6 (six) hours as needed for wheezing or shortness of breath.     aspirin EC 81 MG tablet Take 81 mg by mouth daily. Swallow whole.     Bedaquiline Fumarate (SIRTURO) 100 MG TABS Take 200 mg by mouth every Monday, Wednesday, and Friday.     benzonatate (TESSALON) 100 MG capsule Take by mouth 3 (three) times daily as needed for cough.     Biotin 1000 MCG tablet Take 1,000 mcg by mouth daily.     calcitonin, salmon, (MIACALCIN/FORTICAL) 200 UNIT/ACT nasal spray SMARTSIG:Both  Nares     Calcium Carb-Cholecalciferol 600-800 MG-UNIT TABS Take 1 tablet by mouth daily.     daratumumab-hyaluronidase-fihj (DARZALEX FASPRO) 1800-30000 MG-UT/15ML SOLN Inject 1,800 mg into the skin once a week.     dexamethasone (DECADRON) 2 MG tablet Take 5 tablets (10 mg total) by mouth once a week. (Patient taking differently: Take 10 mg by mouth once a week. Thursdays) 20 tablet 4   dextromethorphan-guaiFENesin (MUCINEX DM) 30-600 MG 12hr tablet Take 1 tablet by mouth daily.      estrogen, conjugated,-medroxyprogesterone (PREMPRO) 0.45-1.5 MG tablet Take 1 tablet by mouth daily.     feeding supplement (ENSURE ENLIVE / ENSURE PLUS) LIQD Take 237 mLs by mouth 2 (two) times daily between meals. 237 mL 12   fluticasone (FLONASE) 50 MCG/ACT nasal spray Place 1 spray into both nostrils daily.     hydrochlorothiazide (HYDRODIURIL) 25 MG tablet Take 25 mg by mouth daily.     Lactulose 20 GM/30ML SOLN Take 30 ml by mouth every 3 hours until bowel movement is had, then take 30 ml daily 946 mL 3   latanoprost (XALATAN) 0.005 % ophthalmic solution Place 1 drop into both eyes at bedtime.      levofloxacin (LEVAQUIN) 500 MG tablet Take 1 tablet (500 mg total) by mouth daily for 6 days. 6 tablet 0   linaclotide (LINZESS) 145 MCG CAPS capsule Take 145 mcg by mouth daily before breakfast.     lisinopril (ZESTRIL) 20 MG  tablet Take 20 mg by mouth daily.     loratadine (CLARITIN) 10 MG tablet Take 10 mg by mouth daily.     methocarbamol (ROBAXIN) 500 MG tablet Take 1 tablet (500 mg total) by mouth every 6 (six) hours as needed for muscle spasms. 40 tablet 3   omeprazole (PRILOSEC) 20 MG capsule Take 20 mg by mouth daily.     ondansetron (ZOFRAN ODT) 4 MG disintegrating tablet Take 1 tablet (4 mg total) by mouth every 4 (four) hours as needed for nausea or vomiting. 20 tablet 6   pantoprazole (PROTONIX) 40 MG tablet Take 40 mg by mouth daily.     SUMAtriptan (IMITREX) 100 MG tablet Take 100 mg by mouth every 2 (two) hours as needed for migraine. May repeat in 2 hours if headache persists or recurs.     No current facility-administered medications for this visit.    ALLERGIES:  Allergies  Allergen Reactions   Tobramycin-Dexamethasone Other (See Comments)    Itching and swelling   Ethambutol Other (See Comments)    Fever   Neomycin-Polymyxin-Dexameth Other (See Comments)    Itching and swelling   Amikacin Other (See Comments)    Eosinophilia with chills and aching when given with cefoxitin   Biaxin [Clarithromycin] Itching   Cefoxitin Other (See Comments)    Eosinophlia when given with amikacin   Sulfamethoxazole-Trimethoprim Itching   Vancomycin    Bacitracin-Polymyxin B Rash    PHYSICAL EXAM:  Performance status (ECOG): 1 - Symptomatic but completely ambulatory  Vitals:   08/16/21 1037  BP: 138/78  Pulse: 78  Resp: 18  Temp: 98.3 F (36.8 C)  SpO2: 95%   Wt Readings from Last 3 Encounters:  08/16/21 134 lb (60.8 kg)  08/06/21 132 lb 4.4 oz (60 kg)  07/26/21 132 lb 6.4 oz (60.1 kg)   Physical Exam Vitals reviewed.  Constitutional:      Appearance: Normal appearance.     Comments: In wheelchair  Cardiovascular:     Rate and Rhythm: Normal  rate and regular rhythm.     Pulses: Normal pulses.     Heart sounds: Normal heart sounds.  Pulmonary:     Effort: Pulmonary effort is normal.      Breath sounds: Normal breath sounds.  Neurological:     General: No focal deficit present.     Mental Status: She is alert and oriented to person, place, and time.  Psychiatric:        Mood and Affect: Mood normal.        Behavior: Behavior normal.     LABORATORY DATA:  I have reviewed the labs as listed.  CBC Latest Ref Rng & Units 08/16/2021 08/09/2021 08/07/2021  WBC 4.0 - 10.5 K/uL 5.1 3.4(L) 5.4  Hemoglobin 12.0 - 15.0 g/dL 11.9(L) 10.0(L) 10.6(L)  Hematocrit 36.0 - 46.0 % 37.0 31.1(L) 32.5(L)  Platelets 150 - 400 K/uL 406(H) 304 254   CMP Latest Ref Rng & Units 08/16/2021 08/09/2021 08/07/2021  Glucose 70 - 99 mg/dL 113(H) 94 118(H)  BUN 8 - 23 mg/dL 8 5(L) 9  Creatinine 0.44 - 1.00 mg/dL 0.71 0.58 0.54  Sodium 135 - 145 mmol/L 132(L) 133(L) 131(L)  Potassium 3.5 - 5.1 mmol/L 3.5 3.2(L) 4.5  Chloride 98 - 111 mmol/L 97(L) 98 99  CO2 22 - 32 mmol/L '24 25 25  ' Calcium 8.9 - 10.3 mg/dL 9.1 8.0(L) 8.4(L)  Total Protein 6.5 - 8.1 g/dL 7.4 - 5.9(L)  Total Bilirubin 0.3 - 1.2 mg/dL 0.4 - 0.5  Alkaline Phos 38 - 126 U/L 101 - 74  AST 15 - 41 U/L 26 - 10(L)  ALT 0 - 44 U/L 18 - 11    DIAGNOSTIC IMAGING:  I have independently reviewed the scans and discussed with the patient. DG Ribs Unilateral W/Chest Right  Result Date: 07/24/2021 CLINICAL DATA:  Rib pain on right side.  Multiple myeloma. EXAM: RIGHT RIBS AND CHEST - 3+ VIEW COMPARISON:  CT 05/24/2021 and chest radiograph 06/25/2021 FINDINGS: Again noted is an expansile lucent lesion involving the posterior left seventh rib. Evidence for small surgical sutures along the periphery of the right lung. Hazy densities in the lower lungs bilaterally. Heart size is within normal limits and stable. Patient has bilateral breast implants. Mild irregularity and angulation of the right posterior eleventh rib. Difficult to exclude a subtle fracture in this area. Bilateral pedicle screw and interbody fusion in the lumbar spine which is incompletely  imaged. IMPRESSION: 1. Mild irregularity involving the posterior right eleventh rib. Cannot exclude a subtle fracture at this location. 2. Known lucent lesion involving the posterior left seventh rib. 3. Hazy densities at lung bases could be related to atelectasis. Electronically Signed   By: Markus Daft M.D.   On: 07/24/2021 10:07   CT Angio Chest PE W/Cm &/Or Wo Cm  Result Date: 08/06/2021 CLINICAL DATA:  Pulmonary embolism (PE) suspected, high prob Chest tightness, shortness of breath, fatigue and fever. Active chemotherapy for multiple myeloma. EXAM: CT ANGIOGRAPHY CHEST WITH CONTRAST TECHNIQUE: Multidetector CT imaging of the chest was performed using the standard protocol during bolus administration of intravenous contrast. Multiplanar CT image reconstructions and MIPs were obtained to evaluate the vascular anatomy. RADIATION DOSE REDUCTION: This exam was performed according to the departmental dose-optimization program which includes automated exposure control, adjustment of the mA and/or kV according to patient size and/or use of iterative reconstruction technique. CONTRAST:  76m OMNIPAQUE IOHEXOL 350 MG/ML SOLN COMPARISON:  Chest radiograph earlier today.  PET-CT 06/14/2009 FINDINGS: Cardiovascular: Mild breathing motion artifact  at the bases obscures detailed assessment. Allowing for this, There are no filling defects within the pulmonary arteries to suggest pulmonary embolus. No aortic dissection or acute aortic findings. Mild aortic atherosclerosis. Common origin of the brachiocephalic and left common carotid artery, variant arch anatomy. Upper normal heart size. No pericardial effusion. Scattered coronary artery calcifications. Mediastinum/Nodes: No enlarged mediastinal or hilar lymph nodes. No esophageal thickening. No visualized thyroid nodule. Lungs/Pleura: Mild emphysema. Chronic areas of mucoid impaction with tubular density in the right middle lobe, stable from prior PET-CT. Possible adjacent  chain suture versus calcification. Additional areas of mucoid impaction are new, particularly involving the left lower lobe where there is associated postobstructive consolidation in the lower most left lower lobe. Scattered tree-in-bud nodularity in the superior segment of the right lower lobe is unchanged from prior PET. Occasional areas of scarring, including the right lung apex. No significant pleural effusion. No ascending/pulmonary edema. No suspicious pulmonary mass. Upper Abdomen: No acute upper abdominal findings. Musculoskeletal: Compression fracture of T12 vertebra has progressed from lumbar spine MRI on 07/05/2021, now with 75% loss of height anteriorly. There is also mild T11 superior endplate compression fracture that is new. Heterogeneous appearance of marrow in the thoracic spine. Small lucent lesion in the sternal body. Lucent lesion in the left lateral eleventh rib, with possible pathologic fracture. Lucent lesion in the posterior left seventh rib. Small lucent lesion within lateral ninth rib. Bilateral breast implants. Review of the MIP images confirms the above findings. IMPRESSION: 1. No pulmonary embolus. 2. Chronic areas of mucoid impaction with tubular density in the right middle lobe, stable from prior PET-CT. Additional areas of mucoid impaction are new, particularly involving the left lower lobe where there is associated postobstructive consolidation. 3. Heterogeneous appearance of marrow in the thoracic spine with multiple lucent lesions, likely related to known multiple myeloma. Lucent lesions in the left lateral eleventh and posterior left seventh ribs, as well as ninth right rib. 4. Compression fracture of T12 vertebra has progressed from lumbar spine MRI on 07/05/2021, now with 75% loss of height anteriorly. There is also mild T11 superior endplate compression fracture that is new. Aortic Atherosclerosis (ICD10-I70.0) and Emphysema (ICD10-J43.9). Electronically Signed   By: Keith Rake M.D.   On: 08/06/2021 19:14   DG Chest Port 1 View  Result Date: 08/06/2021 CLINICAL DATA:  Cough.  Recent chemotherapy. EXAM: PORTABLE CHEST 1 VIEW COMPARISON:  Radiographs 07/23/2021 and 06/25/2021.  CT 05/24/2021. FINDINGS: 1521 hours. The heart size and mediastinal contours are stable with aortic atherosclerosis. There is chronic lung disease with asymmetric left basilar scarring, similar to previous studies. No superimposed airspace disease, edema, pneumothorax or significant pleural effusion identified. Old left-sided rib fractures, calcified breast implants and previous lumbar fusion are noted. IMPRESSION: Asymmetric left basilar scarring, similar to previous studies. No evidence of acute cardiopulmonary process. Electronically Signed   By: Richardean Sale M.D.   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 Wilburn Mylar - She reported discomfort in the left posterior back for the last 1 month.  She was seen by Dr. Chauncey Reading and x-rays showed abnormality.  A CT scan of the chest was done on 05/24/2021. - CT chest report reviewed by me from 05/24/2021 showed expansile oval-shaped lesion within the posterior aspect of the seventh rib with a nodule measuring 2.1 x 1.1 cm.  Adjacent cortex demonstrates area of thinning and destruction.  Stable emphysematous and interstitial changes within the lungs.  Stable pulmonary nodules within the right upper lobe and left upper lobe when compared to CT from September 2020. - CT of the abdomen and pelvis did not show any significant abnormalities. - She denies any fevers, night sweats or weight loss in the last 6 months.  She had history of basal cell carcinoma on the nose removed by Mohs surgery. - She also reported history of osteoporosis and broke left upper rib 1 year ago after sneezing. -Reviewed PET scan from 06/14/2021.  Mild hypermetabolism involving left aspect of  the L5 vertebral body and also pedicle region surrounding the screw.  SUV 4.2.  There appears to be a lytic process on the CT scan.  The lytic left posterior seventh rib lesion is mildly hypermetabolic with SUV 5.28.  No other definite lytic hypermetabolic bone lesions seen. - We have reviewed left seventh rib bone biopsy from 06/25/2021 consistent with plasma cell neoplasm.  Cells are positive for CD138, CD56 and a kappa restricted.  - Labs on 06/13/2021 M spike 1.7 g.  Immunofixation IgA kappa.  Free light chain shows kappa light chain 71, ratio 8.22.  LDH and beta-2 microglobulin was normal. - Bone marrow biopsy on 07/04/2021 consistent with plasma cell neoplasm with 26% plasma cells on aspirate with atypical features and 50 to 60% on core biopsy.  Chromosome analysis was normal.  Multiple myeloma FISH panel shows loss of NF1/chromosome 17/17 q.  Gain of CCN D1 or chromosome 11/11 q.  Gain of chromosome 4/4P.  Loss of material from the long-arm of chromosome 13.  These are all standard risk features. - 24-hour urine total protein to 40 mg.  Urine immunofixation was negative.  Urine kappa light chains were 4.13. - Dara RVD started on 07/26/2021.   Social/family history: - She is married and lives at home with her husband.  She has not worked but had help her husband and his work.  She is a non-smoker. - Maternal grandmother had colon cancer.  Mother had a squamous cell carcinoma of the skin.   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 - She was hospitalized from 08/06/2021 through 08/09/2021 with left lower lobe pneumonia.  She has completed outpatient Levaquin.       - She reports improvement in her cough. - As she is recovering from recent infection, I have recommended holding off on restarting treatment. - She will call as after she hears back from Dr. Reatha Armour.  We will likely restart her treatment 1 week  after vertebral procedure.  2.  Mid back pain: - Recent CT scan showed wedge fracture of T12. - I have reviewed MRI of the thoracic spine images from 08/16/2021.  It also showed superior endplate fracture of U13 along with wedge fracture of T12. - She will follow-up with Dr. Reatha Armour for possible vertebroplasty.  3.  Atypical mycobacterial infection of left lung: - She had history of pulmonary Mycobacterium abscessus infection and follows with Dr. Lorenda Cahill at [facility]. - She is on azithromycin and bedaquiline. - We will reach out to Dr. Lorenda Cahill due to her new diagnosis of multiple myeloma and the need for treatment and related immunosuppression.  4.  Myeloma bone disease: - We talked about bone strengthening agent zoledronic acid to decrease skeletal related events. - We talked about side effects including rare chance of ONJ. - We will  proceed with her first dose today.  5.  ID prophylaxis: - Continue aspirin 81 mg daily for thromboprophylaxis. - She reports nausea which coincides with the onset of starting acyclovir. - Have told her to discontinue acyclovir.  If nausea improves, we will start her on Valtrex 500 mg twice daily.   Orders placed this encounter:  No orders of the defined types were placed in this encounter.    Derek Jack, MD [place] [phone]   I, Thana Ates, am acting as a scribe for Dr. Derek Jack.  I, Derek Jack MD, have reviewed the above documentation for accuracy and completeness, and I agree with the above.

## 2021-08-16 NOTE — Progress Notes (Addendum)
Patient tolerated therapy with no complaints voiced. Labs reviewed.   Side effects with management reviewed with understanding verbalized.    Good blood return noted with peripheral IV site before Zometa infusion.  No complaints voiced during infusion with the IV site clean and dry with no bruising or swelling noted during the infusion.  No s/s of distress noted.    Peripheral IV site clean and dry with noted "puffiness" at end of the infusion.  Dr. Delton Coombes made aware with no verbal order received.  Patient denied pain at site.  No bruising noted or redness noted. Site soft to touch.  Band aid applied.    Patient instructed to use cold/warm compresses for comfort and to elevate arm as needed for swelling.    Patient left in satisfactory condition with VSS and no s/s of distress noted.

## 2021-08-16 NOTE — Progress Notes (Signed)
Patient did not receive treatment today. Nutrition appointment rescheduled. Will plan to see patient 09/13/21 during infusion.

## 2021-08-21 ENCOUNTER — Other Ambulatory Visit: Payer: Self-pay | Admitting: Neurological Surgery

## 2021-08-22 ENCOUNTER — Other Ambulatory Visit (HOSPITAL_COMMUNITY): Payer: Self-pay | Admitting: *Deleted

## 2021-08-22 ENCOUNTER — Other Ambulatory Visit: Payer: Self-pay | Admitting: Neurological Surgery

## 2021-08-22 MED ORDER — OXYCODONE HCL 10 MG PO TABS: 10.0000 mg | ORAL_TABLET | Freq: Four times a day (QID) | ORAL | 0 refills | Status: DC | PRN

## 2021-08-27 NOTE — Progress Notes (Addendum)
Surgical Instructions    Your procedure is scheduled on Friday, February 17th, 2023.   Report to [facility] Main Entrance "A" at 05:30 A.M., then check in with the Admitting office.  Call this number if you have problems the morning of surgery:  [phone]   If you have any questions prior to your surgery date call [phone]: Open Monday-Friday 8am-4pm    Remember:  Do not eat or drink after midnight the night before your surgery    Take these medicines the morning of surgery with A SIP OF WATER:   azithromycin (ZITHROMAX)  dextromethorphan-guaiFENesin (MUCINEX DM) fluticasone (FLONASE)  loratadine (CLARITIN)  omeprazole (PRILOSEC) pantoprazole (PROTONIX)   If needed:  albuterol (VENTOLIN HFA) - please, bring the inhaler with you the day of surgery benzonatate (TESSALON)  methocarbamol (ROBAXIN) ondansetron (ZOFRAN ODT) Oxycodone HCl  Follow your surgeon's instructions on when to stop Aspirin.  If no instructions were given by your surgeon then you will need to call the office to get those instructions.     As of today, STOP taking any Aspirin (unless otherwise instructed by your surgeon) Aleve, Naproxen, Ibuprofen, Motrin, Advil, Goody's, BC's, all herbal medications, fish oil, and all vitamins.    The day of surgery:          Do not wear jewelry or makeup Do not wear lotions, powders, perfumes, or deodorant. Do not shave 48 hours prior to surgery.   Do not bring valuables to the hospital. Do not wear nail polish, gel polish, artificial nails, or any other type of covering on natural nails (fingers and toes) If you have artificial nails or gel coating that need to be removed by a nail salon, please have this removed prior to surgery. Artificial nails or gel coating may interfere with anesthesia's ability to adequately monitor your vital signs.   [facility] is not responsible for any belongings or valuables. .   Do NOT Smoke (Tobacco/Vaping)  24 hours prior to  your procedure  If you use a CPAP at night, you may bring your mask for your overnight stay.   Contacts, glasses, hearing aids, dentures or partials may not be worn into surgery, please bring cases for these belongings   For patients admitted to the hospital, discharge time will be determined by your treatment team.   Patients discharged the day of surgery will not be allowed to drive home, and someone needs to stay with them for 24 hours.  NO VISITORS WILL BE ALLOWED IN PRE-OP WHERE PATIENTS ARE PREPPED FOR SURGERY.  ONLY 1 SUPPORT PERSON MAY BE PRESENT IN THE WAITING ROOM WHILE YOU ARE IN SURGERY.  IF YOU ARE TO BE ADMITTED, ONCE YOU ARE IN YOUR ROOM YOU WILL BE ALLOWED TWO (2) VISITORS. 1 (ONE) VISITOR MAY STAY OVERNIGHT BUT MUST ARRIVE TO THE ROOM BY 8pm.  Minor children may have two parents present. Special consideration for safety and communication needs will be reviewed on a case by case basis.  Special instructions:    Oral Hygiene is also important to reduce your risk of infection.  Remember - BRUSH YOUR TEETH THE MORNING OF SURGERY WITH YOUR REGULAR TOOTHPASTE   [facility]- Preparing For Surgery  Before surgery, you can play an important role. Because skin is not sterile, your skin needs to be as free of germs as possible. You can reduce the number of germs on your skin by washing with CHG (chlorahexidine gluconate) Soap before surgery.  CHG is an antiseptic cleaner which kills  germs and bonds with the skin to continue killing germs even after washing.     Please do not use if you have an allergy to CHG or antibacterial soaps. If your skin becomes reddened/irritated stop using the CHG.  Do not shave (including legs and underarms) for at least 48 hours prior to first CHG shower. It is OK to shave your face.  Please follow these instructions carefully.     Shower the NIGHT BEFORE SURGERY and the MORNING OF SURGERY with CHG Soap.   If you chose to wash your hair, wash your hair  first as usual with your normal shampoo. After you shampoo, rinse your hair and body thoroughly to remove the shampoo.  Then [employer] and genitals (private parts) with your normal soap and rinse thoroughly to remove soap.  After that Use CHG Soap as you would any other liquid soap. You can apply CHG directly to the skin and wash gently with a scrungie or a clean washcloth.   Apply the CHG Soap to your body ONLY FROM THE NECK DOWN.  Do not use on open wounds or open sores. Avoid contact with your eyes, ears, mouth and genitals (private parts). Wash Face and genitals (private parts)  with your normal soap.   Wash thoroughly, paying special attention to the area where your surgery will be performed.  Thoroughly rinse your body with warm water from the neck down.  DO NOT shower/wash with your normal soap after using and rinsing off the CHG Soap.  Pat yourself dry with a CLEAN TOWEL.  Wear CLEAN PAJAMAS to bed the night before surgery  Place CLEAN SHEETS on your bed the night before your surgery  DO NOT SLEEP WITH PETS.   Day of Surgery:  Take a shower with CHG soap. Wear Clean/Comfortable clothing the morning of surgery Do not apply any deodorants/lotions.   Remember to brush your teeth WITH YOUR REGULAR TOOTHPASTE.    Please read over the following fact sheets that you were given.

## 2021-08-28 ENCOUNTER — Other Ambulatory Visit: Payer: Self-pay

## 2021-08-28 ENCOUNTER — Encounter (HOSPITAL_COMMUNITY)
Admission: RE | Admit: 2021-08-28 | Discharge: 2021-08-28 | Disposition: A | Discharge status: Home / Self Care | Payer: Medicare Other | Source: Ambulatory Visit | Attending: Neurological Surgery | Admitting: Neurological Surgery

## 2021-08-28 ENCOUNTER — Encounter (HOSPITAL_COMMUNITY): Payer: Self-pay

## 2021-08-28 VITALS — BP 118/69 | HR 77 | Temp 98.9°F | Resp 17 | Ht 64.0 in | Wt 126.2 lb

## 2021-08-28 DIAGNOSIS — Z01812 Encounter for preprocedural laboratory examination: Secondary | ICD-10-CM | POA: Insufficient documentation

## 2021-08-28 DIAGNOSIS — J479 Bronchiectasis, uncomplicated: Secondary | ICD-10-CM | POA: Insufficient documentation

## 2021-08-28 DIAGNOSIS — K449 Diaphragmatic hernia without obstruction or gangrene: Secondary | ICD-10-CM | POA: Diagnosis not present

## 2021-08-28 DIAGNOSIS — H409 Unspecified glaucoma: Secondary | ICD-10-CM | POA: Insufficient documentation

## 2021-08-28 DIAGNOSIS — I110 Hypertensive heart disease with heart failure: Secondary | ICD-10-CM | POA: Diagnosis not present

## 2021-08-28 DIAGNOSIS — K219 Gastro-esophageal reflux disease without esophagitis: Secondary | ICD-10-CM | POA: Diagnosis not present

## 2021-08-28 DIAGNOSIS — I5032 Chronic diastolic (congestive) heart failure: Secondary | ICD-10-CM | POA: Diagnosis not present

## 2021-08-28 DIAGNOSIS — Z7982 Long term (current) use of aspirin: Secondary | ICD-10-CM | POA: Insufficient documentation

## 2021-08-28 DIAGNOSIS — M4854XA Collapsed vertebra, not elsewhere classified, thoracic region, initial encounter for fracture: Secondary | ICD-10-CM | POA: Diagnosis not present

## 2021-08-28 DIAGNOSIS — Z01818 Encounter for other preprocedural examination: Secondary | ICD-10-CM

## 2021-08-28 HISTORY — DX: Multiple myeloma not having achieved remission: C90.00

## 2021-08-28 LAB — SURGICAL PCR SCREEN
MRSA, PCR: NEGATIVE
Staphylococcus aureus: NEGATIVE

## 2021-08-28 NOTE — Progress Notes (Signed)
PCP - Dr. Margo Aye Cardiologist - Dr. Donn Pierini  PPM/ICD - n/a Device Orders -  Rep Notified -   Chest x-ray - 08/06/21 EKG - 08/07/21; abnormal Stress Test - patient states she has had a stress test in the last few years but cannot remember when.  Records requested from cardiologist ECHO - patient states she has had an echo in the last few years but cannot remember when.  Records requested from cardiologist Cardiac Cath - patient denies  Sleep Study - patient denies CPAP -   Fasting Blood Sugar - n/a Checks Blood Sugar _____ times a day  Blood Thinner Instructions: n/a Aspirin Instructions:last dose 08/15/21  ERAS Protcol - NPO after midnight PRE-SURGERY Ensure or G2-   COVID TEST- ambulatory surgery   Anesthesia review: yes  Patient denies shortness of breath, fever, cough and chest pain at PAT appointment   All instructions explained to the patient, with a verbal understanding of the material. Patient agrees to go over the instructions while at home for a better understanding. Patient also instructed to self quarantine after being tested for COVID-19. The opportunity to ask questions was provided.

## 2021-08-29 NOTE — Anesthesia Preprocedure Evaluation (Addendum)
Anesthesia Evaluation  Patient identified by MRN, date of birth, ID band Patient awake    Reviewed: Allergy & Precautions, NPO status , Patient's Chart, lab work & pertinent test results  History of Anesthesia Complications (+) PONV  Airway Mallampati: II  TM Distance: >3 FB Neck ROM: Full    Dental  (+) Teeth Intact, Dental Advisory Given   Pulmonary shortness of breath, COPD,  COPD inhaler,    breath sounds clear to auscultation       Cardiovascular hypertension, Pt. on medications (-) angina Rhythm:Regular Rate:Normal     Neuro/Psych Glaucoma T compression fracture    GI/Hepatic Neg liver ROS, GERD  Medicated and Controlled,  Endo/Other  negative endocrine ROS  Renal/GU negative Renal ROS     Musculoskeletal   Abdominal   Peds  Hematology Multiple myeloma   Anesthesia Other Findings   Reproductive/Obstetrics                           Anesthesia Physical Anesthesia Plan  ASA: 3  Anesthesia Plan: General   Post-op Pain Management: Tylenol PO (pre-op)*   Induction: Intravenous  PONV Risk Score and Plan: 4 or greater and Dexamethasone and Treatment may vary due to age or medical condition  Airway Management Planned: Oral ETT  Additional Equipment: None  Intra-op Plan:   Post-operative Plan: Extubation in OR  Informed Consent: I have reviewed the patients History and Physical, chart, labs and discussed the procedure including the risks, benefits and alternatives for the proposed anesthesia with the patient or authorized representative who has indicated his/her understanding and acceptance.     Dental advisory given  Plan Discussed with: CRNA and Surgeon  Anesthesia Plan Comments: (PAT note written 08/29/2021 by Myra Gianotti, PA-C. )      Anesthesia Quick Evaluation

## 2021-08-29 NOTE — Progress Notes (Addendum)
Anesthesia Chart Review:  Case: [id number] Date/Time: 08/31/21 0715   Procedure: KYPHOPLASTY T11-T12   Anesthesia type: General   Pre-op diagnosis: COMPRESSION FRACTURE OF T12 VERTEBR   Location: [facility] OR ROOM 42 / [facility]   Surgeons: DawleyTheodoro Doing, DO       DISCUSSION: Patient is a 71 year old female scheduled for the above procedure.  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 with intolerances to multiple medications, currently managed on azithromycin and bedaquiline), dyspnea, aortic insufficiency (mild 06/2018), chronic diastolic CHF, venous insufficiency (s/p RFA ablations x3: RGSV, RSSV, LSSV, 2016), diverticulitis (s/p partial colectomy 06/26/17), skin cancer (s/p BCC excision 06/2018), multiple myeloma (06/25/21 left 7th rib biopsy: plasma cell neoplasm; 07/04/21 bone marrow biopsy: plasma cell neoplasm; 07/17/21 diagnosed with stage 1 IgA kappa plasma cell myeloma 07/17/21, started on Daratumumab SQ Q 12 days x 6 cycles), spinal surgery (L5-S1 ALIF, L2-5 anterolateral fusion 12/29/18), incisional hernia repair (10/08/19). She is on ASA 81 mg for thromboprophylaxis per [facility]-ONC.  [facility] ([facility]) admission 08/06/21-08/09/21 for cough and low grade fever in setting of recent chemotherapy for new diagnosis of multiple myeloma and history of underlying . CXR showed no acute process. CTA showed no PE but chronic areas or mucoid impaction with tubular density in the right middle lobe, stable from prior PET/CT with additional areas of mucoid impaction involving the left lower lobe with associated post obstructive consolidation that was new since 06/14/21 PET scan.  Imaging also showed worsening compression fracture T12. Respiratory panel negative.  WBC normal.  Blood cultures negative. She was treated for LLL PNA with Initially started on vancomycin, cefepime while awaiting cultures, but then changed  Levaquin x 8 days. Referred to neurosurgery for consideration of vertebroplasty T12 compression fracture.   Following discharge, she notified Dr. Lorenda Cahill of new diagnosis and hospitalization. On 08/13/21, he advised she complete prescribed levofloxacin and then resume azithromycin along with the bedaquiline. He plans to see her in ~ 2 months.   Last oncology visit with Dr. Delton Coombes 08/16/21. Daratumumab on hold since she had PNA in January with anticipated plans to restart treatment 1 week after vertebroplasty.   She denied shortness of breath, chest pain, cough, fever at PAT RN visit. O2 sat 94%. Reviewed history and last month's admission with anesthesiologist Adele Barthel, MD. Patient with recent diagnosis of multiple myeloma and has worsening compression fractures. Anesthesia team to evaluate on the day of surgery.   UPDATE 08/30/21 12:19 PM: [facility] records received. Last visit with Dr. Sondra Come on 03/14/21. Six month follow-up planned. She had a stress and echo in 2021 which are outlined below.  Note also mentions that she seen [facility]monologist Fahid Alghanim, MD.    VS: BP 118/69    Pulse 77    Temp 37.2 C (Oral)    Resp 17    Ht '5\' 4"'  (1.626 m)    Wt 57.2 kg    SpO2 94%    BMI 21.66 kg/m    PROVIDERS: [place], Jenetta Downer, MD is PCP ([place], [place]) - Donn Pierini, MD is cardiologist ([place]). Latest records requested, but available records scanned under Media tab are from 07/20/18 office visit for HTN, venous insufficiency, PVD and mild AI (06/2018). See my previous APP note. Dr. Sondra Come did not think she required ischemic testing prior to 2020 lumbar fusion.  - Linde Gillis, MD is pulmonologist ([facility];  [phone]) Brigitte Pulse, MD is ID ([facility]). Last visit 05/14/21.  Derek Jack, MD is HEM-ONC   LABS: Most recent labs results from [facility] include: Lab Results  Component Value Date   WBC 5.1  08/16/2021   HGB 11.9 (L) 08/16/2021   HCT 37.0 08/16/2021   PLT 406 (H) 08/16/2021   GLUCOSE 113 (H) 08/16/2021   ALT 18 08/16/2021   AST 26 08/16/2021   NA 132 (L) 08/16/2021   K 3.5 08/16/2021   CL 97 (L) 08/16/2021   CREATININE 0.71 08/16/2021   BUN 8 08/16/2021   CO2 24 08/16/2021   TSH 1.132 01/19/2019     IMAGES: MRI T-spine 08/16/21: IMPRESSION: 1. Recent T11 compression fracture with mild height loss. No evidence of underlying lesion. 2. More remote but still un healed T12 compression fracture with advanced height loss and mild, noncompressive retropulsion. 3. Small bone lesion in the T5 left articular process. Known left posterior seventh rib lesion. These likely relate to history of multiple myeloma.   CTA Chest 08/06/21: IMPRESSION: 1. No pulmonary embolus. 2. Chronic areas of mucoid impaction with tubular density in the right middle lobe, stable from prior PET-CT. Additional areas of mucoid impaction are new, particularly involving the left lower lobe where there is associated postobstructive consolidation. 3. Heterogeneous appearance of marrow in the thoracic spine with multiple lucent lesions, likely related to known multiple myeloma. Lucent lesions in the left lateral eleventh and posterior left seventh ribs, as well as ninth right rib. 4. Compression fracture of T12 vertebra has progressed from lumbar spine MRI on 07/05/2021, now with 75% loss of height anteriorly. There is also mild T11 superior endplate compression fracture that is new. - Aortic Atherosclerosis (ICD10-I70.0) and Emphysema (ICD10-J43.9).  1V PCXR 08/06/21: IMPRESSION: Asymmetric left basilar scarring, similar to previous studies. No evidence of acute cardiopulmonary process.   MRI L-spine 07/05/21: IMPRESSION: - Mild superior endplate fracture E32 appears recent and benign. No other fracture - Satisfactory fusion L1 through S1. - Disc degeneration and spurring L1-2 without  significant stenosis.   EKG: 08/07/21 (done in ED) Sinus rhythm Aberrant conduction of SV complex(es) Probable left atrial enlargement Low voltage, extremity and precordial leads ST elevation, consider inferior injury Interpretation limited secondary to artifact Confirmed by Fredia Sorrow [phone]) on 08/06/2021 4:02:05 PM - Baseline wander worse in leads I, II but affects all leads to some degree. I think reading of inferior ST elevation is likely related to baseline wanderer and artifact. HS Troponin negative x2 at that visit.      EKG 03/14/21 ([facility]): Sinus rhythm.  Low voltage in precordial leads.  Poor R wave progression, may be secondary to pulmonary disease, consider old anterior infarct.  Negative precordial T waves.   CV: Echo 06/27/20 ([facility]): Impression: 1.  Normal LV size and normal systolic function.  The ejection fraction is 60 to 65%.  Grade 1 diastolic dysfunction.  There is normal left ventricular wall thickness. 2.  Normal right ventricular size and normal function. 3.  Abnormal, trileaflet aortic valve.  There is mild aortic regurgitation and aortic valve sclerosis. 4.  There is mild mitral regurgitation. 5.  Normal pulmonary artery systolic pressure. 6.  There is mild pulmonic regurgitation. - Comparison [facility] echo 07/10/18: LVEF 60-65%, grade 1 DD, mild AR, mild MR, PASP 18 mmHg, mild PR.  Nuclear stress test 06/29/20 ([facility]): Impression: 1.  Normal perfusion.  No ischemia. 2.  There is normal LV wall motion and function  by visual estimation.  Ejection fraction by computer 49%.    Past Medical History:  Diagnosis Date   Arthritis    osteoarthritis   Back pain    Bronchiectasis (HCC)    Cancer (HCC)    basal cell nose   Chronic cough    Complication of anesthesia    Dyspnea    Gastrointestinal symptoms    GERD (gastroesophageal reflux disease)    Glaucoma    History of hiatal hernia    Hyperlipemia    Hypertension    Multiple myeloma  (HCC)    Osteopenia    Pneumonia    PONV (postoperative nausea and vomiting)    "only one time"   Spondylisthesis     Past Surgical History:  Procedure Laterality Date   ABDOMINAL EXPOSURE N/A 12/29/2018   Procedure: ABDOMINAL EXPOSURE;  Surgeon: Angelia Mould, MD;  Location: [facility];  Service: Vascular;  Laterality: N/A;   ABDOMINOPLASTY  2002   ABDOMINOPLASTY  1970's   ANKLE SURGERY Left 10/2015   ANTERIOR LATERAL LUMBAR FUSION WITH PERCUTANEOUS SCREW 3 LEVEL Left 12/29/2018   Procedure: Lumbar Two to Lumbar Five Anterolateral lumbar interbody fusion;  Surgeon: Erline Levine, MD;  Location: [facility];  Service: Neurosurgery;  Laterality: Left;  anterolateral   ANTERIOR LUMBAR FUSION N/A 12/29/2018   Procedure: Lumbar Five Sacral One Anterior lumbar interbody fusion;  Surgeon: Erline Levine, MD;  Location: [facility];  Service: Neurosurgery;  Laterality: N/A;  anterior approach   BASAL CELL CARCINOMA EXCISION  06/2018   nose   BLEPHAROPLASTY Bilateral 1999   BREAST ENHANCEMENT SURGERY  1970's   COLONOSCOPY     CYSTOURETHROSCOPY  2018   Doble J ureteal stents   DECOMPRESSION CORE HIP Left 2014   FACIAL COSMETIC SURGERY  2004   FLUOROSCOPY GUIDANCE     HIP ARTHROPLASTY Left    INCISIONAL HERNIA REPAIR N/A 10/08/2019   Procedure: OPEN INCISIONAL HERNIA REPAIR WITH MESH;  Surgeon: Armandina Gemma, MD;  Location: [facility];  Service: General;  Laterality: N/A;   JOINT REPLACEMENT Left    Hip; 05/2014   LAPAROSCOPIC PARTIAL COLECTOMY  06/26/2017   LUMBAR PERCUTANEOUS PEDICLE SCREW 4 LEVEL N/A 12/29/2018   Procedure: Lumbar Percutaneous Pedicle Screw Placement Lumbar two-Sacral one;  Surgeon: Erline Levine, MD;  Location: [facility];  Service: Neurosurgery;  Laterality: N/A;   MOHS SURGERY  2019   nose   PARTIAL COLECTOMY  06/2017   for diverticulitis   REMOVAL OF BILATERAL TISSUE EXPANDERS WITH PLACEMENT OF BILATERAL BREAST IMPLANTS  2004   THIGH LIFT  1994   TOE FUSION Right    great toe    TONSILLECTOMY     UMBILICAL HERNIA REPAIR     with tummy tuck   UMBILICAL HERNIA REPAIR  2002    MEDICATIONS:  acyclovir (ZOVIRAX) 400 MG tablet   albuterol (VENTOLIN HFA) 108 (90 Base) MCG/ACT inhaler   aspirin EC 81 MG tablet   azithromycin (ZITHROMAX) 250 MG tablet   Bedaquiline Fumarate (SIRTURO) 100 MG TABS   benzonatate (TESSALON) 100 MG capsule   Biotin 1000 MCG tablet   calcitonin, salmon, (MIACALCIN/FORTICAL) 200 UNIT/ACT nasal spray   Calcium Carb-Cholecalciferol 600-800 MG-UNIT TABS   daratumumab-hyaluronidase-fihj (DARZALEX FASPRO) 1800-30000 MG-UT/15ML SOLN   dexamethasone (DECADRON) 2 MG tablet   dextromethorphan-guaiFENesin (MUCINEX DM) 30-600 MG 12hr tablet   estrogen, conjugated,-medroxyprogesterone (PREMPRO) 0.45-1.5 MG tablet   feeding supplement (ENSURE ENLIVE / ENSURE PLUS) LIQD   fluticasone (FLONASE) 50 MCG/ACT nasal spray  hydrochlorothiazide (HYDRODIURIL) 25 MG tablet   Lactulose 20 GM/30ML SOLN   latanoprost (XALATAN) 0.005 % ophthalmic solution   linaclotide (LINZESS) 145 MCG CAPS capsule   lisinopril (ZESTRIL) 20 MG tablet   loratadine (CLARITIN) 10 MG tablet   methocarbamol (ROBAXIN) 500 MG tablet   omeprazole (PRILOSEC) 20 MG capsule   ondansetron (ZOFRAN ODT) 4 MG disintegrating tablet   Oxycodone HCl 10 MG TABS   pantoprazole (PROTONIX) 40 MG tablet   SUMAtriptan (IMITREX) 100 MG tablet   No current facility-administered medications for this encounter.  Last ASA 08/15/21.   Myra Gianotti, PA-C Surgical Short Stay/Anesthesiology [facility] Phone [phone] [facility] Phone [phone] 08/29/2021 4:30 PM

## 2021-08-31 ENCOUNTER — Encounter (HOSPITAL_COMMUNITY)
Admission: RE | Disposition: A | Discharge status: Home / Self Care | Payer: Self-pay | Source: Ambulatory Visit | Attending: Neurological Surgery

## 2021-08-31 ENCOUNTER — Ambulatory Visit (HOSPITAL_COMMUNITY): Payer: Medicare Other | Admitting: Vascular Surgery

## 2021-08-31 ENCOUNTER — Ambulatory Visit (HOSPITAL_COMMUNITY): Payer: Medicare Other

## 2021-08-31 ENCOUNTER — Other Ambulatory Visit: Payer: Self-pay

## 2021-08-31 ENCOUNTER — Ambulatory Visit (HOSPITAL_BASED_OUTPATIENT_CLINIC_OR_DEPARTMENT_OTHER): Payer: Medicare Other | Admitting: Anesthesiology

## 2021-08-31 ENCOUNTER — Other Ambulatory Visit (HOSPITAL_COMMUNITY): Payer: Self-pay

## 2021-08-31 ENCOUNTER — Encounter (HOSPITAL_COMMUNITY): Payer: Self-pay | Admitting: Neurological Surgery

## 2021-08-31 ENCOUNTER — Ambulatory Visit (HOSPITAL_COMMUNITY)
Admission: RE | Admit: 2021-08-31 | Discharge: 2021-08-31 | Disposition: A | Discharge status: Home / Self Care | Payer: Medicare Other | Source: Ambulatory Visit | Attending: Neurological Surgery | Admitting: Neurological Surgery

## 2021-08-31 DIAGNOSIS — C9000 Multiple myeloma not having achieved remission: Secondary | ICD-10-CM

## 2021-08-31 DIAGNOSIS — M8448XA Pathological fracture, other site, initial encounter for fracture: Secondary | ICD-10-CM

## 2021-08-31 DIAGNOSIS — M8458XA Pathological fracture in neoplastic disease, other specified site, initial encounter for fracture: Secondary | ICD-10-CM | POA: Diagnosis not present

## 2021-08-31 DIAGNOSIS — H409 Unspecified glaucoma: Secondary | ICD-10-CM | POA: Diagnosis not present

## 2021-08-31 DIAGNOSIS — J449 Chronic obstructive pulmonary disease, unspecified: Secondary | ICD-10-CM | POA: Diagnosis not present

## 2021-08-31 DIAGNOSIS — I10 Essential (primary) hypertension: Secondary | ICD-10-CM | POA: Diagnosis not present

## 2021-08-31 DIAGNOSIS — Z419 Encounter for procedure for purposes other than remedying health state, unspecified: Secondary | ICD-10-CM

## 2021-08-31 DIAGNOSIS — K219 Gastro-esophageal reflux disease without esophagitis: Secondary | ICD-10-CM | POA: Insufficient documentation

## 2021-08-31 DIAGNOSIS — Z79899 Other long term (current) drug therapy: Secondary | ICD-10-CM | POA: Insufficient documentation

## 2021-08-31 HISTORY — PX: KYPHOPLASTY: SHX5884

## 2021-08-31 IMAGING — RF DG THORACOLUMBAR SPINE 2V
2 series · 2 of 2 positions shown · non-contrast
Comparison: [DATE].

CLINICAL DATA: T11 and T12 kyphoplasty.

EXAM:
THORACOLUMBAR SPINE 1V
Radiation exposure index: 12.86 mGy.

[Series 1: run · 1 of 1 slices shown (1 of 2)]
[im 1/1]
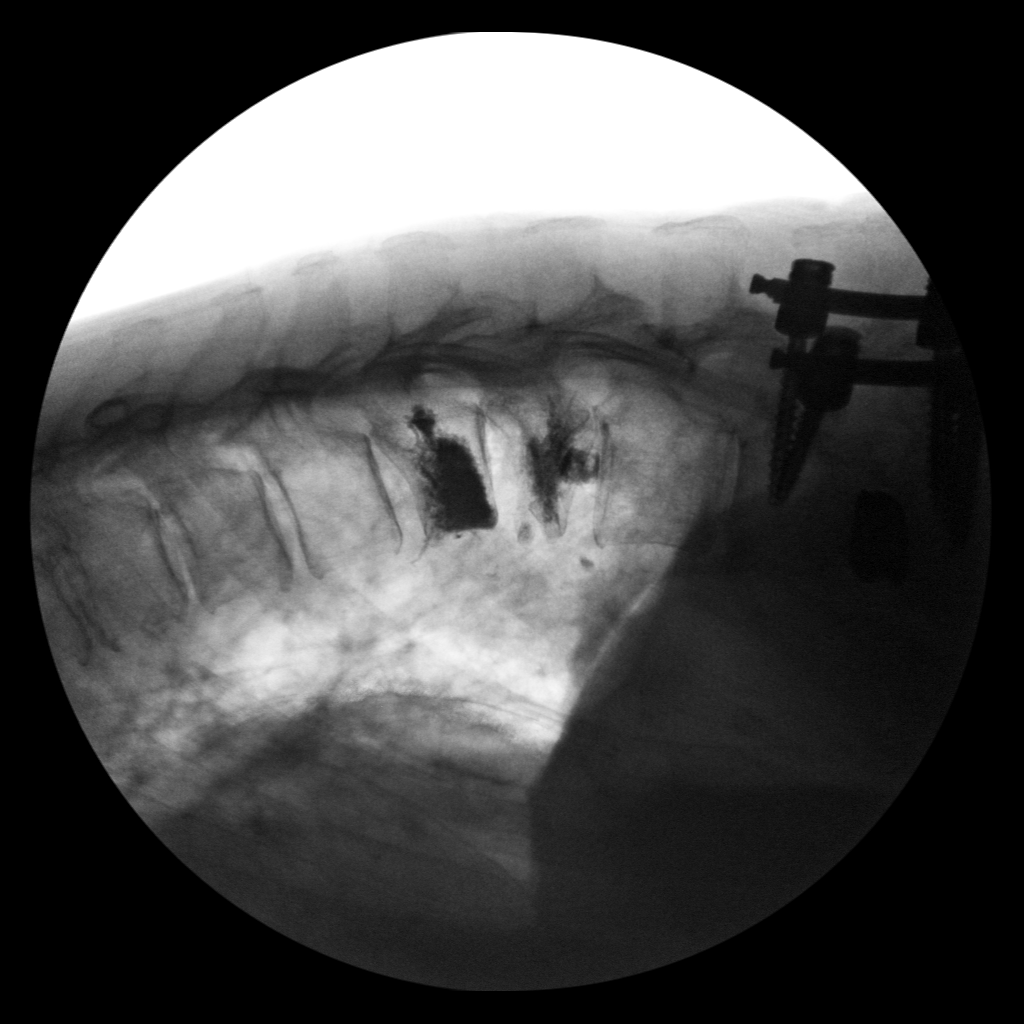

[Series 1: run · 1 of 1 slices shown (2 of 2)]
[im 1/1]
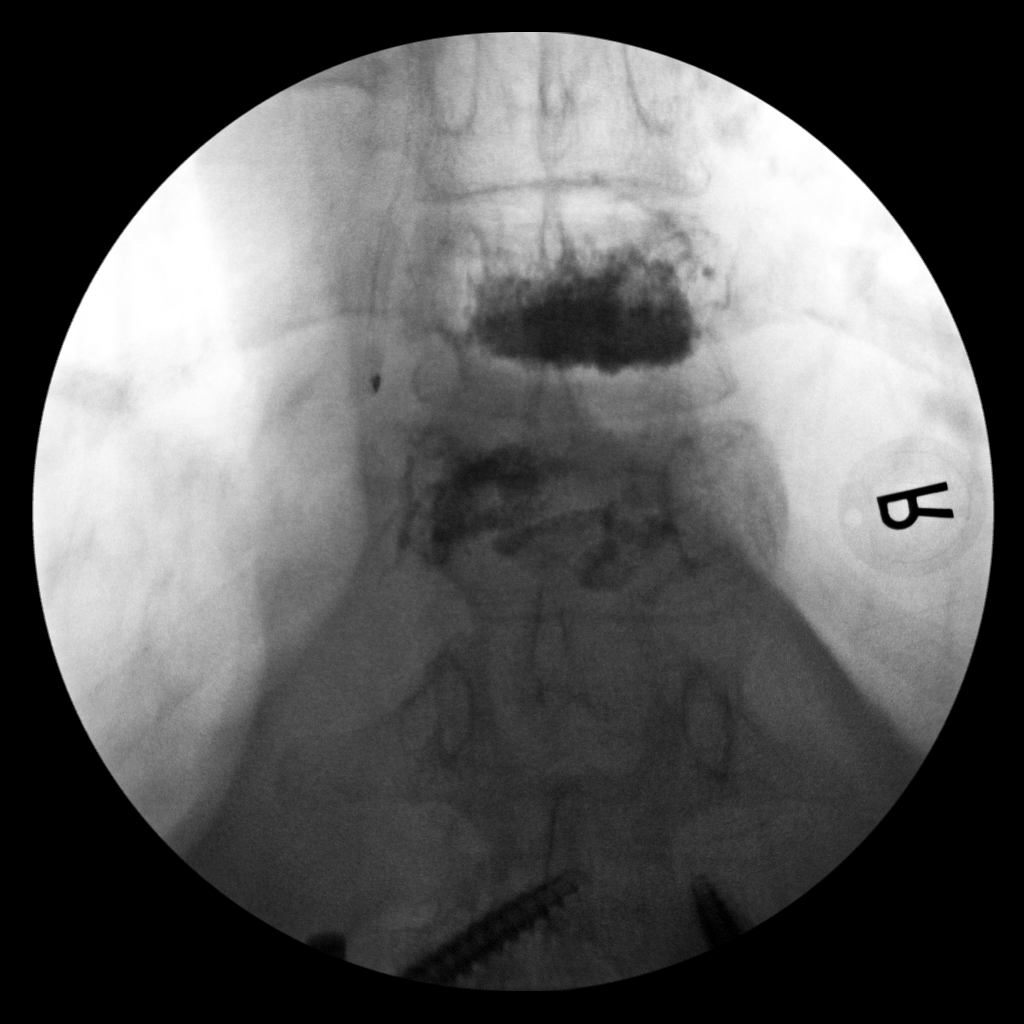

[2 of 2 positions shown; findings below may reference images not displayed]

FINDINGS: Two intraoperative fluoroscopic images were obtained of the
thoracolumbar spine. These demonstrate the patient be status post
kyphoplasty of the T11 and T12 vertebral bodies.
IMPRESSION: Fluoroscopic guidance provided during T11 and T12 kyphoplasty.

## 2021-08-31 SURGERY — KYPHOPLASTY
Anesthesia: General | Site: Back

## 2021-08-31 MED ORDER — CHLORHEXIDINE GLUCONATE 0.12 % MT SOLN: 15.0000 mL | Freq: Once | OROMUCOSAL | Status: AC

## 2021-08-31 MED ORDER — LIDOCAINE 2% (20 MG/ML) 5 ML SYRINGE
INTRAMUSCULAR | Status: AC
  Filled 2021-08-31: qty 5

## 2021-08-31 MED ORDER — PROPOFOL 10 MG/ML IV BOLUS
INTRAVENOUS | Status: DC | PRN
Start: 2021-08-31 — End: 2021-08-31
  Administered 2021-08-31: 120 mg via INTRAVENOUS

## 2021-08-31 MED ORDER — ROCURONIUM BROMIDE 10 MG/ML (PF) SYRINGE
PREFILLED_SYRINGE | INTRAVENOUS | Status: DC | PRN
  Administered 2021-08-31: 60 mg via INTRAVENOUS

## 2021-08-31 MED ORDER — VANCOMYCIN HCL IN DEXTROSE 1-5 GM/200ML-% IV SOLN
1000.0000 mg | INTRAVENOUS | Status: AC
  Administered 2021-08-31: 1000 mg via INTRAVENOUS

## 2021-08-31 MED ORDER — EPHEDRINE 5 MG/ML INJ
INTRAVENOUS | Status: AC
  Filled 2021-08-31: qty 5

## 2021-08-31 MED ORDER — MIDAZOLAM HCL 2 MG/2ML IJ SOLN
INTRAMUSCULAR | Status: DC | PRN
  Administered 2021-08-31: 2 mg via INTRAVENOUS

## 2021-08-31 MED ORDER — PHENYLEPHRINE 40 MCG/ML (10ML) SYRINGE FOR IV PUSH (FOR BLOOD PRESSURE SUPPORT)
PREFILLED_SYRINGE | INTRAVENOUS | Status: DC | PRN
  Administered 2021-08-31: 120 ug via INTRAVENOUS
  Administered 2021-08-31: 80 ug via INTRAVENOUS

## 2021-08-31 MED ORDER — FENTANYL CITRATE (PF) 100 MCG/2ML IJ SOLN: 25.0000 ug | INTRAMUSCULAR | Status: DC | PRN

## 2021-08-31 MED ORDER — OXYCODONE HCL 5 MG PO TABS
10.0000 mg | ORAL_TABLET | Freq: Once | ORAL | Status: AC
  Administered 2021-08-31: 10 mg via ORAL

## 2021-08-31 MED ORDER — DEXAMETHASONE SODIUM PHOSPHATE 10 MG/ML IJ SOLN
INTRAMUSCULAR | Status: DC | PRN
Start: 2021-08-31 — End: 2021-08-31
  Administered 2021-08-31: 10 mg via INTRAVENOUS

## 2021-08-31 MED ORDER — MIDAZOLAM HCL 2 MG/2ML IJ SOLN
INTRAMUSCULAR | Status: AC
  Filled 2021-08-31: qty 2

## 2021-08-31 MED ORDER — LIDOCAINE-EPINEPHRINE 1 %-1:100000 IJ SOLN
INTRAMUSCULAR | Status: DC | PRN
  Administered 2021-08-31: 13.5 mL

## 2021-08-31 MED ORDER — ROCURONIUM BROMIDE 10 MG/ML (PF) SYRINGE
PREFILLED_SYRINGE | INTRAVENOUS | Status: AC
  Filled 2021-08-31: qty 10

## 2021-08-31 MED ORDER — IOHEXOL 300 MG/ML  SOLN
INTRAMUSCULAR | Status: DC | PRN
  Administered 2021-08-31: 100 mL

## 2021-08-31 MED ORDER — PHENYLEPHRINE 40 MCG/ML (10ML) SYRINGE FOR IV PUSH (FOR BLOOD PRESSURE SUPPORT)
PREFILLED_SYRINGE | INTRAVENOUS | Status: AC
  Filled 2021-08-31: qty 10

## 2021-08-31 MED ORDER — PROMETHAZINE HCL 25 MG/ML IJ SOLN
INTRAMUSCULAR | Status: AC
  Filled 2021-08-31: qty 1

## 2021-08-31 MED ORDER — ORAL CARE MOUTH RINSE: 15.0000 mL | Freq: Once | OROMUCOSAL | Status: AC

## 2021-08-31 MED ORDER — CHLORHEXIDINE GLUCONATE 0.12 % MT SOLN
OROMUCOSAL | Status: AC
  Administered 2021-08-31: 15 mL via OROMUCOSAL
  Filled 2021-08-31: qty 15

## 2021-08-31 MED ORDER — FENTANYL CITRATE (PF) 250 MCG/5ML IJ SOLN
INTRAMUSCULAR | Status: DC | PRN
Start: 2021-08-31 — End: 2021-08-31
  Administered 2021-08-31: 100 ug via INTRAVENOUS
  Administered 2021-08-31: 50 ug via INTRAVENOUS

## 2021-08-31 MED ORDER — LIDOCAINE-EPINEPHRINE 1 %-1:100000 IJ SOLN
INTRAMUSCULAR | Status: AC
  Filled 2021-08-31: qty 1

## 2021-08-31 MED ORDER — VANCOMYCIN HCL IN DEXTROSE 1-5 GM/200ML-% IV SOLN
INTRAVENOUS | Status: AC
Start: 2021-08-31 — End: 2021-08-31
  Filled 2021-08-31: qty 200

## 2021-08-31 MED ORDER — PROMETHAZINE HCL 25 MG/ML IJ SOLN
6.2500 mg | Freq: Once | INTRAMUSCULAR | Status: AC
  Administered 2021-08-31: 6.25 mg via INTRAVENOUS

## 2021-08-31 MED ORDER — DEXAMETHASONE SODIUM PHOSPHATE 10 MG/ML IJ SOLN
INTRAMUSCULAR | Status: AC
  Filled 2021-08-31: qty 1

## 2021-08-31 MED ORDER — BUPIVACAINE-EPINEPHRINE 0.25% -1:200000 IJ SOLN
INTRAMUSCULAR | Status: DC | PRN
  Administered 2021-08-31: 13.5 mL

## 2021-08-31 MED ORDER — PROPOFOL 10 MG/ML IV BOLUS
INTRAVENOUS | Status: AC
  Filled 2021-08-31: qty 20

## 2021-08-31 MED ORDER — ONDANSETRON HCL 4 MG/2ML IJ SOLN
INTRAMUSCULAR | Status: DC | PRN
Start: 2021-08-31 — End: 2021-08-31
  Administered 2021-08-31: 4 mg via INTRAVENOUS

## 2021-08-31 MED ORDER — ONDANSETRON HCL 4 MG/2ML IJ SOLN
INTRAMUSCULAR | Status: AC
  Filled 2021-08-31: qty 2

## 2021-08-31 MED ORDER — OXYCODONE HCL 5 MG PO TABS
ORAL_TABLET | ORAL | Status: AC
  Filled 2021-08-31: qty 2

## 2021-08-31 MED ORDER — CHLORHEXIDINE GLUCONATE CLOTH 2 % EX PADS: 6.0000 | MEDICATED_PAD | Freq: Once | CUTANEOUS | Status: DC

## 2021-08-31 MED ORDER — EPHEDRINE SULFATE-NACL 50-0.9 MG/10ML-% IV SOSY
PREFILLED_SYRINGE | INTRAVENOUS | Status: DC | PRN
  Administered 2021-08-31: 10 mg via INTRAVENOUS

## 2021-08-31 MED ORDER — LIDOCAINE 2% (20 MG/ML) 5 ML SYRINGE
INTRAMUSCULAR | Status: DC | PRN
  Administered 2021-08-31: 40 mg via INTRAVENOUS

## 2021-08-31 MED ORDER — ACETAMINOPHEN 500 MG PO TABS
ORAL_TABLET | ORAL | Status: AC
  Administered 2021-08-31: 1000 mg via ORAL
  Filled 2021-08-31: qty 2

## 2021-08-31 MED ORDER — SUGAMMADEX SODIUM 200 MG/2ML IV SOLN
INTRAVENOUS | Status: DC | PRN
  Administered 2021-08-31: 200 mg via INTRAVENOUS

## 2021-08-31 MED ORDER — LACTATED RINGERS IV SOLN: INTRAVENOUS | Status: DC

## 2021-08-31 MED ORDER — FENTANYL CITRATE (PF) 250 MCG/5ML IJ SOLN
INTRAMUSCULAR | Status: AC
  Filled 2021-08-31: qty 5

## 2021-08-31 MED ORDER — 0.9 % SODIUM CHLORIDE (POUR BTL) OPTIME
TOPICAL | Status: DC | PRN
  Administered 2021-08-31: 1000 mL

## 2021-08-31 MED ORDER — BUPIVACAINE-EPINEPHRINE (PF) 0.25% -1:200000 IJ SOLN
INTRAMUSCULAR | Status: AC
  Filled 2021-08-31: qty 30

## 2021-08-31 MED ORDER — ACETAMINOPHEN 500 MG PO TABS: 1000.0000 mg | ORAL_TABLET | Freq: Once | ORAL | Status: AC

## 2021-08-31 SURGICAL SUPPLY — 43 items
BAG COUNTER SPONGE SURGICOUNT (BAG) ×2 IMPLANT
BENZOIN TINCTURE PRP APPL 2/3 (GAUZE/BANDAGES/DRESSINGS) ×1 IMPLANT
BLADE SURG 15 STRL LF DISP TIS (BLADE) ×1 IMPLANT
BLADE SURG 15 STRL SS (BLADE) ×1
CEMENT KYPHON C01A KIT/MIXER (Cement) ×1 IMPLANT
COVER MAYO STAND STRL (DRAPES) ×1 IMPLANT
DECANTER SPIKE VIAL GLASS SM (MISCELLANEOUS) ×1 IMPLANT
DERMABOND ADVANCED (GAUZE/BANDAGES/DRESSINGS) ×1
DERMABOND ADVANCED .7 DNX12 (GAUZE/BANDAGES/DRESSINGS) ×1 IMPLANT
DRAPE C-ARM 42X72 X-RAY (DRAPES) ×2 IMPLANT
DRAPE INCISE IOBAN 66X45 STRL (DRAPES) ×1 IMPLANT
DRAPE LAPAROTOMY 100X72X124 (DRAPES) ×2 IMPLANT
DRAPE SURG 17X23 STRL (DRAPES) ×1 IMPLANT
DRAPE WARM FLUID 44X44 (DRAPES) ×2 IMPLANT
DRSG OPSITE POSTOP 3X4 (GAUZE/BANDAGES/DRESSINGS) ×2 IMPLANT
DURAPREP 26ML APPLICATOR (WOUND CARE) ×2 IMPLANT
GAUZE 4X4 16PLY ~~LOC~~+RFID DBL (SPONGE) ×1 IMPLANT
GAUZE SPONGE 4X4 12PLY STRL (GAUZE/BANDAGES/DRESSINGS) ×1 IMPLANT
GLOVE SRG 8 PF TXTR STRL LF DI (GLOVE) ×1 IMPLANT
GLOVE SURG LTX SZ8 (GLOVE) ×2 IMPLANT
GLOVE SURG UNDER POLY LF SZ8 (GLOVE) ×1
GOWN STRL REUS W/ TWL LRG LVL3 (GOWN DISPOSABLE) IMPLANT
GOWN STRL REUS W/ TWL XL LVL3 (GOWN DISPOSABLE) ×1 IMPLANT
GOWN STRL REUS W/TWL 2XL LVL3 (GOWN DISPOSABLE) IMPLANT
GOWN STRL REUS W/TWL LRG LVL3 (GOWN DISPOSABLE) ×2
GOWN STRL REUS W/TWL XL LVL3 (GOWN DISPOSABLE) ×1
KIT BASIN OR (CUSTOM PROCEDURE TRAY) ×2 IMPLANT
KIT POSITION SURG JACKSON T1 (MISCELLANEOUS) ×2 IMPLANT
KIT TURNOVER KIT B (KITS) ×2 IMPLANT
NDL SPNL 22GX3.5 QUINCKE BK (NEEDLE) IMPLANT
NEEDLE HYPO 22GX1.5 SAFETY (NEEDLE) ×2 IMPLANT
NEEDLE SPNL 22GX3.5 QUINCKE BK (NEEDLE) ×2 IMPLANT
NS IRRIG 1000ML POUR BTL (IV SOLUTION) ×2 IMPLANT
PACK SURGICAL SETUP 50X90 (CUSTOM PROCEDURE TRAY) ×2 IMPLANT
PAD ARMBOARD 7.5X6 YLW CONV (MISCELLANEOUS) ×6 IMPLANT
SPONGE T-LAP 4X18 ~~LOC~~+RFID (SPONGE) ×1 IMPLANT
STAPLER VISISTAT 35W (STAPLE) ×2 IMPLANT
STRIP CLOSURE SKIN 1/2X4 (GAUZE/BANDAGES/DRESSINGS) ×1 IMPLANT
SUT VIC AB 3-0 SH 8-18 (SUTURE) ×1 IMPLANT
TOWEL GREEN STERILE (TOWEL DISPOSABLE) ×1 IMPLANT
TOWEL GREEN STERILE FF (TOWEL DISPOSABLE) IMPLANT
TRAY KYPHOPAK 15/3 ONESTEP 1ST (MISCELLANEOUS) ×1 IMPLANT
WATER STERILE IRR 1000ML POUR (IV SOLUTION) ×2 IMPLANT

## 2021-08-31 NOTE — Anesthesia Procedure Notes (Signed)
Procedure Name: Intubation Date/Time: 08/31/2021 7:50 AM Performed by: Lance Coon, CRNA Pre-anesthesia Checklist: Patient identified, Emergency Drugs available, Suction available, Patient being monitored and Timeout performed Patient Re-evaluated:Patient Re-evaluated prior to induction Oxygen Delivery Method: Circle system utilized Preoxygenation: Pre-oxygenation with 100% oxygen Induction Type: IV induction Ventilation: Mask ventilation without difficulty Laryngoscope Size: Miller and 3 Grade View: Grade II Tube type: Oral Tube size: 7.0 mm Number of attempts: 1 Airway Equipment and Method: Stylet Placement Confirmation: ETT inserted through vocal cords under direct vision, positive ETCO2 and breath sounds checked- equal and bilateral Secured at: 22 cm Tube secured with: Tape Dental Injury: Teeth and Oropharynx as per pre-operative assessment

## 2021-08-31 NOTE — H&P (Signed)
Providing Compassionate, Quality Care - Together  NEUROSURGERY HISTORY & PHYSICAL   Amanda Conner is an 71 y.o. female.   Chief Complaint: Mid back pain HPI: This is a 71 year old female, currently undergoing treatment for multiple myeloma, that has severe mid back pain.  Imaging revealed a T12 compression fracture with progressive collapse and kyphosis.  Given her continued pain she underwent repeat MRI and was found to have a T8 11 superior endplate fracture as well.  Unfortunately this has been altering her lifestyle and she has been unable to ambulate due to her significant pain.  She denies any numbness, tingling or weakness in her legs.  She denies any bowel or bladder changes.  Her treatment of multiple myeloma has been on hold recently due to her severe pain.  Past Medical History:  Diagnosis Date   Arthritis    osteoarthritis   Back pain    Bronchiectasis (HCC)    Cancer (HCC)    basal cell nose   Chronic cough    Complication of anesthesia    Dyspnea    Gastrointestinal symptoms    GERD (gastroesophageal reflux disease)    Glaucoma    History of hiatal hernia    Hyperlipemia    Hypertension    Multiple myeloma (HCC)    Osteopenia    Pneumonia    PONV (postoperative nausea and vomiting)    "only one time"   Spondylisthesis     Past Surgical History:  Procedure Laterality Date   ABDOMINAL EXPOSURE N/A 12/29/2018   Procedure: ABDOMINAL EXPOSURE;  Surgeon: Angelia Mould, MD;  Location: [facility];  Service: Vascular;  Laterality: N/A;   ABDOMINOPLASTY  2002   ABDOMINOPLASTY  1970's   ANKLE SURGERY Left 10/2015   ANTERIOR LATERAL LUMBAR FUSION WITH PERCUTANEOUS SCREW 3 LEVEL Left 12/29/2018   Procedure: Lumbar Two to Lumbar Five Anterolateral lumbar interbody fusion;  Surgeon: Erline Levine, MD;  Location: [facility];  Service: Neurosurgery;  Laterality: Left;  anterolateral   ANTERIOR LUMBAR FUSION N/A 12/29/2018   Procedure: Lumbar Five Sacral One Anterior  lumbar interbody fusion;  Surgeon: Erline Levine, MD;  Location: [facility];  Service: Neurosurgery;  Laterality: N/A;  anterior approach   BASAL CELL CARCINOMA EXCISION  06/2018   nose   BLEPHAROPLASTY Bilateral 1999   BREAST ENHANCEMENT SURGERY  1970's   COLONOSCOPY     CYSTOURETHROSCOPY  2018   D[place] J ureteal stents   DECOMPRESSION CORE HIP Left 2014   FACIAL COSMETIC SURGERY  2004   FLUOROSCOPY GUIDANCE     HIP ARTHROPLASTY Left    INCISIONAL HERNIA REPAIR N/A 10/08/2019   Procedure: OPEN INCISIONAL HERNIA REPAIR WITH MESH;  Surgeon: Armandina Gemma, MD;  Location: [facility];  Service: General;  Laterality: N/A;   JOINT REPLACEMENT Left    Hip; 05/2014   LAPAROSCOPIC PARTIAL COLECTOMY  06/26/2017   LUMBAR PERCUTANEOUS PEDICLE SCREW 4 LEVEL N/A 12/29/2018   Procedure: Lumbar Percutaneous Pedicle Screw Placement Lumbar two-Sacral one;  Surgeon: Erline Levine, MD;  Location: [facility];  Service: Neurosurgery;  Laterality: N/A;   MOHS SURGERY  2019   nose   PARTIAL COLECTOMY  06/2017   for diverticulitis   REMOVAL OF BILATERAL TISSUE EXPANDERS WITH PLACEMENT OF BILATERAL BREAST IMPLANTS  2004   THIGH LIFT  1994   TOE FUSION Right    great toe   TONSILLECTOMY     UMBILICAL HERNIA REPAIR     with tummy tuck   UMBILICAL HERNIA  REPAIR  2002    Family History  Problem Relation Age of Onset   Hypertension Mother    COPD Mother    Hypertension Father    Heart disease Father    Social History:  reports that she has never smoked. She has never used smokeless tobacco. She reports current alcohol use. She reports that she does not use drugs.  Allergies:  Allergies  Allergen Reactions   Tobramycin-Dexamethasone Other (See Comments)    Itching and swelling   Ethambutol Other (See Comments)    Fever   Neomycin-Polymyxin-Dexameth Itching and Swelling   Amikacin Other (See Comments)    Eosinophilia with chills and aching when given with cefoxitin   Biaxin [Clarithromycin] Itching   Cefoxitin  Other (See Comments)    Eosinophlia when given with amikacin   Sulfamethoxazole-Trimethoprim Itching   Vancomycin     "Got red splotches on skin after several doses"   Bacitracin-Polymyxin B Rash    Medications Prior to Admission  Medication Sig Dispense Refill   albuterol (VENTOLIN HFA) 108 (90 Base) MCG/ACT inhaler Inhale 2 puffs into the lungs every 6 (six) hours as needed for wheezing or shortness of breath.     azithromycin (ZITHROMAX) 250 MG tablet Take 250 mg by mouth daily.     Bedaquiline Fumarate (SIRTURO) 100 MG TABS Take 200 mg by mouth every Monday, Wednesday, and Friday.     benzonatate (TESSALON) 100 MG capsule Take 100 mg by mouth 3 (three) times daily as needed for cough.     Biotin 1000 MCG tablet Take 1,000 mcg by mouth daily.     Calcium Carb-Cholecalciferol 600-800 MG-UNIT TABS Take 1 tablet by mouth daily.     dextromethorphan-guaiFENesin (MUCINEX DM) 30-600 MG 12hr tablet Take 1 tablet by mouth daily.      estrogen, conjugated,-medroxyprogesterone (PREMPRO) 0.45-1.5 MG tablet Take 1 tablet by mouth daily.     feeding supplement (ENSURE ENLIVE / ENSURE PLUS) LIQD Take 237 mLs by mouth 2 (two) times daily between meals. (Patient taking differently: Take 237 mLs by mouth daily.) 237 mL 12   fluticasone (FLONASE) 50 MCG/ACT nasal spray Place 1 spray into both nostrils daily.     hydrochlorothiazide (HYDRODIURIL) 25 MG tablet Take 25 mg by mouth daily as needed (swelling).     Lactulose 20 GM/30ML SOLN Take 30 ml by mouth every 3 hours until bowel movement is had, then take 30 ml daily (Patient taking differently: Take 20 g by mouth daily as needed (constipation).) 946 mL 3   latanoprost (XALATAN) 0.005 % ophthalmic solution Place 1 drop into both eyes at bedtime.      linaclotide (LINZESS) 145 MCG CAPS capsule Take 145 mcg by mouth daily as needed (constipation).     lisinopril (ZESTRIL) 20 MG tablet Take 20 mg by mouth daily.     loratadine (CLARITIN) 10 MG tablet Take 10  mg by mouth daily.     methocarbamol (ROBAXIN) 500 MG tablet Take 1 tablet (500 mg total) by mouth every 6 (six) hours as needed for muscle spasms. 40 tablet 3   omeprazole (PRILOSEC) 20 MG capsule Take 20 mg by mouth daily.     ondansetron (ZOFRAN ODT) 4 MG disintegrating tablet Take 1 tablet (4 mg total) by mouth every 4 (four) hours as needed for nausea or vomiting. 20 tablet 6   Oxycodone HCl 10 MG TABS Take 1 tablet (10 mg total) by mouth every 6 (six) hours as needed. 120 tablet 0   pantoprazole (PROTONIX)  40 MG tablet Take 40 mg by mouth daily.     acyclovir (ZOVIRAX) 400 MG tablet Take 1 tablet (400 mg total) by mouth 2 (two) times daily. (Patient not taking: Reported on 08/24/2021) 60 tablet 4   aspirin EC 81 MG tablet Take 81 mg by mouth daily. Swallow whole. (Patient not taking: Reported on 08/24/2021)     calcitonin, salmon, (MIACALCIN/FORTICAL) 200 UNIT/ACT nasal spray Place 1 spray into alternate nostrils daily.     daratumumab-hyaluronidase-fihj (DARZALEX FASPRO) 1800-30000 MG-UT/15ML SOLN Inject 1,800 mg into the skin once a week. (Patient not taking: Reported on 08/24/2021)     dexamethasone (DECADRON) 2 MG tablet Take 5 tablets (10 mg total) by mouth once a week. (Patient not taking: Reported on 08/24/2021) 20 tablet 4   SUMAtriptan (IMITREX) 100 MG tablet Take 100 mg by mouth every 2 (two) hours as needed for migraine. May repeat in 2 hours if headache persists or recurs.      No results found for this or any previous visit (from the past 48 hour(s)). No results found.  ROS All pertinent positives and negatives are listed in HPI above  Blood pressure 134/82, pulse 86, temperature 97.6 F (36.4 C), temperature source Oral, resp. rate 17, height _0  (1.651 m), weight 56.7 kg, SpO2 96 %. Physical Exam  Awake alert oriented x3, mild distress due to pain PERRLA EOMI Cranial nerves II through XII intact Bilateral upper extremity, bilateral lower extremity full strength,  symmetric Sensory intact to light touch TTP lower thoracic spine bilaterally  Assessment/Plan 71 year old female with  T11, T12 pathologic compression fractures, due to multiple myeloma  -Or today for T11, T12 kyphoplasty.  We discussed all risks, benefits and expected outcomes as well as alternatives to treatment.  I answered all of her questions.  She is interested in wearing a TLSO brace postoperatively in hopes to prevent further fractures during her continued treatment.  Informed consent was obtained.    Thank you for allowing me to participate in this patient's care.  Please do not hesitate to call with questions or concerns.   Elwin Sleight, [facility] Cell: [phone]

## 2021-08-31 NOTE — Anesthesia Postprocedure Evaluation (Signed)
Anesthesia Post Note  Patient: JANASIA COVERDALE  Procedure(s) Performed: KYPHOPLASTY THORACIC ELEVEN, THORACIC TWELVE (Back)     Patient location during evaluation: PACU Anesthesia Type: General Level of consciousness: awake and alert, patient cooperative and oriented Pain management: pain level controlled Vital Signs Assessment: post-procedure vital signs reviewed and stable Respiratory status: spontaneous breathing, nonlabored ventilation and respiratory function stable Cardiovascular status: blood pressure returned to baseline and stable Postop Assessment: no apparent nausea or vomiting and able to ambulate Anesthetic complications: no   No notable events documented.  Last Vitals:  Vitals:   08/31/21 0903 08/31/21 0918  BP: 126/72 130/75  Pulse: 91 93  Resp: 11 17  Temp:  36.7 C  SpO2: 98% 93%    Last Pain:  Vitals:   08/31/21 0918  TempSrc:   PainSc: 4                  Payslie Mccaig,E. Rihaan Barrack

## 2021-08-31 NOTE — Op Note (Signed)
° °  Providing Compassionate, Quality Care - Together  Date of service: 08/31/2021   PREOP DIAGNOSIS:  T11, T12 acute pathologic  due to multiple myeloma  POSTOP DIAGNOSIS: Same  PROCEDURE: T11 unipedicular vertebroplasty, Medtronic cement T12 bipedicular vertebroplasty, Medtronic cement Intraoperative use of fluoroscopy  SURGEON: Dr. Pieter Partridge C. Ravinder Hofland, DO  ASSISTANT: None  ANESTHESIA: General Endotracheal  EBL: Minimal  SPECIMENS: None  DRAINS: None  COMPLICATIONS: None  CONDITION: Hemodynamically stable  HISTORY: Amanda Conner is a 71 y.o. female that was found to have a T11 and T12 acute compression fracture with significant loss of height at T12 due to recent diagnosis of multiple myeloma.  Her pain was quite severe and intractable to medication and altering her lifestyle where she could minimally ambulate and perform ADLs.  Therefore we discussed surgery in the form of kyphoplasty/vertebroplasty.  We discussed all risks, benefits and expected outcomes as well as alternatives to treatment.  Informed consent was obtained.  PROCEDURE IN DETAIL: The patient was brought to the operating room. After induction of general anesthesia, the patient was positioned on the operative table in the prone position. All pressure points were meticulously padded. Skin incision was then marked out and prepped and draped in the usual sterile fashion.  Physician driven timeout was performed.  AP and lateral fluoroscopy were oriented to localize the T11 and T12 vertebral bodies.  Local anesthetic was injected into the planned stab incisions.  Using a 15 blade, stab incision was created and the right pedicle of T11 was accessed with a Jamshidi needle.  Care was taken to not violate medially.  Drill was used to create space.   Using a 15 blade, a stab incision was created along the left and a Jamshidi was used to access the left T12 pedicle.  Again care was taken to not violate medially.  The drill  was used to create a tract in the vertebral body however there is significant sclerosis and collapse.  Therefore I did not use a balloon at this level.  I could only access approximately the left one third of the vertebral body therefore using a 15 blade, right-sided stab incision was created and a another Jamshidi was used to access the right T12 pedicle.  Again the drill was used to create a tract.  Using live AP and lateral fluoroscopy, cement was then placed in the T11 vertebral body with excellent fill bilaterally, approximately 4.5 cc.  There was no extravasation noted.  Jamshidi was removed from T11.  Using live AP and lateral fluoroscopy, cement was then used to fill the T12 vertebral body, minimal cement was achieved on the right as then there was some slight extravasation into the T12-L1 disc.  Approximately one third of the vertebral body was filled from the left with 1 stick of cement.  No extravasation noted.  Jamshidi's were removed without difficulty.  Final AP and lateral fluoroscopy confirmed appropriate placement.  The stab incisions were then hemostased with pressure, and benzoin and Steri-Strips were placed over the stab incisions.  Sterile dressing applied.  At the end of the case all sponge, needle, and instrument counts were correct. The patient was then transferred to the stretcher, extubated, and taken to the post-anesthesia care unit in stable hemodynamic condition.

## 2021-08-31 NOTE — Transfer of Care (Signed)
Immediate Anesthesia Transfer of Care Note  Patient: Amanda Conner  Procedure(s) Performed: KYPHOPLASTY THORACIC ELEVEN, THORACIC TWELVE (Back)  Patient Location: PACU  Anesthesia Type:General  Level of Consciousness: drowsy and patient cooperative  Airway & Oxygen Therapy: Patient Spontanous Breathing  Post-op Assessment: Report given to RN and Post -op Vital signs reviewed and stable  Post vital signs: Reviewed and stable  Last Vitals:  Vitals Value Taken Time  BP 126/63 08/31/21 0847  Temp    Pulse 91 08/31/21 0847  Resp 17 08/31/21 0847  SpO2 97 % 08/31/21 0847  Vitals shown include unvalidated device data.  Last Pain:  Vitals:   08/31/21 0648  TempSrc:   PainSc: 3       Patients Stated Pain Goal: 2 (05/17/14 9458)  Complications: No notable events documented.

## 2021-08-31 NOTE — Progress Notes (Signed)
Orthopedic Tech Progress Note Patient Details:  Amanda Conner [birth year] [id number]  Ortho Devices Type of Ortho Device: Other (comment) Ortho Device/Splint Location: TLSO/Back Ortho Device/Splint Interventions: Ordered  Dropped brace off to nurse.    Michaell Grider L [name] 08/31/2021, 9:25 AM

## 2021-09-03 ENCOUNTER — Other Ambulatory Visit (HOSPITAL_COMMUNITY): Payer: Self-pay

## 2021-09-03 ENCOUNTER — Encounter (HOSPITAL_COMMUNITY): Payer: Self-pay | Admitting: Neurological Surgery

## 2021-09-03 DIAGNOSIS — C9000 Multiple myeloma not having achieved remission: Secondary | ICD-10-CM

## 2021-09-03 MED ORDER — LENALIDOMIDE 20 MG PO CAPS: 20.0000 mg | ORAL_CAPSULE | Freq: Every day | ORAL | 0 refills | Status: DC

## 2021-09-03 NOTE — Telephone Encounter (Signed)
Chart reviewed. Revlimid refilled per last office note with Dr. Katragadda.  

## 2021-09-13 ENCOUNTER — Other Ambulatory Visit: Payer: Self-pay

## 2021-09-13 ENCOUNTER — Inpatient Hospital Stay (HOSPITAL_COMMUNITY): Payer: Medicare Other

## 2021-09-13 ENCOUNTER — Inpatient Hospital Stay (HOSPITAL_COMMUNITY): Payer: Medicare Other | Attending: Hematology

## 2021-09-13 ENCOUNTER — Inpatient Hospital Stay (HOSPITAL_COMMUNITY): Payer: Medicare Other | Admitting: Dietician

## 2021-09-13 ENCOUNTER — Inpatient Hospital Stay (HOSPITAL_BASED_OUTPATIENT_CLINIC_OR_DEPARTMENT_OTHER): Payer: Medicare Other | Admitting: Hematology

## 2021-09-13 VITALS — BP 154/87 | HR 73 | Temp 98.0°F | Resp 18

## 2021-09-13 VITALS — BP 150/88 | HR 74 | Temp 98.3°F | Resp 18 | Ht 65.0 in | Wt 129.0 lb

## 2021-09-13 DIAGNOSIS — R0602 Shortness of breath: Secondary | ICD-10-CM | POA: Insufficient documentation

## 2021-09-13 DIAGNOSIS — M899 Disorder of bone, unspecified: Secondary | ICD-10-CM

## 2021-09-13 DIAGNOSIS — C9000 Multiple myeloma not having achieved remission: Secondary | ICD-10-CM | POA: Diagnosis present

## 2021-09-13 DIAGNOSIS — Z8651 Personal history of combat and operational stress reaction: Secondary | ICD-10-CM | POA: Diagnosis not present

## 2021-09-13 DIAGNOSIS — Z800 Family history of malignant neoplasm of digestive organs: Secondary | ICD-10-CM | POA: Insufficient documentation

## 2021-09-13 DIAGNOSIS — K5900 Constipation, unspecified: Secondary | ICD-10-CM | POA: Insufficient documentation

## 2021-09-13 DIAGNOSIS — Z5112 Encounter for antineoplastic immunotherapy: Secondary | ICD-10-CM | POA: Diagnosis present

## 2021-09-13 DIAGNOSIS — Z840 Family history of diseases of the skin and subcutaneous tissue: Secondary | ICD-10-CM | POA: Diagnosis not present

## 2021-09-13 DIAGNOSIS — Z85828 Personal history of other malignant neoplasm of skin: Secondary | ICD-10-CM | POA: Diagnosis not present

## 2021-09-13 DIAGNOSIS — M545 Low back pain, unspecified: Secondary | ICD-10-CM | POA: Diagnosis not present

## 2021-09-13 DIAGNOSIS — R059 Cough, unspecified: Secondary | ICD-10-CM | POA: Diagnosis not present

## 2021-09-13 DIAGNOSIS — R5383 Other fatigue: Secondary | ICD-10-CM | POA: Diagnosis not present

## 2021-09-13 DIAGNOSIS — R110 Nausea: Secondary | ICD-10-CM | POA: Diagnosis not present

## 2021-09-13 LAB — CBC WITH DIFFERENTIAL/PLATELET
Abs Immature Granulocytes: 0.01 10*3/uL (ref 0.00–0.07)
Basophils Absolute: 0.1 10*3/uL (ref 0.0–0.1)
Basophils Relative: 1 %
Eosinophils Absolute: 0.3 10*3/uL (ref 0.0–0.5)
Eosinophils Relative: 4 %
HCT: 38.1 % (ref 36.0–46.0)
Hemoglobin: 12 g/dL (ref 12.0–15.0)
Immature Granulocytes: 0 %
Lymphocytes Relative: 36 %
Lymphs Abs: 2.4 10*3/uL (ref 0.7–4.0)
MCH: 28.3 pg (ref 26.0–34.0)
MCHC: 31.5 g/dL (ref 30.0–36.0)
MCV: 89.9 fL (ref 80.0–100.0)
Monocytes Absolute: 0.6 10*3/uL (ref 0.1–1.0)
Monocytes Relative: 8 %
Neutro Abs: 3.5 10*3/uL (ref 1.7–7.7)
Neutrophils Relative %: 51 %
Platelets: 365 10*3/uL (ref 150–400)
RBC: 4.24 MIL/uL (ref 3.87–5.11)
RDW: 15.2 % (ref 11.5–15.5)
WBC: 6.9 10*3/uL (ref 4.0–10.5)
nRBC: 0 % (ref 0.0–0.2)

## 2021-09-13 LAB — COMPREHENSIVE METABOLIC PANEL
ALT: 19 U/L (ref 0–44)
AST: 24 U/L (ref 15–41)
Albumin: 4.6 g/dL (ref 3.5–5.0)
Alkaline Phosphatase: 78 U/L (ref 38–126)
Anion gap: 10 (ref 5–15)
BUN: 10 mg/dL (ref 8–23)
CO2: 27 mmol/L (ref 22–32)
Calcium: 9.4 mg/dL (ref 8.9–10.3)
Chloride: 100 mmol/L (ref 98–111)
Creatinine, Ser: 0.58 mg/dL (ref 0.44–1.00)
GFR, Estimated: >60 mL/min (ref >60)
Glucose, Bld: 95 mg/dL (ref 70–99)
Potassium: 4.5 mmol/L (ref 3.5–5.1)
Sodium: 137 mmol/L (ref 135–145)
Total Bilirubin: 0.6 mg/dL (ref 0.3–1.2)
Total Protein: 7.7 g/dL (ref 6.5–8.1)

## 2021-09-13 LAB — MAGNESIUM: Magnesium: 2.1 mg/dL (ref 1.7–2.4)

## 2021-09-13 LAB — LACTATE DEHYDROGENASE: LDH: 156 U/L (ref 98–192)

## 2021-09-13 MED ORDER — DEXAMETHASONE 4 MG PO TABS
20.0000 mg | ORAL_TABLET | Freq: Once | ORAL | Status: AC
  Administered 2021-09-13: 20 mg via ORAL
  Filled 2021-09-13: qty 5

## 2021-09-13 MED ORDER — DARATUMUMAB-HYALURONIDASE-FIHJ 1800-30000 MG-UT/15ML ~~LOC~~ SOLN
1800.0000 mg | Freq: Once | SUBCUTANEOUS | Status: AC
  Administered 2021-09-13: 1800 mg via SUBCUTANEOUS
  Filled 2021-09-13: qty 15

## 2021-09-13 MED ORDER — ACETAMINOPHEN 325 MG PO TABS
650.0000 mg | ORAL_TABLET | Freq: Once | ORAL | Status: AC
  Administered 2021-09-13: 650 mg via ORAL
  Filled 2021-09-13: qty 2

## 2021-09-13 MED ORDER — DIPHENHYDRAMINE HCL 25 MG PO CAPS
50.0000 mg | ORAL_CAPSULE | Freq: Once | ORAL | Status: AC
  Administered 2021-09-13: 50 mg via ORAL
  Filled 2021-09-13: qty 2

## 2021-09-13 MED ORDER — ZOLEDRONIC ACID 4 MG/100ML IV SOLN
4.0000 mg | Freq: Once | INTRAVENOUS | Status: AC
  Administered 2021-09-13: 4 mg via INTRAVENOUS
  Filled 2021-09-13: qty 100

## 2021-09-13 MED ORDER — SODIUM CHLORIDE 0.9 % IV SOLN: Freq: Once | INTRAVENOUS | Status: AC

## 2021-09-13 MED ORDER — BORTEZOMIB CHEMO SQ INJECTION 3.5 MG (2.5MG/ML)
1.0000 mg/m2 | Freq: Once | INTRAMUSCULAR | Status: AC
  Administered 2021-09-13: 1.75 mg via SUBCUTANEOUS
  Filled 2021-09-13: qty 0.7

## 2021-09-13 NOTE — Progress Notes (Signed)
Nutrition Assessment ? ? ?Reason for Assessment: MST ? ? ?ASSESSMENT: 71 year old female with multiple myeloma. She is currently receiving induction therapy with DaraVRd q21d x 6 cycles. ? ?Past medical history includes osteoarthritis, bronchiectasis, GERD, HLD, HTN, osteopenia, spondylisthesis. ? ?Patient is s/p vertebroplasy for superior endplate fracture of F07, wedge fracture of T12 on 08/31/21. ? ?Met with patient and husband in clinic. Patient reports appetite is improving now that she has been off of chemotherapy. Patient reports nausea has resolved. She is taking compazine as needed. This has been working well for her. Patient reports altered taste with sweet foods. She does like chocolate Ensure as well as dark chocolate. Patient has been eating small portions several times daily.  ? ?Nutrition Focused Physical Exam: deferred ? ? ?Medications: acyclovir, biotin, calcium, darzalex faspro, decadron, mucinex DM, prempro, hydroodiuril, linzess, zestril, prilosec, zofran, imitrex ? ? ?Labs: reviewed ? ? ?Anthropometrics: Weight has increased 4 lbs in 2 weeks ? ?Height: 5'5" ?Weight: 129 lb ?UBW: 130-135 lb (March 20211-Feb 2023) ?BMI: 21.47 ? ? ?NUTRITION DIAGNOSIS: Unintentional weight loss related to newly diagnosed multiple myeloma and associated treatment side effects as evidenced by reported decreased appetite, altered taste, nausea, 4% decrease from usual weight in 2 months; insignificant for time frame ? ? ?INTERVENTION:  ?Educated on small frequent meals and snacks with adequate calories and protein - handout with ideas provided ?Discussed ways to add calories/protein to foods - handout and smoothie recipes provided  ?Continue drinking Ensure, encouraged switching to Ensure Plus/equivalent, recommend 2x/day - coupons provided ?Discussed strategies for nausea, foods best tolerated and foods to limit/avoid - handout with tips provided  ?Discussed strategies for altered taste - handout with tips  provided ?Contact information given  ? ? ?MONITORING, EVALUATION, GOAL: Patient will tolerate increased calories and protein to minimize weight loss  ? ? ?Next Visit: Thursday March 30 during infusion  ? ? ? ? ? ? ?

## 2021-09-13 NOTE — Progress Notes (Signed)
Patient presents today for Velcade, Daratumumab, and Zometa per providers order.  Vital signs and labs within parameters for treatment.  Patient has no new complaints at this time. ? ?Peripheral IV started and blood return noted pre and post infusion. ? ?Stable during administration without incident; injection sites WNL; see MAR for injection details.  Patient tolerated procedure well and without incident.  No questions or complaints noted at this time.  Stable during infusion without adverse affects.  Vital signs stable.  Discharge from clinic ambulatory in stable condition.  Alert and oriented X 3.  Follow up with [facility] as scheduled.  ?

## 2021-09-13 NOTE — Patient Instructions (Addendum)
[place] at [facility] ?Discharge Instructions ? ? ?You were seen and examined today by Dr. Delton Coombes. ? ?He reviewed the results of your lab work which is normal/stable.  ? ?We will switch your anti-viral medication to Valtrex 500 mg twice a day. ? ?Continue to hold Revlimid for now.  ? ?We will resume treatment with Darzalex and Velcade injections.  ? ?You will receive Zometa infusion today.  ? ?Return as scheduled.  ? ? ?Thank you for choosing [facility] at [facility] to provide your oncology and hematology care.  To afford each patient quality time with our provider, please arrive at least 15 minutes before your scheduled appointment time.  ? ?If you have a lab appointment with the [facility] please come in thru the Main Entrance and check in at the main information desk. ? ?You need to re-schedule your appointment should you arrive 10 or more minutes late.  We strive to give you quality time with our providers, and arriving late affects you and other patients whose appointments are after yours.  Also, if you no show three or more times for appointments you may be dismissed from the clinic at the providers discretion.     ?Again, thank you for choosing [facility].  Our hope is that these requests will decrease the amount of time that you wait before being seen by our physicians.       ?_____________________________________________________________ ? ?Should you have questions after your visit to [facility], please contact our office at [phone] and follow the prompts.  Our office hours are 8:00 a.m. and 4:30 p.m. Monday - Friday.  Please note that voicemails left after 4:00 p.m. may not be returned until the following business day.  We are closed weekends and major holidays.  You do have access to a nurse 24-7, just call the main number to the clinic [phone] and do not press any options, hold on the line and a nurse will  answer the phone.   ? ?For prescription refill requests, have your pharmacy contact our office and allow 72 hours.   ? ?Due to Covid, you will need to wear a mask upon entering the hospital. If you do not have a mask, a mask will be given to you at the Main Entrance upon arrival. For doctor visits, patients may have 1 support person age 57 or older with them. For treatment visits, patients can not have anyone with them due to social distancing guidelines and our immunocompromised population.  ? ?   ?

## 2021-09-13 NOTE — Patient Instructions (Signed)
[facility]  Discharge Instructions: ?Thank you for choosing [facility] to provide your oncology and hematology care.  ?If you have a lab appointment with the [facility], please come in thru the Main Entrance and check in at the main information desk. ? ?Wear comfortable clothing and clothing appropriate for easy access to any Portacath or PICC line.  ? ?We strive to give you quality time with your provider. You may need to reschedule your appointment if you arrive late (15 or more minutes).  Arriving late affects you and other patients whose appointments are after yours.  Also, if you miss three or more appointments without notifying the office, you may be dismissed from the clinic at the provider?s discretion.    ?  ?For prescription refill requests, have your pharmacy contact our office and allow 72 hours for refills to be completed.   ? ?Today you received the following chemotherapy and/or immunotherapy agents Velcade, Daratumumab, zometa    ?  ?To help prevent nausea and vomiting after your treatment, we encourage you to take your nausea medication as directed. ? ?BELOW ARE SYMPTOMS THAT SHOULD BE REPORTED IMMEDIATELY: ?*FEVER GREATER THAN 100.4 F (38 ?C) OR HIGHER ?*CHILLS OR SWEATING ?*NAUSEA AND VOMITING THAT IS NOT CONTROLLED WITH YOUR NAUSEA MEDICATION ?*UNUSUAL SHORTNESS OF BREATH ?*UNUSUAL BRUISING OR BLEEDING ?*URINARY PROBLEMS (pain or burning when urinating, or frequent urination) ?*BOWEL PROBLEMS (unusual diarrhea, constipation, pain near the anus) ?TENDERNESS IN MOUTH AND THROAT WITH OR WITHOUT PRESENCE OF ULCERS (sore throat, sores in mouth, or a toothache) ?UNUSUAL RASH, SWELLING OR PAIN  ?UNUSUAL VAGINAL DISCHARGE OR ITCHING  ? ?Items with * indicate a potential emergency and should be followed up as soon as possible or go to the Emergency Department if any problems should occur. ? ?Please show the CHEMOTHERAPY ALERT CARD or IMMUNOTHERAPY ALERT CARD at check-in to  the Emergency Department and triage nurse. ? ?Should you have questions after your visit or need to cancel or reschedule your appointment, please contact [facility] [phone]  and follow the prompts.  Office hours are 8:00 a.m. to 4:30 p.m. Monday - Friday. Please note that voicemails left after 4:00 p.m. may not be returned until the following business day.  We are closed weekends and major holidays. You have access to a nurse at all times for urgent questions. Please call the main number to the clinic [phone] and follow the prompts. ? ?For any non-urgent questions, you may also contact your provider using MyChart. We now offer e-Visits for anyone 69 and older to request care online for non-urgent symptoms. For details visit [email or web address]. ?  ?Also download the MyChart app! Go to the app store, search "MyChart", open the app, select [facility], and log in with your MyChart username and password. ? ?Due to Covid, a mask is required upon entering the hospital/clinic. If you do not have a mask, one will be given to you upon arrival. For doctor visits, patients may have 1 support person aged 49 or older with them. For treatment visits, patients cannot have anyone with them due to current Covid guidelines and our immunocompromised population.  ?

## 2021-09-13 NOTE — Progress Notes (Signed)
Amanda Conner, [place] [id number]   CLINIC:  Medical Oncology/Hematology  PCP:  Yvone Neu, MD [facility] and [place] [address] /* [phone]   REASON FOR VISIT:  Follow-up for multiple myeloma  PRIOR THERAPY: none  NGS Results: not done  CURRENT THERAPY: DaraVRd (Daratumumab SQ) q21d x 6 Cycles (Induction/Consolidation)  BRIEF ONCOLOGIC HISTORY:  Oncology History  Multiple myeloma ([place])  07/17/2021 Initial Diagnosis   Multiple myeloma ([place])   07/26/2021 -  Chemotherapy   Patient is on Treatment Plan : MYELOMA NEWLY DIAGNOSED TRANSPLANT CANDIDATE DaraVRd (Daratumumab SQ) q21d x 6 Cycles (Induction/Consolidation)       CANCER STAGING:  Cancer Staging  Multiple myeloma ([place]) Staging form: Multiple Myeloma, AJCC 6th Edition - Clinical stage from 07/17/2021: Stage IA - Unsigned   INTERVAL HISTORY:  Amanda Conner, a 71 y.o. female, returns for routine follow-up and consideration for next cycle of chemotherapy. Shivon was last seen on 08/16/2021.  Due for cycle #2 of DaraVRd today.   Overall, she tells me she has been feeling pretty well. She reports lower back pain. She has lost 5 lbs since her last visit. She reports her appetite decreased while taking acyclovir, but her appetite is slowly improving. Her cough is stable. She has not started physical therapy yet.   Overall, she feels ready for next cycle of chemo today.   REVIEW OF SYSTEMS:  Review of Systems  Constitutional:  Positive for unexpected weight change (-5 lbs). Negative for appetite change and fatigue.  Respiratory:  Positive for cough (stable) and shortness of breath.   Musculoskeletal:  Positive for back pain (7/10 lower).  Psychiatric/Behavioral:  Positive for sleep disturbance. The patient is nervous/anxious.   All other systems reviewed and are negative.  PAST MEDICAL/SURGICAL HISTORY:  Past Medical History:   Diagnosis Date   Arthritis    osteoarthritis   Back pain    Bronchiectasis (HCC)    Cancer (HCC)    basal cell nose   Chronic cough    Complication of anesthesia    Dyspnea    Gastrointestinal symptoms    GERD (gastroesophageal reflux disease)    Glaucoma    History of hiatal hernia    Hyperlipemia    Hypertension    Multiple myeloma (HCC)    Osteopenia    Pneumonia    PONV (postoperative nausea and vomiting)    "only one time"   Spondylisthesis    Past Surgical History:  Procedure Laterality Date   ABDOMINAL EXPOSURE N/A 12/29/2018   Procedure: ABDOMINAL EXPOSURE;  Surgeon: Angelia Mould, MD;  Location: [facility];  Service: Vascular;  Laterality: N/A;   ABDOMINOPLASTY  2002   ABDOMINOPLASTY  1970's   ANKLE SURGERY Left 10/2015   ANTERIOR LATERAL LUMBAR FUSION WITH PERCUTANEOUS SCREW 3 LEVEL Left 12/29/2018   Procedure: Lumbar Two to Lumbar Five Anterolateral lumbar interbody fusion;  Surgeon: Erline Levine, MD;  Location: [facility];  Service: Neurosurgery;  Laterality: Left;  anterolateral   ANTERIOR LUMBAR FUSION N/A 12/29/2018   Procedure: Lumbar Five Sacral One Anterior lumbar interbody fusion;  Surgeon: Erline Levine, MD;  Location: [facility];  Service: Neurosurgery;  Laterality: N/A;  anterior approach   BASAL CELL CARCINOMA EXCISION  06/2018   nose   BLEPHAROPLASTY Bilateral 1999   BREAST ENHANCEMENT SURGERY  1970's   COLONOSCOPY     CYSTOURETHROSCOPY  2018   [place] J ureteal stents   DECOMPRESSION CORE  HIP Left 2014   FACIAL COSMETIC SURGERY  2004   FLUOROSCOPY GUIDANCE     HIP ARTHROPLASTY Left    INCISIONAL HERNIA REPAIR N/A 10/08/2019   Procedure: OPEN INCISIONAL HERNIA REPAIR WITH MESH;  Surgeon: Armandina Gemma, MD;  Location: [facility];  Service: General;  Laterality: N/A;   JOINT REPLACEMENT Left    Hip; 05/2014   KYPHOPLASTY N/A 08/31/2021   Procedure: KYPHOPLASTY THORACIC ELEVEN, THORACIC TWELVE;  Surgeon: Karsten Ro, DO;  Location: [facility];  Service:  Neurosurgery;  Laterality: N/A;   LAPAROSCOPIC PARTIAL COLECTOMY  06/26/2017   LUMBAR PERCUTANEOUS PEDICLE SCREW 4 LEVEL N/A 12/29/2018   Procedure: Lumbar Percutaneous Pedicle Screw Placement Lumbar two-Sacral one;  Surgeon: Erline Levine, MD;  Location: [facility];  Service: Neurosurgery;  Laterality: N/A;   MOHS SURGERY  2019   nose   PARTIAL COLECTOMY  06/2017   for diverticulitis   REMOVAL OF BILATERAL TISSUE EXPANDERS WITH PLACEMENT OF BILATERAL BREAST IMPLANTS  2004   THIGH LIFT  1994   TOE FUSION Right    great toe   TONSILLECTOMY     UMBILICAL HERNIA REPAIR     with tummy tuck   UMBILICAL HERNIA REPAIR  2002    SOCIAL HISTORY:  Social History   Socioeconomic History   Marital status: Married    Spouse name: Not on file   Number of children: Not on file   Years of education: Not on file   Highest education level: Not on file  Occupational History   Not on file  Tobacco Use   Smoking status: Never   Smokeless tobacco: Never  Vaping Use   Vaping Use: Never used  Substance and Sexual Activity   Alcohol use: Yes    Comment: occasional   Drug use: Never   Sexual activity: Not on file  Other Topics Concern   Not on file  Social History Narrative   Not on file   Social Determinants of Health   Financial Resource Strain: Low Risk    Difficulty of Paying Living Expenses: Not hard at all  Food Insecurity: No Food Insecurity   Worried About Charity fundraiser in the Last Year: Never true   [place] in the Last Year: Never true  Transportation Needs: No Transportation Needs   Lack of Transportation (Medical): No   Lack of Transportation (Non-Medical): No  Physical Activity: Not on file  Stress: Not on file  Social Connections: Socially Integrated   Frequency of Communication with Friends and Family: More than three times a week   Frequency of Social Gatherings with Friends and Family: More than three times a week   Attends Religious Services: 1 to 4 times per  year   Active Member of [employer] or Organizations: No   Attends Music therapist: 1 to 4 times per year   Marital Status: Married  Human resources officer Violence: Not on file    FAMILY HISTORY:  Family History  Problem Relation Age of Onset   Hypertension Mother    COPD Mother    Hypertension Father    Heart disease Father     CURRENT MEDICATIONS:  Current Outpatient Medications  Medication Sig Dispense Refill   acyclovir (ZOVIRAX) 400 MG tablet Take 1 tablet (400 mg total) by mouth 2 (two) times daily. 60 tablet 4   albuterol (VENTOLIN HFA) 108 (90 Base) MCG/ACT inhaler Inhale 2 puffs into the lungs every 6 (six) hours as needed for wheezing or  shortness of breath.     aspirin EC 81 MG tablet Take 81 mg by mouth daily. Swallow whole.     azithromycin (ZITHROMAX) 250 MG tablet Take 250 mg by mouth daily.     Bedaquiline Fumarate (SIRTURO) 100 MG TABS Take 200 mg by mouth every Monday, Wednesday, and Friday.     benzonatate (TESSALON) 100 MG capsule Take 100 mg by mouth 3 (three) times daily as needed for cough.     Biotin 1000 MCG tablet Take 1,000 mcg by mouth daily.     calcitonin, salmon, (MIACALCIN/FORTICAL) 200 UNIT/ACT nasal spray Place 1 spray into alternate nostrils daily.     Calcium Carb-Cholecalciferol 600-800 MG-UNIT TABS Take 1 tablet by mouth daily.     daratumumab-hyaluronidase-fihj (DARZALEX FASPRO) 1800-30000 MG-UT/15ML SOLN Inject 1,800 mg into the skin once a week.     dexamethasone (DECADRON) 2 MG tablet Take 5 tablets (10 mg total) by mouth once a week. 20 tablet 4   dextromethorphan-guaiFENesin (MUCINEX DM) 30-600 MG 12hr tablet Take 1 tablet by mouth daily.      estrogen, conjugated,-medroxyprogesterone (PREMPRO) 0.45-1.5 MG tablet Take 1 tablet by mouth daily.     feeding supplement (ENSURE ENLIVE / ENSURE PLUS) LIQD Take 237 mLs by mouth 2 (two) times daily between meals. (Patient taking differently: Take 237 mLs by mouth daily.) 237 mL 12   fluconazole  (DIFLUCAN) 150 MG tablet Take by mouth.     fluticasone (FLONASE) 50 MCG/ACT nasal spray Place 1 spray into both nostrils daily.     hydrochlorothiazide (HYDRODIURIL) 25 MG tablet Take 25 mg by mouth daily as needed (swelling).     Lactulose 20 GM/30ML SOLN Take 30 ml by mouth every 3 hours until bowel movement is had, then take 30 ml daily (Patient taking differently: Take 20 g by mouth daily as needed (constipation).) 946 mL 3   latanoprost (XALATAN) 0.005 % ophthalmic solution Place 1 drop into both eyes at bedtime.      lenalidomide (REVLIMID) 20 MG capsule Take 1 capsule (20 mg total) by mouth daily. Take for 14 days, then hold for 7 days. Repeat every 21 days. 14 capsule 0   linaclotide (LINZESS) 145 MCG CAPS capsule Take 145 mcg by mouth daily as needed (constipation).     lisinopril (ZESTRIL) 20 MG tablet Take 20 mg by mouth daily.     loratadine (CLARITIN) 10 MG tablet Take 10 mg by mouth daily.     methocarbamol (ROBAXIN) 500 MG tablet Take 1 tablet (500 mg total) by mouth every 6 (six) hours as needed for muscle spasms. 40 tablet 3   omeprazole (PRILOSEC) 20 MG capsule Take 20 mg by mouth daily.     ondansetron (ZOFRAN ODT) 4 MG disintegrating tablet Take 1 tablet (4 mg total) by mouth every 4 (four) hours as needed for nausea or vomiting. 20 tablet 6   Oxycodone HCl 10 MG TABS Take 1 tablet (10 mg total) by mouth every 6 (six) hours as needed. 120 tablet 0   pantoprazole (PROTONIX) 40 MG tablet Take 40 mg by mouth daily.     SUMAtriptan (IMITREX) 100 MG tablet Take 100 mg by mouth every 2 (two) hours as needed for migraine. May repeat in 2 hours if headache persists or recurs.     No current facility-administered medications for this visit.    ALLERGIES:  Allergies  Allergen Reactions   Tobramycin-Dexamethasone Other (See Comments)    Itching and swelling   Ethambutol Other (See Comments)  Fever   Neomycin-Polymyxin-Dexameth Itching and Swelling   Amikacin Other (See  Comments)    Eosinophilia with chills and aching when given with cefoxitin   Biaxin [Clarithromycin] Itching   Cefoxitin Other (See Comments)    Eosinophlia when given with amikacin   Sulfamethoxazole-Trimethoprim Itching   Vancomycin     "Got red splotches on skin after several doses"   Bacitracin-Polymyxin B Rash    PHYSICAL EXAM:  Performance status (ECOG): 1 - Symptomatic but completely ambulatory  Vitals:   09/13/21 1322  BP: (!) 150/88  Pulse: 74  Resp: 18  Temp: 98.3 F (36.8 C)  SpO2: 92%   Wt Readings from Last 3 Encounters:  09/13/21 129 lb (58.5 kg)  08/31/21 125 lb (56.7 kg)  08/28/21 126 lb 3.2 oz (57.2 kg)   Physical Exam Vitals reviewed.  Constitutional:      Appearance: Normal appearance.     Comments: In wheelchair  Cardiovascular:     Rate and Rhythm: Normal rate and regular rhythm.     Pulses: Normal pulses.     Heart sounds: Normal heart sounds.  Pulmonary:     Effort: Pulmonary effort is normal.     Breath sounds: Normal breath sounds.  Neurological:     General: No focal deficit present.     Mental Status: She is alert and oriented to person, place, and time.  Psychiatric:        Mood and Affect: Mood normal.        Behavior: Behavior normal.    LABORATORY DATA:  I have reviewed the labs as listed.  CBC Latest Ref Rng & Units 09/13/2021 08/16/2021 08/09/2021  WBC 4.0 - 10.5 K/uL 6.9 5.1 3.4(L)  Hemoglobin 12.0 - 15.0 g/dL 12.0 11.9(L) 10.0(L)  Hematocrit 36.0 - 46.0 % 38.1 37.0 31.1(L)  Platelets 150 - 400 K/uL 365 406(H) 304   CMP Latest Ref Rng & Units 09/13/2021 08/16/2021 08/09/2021  Glucose 70 - 99 mg/dL 95 113(H) 94  BUN 8 - 23 mg/dL 10 8 5(L)  Creatinine 0.44 - 1.00 mg/dL 0.58 0.71 0.58  Sodium 135 - 145 mmol/L 137 132(L) 133(L)  Potassium 3.5 - 5.1 mmol/L 4.5 3.5 3.2(L)  Chloride 98 - 111 mmol/L 100 97(L) 98  CO2 22 - 32 mmol/L _0 Calcium 8.9 - 10.3 mg/dL 9.4 9.1 8.0(L)  Total Protein 6.5 - 8.1 g/dL 7.7 7.4 -  Total  Bilirubin 0.3 - 1.2 mg/dL 0.6 0.4 -  Alkaline Phos 38 - 126 U/L 78 101 -  AST 15 - 41 U/L 24 26 -  ALT 0 - 44 U/L 19 18 -    DIAGNOSTIC IMAGING:  I have independently reviewed the scans and discussed with the patient. DG THORACOLUMABAR SPINE  Result Date: 08/31/2021 CLINICAL DATA:  T11 and T12 kyphoplasty. EXAM: THORACOLUMBAR SPINE 1V Radiation exposure index: 12.86 mGy. COMPARISON:  August 16, 2021. FINDINGS: Two intraoperative fluoroscopic images were obtained of the thoracolumbar spine. These demonstrate the patient be status post kyphoplasty of the T11 and T12 vertebral bodies. IMPRESSION: Fluoroscopic guidance provided during T11 and T12 kyphoplasty. Electronically Signed   By: Marijo Conception M.D.   On: 08/31/2021 08:58   MR THORACIC SPINE WO CONTRAST  Result Date: 08/17/2021 CLINICAL DATA:  Severe mid back pain.  History of prior fusion. EXAM: MRI THORACIC SPINE WITHOUT CONTRAST TECHNIQUE: Multiplanar, multisequence MR imaging of the thoracic spine was performed. No intravenous contrast was administered. COMPARISON:  08/06/2021 chest CT FINDINGS: Alignment: Exaggerated  thoracic kyphosis. Slight anterolisthesis at T1-2 to T4-5. Vertebrae: Compression fracture of T12 with hypointense horizontal fracture plane and mild marrow edema. Central height loss is advanced. Mild posterosuperior corner retropulsion. T11 compression fracture with mild superior endplate depression and greater marrow edema. Mild associated paravertebral edema. Mild height loss is unchanged from before with no retropulsion. No evidence of bone lesion at these levels. Lumbar fusion at at L2-3 and L3-4 at least. STIR hyperintense bone lesions at the left articular process of T5 and in the left posterior seventh rib, the latter known from prior chest CT in this patient with history of multiple myeloma. Cord:  Normal Paraspinal and other soft tissues: Negative for mass or acute inflammation. Root sleeve cyst on the right at T10-11  and left at T7-8. atrophy of intrinsic back muscles. Disc levels: Diffusely preserved disc height and hydration for age. No focal or advanced facet spurring. Thoracic cord or foraminal impingement. IMPRESSION: 1. Recent T11 compression fracture with mild height loss. No evidence of underlying lesion. 2. More remote but still un healed T12 compression fracture with advanced height loss and mild, noncompressive retropulsion. 3. Small bone lesion in the T5 left articular process. Known left posterior seventh rib lesion. These likely relate to history of multiple myeloma. Electronically Signed   By: Jorje Guild M.D.   On: 08/17/2021 09:25   DG C-Arm 1-60 Min-No Report  Result Date: 08/31/2021 Fluoroscopy was utilized by the requesting physician.  No radiographic interpretation.     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 Wilburn Mylar - She reported discomfort in the left posterior back for the last 1 month.  She was seen by Dr. Chauncey Reading and x-rays showed abnormality.  A CT scan of the chest was done on 05/24/2021. - CT chest report reviewed by me from 05/24/2021 showed expansile oval-shaped lesion within the posterior aspect of the seventh rib with a nodule measuring 2.1 x 1.1 cm.  Adjacent cortex demonstrates area of thinning and destruction.  Stable emphysematous and interstitial changes within the lungs.  Stable pulmonary nodules within the right upper lobe and left upper lobe when compared to CT from September 2020. - CT of the abdomen and pelvis did not show any significant abnormalities. - She denies any fevers, night sweats or weight loss in the last 6 months.  She had history of basal cell carcinoma on the nose removed by Mohs surgery. - She also reported history of osteoporosis and broke left upper rib 1 year ago after sneezing. -Reviewed PET scan from 06/14/2021.  Mild hypermetabolism involving left aspect of the L5 vertebral body  and also pedicle region surrounding the screw.  SUV 4.2.  There appears to be a lytic process on the CT scan.  The lytic left posterior seventh rib lesion is mildly hypermetabolic with SUV 3.81.  No other definite lytic hypermetabolic bone lesions seen. - We have reviewed left seventh rib bone biopsy from 06/25/2021 consistent with plasma cell neoplasm.  Cells are positive for CD138, CD56 and a kappa restricted.  - Labs on 06/13/2021 M spike 1.7 g.  Immunofixation IgA kappa.  Free light chain shows kappa light chain 71, ratio 8.22.  LDH and beta-2 microglobulin was normal. - Bone marrow biopsy on 07/04/2021 consistent with plasma cell neoplasm with 26% plasma cells on aspirate with atypical features and 50 to 60% on core biopsy.  Chromosome analysis was normal.  Multiple myeloma FISH panel shows loss  of NF1/chromosome 17/17 q.  Gain of CCN D1 or chromosome 11/11 q.  Gain of chromosome 4/4P.  Loss of material from the long-arm of chromosome 13.  These are all standard risk features. - 24-hour urine total protein to 40 mg.  Urine immunofixation was negative.  Urine kappa light chains were 4.13. - Dara RVD started on 07/26/2021. - T11 and T12 vertebroplasty by Dr. Reatha Armour on 08/31/2021.   Social/family history: - She is married and lives at home with her husband.  She has not worked but had help her husband and his work.  She is a non-smoker. - Maternal grandmother had colon cancer.  Mother had a squamous cell carcinoma of the skin.   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 Reatha Armour on 08/31/2021. - She had some improvement in pain.  She now reports mostly lower back pain. - We have reviewed her labs today which are normal.  We have sent baseline SPEP and free light chains which are pending. - Recommend restarting treatment for myeloma.  We will restart today with Darzalex and dose reduced Velcade at 1 mg/m2.  She will take dexamethasone 20 mg  on treatment days. - We will hold Revlimid 20 mg 2 weeks on/1 week off at this time.  If she tolerates Velcade and Darzalex well, will consider dose reduction of Revlimid to 15 mg 2 weeks on/1 week off. - RTC 2 weeks for follow-up.  2.  Mid back pain: - MRI of the thoracic spine showed superior endplate fracture of D59 and wedge fracture of T12. - Status post vertebroplasty of the T11 and T12 by Dr. Reatha Armour on 08/31/2021.  3.  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 Lorenda C[facility] at [facility]. - Continue azithromycin and bedaquiline.  4.  Myeloma bone disease: - She was started on Zometa around 08/16/2021.  She has tolerated it very well. - We have reviewed her labs today.  She will proceed with second dose today.  5.  ID prophylaxis: - She could not tolerate acyclovir due to possible nausea. - We will start her on Valtrex 500 mg twice daily for shingles prophylaxis.  Continue aspirin 81 mg for thromboprophylaxis.     Orders placed this encounter:  No orders of the defined types were placed in this encounter.    Derek Jack, MD [place] [phone]   I, Thana Ates, am acting as a scribe for Dr. Derek Jack.  I, Derek Jack MD, have reviewed the above documentation for accuracy and completeness, and I agree with the above.

## 2021-09-14 LAB — KAPPA/LAMBDA LIGHT CHAINS
Kappa free light chain: 17.4 mg/L (ref 3.3–19.4)
Kappa, lambda light chain ratio: 3.48 — ABNORMAL HIGH (ref 0.26–1.65)
Lambda free light chains: 5 mg/L — ABNORMAL LOW (ref 5.7–26.3)

## 2021-09-17 LAB — PROTEIN ELECTROPHORESIS, SERUM
A/G Ratio: 1.5 (ref 0.7–1.7)
Albumin ELP: 4.3 g/dL (ref 2.9–4.4)
Alpha-1-Globulin: 0.3 g/dL (ref 0.0–0.4)
Alpha-2-Globulin: 0.7 g/dL (ref 0.4–1.0)
Beta Globulin: 1.1 g/dL (ref 0.7–1.3)
Gamma Globulin: 0.7 g/dL (ref 0.4–1.8)
Globulin, Total: 2.8 g/dL (ref 2.2–3.9)
M-Spike, %: 0.2 g/dL — ABNORMAL HIGH
Total Protein ELP: 7.1 g/dL (ref 6.0–8.5)

## 2021-09-20 ENCOUNTER — Other Ambulatory Visit: Payer: Self-pay

## 2021-09-20 ENCOUNTER — Other Ambulatory Visit (HOSPITAL_COMMUNITY): Payer: Medicare Other

## 2021-09-20 ENCOUNTER — Inpatient Hospital Stay (HOSPITAL_COMMUNITY): Payer: Medicare Other

## 2021-09-20 ENCOUNTER — Ambulatory Visit (HOSPITAL_COMMUNITY): Payer: Medicare Other

## 2021-09-20 ENCOUNTER — Encounter (HOSPITAL_COMMUNITY): Payer: Self-pay

## 2021-09-20 VITALS — BP 109/75 | HR 75 | Temp 97.9°F | Resp 18 | Wt 124.5 lb

## 2021-09-20 DIAGNOSIS — Z5112 Encounter for antineoplastic immunotherapy: Secondary | ICD-10-CM | POA: Diagnosis not present

## 2021-09-20 DIAGNOSIS — C9000 Multiple myeloma not having achieved remission: Secondary | ICD-10-CM

## 2021-09-20 LAB — COMPREHENSIVE METABOLIC PANEL
ALT: 16 U/L (ref 0–44)
AST: 22 U/L (ref 15–41)
Albumin: 4.5 g/dL (ref 3.5–5.0)
Alkaline Phosphatase: 81 U/L (ref 38–126)
Anion gap: 9 (ref 5–15)
BUN: 13 mg/dL (ref 8–23)
CO2: 28 mmol/L (ref 22–32)
Calcium: 9.1 mg/dL (ref 8.9–10.3)
Chloride: 97 mmol/L — ABNORMAL LOW (ref 98–111)
Creatinine, Ser: 0.68 mg/dL (ref 0.44–1.00)
GFR, Estimated: >60 mL/min (ref >60)
Glucose, Bld: 104 mg/dL — ABNORMAL HIGH (ref 70–99)
Potassium: 4.5 mmol/L (ref 3.5–5.1)
Sodium: 134 mmol/L — ABNORMAL LOW (ref 135–145)
Total Bilirubin: 0.6 mg/dL (ref 0.3–1.2)
Total Protein: 7.7 g/dL (ref 6.5–8.1)

## 2021-09-20 LAB — CBC WITH DIFFERENTIAL/PLATELET
Abs Immature Granulocytes: 0.01 10*3/uL (ref 0.00–0.07)
Basophils Absolute: 0.1 10*3/uL (ref 0.0–0.1)
Basophils Relative: 1 %
Eosinophils Absolute: 0.4 10*3/uL (ref 0.0–0.5)
Eosinophils Relative: 6 %
HCT: 41.2 % (ref 36.0–46.0)
Hemoglobin: 12.7 g/dL (ref 12.0–15.0)
Immature Granulocytes: 0 %
Lymphocytes Relative: 32 %
Lymphs Abs: 2.2 10*3/uL (ref 0.7–4.0)
MCH: 27.3 pg (ref 26.0–34.0)
MCHC: 30.8 g/dL (ref 30.0–36.0)
MCV: 88.6 fL (ref 80.0–100.0)
Monocytes Absolute: 0.7 10*3/uL (ref 0.1–1.0)
Monocytes Relative: 10 %
Neutro Abs: 3.4 10*3/uL (ref 1.7–7.7)
Neutrophils Relative %: 51 %
Platelets: 364 10*3/uL (ref 150–400)
RBC: 4.65 MIL/uL (ref 3.87–5.11)
RDW: 15 % (ref 11.5–15.5)
WBC: 6.8 10*3/uL (ref 4.0–10.5)
nRBC: 0 % (ref 0.0–0.2)

## 2021-09-20 LAB — MAGNESIUM: Magnesium: 2.2 mg/dL (ref 1.7–2.4)

## 2021-09-20 MED ORDER — DARATUMUMAB-HYALURONIDASE-FIHJ 1800-30000 MG-UT/15ML ~~LOC~~ SOLN
1800.0000 mg | Freq: Once | SUBCUTANEOUS | Status: AC
  Administered 2021-09-20: 12:00:00 1800 mg via SUBCUTANEOUS
  Filled 2021-09-20: qty 15

## 2021-09-20 MED ORDER — ACETAMINOPHEN 325 MG PO TABS
650.0000 mg | ORAL_TABLET | Freq: Once | ORAL | Status: AC
  Administered 2021-09-20: 11:00:00 650 mg via ORAL
  Filled 2021-09-20: qty 2

## 2021-09-20 MED ORDER — DIPHENHYDRAMINE HCL 25 MG PO CAPS
50.0000 mg | ORAL_CAPSULE | Freq: Once | ORAL | Status: AC
  Administered 2021-09-20: 11:00:00 50 mg via ORAL
  Filled 2021-09-20: qty 2

## 2021-09-20 MED ORDER — DEXAMETHASONE 4 MG PO TABS
20.0000 mg | ORAL_TABLET | Freq: Once | ORAL | Status: AC
  Administered 2021-09-20: 11:00:00 20 mg via ORAL
  Filled 2021-09-20: qty 5

## 2021-09-20 MED ORDER — BORTEZOMIB CHEMO SQ INJECTION 3.5 MG (2.5MG/ML)
1.0000 mg/m2 | Freq: Once | INTRAMUSCULAR | Status: AC
  Administered 2021-09-20: 12:00:00 1.75 mg via SUBCUTANEOUS
  Filled 2021-09-20: qty 0.7

## 2021-09-20 NOTE — Progress Notes (Signed)
Patient presents today for Dara and Velcade. Patient reports not being able to sleep well the night after taking Dex and having pressure in her chest several days after treatment, she notices her BP is elevated. Dr. Delton Coombes made aware and advised patient to take additional lisinopril if BP increases. Patient made aware and verbalizes understanding.  ?Patient tolerated Daratumumab and Velcade injections with no complaints voiced. See MAR for details. Lab reviewed. Injection sites clean and dry with no bruising or swelling noted at site. Band aid applied. Vss with discharge and left in satisfactory condition with nos/s of distress noted.   ?

## 2021-09-20 NOTE — Patient Instructions (Signed)
[facility]  Discharge Instructions: ?Thank you for choosing [facility] to provide your oncology and hematology care.  ?If you have a lab appointment with the [facility], please come in thru the Main Entrance and check in at the main information desk. ? ?Wear comfortable clothing and clothing appropriate for easy access to any Portacath or PICC line.  ? ?We strive to give you quality time with your provider. You may need to reschedule your appointment if you arrive late (15 or more minutes).  Arriving late affects you and other patients whose appointments are after yours.  Also, if you miss three or more appointments without notifying the office, you may be dismissed from the clinic at the provider?s discretion.    ?  ?For prescription refill requests, have your pharmacy contact our office and allow 72 hours for refills to be completed.   ? ?Today you received the following Daraumumab and velcade, return as scheduled. ?  ?To help prevent nausea and vomiting after your treatment, we encourage you to take your nausea medication as directed. ? ?BELOW ARE SYMPTOMS THAT SHOULD BE REPORTED IMMEDIATELY: ?*FEVER GREATER THAN 100.4 F (38 ?C) OR HIGHER ?*CHILLS OR SWEATING ?*NAUSEA AND VOMITING THAT IS NOT CONTROLLED WITH YOUR NAUSEA MEDICATION ?*UNUSUAL SHORTNESS OF BREATH ?*UNUSUAL BRUISING OR BLEEDING ?*URINARY PROBLEMS (pain or burning when urinating, or frequent urination) ?*BOWEL PROBLEMS (unusual diarrhea, constipation, pain near the anus) ?TENDERNESS IN MOUTH AND THROAT WITH OR WITHOUT PRESENCE OF ULCERS (sore throat, sores in mouth, or a toothache) ?UNUSUAL RASH, SWELLING OR PAIN  ?UNUSUAL VAGINAL DISCHARGE OR ITCHING  ? ?Items with * indicate a potential emergency and should be followed up as soon as possible or go to the Emergency Department if any problems should occur. ? ?Please show the CHEMOTHERAPY ALERT CARD or IMMUNOTHERAPY ALERT CARD at check-in to the Emergency Department and  triage nurse. ? ?Should you have questions after your visit or need to cancel or reschedule your appointment, please contact [facility] [phone]  and follow the prompts.  Office hours are 8:00 a.m. to 4:30 p.m. Monday - Friday. Please note that voicemails left after 4:00 p.m. may not be returned until the following business day.  We are closed weekends and major holidays. You have access to a nurse at all times for urgent questions. Please call the main number to the clinic [phone] and follow the prompts. ? ?For any non-urgent questions, you may also contact your provider using MyChart. We now offer e-Visits for anyone 66 and older to request care online for non-urgent symptoms. For details visit [email or web address]. ?  ?Also download the MyChart app! Go to the app store, search "MyChart", open the app, select [facility], and log in with your MyChart username and password. ? ?Due to Covid, a mask is required upon entering the hospital/clinic. If you do not have a mask, one will be given to you upon arrival. For doctor visits, patients may have 1 support person aged 62 or older with them. For treatment visits, patients cannot have anyone with them due to current Covid guidelines and our immunocompromised population.  ?

## 2021-09-27 ENCOUNTER — Inpatient Hospital Stay (HOSPITAL_COMMUNITY): Payer: Medicare Other

## 2021-09-27 ENCOUNTER — Other Ambulatory Visit: Payer: Self-pay

## 2021-09-27 ENCOUNTER — Inpatient Hospital Stay (HOSPITAL_BASED_OUTPATIENT_CLINIC_OR_DEPARTMENT_OTHER): Payer: Medicare Other | Admitting: Hematology

## 2021-09-27 ENCOUNTER — Other Ambulatory Visit (HOSPITAL_COMMUNITY): Payer: Self-pay

## 2021-09-27 VITALS — BP 138/86 | HR 81 | Temp 98.5°F | Resp 18 | Ht 60.63 in | Wt 129.4 lb

## 2021-09-27 DIAGNOSIS — Z5112 Encounter for antineoplastic immunotherapy: Secondary | ICD-10-CM | POA: Diagnosis not present

## 2021-09-27 DIAGNOSIS — C9000 Multiple myeloma not having achieved remission: Secondary | ICD-10-CM

## 2021-09-27 DIAGNOSIS — M899 Disorder of bone, unspecified: Secondary | ICD-10-CM

## 2021-09-27 LAB — COMPREHENSIVE METABOLIC PANEL
ALT: 16 U/L (ref 0–44)
AST: 20 U/L (ref 15–41)
Albumin: 4.2 g/dL (ref 3.5–5.0)
Alkaline Phosphatase: 70 U/L (ref 38–126)
Anion gap: 10 (ref 5–15)
BUN: 17 mg/dL (ref 8–23)
CO2: 27 mmol/L (ref 22–32)
Calcium: 9.4 mg/dL (ref 8.9–10.3)
Chloride: 99 mmol/L (ref 98–111)
Creatinine, Ser: 0.7 mg/dL (ref 0.44–1.00)
GFR, Estimated: >60 mL/min (ref >60)
Glucose, Bld: 101 mg/dL — ABNORMAL HIGH (ref 70–99)
Potassium: 4.1 mmol/L (ref 3.5–5.1)
Sodium: 136 mmol/L (ref 135–145)
Total Bilirubin: 0.7 mg/dL (ref 0.3–1.2)
Total Protein: 7.2 g/dL (ref 6.5–8.1)

## 2021-09-27 LAB — CBC WITH DIFFERENTIAL/PLATELET
Abs Immature Granulocytes: 0.01 10*3/uL (ref 0.00–0.07)
Basophils Absolute: 0.1 10*3/uL (ref 0.0–0.1)
Basophils Relative: 1 %
Eosinophils Absolute: 0.3 10*3/uL (ref 0.0–0.5)
Eosinophils Relative: 4 %
HCT: 38.4 % (ref 36.0–46.0)
Hemoglobin: 12.2 g/dL (ref 12.0–15.0)
Immature Granulocytes: 0 %
Lymphocytes Relative: 38 %
Lymphs Abs: 3 10*3/uL (ref 0.7–4.0)
MCH: 28.3 pg (ref 26.0–34.0)
MCHC: 31.8 g/dL (ref 30.0–36.0)
MCV: 89.1 fL (ref 80.0–100.0)
Monocytes Absolute: 0.7 10*3/uL (ref 0.1–1.0)
Monocytes Relative: 8 %
Neutro Abs: 3.9 10*3/uL (ref 1.7–7.7)
Neutrophils Relative %: 49 %
Platelets: 312 10*3/uL (ref 150–400)
RBC: 4.31 MIL/uL (ref 3.87–5.11)
RDW: 15.7 % — ABNORMAL HIGH (ref 11.5–15.5)
WBC: 8 10*3/uL (ref 4.0–10.5)
nRBC: 0 % (ref 0.0–0.2)

## 2021-09-27 LAB — MAGNESIUM: Magnesium: 2 mg/dL (ref 1.7–2.4)

## 2021-09-27 MED ORDER — LENALIDOMIDE 15 MG PO CAPS: 15.0000 mg | ORAL_CAPSULE | Freq: Every day | ORAL | 0 refills | Status: DC

## 2021-09-27 MED ORDER — DEXAMETHASONE 6 MG PO TABS
20.0000 mg | ORAL_TABLET | Freq: Once | ORAL | Status: AC
  Administered 2021-09-27: 20 mg via ORAL
  Filled 2021-09-27: qty 1

## 2021-09-27 MED ORDER — DIPHENHYDRAMINE HCL 50 MG PO CAPS
50.0000 mg | ORAL_CAPSULE | Freq: Once | ORAL | Status: AC
  Administered 2021-09-27: 50 mg via ORAL
  Filled 2021-09-27: qty 1

## 2021-09-27 MED ORDER — BORTEZOMIB CHEMO SQ INJECTION 3.5 MG (2.5MG/ML)
1.0000 mg/m2 | Freq: Once | INTRAMUSCULAR | Status: AC
  Administered 2021-09-27: 1.75 mg via SUBCUTANEOUS
  Filled 2021-09-27: qty 0.7

## 2021-09-27 MED ORDER — DARATUMUMAB-HYALURONIDASE-FIHJ 1800-30000 MG-UT/15ML ~~LOC~~ SOLN
1800.0000 mg | Freq: Once | SUBCUTANEOUS | Status: AC
  Administered 2021-09-27: 1800 mg via SUBCUTANEOUS
  Filled 2021-09-27: qty 15

## 2021-09-27 MED ORDER — VALACYCLOVIR HCL 500 MG PO TABS: 500.0000 mg | ORAL_TABLET | Freq: Two times a day (BID) | ORAL | 6 refills | Status: DC

## 2021-09-27 MED ORDER — ACETAMINOPHEN 325 MG PO TABS
650.0000 mg | ORAL_TABLET | Freq: Once | ORAL | Status: AC
  Administered 2021-09-27: 650 mg via ORAL
  Filled 2021-09-27: qty 2

## 2021-09-27 NOTE — Patient Instructions (Signed)
[place] at [facility] ?Discharge Instructions ? ? ?You were seen and examined today by Dr. Delton Coombes. ? ?He reviewed the results of your lab work.  Your myeloma numbers have improved significantly.  We will plan to restart you on Revlimid at 15 mg 14 days on and 7 days off. All other lab results were normal/stable.  ? ?We will proceed with your treatment today.  ? ?Return as scheduled.  ? ? ?Thank you for choosing [place] at [facility] to provide your oncology and hematology care.  To afford each patient quality time with our provider, please arrive at least 15 minutes before your scheduled appointment time.  ? ?If you have a lab appointment with the [place] please come in thru the Main Entrance and check in at the main information desk. ? ?You need to re-schedule your appointment should you arrive 10 or more minutes late.  We strive to give you quality time with our providers, and arriving late affects you and other patients whose appointments are after yours.  Also, if you no show three or more times for appointments you may be dismissed from the clinic at the providers discretion.     ?Again, thank you for choosing [facility].  Our hope is that these requests will decrease the amount of time that you wait before being seen by our physicians.       ?_____________________________________________________________ ? ?Should you have questions after your visit to [facility], please contact our office at [phone] and follow the prompts.  Our office hours are 8:00 a.m. and 4:30 p.m. Monday - Friday.  Please note that voicemails left after 4:00 p.m. may not be returned until the following business day.  We are closed weekends and major holidays.  You do have access to a nurse 24-7, just call the main number to the clinic [phone] and do not press any options, hold on the line and a nurse will answer the phone.   ? ?For  prescription refill requests, have your pharmacy contact our office and allow 72 hours.   ? ?Due to Covid, you will need to wear a mask upon entering the hospital. If you do not have a mask, a mask will be given to you at the Main Entrance upon arrival. For doctor visits, patients may have 1 support person age 53 or older with them. For treatment visits, patients can not have anyone with them due to social distancing guidelines and our immunocompromised population.  ? ?   ?

## 2021-09-27 NOTE — Patient Instructions (Signed)
[place]  Discharge Instructions: ?Thank you for choosing [place] to provide your oncology and hematology care.  ?If you have a lab appointment with the [place], please come in thru the Main Entrance and check in at the main information desk. ? ?Wear comfortable clothing and clothing appropriate for easy access to any Portacath or PICC line.  ? ?We strive to give you quality time with your provider. You may need to reschedule your appointment if you arrive late (15 or more minutes).  Arriving late affects you and other patients whose appointments are after yours.  Also, if you miss three or more appointments without notifying the office, you may be dismissed from the clinic at the provider?s discretion.    ?  ?For prescription refill requests, have your pharmacy contact our office and allow 72 hours for refills to be completed.   ? ?Today you received the following chemotherapy and/or immunotherapy agents daratumumab and velcade.  ?  ?To help prevent nausea and vomiting after your treatment, we encourage you to take your nausea medication as directed. ? ?BELOW ARE SYMPTOMS THAT SHOULD BE REPORTED IMMEDIATELY: ?*FEVER GREATER THAN 100.4 F (38 ?C) OR HIGHER ?*CHILLS OR SWEATING ?*NAUSEA AND VOMITING THAT IS NOT CONTROLLED WITH YOUR NAUSEA MEDICATION ?*UNUSUAL SHORTNESS OF BREATH ?*UNUSUAL BRUISING OR BLEEDING ?*URINARY PROBLEMS (pain or burning when urinating, or frequent urination) ?*BOWEL PROBLEMS (unusual diarrhea, constipation, pain near the anus) ?TENDERNESS IN MOUTH AND THROAT WITH OR WITHOUT PRESENCE OF ULCERS (sore throat, sores in mouth, or a toothache) ?UNUSUAL RASH, SWELLING OR PAIN  ?UNUSUAL VAGINAL DISCHARGE OR ITCHING  ? ?Items with * indicate a potential emergency and should be followed up as soon as possible or go to the Emergency Department if any problems should occur. ? ?Please show the CHEMOTHERAPY ALERT CARD or IMMUNOTHERAPY ALERT CARD at check-in to the  Emergency Department and triage nurse. ? ?Should you have questions after your visit or need to cancel or reschedule your appointment, please contact [facility] [phone]  and follow the prompts.  Office hours are 8:00 a.m. to 4:30 p.m. Monday - Friday. Please note that voicemails left after 4:00 p.m. may not be returned until the following business day.  We are closed weekends and major holidays. You have access to a nurse at all times for urgent questions. Please call the main number to the clinic [phone] and follow the prompts. ? ?For any non-urgent questions, you may also contact your provider using MyChart. We now offer e-Visits for anyone 24 and older to request care online for non-urgent symptoms. For details visit [email or web address]. ?  ?Also download the MyChart app! Go to the app store, search "MyChart", open the app, select [place], and log in with your MyChart username and password. ? ?Due to Covid, a mask is required upon entering the hospital/clinic. If you do not have a mask, one will be given to you upon arrival. For doctor visits, patients may have 1 support person aged 2 or older with them. For treatment visits, patients cannot have anyone with them due to current Covid guidelines and our immunocompromised population.  ?

## 2021-09-27 NOTE — Progress Notes (Signed)
Patient tolerated Daratumumab and Velcade injection with no complaints voiced.  See MAR for details.  Labs reviewed. Injection site clean and dry with no bruising or swelling noted at site.  Band aid applied.  Vss with discharge and left in satisfactory condition with no s/s of distress noted.   

## 2021-09-27 NOTE — Telephone Encounter (Signed)
New revlimid prescription sent to [facility] by [facility] per Dr. Raliegh Ip verbal order. ?

## 2021-09-27 NOTE — Progress Notes (Signed)
Patient has been examined by Dr. Katragadda, and vital signs and labs have been reviewed. ANC, Creatinine, LFTs, hemoglobin, and platelets are within treatment parameters per M.D. - pt may proceed with treatment.    °

## 2021-09-27 NOTE — Progress Notes (Signed)
? ?[facility] ?[address] ?[place], [place] [address] ? ? ?CLINIC:  ?Medical Oncology/Hematology ? ?PCP:  ?Hungarland, Jenetta Downer, MD ?[facility] /* ?[phone] ? ? ?REASON FOR VISIT:  ?Follow-up for multiple myeloma ? ?PRIOR THERAPY: none ? ?NGS Results: not done ? ?CURRENT THERAPY: DaraVRd (Daratumumab SQ) q21d x 6 Cycles (Induction/Consolidation) ? ?BRIEF ONCOLOGIC HISTORY:  ?Oncology History  ?Multiple myeloma ([place])  ?07/17/2021 Initial Diagnosis  ? Multiple myeloma ([place]) ?  ?07/26/2021 -  Chemotherapy  ? Patient is on Treatment Plan : MYELOMA NEWLY DIAGNOSED TRANSPLANT CANDIDATE DaraVRd (Daratumumab SQ) q21d x 6 Cycles (Induction/Consolidation)  ?   ? ? ?CANCER STAGING: ? Cancer Staging  ?Multiple myeloma ([place]) ?Staging form: Multiple Myeloma, AJCC 6th Edition ?- Clinical stage from 07/17/2021: Stage IA - Unsigned ? ? ?INTERVAL HISTORY:  ?[name], a 71 y.o. female, returns for routine follow-up and consideration for next cycle of chemotherapy. Olla was last seen on 09/13/2021. ? ?Due for day #15 cycle #2 of DaraVRd today.  ? ?Overall, she tells me she has been feeling pretty well. She reports feeling pressure on her chest. She reports disrupted sleep following treatment. She has not been taking aspirin or Valtrex. She reports cough productive of small amount of yellow sputum. She denies fevers and chills.  ? ?Overall, she feels ready for next cycle of chemo today.  ? ?REVIEW OF SYSTEMS:  ?Review of Systems  ?Constitutional:  Positive for fatigue. Negative for appetite change, chills and fever.  ?Respiratory:  Positive for cough and shortness of breath.   ?Cardiovascular:  Positive for chest pain (pressure).  ?Gastrointestinal:  Positive for constipation.  ?Musculoskeletal:  Positive for back pain.  ?Psychiatric/Behavioral:  Positive for sleep disturbance.   ?All other systems reviewed and are negative. ? ?PAST MEDICAL/SURGICAL  HISTORY:  ?Past Medical History:  ?Diagnosis Date  ? Arthritis   ? osteoarthritis  ? Back pain   ? Bronchiectasis ([place])   ? Cancer [facility])   ? basal cell nose  ? Chronic cough   ? Complication of anesthesia   ? Dyspnea   ? Gastrointestinal symptoms   ? GERD (gastroesophageal reflux disease)   ? Glaucoma   ? History of hiatal hernia   ? Hyperlipemia   ? Hypertension   ? Multiple myeloma ([place])   ? Osteopenia   ? Pneumonia   ? PONV (postoperative nausea and vomiting)   ? "only one time"  ? Spondylisthesis   ? ?Past Surgical History:  ?Procedure Laterality Date  ? ABDOMINAL EXPOSURE N/A 12/29/2018  ? Procedure: ABDOMINAL EXPOSURE;  Surgeon: Angelia Mould, MD;  Location: [place];  Service: Vascular;  Laterality: N/A;  ? ABDOMINOPLASTY  2002  ? ABDOMINOPLASTY  1970's  ? ANKLE SURGERY Left 10/2015  ? ANTERIOR LATERAL LUMBAR FUSION WITH PERCUTANEOUS SCREW 3 LEVEL Left 12/29/2018  ? Procedure: Lumbar Two to Lumbar Five Anterolateral lumbar interbody fusion;  Surgeon: Erline Levine, MD;  Location: [place];  Service: Neurosurgery;  Laterality: Left;  anterolateral  ? ANTERIOR LUMBAR FUSION N/A 12/29/2018  ? Procedure: Lumbar Five Sacral One Anterior lumbar interbody fusion;  Surgeon: Erline Levine, MD;  Location: [place];  Service: Neurosurgery;  Laterality: N/A;  anterior approach  ? BASAL CELL CARCINOMA EXCISION  06/2018  ? nose  ? BLEPHAROPLASTY Bilateral 1999  ? BREAST ENHANCEMENT SURGERY  1970's  ? COLONOSCOPY    ? CYSTOURETHROSCOPY  2018  ? Doble J ureteal stents  ?  DECOMPRESSION CORE HIP Left 2014  ? FACIAL COSMETIC SURGERY  2004  ? FLUOROSCOPY GUIDANCE    ? HIP ARTHROPLASTY Left   ? INCISIONAL HERNIA REPAIR N/A 10/08/2019  ? Procedure: OPEN INCISIONAL HERNIA REPAIR WITH MESH;  Surgeon: Armandina Gemma, MD;  Location: [facility];  Service: General;  Laterality: N/A;  ? JOINT REPLACEMENT Left   ? Hip; 05/2014  ? KYPHOPLASTY N/A 08/31/2021  ? Procedure: KYPHOPLASTY THORACIC ELEVEN, THORACIC TWELVE;  Surgeon: Dawley, Theodoro Doing, DO;   Location: [facility];  Service: Neurosurgery;  Laterality: N/A;  ? LAPAROSCOPIC PARTIAL COLECTOMY  06/26/2017  ? LUMBAR PERCUTANEOUS PEDICLE SCREW 4 LEVEL N/A 12/29/2018  ? Procedure: Lumbar Percutaneous Pedicle Screw Placement Lumbar two-Sacral one;  Surgeon: Erline Levine, MD;  Location: [place];  Service: Neurosurgery;  Laterality: N/A;  ? MOHS SURGERY  2019  ? nose  ? PARTIAL COLECTOMY  06/2017  ? for diverticulitis  ? REMOVAL OF BILATERAL TISSUE EXPANDERS WITH PLACEMENT OF BILATERAL BREAST IMPLANTS  2004  ? THIGH LIFT  1994  ? TOE FUSION Right   ? great toe  ? TONSILLECTOMY    ? UMBILICAL HERNIA REPAIR    ? with tummy tuck  ? UMBILICAL HERNIA REPAIR  2002  ? ? ?SOCIAL HISTORY:  ?Social History  ? ?Socioeconomic History  ? Marital status: Married  ?  Spouse name: Not on file  ? Number of children: Not on file  ? Years of education: Not on file  ? Highest education level: Not on file  ?Occupational History  ? Not on file  ?Tobacco Use  ? Smoking status: Never  ? Smokeless tobacco: Never  ?Vaping Use  ? Vaping Use: Never used  ?Substance and Sexual Activity  ? Alcohol use: Yes  ?  Comment: occasional  ? Drug use: Never  ? Sexual activity: Not on file  ?Other Topics Concern  ? Not on file  ?Social History Narrative  ? Not on file  ? ?Social Determinants of Health  ? ?Financial Resource Strain: Low Risk   ? Difficulty of Paying Living Expenses: Not hard at all  ?Food Insecurity: No Food Insecurity  ? Worried About Charity fundraiser in the Last Year: Never true  ? Ran Out of Food in the Last Year: Never true  ?Transportation Needs: No Transportation Needs  ? Lack of Transportation (Medical): No  ? Lack of Transportation (Non-Medical): No  ?Physical Activity: Not on file  ?Stress: Not on file  ?Social Connections: Socially Integrated  ? Frequency of Communication with Friends and Family: More than three times a week  ? Frequency of Social Gatherings with Friends and Family: More than three times a week  ? Attends Religious  Services: 1 to 4 times per year  ? Active Member of Clubs or Organizations: No  ? Attends Archivist Meetings: 1 to 4 times per year  ? Marital Status: Married  ?Intimate Partner Violence: Not on file  ? ? ?FAMILY HISTORY:  ?Family History  ?Problem Relation Age of Onset  ? Hypertension Mother   ? COPD Mother   ? Hypertension Father   ? Heart disease Father   ? ? ?CURRENT MEDICATIONS:  ?Current Outpatient Medications  ?Medication Sig Dispense Refill  ? acyclovir (ZOVIRAX) 400 MG tablet Take 1 tablet (400 mg total) by mouth 2 (two) times daily. 60 tablet 4  ? albuterol (VENTOLIN HFA) 108 (90 Base) MCG/ACT inhaler Inhale 2 puffs into the lungs every 6 (six) hours as needed for  wheezing or shortness of breath.    ? aspirin EC 81 MG tablet Take 81 mg by mouth daily. Swallow whole.    ? azithromycin (ZITHROMAX) 250 MG tablet Take 250 mg by mouth daily.    ? Bedaquiline Fumarate (SIRTURO) 100 MG TABS Take 200 mg by mouth every Monday, Wednesday, and Friday.    ? benzonatate (TESSALON) 100 MG capsule Take 100 mg by mouth 3 (three) times daily as needed for cough.    ? Biotin 1000 MCG tablet Take 1,000 mcg by mouth daily.    ? calcitonin, salmon, (MIACALCIN/FORTICAL) 200 UNIT/ACT nasal spray Place 1 spray into alternate nostrils daily.    ? Calcium Carb-Cholecalciferol 600-800 MG-UNIT TABS Take 1 tablet by mouth daily.    ? daratumumab-hyaluronidase-fihj (DARZALEX FASPRO) 1800-30000 MG-UT/15ML SOLN Inject 1,800 mg into the skin once a week.    ? dexamethasone (DECADRON) 2 MG tablet Take 5 tablets (10 mg total) by mouth once a week. 20 tablet 4  ? dextromethorphan-guaiFENesin (MUCINEX DM) 30-600 MG 12hr tablet Take 1 tablet by mouth daily.     ? estrogen, conjugated,-medroxyprogesterone (PREMPRO) 0.45-1.5 MG tablet Take 1 tablet by mouth daily.    ? feeding supplement (ENSURE ENLIVE / ENSURE PLUS) LIQD Take 237 mLs by mouth 2 (two) times daily between meals. (Patient taking differently: Take 237 mLs by mouth  daily.) 237 mL 12  ? fluconazole (DIFLUCAN) 150 MG tablet Take by mouth.    ? fluticasone (FLONASE) 50 MCG/ACT nasal spray Place 1 spray into both nostrils daily.    ? hydrochlorothiazide (HYDRODIURIL) 25 MG tab

## 2021-10-04 ENCOUNTER — Inpatient Hospital Stay (HOSPITAL_COMMUNITY): Payer: Medicare Other

## 2021-10-04 ENCOUNTER — Other Ambulatory Visit: Payer: Self-pay

## 2021-10-04 VITALS — BP 134/75 | HR 77 | Temp 97.6°F | Resp 18 | Ht 61.06 in | Wt 127.0 lb

## 2021-10-04 DIAGNOSIS — C9000 Multiple myeloma not having achieved remission: Secondary | ICD-10-CM

## 2021-10-04 DIAGNOSIS — Z5112 Encounter for antineoplastic immunotherapy: Secondary | ICD-10-CM | POA: Diagnosis not present

## 2021-10-04 LAB — CBC WITH DIFFERENTIAL/PLATELET
Abs Immature Granulocytes: 0.01 10*3/uL (ref 0.00–0.07)
Basophils Absolute: 0.1 10*3/uL (ref 0.0–0.1)
Basophils Relative: 1 %
Eosinophils Absolute: 0.3 10*3/uL (ref 0.0–0.5)
Eosinophils Relative: 3 %
HCT: 37 % (ref 36.0–46.0)
Hemoglobin: 11.6 g/dL — ABNORMAL LOW (ref 12.0–15.0)
Immature Granulocytes: 0 %
Lymphocytes Relative: 32 %
Lymphs Abs: 2.5 10*3/uL (ref 0.7–4.0)
MCH: 28 pg (ref 26.0–34.0)
MCHC: 31.4 g/dL (ref 30.0–36.0)
MCV: 89.2 fL (ref 80.0–100.0)
Monocytes Absolute: 0.7 10*3/uL (ref 0.1–1.0)
Monocytes Relative: 9 %
Neutro Abs: 4.3 10*3/uL (ref 1.7–7.7)
Neutrophils Relative %: 55 %
Platelets: 281 10*3/uL (ref 150–400)
RBC: 4.15 MIL/uL (ref 3.87–5.11)
RDW: 16.3 % — ABNORMAL HIGH (ref 11.5–15.5)
WBC: 7.9 10*3/uL (ref 4.0–10.5)
nRBC: 0 % (ref 0.0–0.2)

## 2021-10-04 LAB — COMPREHENSIVE METABOLIC PANEL
ALT: 16 U/L (ref 0–44)
AST: 20 U/L (ref 15–41)
Albumin: 4.2 g/dL (ref 3.5–5.0)
Alkaline Phosphatase: 66 U/L (ref 38–126)
Anion gap: 9 (ref 5–15)
BUN: 16 mg/dL (ref 8–23)
CO2: 27 mmol/L (ref 22–32)
Calcium: 9.1 mg/dL (ref 8.9–10.3)
Chloride: 100 mmol/L (ref 98–111)
Creatinine, Ser: 0.75 mg/dL (ref 0.44–1.00)
GFR, Estimated: >60 mL/min (ref >60)
Glucose, Bld: 94 mg/dL (ref 70–99)
Potassium: 4.7 mmol/L (ref 3.5–5.1)
Sodium: 136 mmol/L (ref 135–145)
Total Bilirubin: 0.6 mg/dL (ref 0.3–1.2)
Total Protein: 7 g/dL (ref 6.5–8.1)

## 2021-10-04 LAB — MAGNESIUM: Magnesium: 2.1 mg/dL (ref 1.7–2.4)

## 2021-10-04 MED ORDER — ACETAMINOPHEN 325 MG PO TABS
650.0000 mg | ORAL_TABLET | Freq: Once | ORAL | Status: AC
  Administered 2021-10-04: 650 mg via ORAL
  Filled 2021-10-04: qty 2

## 2021-10-04 MED ORDER — DIPHENHYDRAMINE HCL 25 MG PO CAPS
50.0000 mg | ORAL_CAPSULE | Freq: Once | ORAL | Status: AC
  Administered 2021-10-04: 50 mg via ORAL
  Filled 2021-10-04: qty 2

## 2021-10-04 MED ORDER — BORTEZOMIB CHEMO SQ INJECTION 3.5 MG (2.5MG/ML)
1.0000 mg/m2 | Freq: Once | INTRAMUSCULAR | Status: AC
  Administered 2021-10-04: 1.75 mg via SUBCUTANEOUS
  Filled 2021-10-04: qty 0.7

## 2021-10-04 MED ORDER — DEXAMETHASONE 4 MG PO TABS
20.0000 mg | ORAL_TABLET | Freq: Once | ORAL | Status: AC
  Administered 2021-10-04: 20 mg via ORAL
  Filled 2021-10-04: qty 5

## 2021-10-04 MED ORDER — DARATUMUMAB-HYALURONIDASE-FIHJ 1800-30000 MG-UT/15ML ~~LOC~~ SOLN
1800.0000 mg | Freq: Once | SUBCUTANEOUS | Status: AC
  Administered 2021-10-04: 1800 mg via SUBCUTANEOUS
  Filled 2021-10-04: qty 15

## 2021-10-04 NOTE — Progress Notes (Signed)
Pt presents today for Amanda Conner and Velcade injection per provider's order. Labs and vitals WNL for treatment today. Okay to proceed with treatment today. ? ?Amanda Conner and Velcade injection given today per MD orders. Tolerated infusion without adverse affects. Vital signs stable. No complaints at this time. Discharged from clinic via wheelchair in stable condition. Alert and oriented x 3. F/U with [facility] as scheduled.   ?

## 2021-10-04 NOTE — Progress Notes (Signed)
Give Velcade at dose of 1 mg/m2 today ? ?T.O. Dr Rhys Martini, PharmD ?

## 2021-10-04 NOTE — Patient Instructions (Signed)
[place]  Discharge Instructions: ?Thank you for choosing [facility] to provide your oncology and hematology care.  ?If you have a lab appointment with the [facility], please come in thru the Main Entrance and check in at the main information desk. ? ?Wear comfortable clothing and clothing appropriate for easy access to any Portacath or PICC line.  ? ?We strive to give you quality time with your provider. You may need to reschedule your appointment if you arrive late (15 or more minutes).  Arriving late affects you and other patients whose appointments are after yours.  Also, if you miss three or more appointments without notifying the office, you may be dismissed from the clinic at the provider?s discretion.    ?  ?For prescription refill requests, have your pharmacy contact our office and allow 72 hours for refills to be completed.   ? ?Today you received the following chemotherapy and/or immunotherapy agents Velcade and Dara SQ. ?  ?To help prevent nausea and vomiting after your treatment, we encourage you to take your nausea medication as directed. ? ?BELOW ARE SYMPTOMS THAT SHOULD BE REPORTED IMMEDIATELY: ?*FEVER GREATER THAN 100.4 F (38 ?C) OR HIGHER ?*CHILLS OR SWEATING ?*NAUSEA AND VOMITING THAT IS NOT CONTROLLED WITH YOUR NAUSEA MEDICATION ?*UNUSUAL SHORTNESS OF BREATH ?*UNUSUAL BRUISING OR BLEEDING ?*URINARY PROBLEMS (pain or burning when urinating, or frequent urination) ?*BOWEL PROBLEMS (unusual diarrhea, constipation, pain near the anus) ?TENDERNESS IN MOUTH AND THROAT WITH OR WITHOUT PRESENCE OF ULCERS (sore throat, sores in mouth, or a toothache) ?UNUSUAL RASH, SWELLING OR PAIN  ?UNUSUAL VAGINAL DISCHARGE OR ITCHING  ? ?Items with * indicate a potential emergency and should be followed up as soon as possible or go to the Emergency Department if any problems should occur. ? ?Please show the CHEMOTHERAPY ALERT CARD or IMMUNOTHERAPY ALERT CARD at check-in to the  Emergency Department and triage nurse. ? ?Should you have questions after your visit or need to cancel or reschedule your appointment, please contact [facility] [phone]  and follow the prompts.  Office hours are 8:00 a.m. to 4:30 p.m. Monday - Friday. Please note that voicemails left after 4:00 p.m. may not be returned until the following business day.  We are closed weekends and major holidays. You have access to a nurse at all times for urgent questions. Please call the main number to the clinic [phone] and follow the prompts. ? ?For any non-urgent questions, you may also contact your provider using MyChart. We now offer e-Visits for anyone 102 and older to request care online for non-urgent symptoms. For details visit [email or web address]. ?  ?Also download the MyChart app! Go to the app store, search "MyChart", open the app, select [place], and log in with your MyChart username and password. ? ?Due to Covid, a mask is required upon entering the hospital/clinic. If you do not have a mask, one will be given to you upon arrival. For doctor visits, patients may have 1 support person aged 57 or older with them. For treatment visits, patients cannot have anyone with them due to current Covid guidelines and our immunocompromised population.  ?

## 2021-10-05 ENCOUNTER — Other Ambulatory Visit (HOSPITAL_COMMUNITY): Payer: Self-pay | Admitting: *Deleted

## 2021-10-05 MED ORDER — FUROSEMIDE 20 MG PO TABS: 20.0000 mg | ORAL_TABLET | Freq: Every day | ORAL | 2 refills | Status: DC | PRN

## 2021-10-10 ENCOUNTER — Other Ambulatory Visit (HOSPITAL_COMMUNITY): Payer: Self-pay | Admitting: Hematology

## 2021-10-11 ENCOUNTER — Inpatient Hospital Stay (HOSPITAL_COMMUNITY): Payer: Medicare Other

## 2021-10-11 ENCOUNTER — Inpatient Hospital Stay (HOSPITAL_COMMUNITY): Payer: Medicare Other | Admitting: Dietician

## 2021-10-11 ENCOUNTER — Inpatient Hospital Stay (HOSPITAL_BASED_OUTPATIENT_CLINIC_OR_DEPARTMENT_OTHER): Payer: Medicare Other | Admitting: Hematology

## 2021-10-11 VITALS — BP 140/68 | HR 72 | Temp 97.5°F | Resp 18 | Ht 61.06 in | Wt 126.8 lb

## 2021-10-11 VITALS — BP 134/67 | HR 66 | Temp 97.2°F | Resp 18

## 2021-10-11 DIAGNOSIS — M899 Disorder of bone, unspecified: Secondary | ICD-10-CM | POA: Diagnosis not present

## 2021-10-11 DIAGNOSIS — C9000 Multiple myeloma not having achieved remission: Secondary | ICD-10-CM | POA: Diagnosis not present

## 2021-10-11 DIAGNOSIS — Z5112 Encounter for antineoplastic immunotherapy: Secondary | ICD-10-CM | POA: Diagnosis not present

## 2021-10-11 LAB — CBC WITH DIFFERENTIAL/PLATELET
Abs Immature Granulocytes: 0.01 10*3/uL (ref 0.00–0.07)
Basophils Absolute: 0 10*3/uL (ref 0.0–0.1)
Basophils Relative: 1 %
Eosinophils Absolute: 0.6 10*3/uL — ABNORMAL HIGH (ref 0.0–0.5)
Eosinophils Relative: 9 %
HCT: 36.6 % (ref 36.0–46.0)
Hemoglobin: 11.5 g/dL — ABNORMAL LOW (ref 12.0–15.0)
Immature Granulocytes: 0 %
Lymphocytes Relative: 34 %
Lymphs Abs: 2.1 10*3/uL (ref 0.7–4.0)
MCH: 27.9 pg (ref 26.0–34.0)
MCHC: 31.4 g/dL (ref 30.0–36.0)
MCV: 88.8 fL (ref 80.0–100.0)
Monocytes Absolute: 0.5 10*3/uL (ref 0.1–1.0)
Monocytes Relative: 8 %
Neutro Abs: 3.1 10*3/uL (ref 1.7–7.7)
Neutrophils Relative %: 48 %
Platelets: 277 10*3/uL (ref 150–400)
RBC: 4.12 MIL/uL (ref 3.87–5.11)
RDW: 17 % — ABNORMAL HIGH (ref 11.5–15.5)
WBC: 6.4 10*3/uL (ref 4.0–10.5)
nRBC: 0 % (ref 0.0–0.2)

## 2021-10-11 LAB — COMPREHENSIVE METABOLIC PANEL
ALT: 18 U/L (ref 0–44)
AST: 19 U/L (ref 15–41)
Albumin: 4.1 g/dL (ref 3.5–5.0)
Alkaline Phosphatase: 62 U/L (ref 38–126)
Anion gap: 7 (ref 5–15)
BUN: 8 mg/dL (ref 8–23)
CO2: 26 mmol/L (ref 22–32)
Calcium: 8.8 mg/dL — ABNORMAL LOW (ref 8.9–10.3)
Chloride: 101 mmol/L (ref 98–111)
Creatinine, Ser: 0.6 mg/dL (ref 0.44–1.00)
GFR, Estimated: >60 mL/min (ref >60)
Glucose, Bld: 90 mg/dL (ref 70–99)
Potassium: 4.7 mmol/L (ref 3.5–5.1)
Sodium: 134 mmol/L — ABNORMAL LOW (ref 135–145)
Total Bilirubin: 0.4 mg/dL (ref 0.3–1.2)
Total Protein: 6.8 g/dL (ref 6.5–8.1)

## 2021-10-11 LAB — MAGNESIUM: Magnesium: 2.1 mg/dL (ref 1.7–2.4)

## 2021-10-11 LAB — LACTATE DEHYDROGENASE: LDH: 116 U/L (ref 98–192)

## 2021-10-11 MED ORDER — DARATUMUMAB-HYALURONIDASE-FIHJ 1800-30000 MG-UT/15ML ~~LOC~~ SOLN
1800.0000 mg | Freq: Once | SUBCUTANEOUS | Status: AC
  Administered 2021-10-11: 1800 mg via SUBCUTANEOUS
  Filled 2021-10-11: qty 15

## 2021-10-11 MED ORDER — ACETAMINOPHEN 325 MG PO TABS
650.0000 mg | ORAL_TABLET | Freq: Once | ORAL | Status: AC
  Administered 2021-10-11: 650 mg via ORAL
  Filled 2021-10-11: qty 2

## 2021-10-11 MED ORDER — ZOLEDRONIC ACID 4 MG/100ML IV SOLN
4.0000 mg | Freq: Once | INTRAVENOUS | Status: AC
  Administered 2021-10-11: 4 mg via INTRAVENOUS
  Filled 2021-10-11: qty 100

## 2021-10-11 MED ORDER — BORTEZOMIB CHEMO SQ INJECTION 3.5 MG (2.5MG/ML)
1.0000 mg/m2 | Freq: Once | INTRAMUSCULAR | Status: AC
  Administered 2021-10-11: 1.75 mg via SUBCUTANEOUS
  Filled 2021-10-11: qty 0.7

## 2021-10-11 MED ORDER — DEXAMETHASONE 4 MG PO TABS
20.0000 mg | ORAL_TABLET | Freq: Once | ORAL | Status: AC
  Administered 2021-10-11: 20 mg via ORAL
  Filled 2021-10-11: qty 5

## 2021-10-11 MED ORDER — SODIUM CHLORIDE 0.9 % IV SOLN: Freq: Once | INTRAVENOUS | Status: AC

## 2021-10-11 MED ORDER — DIPHENHYDRAMINE HCL 25 MG PO CAPS
50.0000 mg | ORAL_CAPSULE | Freq: Once | ORAL | Status: AC
  Administered 2021-10-11: 50 mg via ORAL
  Filled 2021-10-11: qty 2

## 2021-10-11 NOTE — Progress Notes (Signed)
Nutrition Follow-up: ? ?Patient with multiple myeloma. She is receiving DaraVRd q21d. ? ?Met with patient and her husband during clinic. Patient reports appetite has improved. She has felt at times appetite has been too good and eating more junk foods. She and her husband are more mindful of their food choices. Patient is eating 3 meals plus snacks. She enjoys avocado toast and sliced banana for breakfast. They are including good sources of protein at meal times. She is drinking one Ensure Original. Patient finds the higher calorie/protein supplements too thick. Patient continues to have altered taste to sweets. She also has not cared much for milk recently. Patient is going to try lactose free milk. She tolerated this better than regular milk in the past. She denies nausea, vomiting, constipation. She has been having diarrhea the past couple of days. Patient feels this is related to take out food. She is feeling better today s/p following bland diet. ? ?Medications: acyclovir, lasix, revlimid, baclofen ? ?Labs: Na 134 ? ?Anthropometrics: Weight 126 lb 12.2 oz today stable x 1 week ? ?3/23 - 127 lb  ?3/16 - 129 lb 6.4 oz  ?3/02 - 129 lb  ? ? ?NUTRITION DIAGNOSIS: Unintentional weight loss stable  ? ? ?INTERVENTION:  ?Continue eating small frequent meals and snacks with adequate calories and protein  ?Continue drinking 1-2 Ensure, suggested trying CIB with whole milk for alternate high calorie/protein option ?Discussed strategies for diarrhea - handout provided  ?  ? ?MONITORING, EVALUATION, GOAL: weight trends, intake ? ? ?NEXT VISIT: Thursday April 20 during injection  ? ? ? ?

## 2021-10-11 NOTE — Patient Instructions (Signed)
[facility]  Discharge Instructions: Thank you for choosing [facility] to provide your oncology and hematology care.  If you have a lab appointment with the Cancer Center, please come in thru the Main Entrance and check in at the main information desk.  Wear comfortable clothing and clothing appropriate for easy access to any Portacath or PICC line.   We strive to give you quality time with your provider. You may need to reschedule your appointment if you arrive late (15 or more minutes).  Arriving late affects you and other patients whose appointments are after yours.  Also, if you miss three or more appointments without notifying the office, you may be dismissed from the clinic at the provider's discretion.      For prescription refill requests, have your pharmacy contact our office and allow 72 hours for refills to be completed.        To help prevent nausea and vomiting after your treatment, we encourage you to take your nausea medication as directed.  BELOW ARE SYMPTOMS THAT SHOULD BE REPORTED IMMEDIATELY: *FEVER GREATER THAN 100.4 F (38 C) OR HIGHER *CHILLS OR SWEATING *NAUSEA AND VOMITING THAT IS NOT CONTROLLED WITH YOUR NAUSEA MEDICATION *UNUSUAL SHORTNESS OF BREATH *UNUSUAL BRUISING OR BLEEDING *URINARY PROBLEMS (pain or burning when urinating, or frequent urination) *BOWEL PROBLEMS (unusual diarrhea, constipation, pain near the anus) TENDERNESS IN MOUTH AND THROAT WITH OR WITHOUT PRESENCE OF ULCERS (sore throat, sores in mouth, or a toothache) UNUSUAL RASH, SWELLING OR PAIN  UNUSUAL VAGINAL DISCHARGE OR ITCHING   Items with * indicate a potential emergency and should be followed up as soon as possible or go to the Emergency Department if any problems should occur.  Please show the CHEMOTHERAPY ALERT CARD or IMMUNOTHERAPY ALERT CARD at check-in to the Emergency Department and triage nurse.  Should you have questions after your visit or need to cancel  or reschedule your appointment, please contact [facility] [phone]  and follow the prompts.  Office hours are 8:00 a.m. to 4:30 p.m. Monday - Friday. Please note that voicemails left after 4:00 p.m. may not be returned until the following business day.  We are closed weekends and major holidays. You have access to a nurse at all times for urgent questions. Please call the main number to the clinic [phone] and follow the prompts.  For any non-urgent questions, you may also contact your provider using MyChart. We now offer e-Visits for anyone 18 and older to request care online for non-urgent symptoms. For details visit [email or web address].   Also download the MyChart app! Go to the app store, search "MyChart", open the app, select [place], and log in with your MyChart username and password.  Due to Covid, a mask is required upon entering the hospital/clinic. If you do not have a mask, one will be given to you upon arrival. For doctor visits, patients may have 1 support person aged 18 or older with them. For treatment visits, patients cannot have anyone with them due to current Covid guidelines and our immunocompromised population.  

## 2021-10-11 NOTE — Progress Notes (Signed)
? ?[facility] ?[address] ?[place], [place] [address] ? ? ?CLINIC:  ?Medical Oncology/Hematology ? ?PCP:  ?Hungarland, Jenetta Downer, MD ?[facility] /* ?[phone] ? ? ?REASON FOR VISIT:  ?Follow-up for multiple myeloma ? ?PRIOR THERAPY: none ? ?NGS Results: not done ? ?CURRENT THERAPY: DaraVRd (Daratumumab SQ) q21d x 6 Cycles (Induction/Consolidation) ? ?BRIEF ONCOLOGIC HISTORY:  ?Oncology History  ?Multiple myeloma ([place])  ?07/17/2021 Initial Diagnosis  ? Multiple myeloma ([place]) ?  ?07/26/2021 -  Chemotherapy  ? Patient is on Treatment Plan : MYELOMA NEWLY DIAGNOSED TRANSPLANT CANDIDATE DaraVRd (Daratumumab SQ) q21d x 6 Cycles (Induction/Consolidation)  ?   ? ? ?CANCER STAGING: ? Cancer Staging  ?Multiple myeloma ([place]) ?Staging form: Multiple Myeloma, AJCC 6th Edition ?- Clinical stage from 07/17/2021: Stage IA - Unsigned ? ? ?INTERVAL HISTORY:  ?Amanda Conner, a 71 y.o. female, returns for routine follow-up and consideration for next cycle of chemotherapy. Amanda Conner was last seen on 09/27/2021. ? ?Due for day #8 cycle #3 of DaraVRd today.  ? ?Overall, she tells me she has been feeling pretty well. She reports diarrhea for 2 days which is improving with Imodium and Pepto bismol. Her back has improved slightly. She restarted Revlimid 03/23. She denies diarrhea when she previously took Revlimid. She reports swelling in her feet and ankles which is worse in her right foot and ankle. She reports her BP and heart rate have been elevated for about 2 days following treatments. She denies tingling/numbness. She hasn't taken oxycodone in 2 weeks.  ? ?Overall, she feels ready for next cycle of chemo today.  ? ?REVIEW OF SYSTEMS:  ?Review of Systems  ?Constitutional:  Positive for fatigue. Negative for appetite change.  ?Respiratory:  Positive for shortness of breath.   ?Cardiovascular:  Positive for palpitations.  ?Gastrointestinal:  Positive for diarrhea.   ?Musculoskeletal:  Positive for back pain (2/10).  ?Neurological:  Positive for headaches. Negative for numbness.  ?All other systems reviewed and are negative. ? ?PAST MEDICAL/SURGICAL HISTORY:  ?Past Medical History:  ?Diagnosis Date  ? Arthritis   ? osteoarthritis  ? Back pain   ? Bronchiectasis ([place])   ? Cancer [facility])   ? basal cell nose  ? Chronic cough   ? Complication of anesthesia   ? Dyspnea   ? Gastrointestinal symptoms   ? GERD (gastroesophageal reflux disease)   ? Glaucoma   ? History of hiatal hernia   ? Hyperlipemia   ? Hypertension   ? Multiple myeloma ([place])   ? Osteopenia   ? Pneumonia   ? PONV (postoperative nausea and vomiting)   ? "only one time"  ? Spondylisthesis   ? ?Past Surgical History:  ?Procedure Laterality Date  ? ABDOMINAL EXPOSURE N/A 12/29/2018  ? Procedure: ABDOMINAL EXPOSURE;  Surgeon: Angelia Mould, MD;  Location: [facility];  Service: Vascular;  Laterality: N/A;  ? ABDOMINOPLASTY  2002  ? ABDOMINOPLASTY  1970's  ? ANKLE SURGERY Left 10/2015  ? ANTERIOR LATERAL LUMBAR FUSION WITH PERCUTANEOUS SCREW 3 LEVEL Left 12/29/2018  ? Procedure: Lumbar Two to Lumbar Five Anterolateral lumbar interbody fusion;  Surgeon: Erline Levine, MD;  Location: [facility];  Service: Neurosurgery;  Laterality: Left;  anterolateral  ? ANTERIOR LUMBAR FUSION N/A 12/29/2018  ? Procedure: Lumbar Five Sacral One Anterior lumbar interbody fusion;  Surgeon: Erline Levine, MD;  Location: [facility];  Service: Neurosurgery;  Laterality: N/A;  anterior approach  ? BASAL CELL CARCINOMA EXCISION  06/2018  ? nose  ? BLEPHAROPLASTY Bilateral 1999  ? BREAST ENHANCEMENT SURGERY  1970's  ? COLONOSCOPY    ? CYSTOURETHROSCOPY  2018  ? Doble J ureteal stents  ? DECOMPRESSION CORE HIP Left 2014  ? FACIAL COSMETIC SURGERY  2004  ? FLUOROSCOPY GUIDANCE    ? HIP ARTHROPLASTY Left   ? INCISIONAL HERNIA REPAIR N/A 10/08/2019  ? Procedure: OPEN INCISIONAL HERNIA REPAIR WITH MESH;  Surgeon: Armandina Gemma, MD;  Location: [facility];  Service:  General;  Laterality: N/A;  ? JOINT REPLACEMENT Left   ? Hip; 05/2014  ? KYPHOPLASTY N/A 08/31/2021  ? Procedure: KYPHOPLASTY THORACIC ELEVEN, THORACIC TWELVE;  Surgeon: Dawley, Theodoro Doing, DO;  Location: [facility];  Service: Neurosurgery;  Laterality: N/A;  ? LAPAROSCOPIC PARTIAL COLECTOMY  06/26/2017  ? LUMBAR PERCUTANEOUS PEDICLE SCREW 4 LEVEL N/A 12/29/2018  ? Procedure: Lumbar Percutaneous Pedicle Screw Placement Lumbar two-Sacral one;  Surgeon: Erline Levine, MD;  Location: [facility];  Service: Neurosurgery;  Laterality: N/A;  ? MOHS SURGERY  2019  ? nose  ? PARTIAL COLECTOMY  06/2017  ? for diverticulitis  ? REMOVAL OF BILATERAL TISSUE EXPANDERS WITH PLACEMENT OF BILATERAL BREAST IMPLANTS  2004  ? THIGH LIFT  1994  ? TOE FUSION Right   ? great toe  ? TONSILLECTOMY    ? UMBILICAL HERNIA REPAIR    ? with tummy tuck  ? UMBILICAL HERNIA REPAIR  2002  ? ? ?SOCIAL HISTORY:  ?Social History  ? ?Socioeconomic History  ? Marital status: Married  ?  Spouse name: Not on file  ? Number of children: Not on file  ? Years of education: Not on file  ? Highest education level: Not on file  ?Occupational History  ? Not on file  ?Tobacco Use  ? Smoking status: Never  ? Smokeless tobacco: Never  ?Vaping Use  ? Vaping Use: Never used  ?Substance and Sexual Activity  ? Alcohol use: Yes  ?  Comment: occasional  ? Drug use: Never  ? Sexual activity: Not on file  ?Other Topics Concern  ? Not on file  ?Social History Narrative  ? Not on file  ? ?Social Determinants of Health  ? ?Financial Resource Strain: Low Risk   ? Difficulty of Paying Living Expenses: Not hard at all  ?Food Insecurity: No Food Insecurity  ? Worried About Charity fundraiser in the Last Year: Never true  ? Ran Out of Food in the Last Year: Never true  ?Transportation Needs: No Transportation Needs  ? Lack of Transportation (Medical): No  ? Lack of Transportation (Non-Medical): No  ?Physical Activity: Not on file  ?Stress: Not on file  ?Social Connections: Socially Integrated  ?  Frequency of Communication with Friends and Family: More than three times a week  ? Frequency of Social Gatherings with Friends and Family: More than three times a week  ? Attends Religious Services: 1 to 4 times per year  ? Active Member of Clubs or Organizations: No  ? Attends Archivist Meetings: 1 to 4 times per year  ? Marital Status: Married  ?Intimate Partner Violence: Not on file  ? ? ?FAMILY HISTORY:  ?Family History  ?Problem Relation Age of Onset  ? Hypertension Mother   ? COPD Mother   ? Hypertension Father   ? Heart disease Father   ? ? ?CURRENT MEDICATIONS:  ?Current Outpatient Medications  ?Medication Sig Dispense Refill  ? acyclovir (ZOVIRAX) 400 MG tablet TAKE 1 TABLET BY  MOUTH TWICE A DAY 180 tablet 1  ? albuterol (VENTOLIN HFA) 108 (90 Base) MCG/ACT inhaler Inhale 2 puffs into the lungs every 6 (six) hours as needed for wheezing or shortness of breath.    ? aspirin EC 81 MG tablet Take 81 mg by mouth daily. Swallow whole.    ? azithromycin (ZITHROMAX) 250 MG tablet Take 250 mg by mouth daily.    ? Baclofen 5 MG TABS Take 1 tablet by mouth 3 (three) times daily as needed.    ? Bedaquiline Fumarate (SIRTURO) 100 MG TABS Take 200 mg by mouth every Monday, Wednesday, and Friday.    ? benzonatate (TESSALON) 100 MG capsule Take 100 mg by mouth 3 (three) times daily as needed for cough.    ? Biotin 1000 MCG tablet Take 1,000 mcg by mouth daily.    ? calcitonin, salmon, (MIACALCIN/FORTICAL) 200 UNIT/ACT nasal spray Place 1 spray into alternate nostrils daily.    ? Calcium Carb-Cholecalciferol 600-800 MG-UNIT TABS Take 1 tablet by mouth daily.    ? daratumumab-hyaluronidase-fihj (DARZALEX FASPRO) 1800-30000 MG-UT/15ML SOLN Inject 1,800 mg into the skin once a week.    ? dexamethasone (DECADRON) 2 MG tablet Take 5 tablets (10 mg total) by mouth once a week. 20 tablet 4  ? dextromethorphan-guaiFENesin (MUCINEX DM) 30-600 MG 12hr tablet Take 1 tablet by mouth daily.     ? estrogen,  conjugated,-medroxyprogesterone (PREMPRO) 0.45-1.5 MG tablet Take 1 tablet by mouth daily.    ? feeding supplement (ENSURE ENLIVE / ENSURE PLUS) LIQD Take 237 mLs by mouth 2 (two) times daily between meals. (Patient taking d

## 2021-10-11 NOTE — Progress Notes (Signed)
Maintain dose of Velcade at 1 mg/m2 ? ?T.O. Dr Katratgadda/Kerrie Timm Ronnald Ramp, PharmD ?

## 2021-10-11 NOTE — Progress Notes (Signed)
Patient presents today for [name] and Velcade injections.  Patient will also receive Zometa infusion today.  Patient is in satisfactory condition with only complaints of diarrhea for the last few days.  Vital signs are stable.  Labs reviewed by Dr. Delton Coombes during her office visit.  All labs are within treatment parameters.  We will proceed with treatment per MD orders.  ? ?Patient tolerated Zometa infusion well with no complaints.  ? ?Patient tolerated injections well with no complaints voiced.  Patient left ambulatory with husband in stable condition.  Vital signs stable at discharge.  Follow up as scheduled.    ?

## 2021-10-11 NOTE — Progress Notes (Signed)
Patient is taking Revlimid as prescribed.  She has not missed any doses and reports no side effects at this time.   ? ?Patient has been examined by Dr. Delton Coombes, and vital signs and labs have been reviewed. ANC, Creatinine, LFTs, hemoglobin, and platelets are within treatment parameters per M.D. - pt may proceed with treatment.    ?

## 2021-10-11 NOTE — Patient Instructions (Addendum)
[facility] Discharge Instructions   You were seen and examined today by Dr. Katragadda.  He reviewed your lab work which is normal/stable.   We will proceed with your treatment today.  Return as scheduled.    Thank you for choosing [facility] at [facility] to provide your oncology and hematology care.  To afford each patient quality time with our provider, please arrive at least 15 minutes before your scheduled appointment time.   If you have a lab appointment with the Cancer Center please come in thru the Main Entrance and check in at the main information desk.  You need to re-schedule your appointment should you arrive 10 or more minutes late.  We strive to give you quality time with our providers, and arriving late affects you and other patients whose appointments are after yours.  Also, if you no show three or more times for appointments you may be dismissed from the clinic at the providers discretion.     Again, thank you for choosing [facility].  Our hope is that these requests will decrease the amount of time that you wait before being seen by our physicians.       _____________________________________________________________  Should you have questions after your visit to [facility], please contact our office at [phone] and follow the prompts.  Our office hours are 8:00 a.m. and 4:30 p.m. Monday - Friday.  Please note that voicemails left after 4:00 p.m. may not be returned until the following business day.  We are closed weekends and major holidays.  You do have access to a nurse 24-7, just call the main number to the clinic [phone] and do not press any options, hold on the line and a nurse will answer the phone.    For prescription refill requests, have your pharmacy contact our office and allow 72 hours.    Due to Covid, you will need to wear a mask upon entering the hospital. If  you do not have a mask, a mask will be given to you at the Main Entrance upon arrival. For doctor visits, patients may have 1 support person age 18 or older with them. For treatment visits, patients can not have anyone with them due to social distancing guidelines and our immunocompromised population.      

## 2021-10-12 ENCOUNTER — Other Ambulatory Visit (HOSPITAL_COMMUNITY): Payer: Self-pay

## 2021-10-12 LAB — KAPPA/LAMBDA LIGHT CHAINS
Kappa free light chain: 14.5 mg/L (ref 3.3–19.4)
Kappa, lambda light chain ratio: 2.9 — ABNORMAL HIGH (ref 0.26–1.65)
Lambda free light chains: 5 mg/L — ABNORMAL LOW (ref 5.7–26.3)

## 2021-10-12 MED ORDER — LENALIDOMIDE 15 MG PO CAPS: 15.0000 mg | ORAL_CAPSULE | Freq: Every day | ORAL | 0 refills | Status: DC

## 2021-10-12 NOTE — Telephone Encounter (Signed)
Chart reviewed. Revlimid refilled per last office note with Dr. Katragadda.  

## 2021-10-13 ENCOUNTER — Encounter (HOSPITAL_COMMUNITY): Payer: Self-pay | Admitting: Hematology

## 2021-10-15 LAB — PROTEIN ELECTROPHORESIS, SERUM
A/G Ratio: 1.5 (ref 0.7–1.7)
Albumin ELP: 3.7 g/dL (ref 2.9–4.4)
Alpha-1-Globulin: 0.3 g/dL (ref 0.0–0.4)
Alpha-2-Globulin: 0.7 g/dL (ref 0.4–1.0)
Beta Globulin: 0.9 g/dL (ref 0.7–1.3)
Gamma Globulin: 0.7 g/dL (ref 0.4–1.8)
Globulin, Total: 2.5 g/dL (ref 2.2–3.9)
M-Spike, %: 0.2 g/dL — ABNORMAL HIGH
Total Protein ELP: 6.2 g/dL (ref 6.0–8.5)

## 2021-10-16 ENCOUNTER — Other Ambulatory Visit (HOSPITAL_COMMUNITY): Payer: Self-pay | Admitting: *Deleted

## 2021-10-17 ENCOUNTER — Other Ambulatory Visit (HOSPITAL_COMMUNITY): Payer: Self-pay | Admitting: *Deleted

## 2021-10-17 ENCOUNTER — Encounter (HOSPITAL_COMMUNITY): Payer: Self-pay | Admitting: Hematology

## 2021-10-17 MED ORDER — HYDROCODONE BIT-HOMATROP MBR 5-1.5 MG/5ML PO SOLN: 5.0000 mL | Freq: Four times a day (QID) | ORAL | 0 refills | Status: DC | PRN

## 2021-10-18 ENCOUNTER — Inpatient Hospital Stay (HOSPITAL_COMMUNITY): Payer: Medicare Other

## 2021-10-18 ENCOUNTER — Inpatient Hospital Stay (HOSPITAL_COMMUNITY): Payer: Medicare Other | Attending: Hematology

## 2021-10-18 ENCOUNTER — Encounter (HOSPITAL_COMMUNITY): Payer: Self-pay

## 2021-10-18 VITALS — BP 127/44 | HR 83 | Temp 97.9°F | Resp 18 | Ht 61.0 in | Wt 127.4 lb

## 2021-10-18 DIAGNOSIS — K5900 Constipation, unspecified: Secondary | ICD-10-CM | POA: Insufficient documentation

## 2021-10-18 DIAGNOSIS — C9000 Multiple myeloma not having achieved remission: Secondary | ICD-10-CM

## 2021-10-18 DIAGNOSIS — M545 Low back pain, unspecified: Secondary | ICD-10-CM | POA: Diagnosis not present

## 2021-10-18 DIAGNOSIS — Z85828 Personal history of other malignant neoplasm of skin: Secondary | ICD-10-CM | POA: Insufficient documentation

## 2021-10-18 DIAGNOSIS — Z8651 Personal history of combat and operational stress reaction: Secondary | ICD-10-CM | POA: Diagnosis not present

## 2021-10-18 DIAGNOSIS — R0602 Shortness of breath: Secondary | ICD-10-CM | POA: Insufficient documentation

## 2021-10-18 DIAGNOSIS — R110 Nausea: Secondary | ICD-10-CM | POA: Insufficient documentation

## 2021-10-18 DIAGNOSIS — Z840 Family history of diseases of the skin and subcutaneous tissue: Secondary | ICD-10-CM | POA: Insufficient documentation

## 2021-10-18 DIAGNOSIS — Z5112 Encounter for antineoplastic immunotherapy: Secondary | ICD-10-CM | POA: Diagnosis not present

## 2021-10-18 DIAGNOSIS — R059 Cough, unspecified: Secondary | ICD-10-CM | POA: Diagnosis not present

## 2021-10-18 DIAGNOSIS — R5383 Other fatigue: Secondary | ICD-10-CM | POA: Insufficient documentation

## 2021-10-18 DIAGNOSIS — Z800 Family history of malignant neoplasm of digestive organs: Secondary | ICD-10-CM | POA: Insufficient documentation

## 2021-10-18 LAB — CBC WITH DIFFERENTIAL/PLATELET
Abs Immature Granulocytes: 0.03 10*3/uL (ref 0.00–0.07)
Basophils Absolute: 0.1 10*3/uL (ref 0.0–0.1)
Basophils Relative: 1 %
Eosinophils Absolute: 0.6 10*3/uL — ABNORMAL HIGH (ref 0.0–0.5)
Eosinophils Relative: 6 %
HCT: 37.2 % (ref 36.0–46.0)
Hemoglobin: 11.7 g/dL — ABNORMAL LOW (ref 12.0–15.0)
Immature Granulocytes: 0 %
Lymphocytes Relative: 26 %
Lymphs Abs: 2.3 10*3/uL (ref 0.7–4.0)
MCH: 27.3 pg (ref 26.0–34.0)
MCHC: 31.5 g/dL (ref 30.0–36.0)
MCV: 86.7 fL (ref 80.0–100.0)
Monocytes Absolute: 1 10*3/uL (ref 0.1–1.0)
Monocytes Relative: 12 %
Neutro Abs: 4.8 10*3/uL (ref 1.7–7.7)
Neutrophils Relative %: 55 %
Platelets: 256 10*3/uL (ref 150–400)
RBC: 4.29 MIL/uL (ref 3.87–5.11)
RDW: 16.7 % — ABNORMAL HIGH (ref 11.5–15.5)
WBC: 8.7 10*3/uL (ref 4.0–10.5)
nRBC: 0 % (ref 0.0–0.2)

## 2021-10-18 LAB — COMPREHENSIVE METABOLIC PANEL
ALT: 18 U/L (ref 0–44)
AST: 19 U/L (ref 15–41)
Albumin: 4 g/dL (ref 3.5–5.0)
Alkaline Phosphatase: 62 U/L (ref 38–126)
Anion gap: 11 (ref 5–15)
BUN: 16 mg/dL (ref 8–23)
CO2: 27 mmol/L (ref 22–32)
Calcium: 8.8 mg/dL — ABNORMAL LOW (ref 8.9–10.3)
Chloride: 98 mmol/L (ref 98–111)
Creatinine, Ser: 0.71 mg/dL (ref 0.44–1.00)
GFR, Estimated: >60 mL/min (ref >60)
Glucose, Bld: 121 mg/dL — ABNORMAL HIGH (ref 70–99)
Potassium: 3.7 mmol/L (ref 3.5–5.1)
Sodium: 136 mmol/L (ref 135–145)
Total Bilirubin: 0.8 mg/dL (ref 0.3–1.2)
Total Protein: 7.1 g/dL (ref 6.5–8.1)

## 2021-10-18 LAB — MAGNESIUM: Magnesium: 1.9 mg/dL (ref 1.7–2.4)

## 2021-10-18 MED ORDER — BORTEZOMIB CHEMO SQ INJECTION 3.5 MG (2.5MG/ML)
1.0000 mg/m2 | Freq: Once | INTRAMUSCULAR | Status: AC
  Administered 2021-10-18: 1.75 mg via SUBCUTANEOUS
  Filled 2021-10-18: qty 0.7

## 2021-10-18 MED ORDER — DIPHENHYDRAMINE HCL 25 MG PO CAPS
50.0000 mg | ORAL_CAPSULE | Freq: Once | ORAL | Status: AC
  Administered 2021-10-18: 50 mg via ORAL
  Filled 2021-10-18: qty 2

## 2021-10-18 MED ORDER — ACETAMINOPHEN 325 MG PO TABS
650.0000 mg | ORAL_TABLET | Freq: Once | ORAL | Status: AC
  Administered 2021-10-18: 650 mg via ORAL
  Filled 2021-10-18: qty 2

## 2021-10-18 MED ORDER — DARATUMUMAB-HYALURONIDASE-FIHJ 1800-30000 MG-UT/15ML ~~LOC~~ SOLN
1800.0000 mg | Freq: Once | SUBCUTANEOUS | Status: AC
  Administered 2021-10-18: 1800 mg via SUBCUTANEOUS
  Filled 2021-10-18: qty 15

## 2021-10-18 MED ORDER — DEXAMETHASONE 4 MG PO TABS
20.0000 mg | ORAL_TABLET | Freq: Once | ORAL | Status: AC
  Administered 2021-10-18: 20 mg via ORAL
  Filled 2021-10-18: qty 5

## 2021-10-18 NOTE — Patient Instructions (Signed)
[facility]  Discharge Instructions: ?Thank you for choosing [facility] to provide your oncology and hematology care.  ?If you have a lab appointment with the [facility], please come in thru the Main Entrance and check in at the main information desk. ? ?Wear comfortable clothing and clothing appropriate for easy access to any Portacath or PICC line.  ? ?We strive to give you quality time with your provider. You may need to reschedule your appointment if you arrive late (15 or more minutes).  Arriving late affects you and other patients whose appointments are after yours.  Also, if you miss three or more appointments without notifying the office, you may be dismissed from the clinic at the provider?s discretion.    ?  ?For prescription refill requests, have your pharmacy contact our office and allow 72 hours for refills to be completed.   ? ?Today you received the following chemotherapy and/or immunotherapy agents Daratumumab and Velcade, return as scheduled. ?  ?To help prevent nausea and vomiting after your treatment, we encourage you to take your nausea medication as directed. ? ?BELOW ARE SYMPTOMS THAT SHOULD BE REPORTED IMMEDIATELY: ?*FEVER GREATER THAN 100.4 F (38 ?C) OR HIGHER ?*CHILLS OR SWEATING ?*NAUSEA AND VOMITING THAT IS NOT CONTROLLED WITH YOUR NAUSEA MEDICATION ?*UNUSUAL SHORTNESS OF BREATH ?*UNUSUAL BRUISING OR BLEEDING ?*URINARY PROBLEMS (pain or burning when urinating, or frequent urination) ?*BOWEL PROBLEMS (unusual diarrhea, constipation, pain near the anus) ?TENDERNESS IN MOUTH AND THROAT WITH OR WITHOUT PRESENCE OF ULCERS (sore throat, sores in mouth, or a toothache) ?UNUSUAL RASH, SWELLING OR PAIN  ?UNUSUAL VAGINAL DISCHARGE OR ITCHING  ? ?Items with * indicate a potential emergency and should be followed up as soon as possible or go to the Emergency Department if any problems should occur. ? ?Please show the CHEMOTHERAPY ALERT CARD or IMMUNOTHERAPY ALERT CARD at  check-in to the Emergency Department and triage nurse. ? ?Should you have questions after your visit or need to cancel or reschedule your appointment, please contact [facility] [phone]  and follow the prompts.  Office hours are 8:00 a.m. to 4:30 p.m. Monday - Friday. Please note that voicemails left after 4:00 p.m. may not be returned until the following business day.  We are closed weekends and major holidays. You have access to a nurse at all times for urgent questions. Please call the main number to the clinic [phone] and follow the prompts. ? ?For any non-urgent questions, you may also contact your provider using MyChart. We now offer e-Visits for anyone 73 and older to request care online for non-urgent symptoms. For details visit [email or web address]. ?  ?Also download the MyChart app! Go to the app store, search "MyChart", open the app, select [facility], and log in with your MyChart username and password. ? ?Due to Covid, a mask is required upon entering the hospital/clinic. If you do not have a mask, one will be given to you upon arrival. For doctor visits, patients may have 1 support person aged 23 or older with them. For treatment visits, patients cannot have anyone with them due to current Covid guidelines and our immunocompromised population.  ?

## 2021-10-18 NOTE — Progress Notes (Signed)
Patient tolerated Velcade injection with no complaints voiced. Lab work reviewed. See MAR for details. Injection site clean and dry with no bruising or swelling noted. Patient stable during and after injection.  ?Patient tolerated Daratumumab injection with no complaints voiced. See MAR for details. Lab reviewed. Injection site clean and dry with no bruising or swelling noted at site. Band aid applied. Vss with discharge and left in satisfactory condition with nos/s of distress noted.   ?

## 2021-10-25 ENCOUNTER — Inpatient Hospital Stay (HOSPITAL_BASED_OUTPATIENT_CLINIC_OR_DEPARTMENT_OTHER): Payer: Medicare Other | Admitting: Hematology

## 2021-10-25 ENCOUNTER — Inpatient Hospital Stay (HOSPITAL_COMMUNITY): Payer: Medicare Other

## 2021-10-25 VITALS — BP 125/63 | HR 73 | Temp 97.2°F | Resp 18 | Ht 61.0 in | Wt 128.8 lb

## 2021-10-25 DIAGNOSIS — C9000 Multiple myeloma not having achieved remission: Secondary | ICD-10-CM | POA: Diagnosis not present

## 2021-10-25 DIAGNOSIS — M899 Disorder of bone, unspecified: Secondary | ICD-10-CM | POA: Diagnosis not present

## 2021-10-25 DIAGNOSIS — Z5112 Encounter for antineoplastic immunotherapy: Secondary | ICD-10-CM | POA: Diagnosis not present

## 2021-10-25 LAB — MAGNESIUM: Magnesium: 2.2 mg/dL (ref 1.7–2.4)

## 2021-10-25 LAB — CBC WITH DIFFERENTIAL/PLATELET
Abs Immature Granulocytes: 0.01 10*3/uL (ref 0.00–0.07)
Basophils Absolute: 0 10*3/uL (ref 0.0–0.1)
Basophils Relative: 1 %
Eosinophils Absolute: 0.3 10*3/uL (ref 0.0–0.5)
Eosinophils Relative: 5 %
HCT: 35.2 % — ABNORMAL LOW (ref 36.0–46.0)
Hemoglobin: 11.2 g/dL — ABNORMAL LOW (ref 12.0–15.0)
Immature Granulocytes: 0 %
Lymphocytes Relative: 32 %
Lymphs Abs: 2 10*3/uL (ref 0.7–4.0)
MCH: 27.9 pg (ref 26.0–34.0)
MCHC: 31.8 g/dL (ref 30.0–36.0)
MCV: 87.8 fL (ref 80.0–100.0)
Monocytes Absolute: 0.9 10*3/uL (ref 0.1–1.0)
Monocytes Relative: 15 %
Neutro Abs: 3 10*3/uL (ref 1.7–7.7)
Neutrophils Relative %: 47 %
Platelets: 307 10*3/uL (ref 150–400)
RBC: 4.01 MIL/uL (ref 3.87–5.11)
RDW: 17.5 % — ABNORMAL HIGH (ref 11.5–15.5)
WBC: 6.3 10*3/uL (ref 4.0–10.5)
nRBC: 0 % (ref 0.0–0.2)

## 2021-10-25 LAB — COMPREHENSIVE METABOLIC PANEL
ALT: 17 U/L (ref 0–44)
AST: 20 U/L (ref 15–41)
Albumin: 4 g/dL (ref 3.5–5.0)
Alkaline Phosphatase: 61 U/L (ref 38–126)
Anion gap: 7 (ref 5–15)
BUN: 18 mg/dL (ref 8–23)
CO2: 24 mmol/L (ref 22–32)
Calcium: 8.5 mg/dL — ABNORMAL LOW (ref 8.9–10.3)
Chloride: 107 mmol/L (ref 98–111)
Creatinine, Ser: 0.65 mg/dL (ref 0.44–1.00)
GFR, Estimated: >60 mL/min (ref >60)
Glucose, Bld: 117 mg/dL — ABNORMAL HIGH (ref 70–99)
Potassium: 4.8 mmol/L (ref 3.5–5.1)
Sodium: 138 mmol/L (ref 135–145)
Total Bilirubin: 0.3 mg/dL (ref 0.3–1.2)
Total Protein: 6.6 g/dL (ref 6.5–8.1)

## 2021-10-25 MED ORDER — DIPHENHYDRAMINE HCL 25 MG PO CAPS
50.0000 mg | ORAL_CAPSULE | Freq: Once | ORAL | Status: AC
  Administered 2021-10-25: 50 mg via ORAL
  Filled 2021-10-25: qty 2

## 2021-10-25 MED ORDER — ACETAMINOPHEN 325 MG PO TABS
650.0000 mg | ORAL_TABLET | Freq: Once | ORAL | Status: AC
  Administered 2021-10-25: 650 mg via ORAL
  Filled 2021-10-25: qty 2

## 2021-10-25 MED ORDER — BORTEZOMIB CHEMO SQ INJECTION 3.5 MG (2.5MG/ML)
1.3000 mg/m2 | Freq: Once | INTRAMUSCULAR | Status: AC
  Administered 2021-10-25: 2.25 mg via SUBCUTANEOUS
  Filled 2021-10-25: qty 0.9

## 2021-10-25 MED ORDER — DEXAMETHASONE 4 MG PO TABS
20.0000 mg | ORAL_TABLET | Freq: Once | ORAL | Status: AC
  Administered 2021-10-25: 20 mg via ORAL
  Filled 2021-10-25: qty 5

## 2021-10-25 MED ORDER — DARATUMUMAB-HYALURONIDASE-FIHJ 1800-30000 MG-UT/15ML ~~LOC~~ SOLN
1800.0000 mg | Freq: Once | SUBCUTANEOUS | Status: AC
  Administered 2021-10-25: 1800 mg via SUBCUTANEOUS
  Filled 2021-10-25: qty 15

## 2021-10-25 NOTE — Patient Instructions (Addendum)
[facility] at [facility] ?Discharge Instructions ? ? ?You were seen and examined today by Dr. Delton Coombes. ? ?He reviewed your lab work which is normal/stable.  ? ?We will proceed with your treatment today.  ? ?Return as scheduled in one week.  ? ? ?Thank you for choosing [facility] at [facility] to provide your oncology and hematology care.  To afford each patient quality time with our provider, please arrive at least 15 minutes before your scheduled appointment time.  ? ?If you have a lab appointment with the [facility] please come in thru the Main Entrance and check in at the main information desk. ? ?You need to re-schedule your appointment should you arrive 10 or more minutes late.  We strive to give you quality time with our providers, and arriving late affects you and other patients whose appointments are after yours.  Also, if you no show three or more times for appointments you may be dismissed from the clinic at the providers discretion.     ?Again, thank you for choosing [facility].  Our hope is that these requests will decrease the amount of time that you wait before being seen by our physicians.       ?_____________________________________________________________ ? ?Should you have questions after your visit to [facility], please contact our office at [phone] and follow the prompts.  Our office hours are 8:00 a.m. and 4:30 p.m. Monday - Friday.  Please note that voicemails left after 4:00 p.m. may not be returned until the following business day.  We are closed weekends and major holidays.  You do have access to a nurse 24-7, just call the main number to the clinic [phone] and do not press any options, hold on the line and a nurse will answer the phone.   ? ?For prescription refill requests, have your pharmacy contact our office and allow 72 hours.   ? ?Due to Covid, you will need to wear a mask upon entering the  hospital. If you do not have a mask, a mask will be given to you at the Main Entrance upon arrival. For doctor visits, patients may have 1 support person age 52 or older with them. For treatment visits, patients can not have anyone with them due to social distancing guidelines and our immunocompromised population.  ? ?   ?

## 2021-10-25 NOTE — Progress Notes (Signed)
Patient presents today for Dara  and Velcade injections.  Patient is in satisfactory condition with no new complaints voiced.  Vital signs are stable.  Labs reviewed by Dr. Delton Coombes during her office visit.  All labs are within treatment parameters.  We will proceed with injections per MD orders.  ? ?Patient tolerated injections with no complaints voiced.  Site clean and dry with no bruising or swelling noted.  No complaints of pain.  Discharged with vital signs stable and no signs or symptoms of distress noted.   ?

## 2021-10-25 NOTE — Progress Notes (Signed)
? ?[facility] ?[address] ?[place], [place] [address] ? ? ?CLINIC:  ?Medical Oncology/Hematology ? ?PCP:  ?Hungarland, Jenetta Downer, MD ?[facility] and [facility] /* ?[phone] ? ? ?REASON FOR VISIT:  ?Follow-up for multiple myeloma ? ?PRIOR THERAPY: none ? ?NGS Results: not done ? ?CURRENT THERAPY: DaraVRd (Daratumumab SQ) q21d x 6 Cycles (Induction/Consolidation) ? ?BRIEF ONCOLOGIC HISTORY:  ?Oncology History  ?Multiple myeloma ([place])  ?07/17/2021 Initial Diagnosis  ? Multiple myeloma ([place]) ?  ?07/26/2021 -  Chemotherapy  ? Patient is on Treatment Plan : MYELOMA NEWLY DIAGNOSED TRANSPLANT CANDIDATE DaraVRd (Daratumumab SQ) q21d x 6 Cycles (Induction/Consolidation)  ?   ? ? ?CANCER STAGING: ? Cancer Staging  ?Multiple myeloma ([place]) ?Staging form: Multiple Myeloma, AJCC 6th Edition ?- Clinical stage from 07/17/2021: Stage IA - Unsigned ? ? ?INTERVAL HISTORY:  ?[name], a 71 y.o. female, returns for routine follow-up and consideration for next cycle of chemotherapy. Jerome was last seen on 10/11/2021. ? ?Due for cycle #4 of DaraVRd today.  ? ?Overall, she tells me she has been feeling pretty well. She reports cough, and 1 episode of hemoptysis on 4/11. She continues to have back pain which is worst when standing and walking. She reports she has required oxycodone once since her last visit. She reports fatigue over the past week. She denies tingling/numbness. She is taking Revlimid after lunch or dinner. Her appetite is good. She denies fever. She reports mild ankle swellings after treatment.  ? ?Overall, she feels ready for next cycle of chemo today.  ? ? ?REVIEW OF SYSTEMS:  ?Review of Systems  ?Constitutional:  Positive for fatigue. Negative for appetite change and fever.  ?Respiratory:  Positive for cough and hemoptysis (x1).   ?Cardiovascular:  Positive for leg swelling.  ?Gastrointestinal:  Positive for constipation.  ?Musculoskeletal:  Positive for back  pain (6/10).  ?Neurological:  Negative for numbness.  ?Psychiatric/Behavioral:  Positive for sleep disturbance.   ?All other systems reviewed and are negative. ? ?PAST MEDICAL/SURGICAL HISTORY:  ?Past Medical History:  ?Diagnosis Date  ? Arthritis   ? osteoarthritis  ? Back pain   ? Bronchiectasis ([place])   ? Cancer [facility])   ? basal cell nose  ? Chronic cough   ? Complication of anesthesia   ? Dyspnea   ? Gastrointestinal symptoms   ? GERD (gastroesophageal reflux disease)   ? Glaucoma   ? History of hiatal hernia   ? Hyperlipemia   ? Hypertension   ? Multiple myeloma ([place])   ? Osteopenia   ? Pneumonia   ? PONV (postoperative nausea and vomiting)   ? "only one time"  ? Spondylisthesis   ? ?Past Surgical History:  ?Procedure Laterality Date  ? ABDOMINAL EXPOSURE N/A 12/29/2018  ? Procedure: ABDOMINAL EXPOSURE;  Surgeon: Angelia Mould, MD;  Location: [facility];  Service: Vascular;  Laterality: N/A;  ? ABDOMINOPLASTY  2002  ? ABDOMINOPLASTY  1970's  ? ANKLE SURGERY Left 10/2015  ? ANTERIOR LATERAL LUMBAR FUSION WITH PERCUTANEOUS SCREW 3 LEVEL Left 12/29/2018  ? Procedure: Lumbar Two to Lumbar Five Anterolateral lumbar interbody fusion;  Surgeon: Erline Levine, MD;  Location: [facility];  Service: Neurosurgery;  Laterality: Left;  anterolateral  ? ANTERIOR LUMBAR FUSION N/A 12/29/2018  ? Procedure: Lumbar Five Sacral One Anterior lumbar interbody fusion;  Surgeon: Erline Levine, MD;  Location: [facility];  Service: Neurosurgery;  Laterality: N/A;  anterior approach  ? BASAL CELL CARCINOMA EXCISION  06/2018  ? nose  ? BLEPHAROPLASTY Bilateral 1999  ? BREAST ENHANCEMENT SURGERY  1970's  ? COLONOSCOPY    ? CYSTOURETHROSCOPY  2018  ? Doble J ureteal stents  ? DECOMPRESSION CORE HIP Left 2014  ? FACIAL COSMETIC SURGERY  2004  ? FLUOROSCOPY GUIDANCE    ? HIP ARTHROPLASTY Left   ? INCISIONAL HERNIA REPAIR N/A 10/08/2019  ? Procedure: OPEN INCISIONAL HERNIA REPAIR WITH MESH;  Surgeon: Armandina Gemma, MD;  Location: [facility];  Service:  General;  Laterality: N/A;  ? JOINT REPLACEMENT Left   ? Hip; 05/2014  ? KYPHOPLASTY N/A 08/31/2021  ? Procedure: KYPHOPLASTY THORACIC ELEVEN, THORACIC TWELVE;  Surgeon: Dawley, Theodoro Doing, DO;  Location: [facility];  Service: Neurosurgery;  Laterality: N/A;  ? LAPAROSCOPIC PARTIAL COLECTOMY  06/26/2017  ? LUMBAR PERCUTANEOUS PEDICLE SCREW 4 LEVEL N/A 12/29/2018  ? Procedure: Lumbar Percutaneous Pedicle Screw Placement Lumbar two-Sacral one;  Surgeon: Erline Levine, MD;  Location: [facility];  Service: Neurosurgery;  Laterality: N/A;  ? MOHS SURGERY  2019  ? nose  ? PARTIAL COLECTOMY  06/2017  ? for diverticulitis  ? REMOVAL OF BILATERAL TISSUE EXPANDERS WITH PLACEMENT OF BILATERAL BREAST IMPLANTS  2004  ? THIGH LIFT  1994  ? TOE FUSION Right   ? great toe  ? TONSILLECTOMY    ? UMBILICAL HERNIA REPAIR    ? with tummy tuck  ? UMBILICAL HERNIA REPAIR  2002  ? ? ?SOCIAL HISTORY:  ?Social History  ? ?Socioeconomic History  ? Marital status: Married  ?  Spouse name: Not on file  ? Number of children: Not on file  ? Years of education: Not on file  ? Highest education level: Not on file  ?Occupational History  ? Not on file  ?Tobacco Use  ? Smoking status: Never  ? Smokeless tobacco: Never  ?Vaping Use  ? Vaping Use: Never used  ?Substance and Sexual Activity  ? Alcohol use: Yes  ?  Comment: occasional  ? Drug use: Never  ? Sexual activity: Not on file  ?Other Topics Concern  ? Not on file  ?Social History Narrative  ? Not on file  ? ?Social Determinants of Health  ? ?Financial Resource Strain: Low Risk   ? Difficulty of Paying Living Expenses: Not hard at all  ?Food Insecurity: No Food Insecurity  ? Worried About Charity fundraiser in the Last Year: Never true  ? Ran Out of Food in the Last Year: Never true  ?Transportation Needs: No Transportation Needs  ? Lack of Transportation (Medical): No  ? Lack of Transportation (Non-Medical): No  ?Physical Activity: Not on file  ?Stress: Not on file  ?Social Connections: Socially Integrated  ?  Frequency of Communication with Friends and Family: More than three times a week  ? Frequency of Social Gatherings with Friends and Family: More than three times a week  ? Attends Religious Services: 1 to 4 times per year  ? Active Member of Clubs or Organizations: No  ? Attends Archivist Meetings: 1 to 4 times per year  ? Marital Status: Married  ?Intimate Partner Violence: Not on file  ? ? ?FAMILY HISTORY:  ?Family History  ?Problem Relation Age of Onset  ? Hypertension Mother   ? COPD Mother   ? Hypertension Father   ? Heart disease Father   ? ? ?CURRENT MEDICATIONS:  ?Current Outpatient Medications  ?Medication Sig Dispense Refill  ? albuterol (VENTOLIN HFA) 108 (90 Base) MCG/ACT inhaler Inhale  2 puffs into the lungs every 6 (six) hours as needed for wheezing or shortness of breath.    ? aspirin EC 81 MG tablet Take 81 mg by mouth daily. Swallow whole.    ? azithromycin (ZITHROMAX) 250 MG tablet Take 250 mg by mouth daily.    ? Baclofen 5 MG TABS Take 1 tablet by mouth 3 (three) times daily as needed.    ? Bedaquiline Fumarate (SIRTURO) 100 MG TABS Take 200 mg by mouth every Monday, Wednesday, and Friday.    ? benzonatate (TESSALON) 100 MG capsule Take 100 mg by mouth 3 (three) times daily as needed for cough.    ? Biotin 1000 MCG tablet Take 1,000 mcg by mouth daily.    ? calcitonin, salmon, (MIACALCIN/FORTICAL) 200 UNIT/ACT nasal spray Place 1 spray into alternate nostrils daily.    ? Calcium Carb-Cholecalciferol 600-800 MG-UNIT TABS Take 1 tablet by mouth daily.    ? daratumumab-hyaluronidase-fihj (DARZALEX FASPRO) 1800-30000 MG-UT/15ML SOLN Inject 1,800 mg into the skin once a week.    ? dexamethasone (DECADRON) 2 MG tablet Take 5 tablets (10 mg total) by mouth once a week. 20 tablet 4  ? dextromethorphan-guaiFENesin (MUCINEX DM) 30-600 MG 12hr tablet Take 1 tablet by mouth daily.     ? estrogen, conjugated,-medroxyprogesterone (PREMPRO) 0.45-1.5 MG tablet Take 1 tablet by mouth daily.    ?  feeding supplement (ENSURE ENLIVE / ENSURE PLUS) LIQD Take 237 mLs by mouth 2 (two) times daily between meals. (Patient taking differently: Take 237 mLs by mouth daily.) 237 mL 12  ? fluconazole (DIFLUCAN) 150

## 2021-10-25 NOTE — Progress Notes (Signed)
Patient has been examined by Dr. Katragadda, and vital signs and labs have been reviewed. ANC, Creatinine, LFTs, hemoglobin, and platelets are within treatment parameters per M.D. - pt may proceed with treatment.    °

## 2021-10-25 NOTE — Patient Instructions (Signed)
[facility]  Discharge Instructions: Thank you for choosing [facility] to provide your oncology and hematology care.  If you have a lab appointment with the Cancer Center, please come in thru the Main Entrance and check in at the main information desk.  Wear comfortable clothing and clothing appropriate for easy access to any Portacath or PICC line.   We strive to give you quality time with your provider. You may need to reschedule your appointment if you arrive late (15 or more minutes).  Arriving late affects you and other patients whose appointments are after yours.  Also, if you miss three or more appointments without notifying the office, you may be dismissed from the clinic at the provider's discretion.      For prescription refill requests, have your pharmacy contact our office and allow 72 hours for refills to be completed.        To help prevent nausea and vomiting after your treatment, we encourage you to take your nausea medication as directed.  BELOW ARE SYMPTOMS THAT SHOULD BE REPORTED IMMEDIATELY: *FEVER GREATER THAN 100.4 F (38 C) OR HIGHER *CHILLS OR SWEATING *NAUSEA AND VOMITING THAT IS NOT CONTROLLED WITH YOUR NAUSEA MEDICATION *UNUSUAL SHORTNESS OF BREATH *UNUSUAL BRUISING OR BLEEDING *URINARY PROBLEMS (pain or burning when urinating, or frequent urination) *BOWEL PROBLEMS (unusual diarrhea, constipation, pain near the anus) TENDERNESS IN MOUTH AND THROAT WITH OR WITHOUT PRESENCE OF ULCERS (sore throat, sores in mouth, or a toothache) UNUSUAL RASH, SWELLING OR PAIN  UNUSUAL VAGINAL DISCHARGE OR ITCHING   Items with * indicate a potential emergency and should be followed up as soon as possible or go to the Emergency Department if any problems should occur.  Please show the CHEMOTHERAPY ALERT CARD or IMMUNOTHERAPY ALERT CARD at check-in to the Emergency Department and triage nurse.  Should you have questions after your visit or need to cancel  or reschedule your appointment, please contact [facility] [phone]  and follow the prompts.  Office hours are 8:00 a.m. to 4:30 p.m. Monday - Friday. Please note that voicemails left after 4:00 p.m. may not be returned until the following business day.  We are closed weekends and major holidays. You have access to a nurse at all times for urgent questions. Please call the main number to the clinic [phone] and follow the prompts.  For any non-urgent questions, you may also contact your provider using MyChart. We now offer e-Visits for anyone 18 and older to request care online for non-urgent symptoms. For details visit [email or web address].   Also download the MyChart app! Go to the app store, search "MyChart", open the app, select [facility], and log in with your MyChart username and password.  Due to Covid, a mask is required upon entering the hospital/clinic. If you do not have a mask, one will be given to you upon arrival. For doctor visits, patients may have 1 support person aged 18 or older with them. For treatment visits, patients cannot have anyone with them due to current Covid guidelines and our immunocompromised population.  

## 2021-10-26 ENCOUNTER — Encounter (HOSPITAL_COMMUNITY): Payer: Self-pay | Admitting: Hematology

## 2021-11-01 ENCOUNTER — Encounter (HOSPITAL_COMMUNITY): Payer: Self-pay

## 2021-11-01 ENCOUNTER — Inpatient Hospital Stay (HOSPITAL_COMMUNITY): Payer: Medicare Other | Admitting: Dietician

## 2021-11-01 ENCOUNTER — Inpatient Hospital Stay (HOSPITAL_COMMUNITY): Payer: Medicare Other

## 2021-11-01 VITALS — BP 132/72 | HR 79 | Temp 97.8°F | Resp 18 | Ht 61.0 in | Wt 128.4 lb

## 2021-11-01 DIAGNOSIS — C9000 Multiple myeloma not having achieved remission: Secondary | ICD-10-CM

## 2021-11-01 DIAGNOSIS — Z5112 Encounter for antineoplastic immunotherapy: Secondary | ICD-10-CM | POA: Diagnosis not present

## 2021-11-01 LAB — CBC WITH DIFFERENTIAL/PLATELET
Abs Immature Granulocytes: 0.02 10*3/uL (ref 0.00–0.07)
Basophils Absolute: 0 10*3/uL (ref 0.0–0.1)
Basophils Relative: 0 %
Eosinophils Absolute: 0.4 10*3/uL (ref 0.0–0.5)
Eosinophils Relative: 5 %
HCT: 35.7 % — ABNORMAL LOW (ref 36.0–46.0)
Hemoglobin: 11.4 g/dL — ABNORMAL LOW (ref 12.0–15.0)
Immature Granulocytes: 0 %
Lymphocytes Relative: 30 %
Lymphs Abs: 2 10*3/uL (ref 0.7–4.0)
MCH: 27.7 pg (ref 26.0–34.0)
MCHC: 31.9 g/dL (ref 30.0–36.0)
MCV: 86.9 fL (ref 80.0–100.0)
Monocytes Absolute: 0.5 10*3/uL (ref 0.1–1.0)
Monocytes Relative: 7 %
Neutro Abs: 3.8 10*3/uL (ref 1.7–7.7)
Neutrophils Relative %: 58 %
Platelets: 280 10*3/uL (ref 150–400)
RBC: 4.11 MIL/uL (ref 3.87–5.11)
RDW: 18.2 % — ABNORMAL HIGH (ref 11.5–15.5)
WBC: 6.8 10*3/uL (ref 4.0–10.5)
nRBC: 0 % (ref 0.0–0.2)

## 2021-11-01 LAB — COMPREHENSIVE METABOLIC PANEL
ALT: 17 U/L (ref 0–44)
AST: 18 U/L (ref 15–41)
Albumin: 4 g/dL (ref 3.5–5.0)
Alkaline Phosphatase: 59 U/L (ref 38–126)
Anion gap: 7 (ref 5–15)
BUN: 13 mg/dL (ref 8–23)
CO2: 28 mmol/L (ref 22–32)
Calcium: 9.1 mg/dL (ref 8.9–10.3)
Chloride: 100 mmol/L (ref 98–111)
Creatinine, Ser: 0.67 mg/dL (ref 0.44–1.00)
GFR, Estimated: >60 mL/min (ref >60)
Glucose, Bld: 110 mg/dL — ABNORMAL HIGH (ref 70–99)
Potassium: 4.6 mmol/L (ref 3.5–5.1)
Sodium: 135 mmol/L (ref 135–145)
Total Bilirubin: 0.7 mg/dL (ref 0.3–1.2)
Total Protein: 6.9 g/dL (ref 6.5–8.1)

## 2021-11-01 LAB — MAGNESIUM: Magnesium: 2 mg/dL (ref 1.7–2.4)

## 2021-11-01 MED ORDER — DARATUMUMAB-HYALURONIDASE-FIHJ 1800-30000 MG-UT/15ML ~~LOC~~ SOLN
1800.0000 mg | Freq: Once | SUBCUTANEOUS | Status: AC
  Administered 2021-11-01: 1800 mg via SUBCUTANEOUS
  Filled 2021-11-01: qty 15

## 2021-11-01 MED ORDER — ACETAMINOPHEN 325 MG PO TABS
650.0000 mg | ORAL_TABLET | Freq: Once | ORAL | Status: AC
  Administered 2021-11-01: 650 mg via ORAL
  Filled 2021-11-01: qty 2

## 2021-11-01 MED ORDER — DEXAMETHASONE 4 MG PO TABS
20.0000 mg | ORAL_TABLET | Freq: Once | ORAL | Status: AC
  Administered 2021-11-01: 20 mg via ORAL
  Filled 2021-11-01: qty 5

## 2021-11-01 MED ORDER — DIPHENHYDRAMINE HCL 25 MG PO CAPS
50.0000 mg | ORAL_CAPSULE | Freq: Once | ORAL | Status: AC
  Administered 2021-11-01: 50 mg via ORAL
  Filled 2021-11-01: qty 2

## 2021-11-01 MED ORDER — BORTEZOMIB CHEMO SQ INJECTION 3.5 MG (2.5MG/ML)
1.3000 mg/m2 | Freq: Once | INTRAMUSCULAR | Status: AC
  Administered 2021-11-01: 2.25 mg via SUBCUTANEOUS
  Filled 2021-11-01: qty 0.9

## 2021-11-01 NOTE — Patient Instructions (Signed)
[facility]  Discharge Instructions: ?Thank you for choosing [facility] to provide your oncology and hematology care.  ?If you have a lab appointment with the [facility], please come in thru the Main Entrance and check in at the main information desk. ? ?Wear comfortable clothing and clothing appropriate for easy access to any Portacath or PICC line.  ? ?We strive to give you quality time with your provider. You may need to reschedule your appointment if you arrive late (15 or more minutes).  Arriving late affects you and other patients whose appointments are after yours.  Also, if you miss three or more appointments without notifying the office, you may be dismissed from the clinic at the provider?s discretion.    ?  ?For prescription refill requests, have your pharmacy contact our office and allow 72 hours for refills to be completed.   ? ?Today you received the following chemotherapy and/or immunotherapy agents Darzalex Faspro/Velcade.     ?  ?To help prevent nausea and vomiting after your treatment, we encourage you to take your nausea medication as directed. ? ?BELOW ARE SYMPTOMS THAT SHOULD BE REPORTED IMMEDIATELY: ?*FEVER GREATER THAN 100.4 F (38 ?C) OR HIGHER ?*CHILLS OR SWEATING ?*NAUSEA AND VOMITING THAT IS NOT CONTROLLED WITH YOUR NAUSEA MEDICATION ?*UNUSUAL SHORTNESS OF BREATH ?*UNUSUAL BRUISING OR BLEEDING ?*URINARY PROBLEMS (pain or burning when urinating, or frequent urination) ?*BOWEL PROBLEMS (unusual diarrhea, constipation, pain near the anus) ?TENDERNESS IN MOUTH AND THROAT WITH OR WITHOUT PRESENCE OF ULCERS (sore throat, sores in mouth, or a toothache) ?UNUSUAL RASH, SWELLING OR PAIN  ?UNUSUAL VAGINAL DISCHARGE OR ITCHING  ? ?Items with * indicate a potential emergency and should be followed up as soon as possible or go to the Emergency Department if any problems should occur. ? ?Please show the CHEMOTHERAPY ALERT CARD or IMMUNOTHERAPY ALERT CARD at check-in to the  Emergency Department and triage nurse. ? ?Should you have questions after your visit or need to cancel or reschedule your appointment, please contact [facility] [phone]  and follow the prompts.  Office hours are 8:00 a.m. to 4:30 p.m. Monday - Friday. Please note that voicemails left after 4:00 p.m. may not be returned until the following business day.  We are closed weekends and major holidays. You have access to a nurse at all times for urgent questions. Please call the main number to the clinic [phone] and follow the prompts. ? ?For any non-urgent questions, you may also contact your provider using MyChart. We now offer e-Visits for anyone 13 and older to request care online for non-urgent symptoms. For details visit [email or web address]. ?  ?Also download the MyChart app! Go to the app store, search "MyChart", open the app, select [facility], and log in with your MyChart username and password. ? ?Due to Covid, a mask is required upon entering the hospital/clinic. If you do not have a mask, one will be given to you upon arrival. For doctor visits, patients may have 1 support person aged 61 or older with them. For treatment visits, patients cannot have anyone with them due to current Covid guidelines and our immunocompromised population.  ?

## 2021-11-01 NOTE — Progress Notes (Signed)
Nutrition Follow-up: ? ?Patient with multiple myeloma. She is receiving DaraVRd q21d.  ? ?Met with patient during infusion. She reports a few days of decreased appetite and some diarrhea after last treatment. Patient recalls eating bland foods until diarrhea resolved. She continues to eat 3 meals/day and snacks. Patient is supplementing with 1-2 Ensure. Patient enjoys avocado toast in the mornings. She has been eating a lot of chicken, beef, and salads recently. Patient reports mild swelling in lower extremities. She is taking Lasix as needed. No lower extremity edema present today. Patient denies shortness of breath, nausea, vomiting, constipation. ? ?Medications: hycodan ? ?Labs: glucose 110 ? ?Anthropometrics: Weight 128 lb 12.8 oz today increased  ? ?3/30 - 126 lb 12.2 oz  ?3/23 - 127 lb ?3/16 - 129 lb 6.4 oz ?3/02- 129 lb  ? ? ?NUTRITION DIAGNOSIS: Unintentional weight loss stable  ? ? ?INTERVENTION:  ?Continue eating small meals and snacks with adequate calories and protein ?Continue drinking 1-2 Ensure Plus/equivalent  ?Patient has contact information  ?  ? ?MONITORING, EVALUATION, GOAL: weight trends, intake  ? ? ?NEXT VISIT: To be scheduled as needed  ? ? ? ?

## 2021-11-01 NOTE — Progress Notes (Signed)
Patient presents today for treatment. Vital signs within parameters for today's treatment. Labs within parameters for treatment. Patient has complaints of fatigue and diarrhea related to her last treatment. Patient complains of injection site redness and soreness. Resolved on it's own. Patient states edema noted in ankles with the right ankle being worse and she took Lasix for 3 days. No edema noted today.  ? ?Treatment given today per MD orders. Tolerated without adverse affects. Vital signs stable. No complaints at this time. Discharged from clinic ambulatory in stable condition. Alert and oriented x 3. F/U with [facility] as scheduled.   ?

## 2021-11-06 ENCOUNTER — Other Ambulatory Visit (HOSPITAL_COMMUNITY): Payer: Self-pay

## 2021-11-06 MED ORDER — LENALIDOMIDE 15 MG PO CAPS: 15.0000 mg | ORAL_CAPSULE | Freq: Every day | ORAL | 0 refills | Status: DC

## 2021-11-06 NOTE — Telephone Encounter (Signed)
Chart reviewed. Revlimid refilled per last office note with Dr. Katragadda.  

## 2021-11-08 ENCOUNTER — Inpatient Hospital Stay (HOSPITAL_COMMUNITY): Payer: Medicare Other | Admitting: Hematology

## 2021-11-08 ENCOUNTER — Inpatient Hospital Stay (HOSPITAL_COMMUNITY): Payer: Medicare Other

## 2021-11-08 VITALS — BP 135/70 | HR 69 | Temp 97.7°F | Resp 18 | Ht 61.0 in | Wt 128.8 lb

## 2021-11-08 DIAGNOSIS — C9000 Multiple myeloma not having achieved remission: Secondary | ICD-10-CM

## 2021-11-08 DIAGNOSIS — Z5112 Encounter for antineoplastic immunotherapy: Secondary | ICD-10-CM | POA: Diagnosis not present

## 2021-11-08 LAB — CBC WITH DIFFERENTIAL/PLATELET
Abs Immature Granulocytes: 0.01 10*3/uL (ref 0.00–0.07)
Basophils Absolute: 0 10*3/uL (ref 0.0–0.1)
Basophils Relative: 1 %
Eosinophils Absolute: 0.4 10*3/uL (ref 0.0–0.5)
Eosinophils Relative: 6 %
HCT: 34.2 % — ABNORMAL LOW (ref 36.0–46.0)
Hemoglobin: 10.8 g/dL — ABNORMAL LOW (ref 12.0–15.0)
Immature Granulocytes: 0 %
Lymphocytes Relative: 23 %
Lymphs Abs: 1.6 10*3/uL (ref 0.7–4.0)
MCH: 27.5 pg (ref 26.0–34.0)
MCHC: 31.6 g/dL (ref 30.0–36.0)
MCV: 87 fL (ref 80.0–100.0)
Monocytes Absolute: 1.1 10*3/uL — ABNORMAL HIGH (ref 0.1–1.0)
Monocytes Relative: 16 %
Neutro Abs: 3.7 10*3/uL (ref 1.7–7.7)
Neutrophils Relative %: 54 %
Platelets: 265 10*3/uL (ref 150–400)
RBC: 3.93 MIL/uL (ref 3.87–5.11)
RDW: 17.9 % — ABNORMAL HIGH (ref 11.5–15.5)
WBC: 6.8 10*3/uL (ref 4.0–10.5)
nRBC: 0 % (ref 0.0–0.2)

## 2021-11-08 LAB — COMPREHENSIVE METABOLIC PANEL
ALT: 18 U/L (ref 0–44)
AST: 19 U/L (ref 15–41)
Albumin: 4 g/dL (ref 3.5–5.0)
Alkaline Phosphatase: 62 U/L (ref 38–126)
Anion gap: 7 (ref 5–15)
BUN: 13 mg/dL (ref 8–23)
CO2: 28 mmol/L (ref 22–32)
Calcium: 8.7 mg/dL — ABNORMAL LOW (ref 8.9–10.3)
Chloride: 102 mmol/L (ref 98–111)
Creatinine, Ser: 0.65 mg/dL (ref 0.44–1.00)
GFR, Estimated: >60 mL/min (ref >60)
Glucose, Bld: 93 mg/dL (ref 70–99)
Potassium: 4.1 mmol/L (ref 3.5–5.1)
Sodium: 137 mmol/L (ref 135–145)
Total Bilirubin: 0.7 mg/dL (ref 0.3–1.2)
Total Protein: 6.6 g/dL (ref 6.5–8.1)

## 2021-11-08 LAB — LACTATE DEHYDROGENASE: LDH: 118 U/L (ref 98–192)

## 2021-11-08 LAB — MAGNESIUM: Magnesium: 2.1 mg/dL (ref 1.7–2.4)

## 2021-11-08 MED ORDER — SODIUM CHLORIDE 0.9 % IV SOLN: Freq: Once | INTRAVENOUS | Status: AC

## 2021-11-08 MED ORDER — ACETAMINOPHEN 325 MG PO TABS
650.0000 mg | ORAL_TABLET | Freq: Once | ORAL | Status: AC
  Administered 2021-11-08: 650 mg via ORAL
  Filled 2021-11-08: qty 2

## 2021-11-08 MED ORDER — DARATUMUMAB-HYALURONIDASE-FIHJ 1800-30000 MG-UT/15ML ~~LOC~~ SOLN
1800.0000 mg | Freq: Once | SUBCUTANEOUS | Status: AC
  Administered 2021-11-08: 1800 mg via SUBCUTANEOUS
  Filled 2021-11-08: qty 15

## 2021-11-08 MED ORDER — BORTEZOMIB CHEMO SQ INJECTION 3.5 MG (2.5MG/ML)
1.3000 mg/m2 | Freq: Once | INTRAMUSCULAR | Status: AC
  Administered 2021-11-08: 2.25 mg via SUBCUTANEOUS
  Filled 2021-11-08: qty 0.9

## 2021-11-08 MED ORDER — DIPHENHYDRAMINE HCL 25 MG PO CAPS
50.0000 mg | ORAL_CAPSULE | Freq: Once | ORAL | Status: AC
  Administered 2021-11-08: 50 mg via ORAL
  Filled 2021-11-08: qty 2

## 2021-11-08 MED ORDER — DEXAMETHASONE 4 MG PO TABS
20.0000 mg | ORAL_TABLET | Freq: Once | ORAL | Status: AC
  Administered 2021-11-08: 20 mg via ORAL
  Filled 2021-11-08: qty 5

## 2021-11-08 MED ORDER — ZOLEDRONIC ACID 4 MG/100ML IV SOLN
4.0000 mg | Freq: Once | INTRAVENOUS | Status: AC
  Administered 2021-11-08: 4 mg via INTRAVENOUS
  Filled 2021-11-08: qty 100

## 2021-11-08 NOTE — Progress Notes (Signed)
Patient presents today for chemotherapy injections and Zometa infusion.  Patient is in satisfactory condition with no complaints voiced.  Vital signs are stable.  Labs reviewed and all are within treatment parameters.  We will proceed with treatment per MD orders.  ? ?Patient tolerated treatment and injections well with no complaints voiced.  Patient left via wheelchair with husband in stable condition.  Vital signs stable at discharge.  Follow up as scheduled.    ?

## 2021-11-08 NOTE — Patient Instructions (Signed)
[facility]  Discharge Instructions: Thank you for choosing [facility] to provide your oncology and hematology care.  If you have a lab appointment with the Cancer Center, please come in thru the Main Entrance and check in at the main information desk.  Wear comfortable clothing and clothing appropriate for easy access to any Portacath or PICC line.   We strive to give you quality time with your provider. You may need to reschedule your appointment if you arrive late (15 or more minutes).  Arriving late affects you and other patients whose appointments are after yours.  Also, if you miss three or more appointments without notifying the office, you may be dismissed from the clinic at the provider's discretion.      For prescription refill requests, have your pharmacy contact our office and allow 72 hours for refills to be completed.        To help prevent nausea and vomiting after your treatment, we encourage you to take your nausea medication as directed.  BELOW ARE SYMPTOMS THAT SHOULD BE REPORTED IMMEDIATELY: *FEVER GREATER THAN 100.4 F (38 C) OR HIGHER *CHILLS OR SWEATING *NAUSEA AND VOMITING THAT IS NOT CONTROLLED WITH YOUR NAUSEA MEDICATION *UNUSUAL SHORTNESS OF BREATH *UNUSUAL BRUISING OR BLEEDING *URINARY PROBLEMS (pain or burning when urinating, or frequent urination) *BOWEL PROBLEMS (unusual diarrhea, constipation, pain near the anus) TENDERNESS IN MOUTH AND THROAT WITH OR WITHOUT PRESENCE OF ULCERS (sore throat, sores in mouth, or a toothache) UNUSUAL RASH, SWELLING OR PAIN  UNUSUAL VAGINAL DISCHARGE OR ITCHING   Items with * indicate a potential emergency and should be followed up as soon as possible or go to the Emergency Department if any problems should occur.  Please show the CHEMOTHERAPY ALERT CARD or IMMUNOTHERAPY ALERT CARD at check-in to the Emergency Department and triage nurse.  Should you have questions after your visit or need to cancel  or reschedule your appointment, please contact  CANCER CENTER [phone]  and follow the prompts.  Office hours are 8:00 a.m. to 4:30 p.m. Monday - Friday. Please note that voicemails left after 4:00 p.m. may not be returned until the following business day.  We are closed weekends and major holidays. You have access to a nurse at all times for urgent questions. Please call the main number to the clinic [phone] and follow the prompts.  For any non-urgent questions, you may also contact your provider using MyChart. We now offer e-Visits for anyone 18 and older to request care online for non-urgent symptoms. For details visit [email or web address].   Also download the MyChart app! Go to the app store, search "MyChart", open the app, select , and log in with your MyChart username and password.  Due to Covid, a mask is required upon entering the hospital/clinic. If you do not have a mask, one will be given to you upon arrival. For doctor visits, patients may have 1 support person aged 18 or older with them. For treatment visits, patients cannot have anyone with them due to current Covid guidelines and our immunocompromised population.  

## 2021-11-09 LAB — KAPPA/LAMBDA LIGHT CHAINS
Kappa free light chain: 14 mg/L (ref 3.3–19.4)
Kappa, lambda light chain ratio: 2.41 — ABNORMAL HIGH (ref 0.26–1.65)
Lambda free light chains: 5.8 mg/L (ref 5.7–26.3)

## 2021-11-12 LAB — PROTEIN ELECTROPHORESIS, SERUM
A/G Ratio: 1.4 (ref 0.7–1.7)
Albumin ELP: 3.6 g/dL (ref 2.9–4.4)
Alpha-1-Globulin: 0.3 g/dL (ref 0.0–0.4)
Alpha-2-Globulin: 0.7 g/dL (ref 0.4–1.0)
Beta Globulin: 0.9 g/dL (ref 0.7–1.3)
Gamma Globulin: 0.6 g/dL (ref 0.4–1.8)
Globulin, Total: 2.5 g/dL (ref 2.2–3.9)
M-Spike, %: 0.2 g/dL — ABNORMAL HIGH
Total Protein ELP: 6.1 g/dL (ref 6.0–8.5)

## 2021-11-15 ENCOUNTER — Inpatient Hospital Stay (HOSPITAL_COMMUNITY): Payer: Medicare Other | Attending: Hematology

## 2021-11-15 ENCOUNTER — Inpatient Hospital Stay (HOSPITAL_COMMUNITY): Payer: Medicare Other

## 2021-11-15 ENCOUNTER — Inpatient Hospital Stay (HOSPITAL_BASED_OUTPATIENT_CLINIC_OR_DEPARTMENT_OTHER): Payer: Medicare Other | Admitting: Hematology

## 2021-11-15 VITALS — BP 135/73 | HR 75 | Temp 98.0°F | Resp 18 | Ht 61.89 in | Wt 128.4 lb

## 2021-11-15 DIAGNOSIS — M545 Low back pain, unspecified: Secondary | ICD-10-CM | POA: Insufficient documentation

## 2021-11-15 DIAGNOSIS — M899 Disorder of bone, unspecified: Secondary | ICD-10-CM | POA: Diagnosis not present

## 2021-11-15 DIAGNOSIS — C9000 Multiple myeloma not having achieved remission: Secondary | ICD-10-CM | POA: Diagnosis present

## 2021-11-15 DIAGNOSIS — R059 Cough, unspecified: Secondary | ICD-10-CM | POA: Insufficient documentation

## 2021-11-15 DIAGNOSIS — R5383 Other fatigue: Secondary | ICD-10-CM | POA: Insufficient documentation

## 2021-11-15 DIAGNOSIS — Z8651 Personal history of combat and operational stress reaction: Secondary | ICD-10-CM | POA: Insufficient documentation

## 2021-11-15 DIAGNOSIS — Z5112 Encounter for antineoplastic immunotherapy: Secondary | ICD-10-CM | POA: Insufficient documentation

## 2021-11-15 DIAGNOSIS — K5900 Constipation, unspecified: Secondary | ICD-10-CM | POA: Insufficient documentation

## 2021-11-15 DIAGNOSIS — Z840 Family history of diseases of the skin and subcutaneous tissue: Secondary | ICD-10-CM | POA: Diagnosis not present

## 2021-11-15 DIAGNOSIS — R110 Nausea: Secondary | ICD-10-CM | POA: Diagnosis not present

## 2021-11-15 DIAGNOSIS — Z800 Family history of malignant neoplasm of digestive organs: Secondary | ICD-10-CM | POA: Diagnosis not present

## 2021-11-15 DIAGNOSIS — R051 Acute cough: Secondary | ICD-10-CM | POA: Insufficient documentation

## 2021-11-15 DIAGNOSIS — Z85828 Personal history of other malignant neoplasm of skin: Secondary | ICD-10-CM | POA: Insufficient documentation

## 2021-11-15 LAB — MAGNESIUM: Magnesium: 1.9 mg/dL (ref 1.7–2.4)

## 2021-11-15 LAB — CBC WITH DIFFERENTIAL/PLATELET
Abs Immature Granulocytes: 0.02 10*3/uL (ref 0.00–0.07)
Basophils Absolute: 0.1 10*3/uL (ref 0.0–0.1)
Basophils Relative: 1 %
Eosinophils Absolute: 0.4 10*3/uL (ref 0.0–0.5)
Eosinophils Relative: 6 %
HCT: 35.6 % — ABNORMAL LOW (ref 36.0–46.0)
Hemoglobin: 11.5 g/dL — ABNORMAL LOW (ref 12.0–15.0)
Immature Granulocytes: 0 %
Lymphocytes Relative: 30 %
Lymphs Abs: 2.2 10*3/uL (ref 0.7–4.0)
MCH: 28.1 pg (ref 26.0–34.0)
MCHC: 32.3 g/dL (ref 30.0–36.0)
MCV: 87 fL (ref 80.0–100.0)
Monocytes Absolute: 1 10*3/uL (ref 0.1–1.0)
Monocytes Relative: 14 %
Neutro Abs: 3.7 10*3/uL (ref 1.7–7.7)
Neutrophils Relative %: 49 %
Platelets: 256 10*3/uL (ref 150–400)
RBC: 4.09 MIL/uL (ref 3.87–5.11)
RDW: 18.5 % — ABNORMAL HIGH (ref 11.5–15.5)
WBC: 7.5 10*3/uL (ref 4.0–10.5)
nRBC: 0 % (ref 0.0–0.2)

## 2021-11-15 LAB — COMPREHENSIVE METABOLIC PANEL
ALT: 18 U/L (ref 0–44)
AST: 21 U/L (ref 15–41)
Albumin: 3.9 g/dL (ref 3.5–5.0)
Alkaline Phosphatase: 65 U/L (ref 38–126)
Anion gap: 7 (ref 5–15)
BUN: 19 mg/dL (ref 8–23)
CO2: 28 mmol/L (ref 22–32)
Calcium: 9.1 mg/dL (ref 8.9–10.3)
Chloride: 103 mmol/L (ref 98–111)
Creatinine, Ser: 0.73 mg/dL (ref 0.44–1.00)
GFR, Estimated: >60 mL/min (ref >60)
Glucose, Bld: 118 mg/dL — ABNORMAL HIGH (ref 70–99)
Potassium: 4.6 mmol/L (ref 3.5–5.1)
Sodium: 138 mmol/L (ref 135–145)
Total Bilirubin: 0.7 mg/dL (ref 0.3–1.2)
Total Protein: 6.8 g/dL (ref 6.5–8.1)

## 2021-11-15 MED ORDER — DARATUMUMAB-HYALURONIDASE-FIHJ 1800-30000 MG-UT/15ML ~~LOC~~ SOLN
1800.0000 mg | Freq: Once | SUBCUTANEOUS | Status: AC
  Administered 2021-11-15: 1800 mg via SUBCUTANEOUS
  Filled 2021-11-15: qty 15

## 2021-11-15 MED ORDER — ACETAMINOPHEN 325 MG PO TABS
650.0000 mg | ORAL_TABLET | Freq: Once | ORAL | Status: AC
  Administered 2021-11-15: 650 mg via ORAL
  Filled 2021-11-15: qty 2

## 2021-11-15 MED ORDER — DIPHENHYDRAMINE HCL 25 MG PO CAPS
50.0000 mg | ORAL_CAPSULE | Freq: Once | ORAL | Status: AC
  Administered 2021-11-15: 50 mg via ORAL
  Filled 2021-11-15: qty 2

## 2021-11-15 MED ORDER — BORTEZOMIB CHEMO SQ INJECTION 3.5 MG (2.5MG/ML)
1.3000 mg/m2 | Freq: Once | INTRAMUSCULAR | Status: AC
  Administered 2021-11-15: 2.25 mg via SUBCUTANEOUS
  Filled 2021-11-15: qty 0.9

## 2021-11-15 MED ORDER — DEXAMETHASONE 4 MG PO TABS
20.0000 mg | ORAL_TABLET | Freq: Once | ORAL | Status: AC
  Administered 2021-11-15: 20 mg via ORAL
  Filled 2021-11-15: qty 5

## 2021-11-15 NOTE — Progress Notes (Signed)
Patient is taking Revlimid as prescribed.  She has not missed any doses and reports no side effects at this time.   

## 2021-11-15 NOTE — Progress Notes (Signed)
? ?[facility] ?[address] ?[place], [place] [address] ? ? ?CLINIC:  ?Medical Oncology/Hematology ? ?PCP:  ?Hungarland, Jenetta Downer, MD ?[facility] [place] /* ?[phone] ? ? ?REASON FOR VISIT:  ?Follow-up for multiple myeloma ? ?PRIOR THERAPY: none ? ?NGS Results: not done ? ?CURRENT THERAPY: DaraVRd (Daratumumab SQ) q21d x 6 Cycles (Induction/Consolidation) ? ?BRIEF ONCOLOGIC HISTORY:  ?Oncology History  ?Multiple myeloma ([facility])  ?07/17/2021 Initial Diagnosis  ? Multiple myeloma ([place]) ? ?  ?07/26/2021 -  Chemotherapy  ? Patient is on Treatment Plan : MYELOMA NEWLY DIAGNOSED TRANSPLANT CANDIDATE DaraVRd (Daratumumab SQ) q21d x 6 Cycles (Induction/Consolidation)  ? ?  ?  ? ? ?CANCER STAGING: ? Cancer Staging  ?Multiple myeloma ([place]) ?Staging form: Multiple Myeloma, AJCC 6th Edition ?- Clinical stage from 07/17/2021: Stage IA - Unsigned ? ? ?INTERVAL HISTORY:  ?Ms. TAYLEY MUDRICK, a 71 y.o. female, returns for routine follow-up and consideration for next cycle of chemotherapy. Ashely was last seen on 10/25/2021. ? ?Due for cycle #5 of DaraVRd today.  ? ?Overall, she tells me she has been feeling pretty well. She reports soreness and redness on her abdomen following her last injection. She reports increased coughing and right sided rib pain.  ? ?Overall, she feels ready for next cycle of chemo today.  ? ? ?REVIEW OF SYSTEMS:  ?Review of Systems  ?Constitutional:  Positive for fatigue. Negative for appetite change.  ?Respiratory:  Positive for cough.   ?Cardiovascular:  Positive for chest pain (R sided ribs).  ?Gastrointestinal:  Positive for constipation, diarrhea and nausea.  ?Musculoskeletal:  Positive for back pain (2/10).  ?Neurological:  Positive for headaches.  ?Psychiatric/Behavioral:  Positive for sleep disturbance.   ?All other systems reviewed and are negative. ? ?PAST MEDICAL/SURGICAL HISTORY:  ?Past Medical History:  ?Diagnosis Date  ? Arthritis   ?  osteoarthritis  ? Back pain   ? Bronchiectasis ([place])   ? Cancer [facility])   ? basal cell nose  ? Chronic cough   ? Complication of anesthesia   ? Dyspnea   ? Gastrointestinal symptoms   ? GERD (gastroesophageal reflux disease)   ? Glaucoma   ? History of hiatal hernia   ? Hyperlipemia   ? Hypertension   ? Multiple myeloma ([place])   ? Osteopenia   ? Pneumonia   ? PONV (postoperative nausea and vomiting)   ? "only one time"  ? Spondylisthesis   ? ?Past Surgical History:  ?Procedure Laterality Date  ? ABDOMINAL EXPOSURE N/A 12/29/2018  ? Procedure: ABDOMINAL EXPOSURE;  Surgeon: Angelia Mould, MD;  Location: [facility];  Service: Vascular;  Laterality: N/A;  ? ABDOMINOPLASTY  2002  ? ABDOMINOPLASTY  1970's  ? ANKLE SURGERY Left 10/2015  ? ANTERIOR LATERAL LUMBAR FUSION WITH PERCUTANEOUS SCREW 3 LEVEL Left 12/29/2018  ? Procedure: Lumbar Two to Lumbar Five Anterolateral lumbar interbody fusion;  Surgeon: Erline Levine, MD;  Location: [facility];  Service: Neurosurgery;  Laterality: Left;  anterolateral  ? ANTERIOR LUMBAR FUSION N/A 12/29/2018  ? Procedure: Lumbar Five Sacral One Anterior lumbar interbody fusion;  Surgeon: Erline Levine, MD;  Location: [facility];  Service: Neurosurgery;  Laterality: N/A;  anterior approach  ? BASAL CELL CARCINOMA EXCISION  06/2018  ? nose  ? BLEPHAROPLASTY Bilateral 1999  ? BREAST ENHANCEMENT SURGERY  1970's  ? COLONOSCOPY    ? CYSTOURETHROSCOPY  2018  ? Doble J ureteal stents  ? DECOMPRESSION CORE HIP Left 2014  ?  FACIAL COSMETIC SURGERY  2004  ? FLUOROSCOPY GUIDANCE    ? HIP ARTHROPLASTY Left   ? INCISIONAL HERNIA REPAIR N/A 10/08/2019  ? Procedure: OPEN INCISIONAL HERNIA REPAIR WITH MESH;  Surgeon: Armandina Gemma, MD;  Location: [facility];  Service: General;  Laterality: N/A;  ? JOINT REPLACEMENT Left   ? Hip; 05/2014  ? KYPHOPLASTY N/A 08/31/2021  ? Procedure: KYPHOPLASTY THORACIC ELEVEN, THORACIC TWELVE;  Surgeon: Dawley, Theodoro Doing, DO;  Location: [facility];  Service: Neurosurgery;  Laterality: N/A;  ?  LAPAROSCOPIC PARTIAL COLECTOMY  06/26/2017  ? LUMBAR PERCUTANEOUS PEDICLE SCREW 4 LEVEL N/A 12/29/2018  ? Procedure: Lumbar Percutaneous Pedicle Screw Placement Lumbar two-Sacral one;  Surgeon: Erline Levine, MD;  Location: [facility];  Service: Neurosurgery;  Laterality: N/A;  ? MOHS SURGERY  2019  ? nose  ? PARTIAL COLECTOMY  06/2017  ? for diverticulitis  ? REMOVAL OF BILATERAL TISSUE EXPANDERS WITH PLACEMENT OF BILATERAL BREAST IMPLANTS  2004  ? THIGH LIFT  1994  ? TOE FUSION Right   ? great toe  ? TONSILLECTOMY    ? UMBILICAL HERNIA REPAIR    ? with tummy tuck  ? UMBILICAL HERNIA REPAIR  2002  ? ? ?SOCIAL HISTORY:  ?Social History  ? ?Socioeconomic History  ? Marital status: Married  ?  Spouse name: Not on file  ? Number of children: Not on file  ? Years of education: Not on file  ? Highest education level: Not on file  ?Occupational History  ? Not on file  ?Tobacco Use  ? Smoking status: Never  ? Smokeless tobacco: Never  ?Vaping Use  ? Vaping Use: Never used  ?Substance and Sexual Activity  ? Alcohol use: Yes  ?  Comment: occasional  ? Drug use: Never  ? Sexual activity: Not on file  ?Other Topics Concern  ? Not on file  ?Social History Narrative  ? Not on file  ? ?Social Determinants of Health  ? ?Financial Resource Strain: Low Risk   ? Difficulty of Paying Living Expenses: Not hard at all  ?Food Insecurity: No Food Insecurity  ? Worried About Charity fundraiser in the Last Year: Never true  ? Ran Out of Food in the Last Year: Never true  ?Transportation Needs: No Transportation Needs  ? Lack of Transportation (Medical): No  ? Lack of Transportation (Non-Medical): No  ?Physical Activity: Not on file  ?Stress: Not on file  ?Social Connections: Socially Integrated  ? Frequency of Communication with Friends and Family: More than three times a week  ? Frequency of Social Gatherings with Friends and Family: More than three times a week  ? Attends Religious Services: 1 to 4 times per year  ? Active Member of Clubs or  Organizations: No  ? Attends Archivist Meetings: 1 to 4 times per year  ? Marital Status: Married  ?Intimate Partner Violence: Not on file  ? ? ?FAMILY HISTORY:  ?Family History  ?Problem Relation Age of Onset  ? Hypertension Mother   ? COPD Mother   ? Hypertension Father   ? Heart disease Father   ? ? ?CURRENT MEDICATIONS:  ?Current Outpatient Medications  ?Medication Sig Dispense Refill  ? albuterol (VENTOLIN HFA) 108 (90 Base) MCG/ACT inhaler Inhale 2 puffs into the lungs every 6 (six) hours as needed for wheezing or shortness of breath.    ? aspirin EC 81 MG tablet Take 81 mg by mouth daily. Swallow whole.    ? azithromycin (ZITHROMAX) 250  MG tablet Take 250 mg by mouth daily.    ? Baclofen 5 MG TABS Take 1 tablet by mouth 3 (three) times daily as needed.    ? Bedaquiline Fumarate (SIRTURO) 100 MG TABS Take 200 mg by mouth every Monday, Wednesday, and Friday.    ? benzonatate (TESSALON) 100 MG capsule Take 100 mg by mouth 3 (three) times daily as needed for cough.    ? Biotin 1000 MCG tablet Take 1,000 mcg by mouth daily.    ? calcitonin, salmon, (MIACALCIN/FORTICAL) 200 UNIT/ACT nasal spray Place 1 spray into alternate nostrils daily.    ? Calcium Carb-Cholecalciferol 600-800 MG-UNIT TABS Take 1 tablet by mouth daily.    ? daratumumab-hyaluronidase-fihj (DARZALEX FASPRO) 1800-30000 MG-UT/15ML SOLN Inject 1,800 mg into the skin once a week.    ? dexamethasone (DECADRON) 2 MG tablet Take 5 tablets (10 mg total) by mouth once a week. 20 tablet 4  ? dextromethorphan-guaiFENesin (MUCINEX DM) 30-600 MG 12hr tablet Take 1 tablet by mouth daily.     ? estrogen, conjugated,-medroxyprogesterone (PREMPRO) 0.45-1.5 MG tablet Take 1 tablet by mouth daily.    ? feeding supplement (ENSURE ENLIVE / ENSURE PLUS) LIQD Take 237 mLs by mouth 2 (two) times daily between meals. (Patient taking differently: Take 237 mLs by mouth daily.) 237 mL 12  ? fluconazole (DIFLUCAN) 150 MG tablet Take by mouth.    ? fluticasone  (FLONASE) 50 MCG/ACT nasal spray Place 1 spray into both nostrils daily.    ? furosemide (LASIX) 20 MG tablet Take 1 tablet (20 mg total) by mouth daily as needed. 30 tablet 2  ? hydrochlorothiazide (HYDRODIURIL)

## 2021-11-15 NOTE — Progress Notes (Signed)
Patient has been examined by Dr. Katragadda, and vital signs and labs have been reviewed. ANC, Creatinine, LFTs, hemoglobin, and platelets are within treatment parameters per M.D. - pt may proceed with treatment.    °

## 2021-11-15 NOTE — Progress Notes (Signed)
Patient presents today for Daratumumab and Velcade injections per providers order.  Labs and Vital signs reviewed by MD. ? ?Message received from Anastasio Champion RN/Dr. Delton Coombes patient okay for treatment. ? ?Stable during administration without incident; injection site WNL; see MAR for injection details.  Patient tolerated procedure well and without incident.  No questions or complaints noted at this time.  Discharge from clinic via wheelchair in stable condition.  Alert and oriented X 3.  Follow up with [facility] as scheduled.  ?

## 2021-11-15 NOTE — Patient Instructions (Signed)
[facility]  Discharge Instructions: ?Thank you for choosing [facility] to provide your oncology and hematology care.  ?If you have a lab appointment with the [facility], please come in thru the Main Entrance and check in at the main information desk. ? ?Wear comfortable clothing and clothing appropriate for easy access to any Portacath or PICC line.  ? ?We strive to give you quality time with your provider. You may need to reschedule your appointment if you arrive late (15 or more minutes).  Arriving late affects you and other patients whose appointments are after yours.  Also, if you miss three or more appointments without notifying the office, you may be dismissed from the clinic at the provider?s discretion.    ?  ?For prescription refill requests, have your pharmacy contact our office and allow 72 hours for refills to be completed.   ? ?Today you received the following chemotherapy and/or immunotherapy agents Daratumumab/Velcade    ?  ?To help prevent nausea and vomiting after your treatment, we encourage you to take your nausea medication as directed. ? ?BELOW ARE SYMPTOMS THAT SHOULD BE REPORTED IMMEDIATELY: ?*FEVER GREATER THAN 100.4 F (38 ?C) OR HIGHER ?*CHILLS OR SWEATING ?*NAUSEA AND VOMITING THAT IS NOT CONTROLLED WITH YOUR NAUSEA MEDICATION ?*UNUSUAL SHORTNESS OF BREATH ?*UNUSUAL BRUISING OR BLEEDING ?*URINARY PROBLEMS (pain or burning when urinating, or frequent urination) ?*BOWEL PROBLEMS (unusual diarrhea, constipation, pain near the anus) ?TENDERNESS IN MOUTH AND THROAT WITH OR WITHOUT PRESENCE OF ULCERS (sore throat, sores in mouth, or a toothache) ?UNUSUAL RASH, SWELLING OR PAIN  ?UNUSUAL VAGINAL DISCHARGE OR ITCHING  ? ?Items with * indicate a potential emergency and should be followed up as soon as possible or go to the Emergency Department if any problems should occur. ? ?Please show the CHEMOTHERAPY ALERT CARD or IMMUNOTHERAPY ALERT CARD at check-in to the  Emergency Department and triage nurse. ? ?Should you have questions after your visit or need to cancel or reschedule your appointment, please contact [facility] [phone]  and follow the prompts.  Office hours are 8:00 a.m. to 4:30 p.m. Monday - Friday. Please note that voicemails left after 4:00 p.m. may not be returned until the following business day.  We are closed weekends and major holidays. You have access to a nurse at all times for urgent questions. Please call the main number to the clinic [phone] and follow the prompts. ? ?For any non-urgent questions, you may also contact your provider using MyChart. We now offer e-Visits for anyone 7 and older to request care online for non-urgent symptoms. For details visit [email or web address]. ?  ?Also download the MyChart app! Go to the app store, search "MyChart", open the app, select [facility], and log in with your MyChart username and password. ? ?Due to Covid, a mask is required upon entering the hospital/clinic. If you do not have a mask, one will be given to you upon arrival. For doctor visits, patients may have 1 support person aged 20 or older with them. For treatment visits, patients cannot have anyone with them due to current Covid guidelines and our immunocompromised population.  ?

## 2021-11-22 ENCOUNTER — Inpatient Hospital Stay (HOSPITAL_COMMUNITY): Payer: Medicare Other

## 2021-11-22 ENCOUNTER — Other Ambulatory Visit (HOSPITAL_COMMUNITY): Payer: Self-pay

## 2021-11-22 VITALS — BP 152/78 | HR 70 | Temp 97.2°F | Resp 18

## 2021-11-22 DIAGNOSIS — C9000 Multiple myeloma not having achieved remission: Secondary | ICD-10-CM

## 2021-11-22 DIAGNOSIS — Z5112 Encounter for antineoplastic immunotherapy: Secondary | ICD-10-CM | POA: Diagnosis not present

## 2021-11-22 LAB — CBC WITH DIFFERENTIAL/PLATELET
Abs Immature Granulocytes: 0.02 10*3/uL (ref 0.00–0.07)
Basophils Absolute: 0.1 10*3/uL (ref 0.0–0.1)
Basophils Relative: 1 %
Eosinophils Absolute: 0.9 10*3/uL — ABNORMAL HIGH (ref 0.0–0.5)
Eosinophils Relative: 12 %
HCT: 32.5 % — ABNORMAL LOW (ref 36.0–46.0)
Hemoglobin: 10.4 g/dL — ABNORMAL LOW (ref 12.0–15.0)
Immature Granulocytes: 0 %
Lymphocytes Relative: 19 %
Lymphs Abs: 1.4 10*3/uL (ref 0.7–4.0)
MCH: 27.8 pg (ref 26.0–34.0)
MCHC: 32 g/dL (ref 30.0–36.0)
MCV: 86.9 fL (ref 80.0–100.0)
Monocytes Absolute: 0.4 10*3/uL (ref 0.1–1.0)
Monocytes Relative: 6 %
Neutro Abs: 4.6 10*3/uL (ref 1.7–7.7)
Neutrophils Relative %: 62 %
Platelets: 266 10*3/uL (ref 150–400)
RBC: 3.74 MIL/uL — ABNORMAL LOW (ref 3.87–5.11)
RDW: 18.8 % — ABNORMAL HIGH (ref 11.5–15.5)
WBC: 7.4 10*3/uL (ref 4.0–10.5)
nRBC: 0 % (ref 0.0–0.2)

## 2021-11-22 LAB — COMPREHENSIVE METABOLIC PANEL
ALT: 18 U/L (ref 0–44)
AST: 20 U/L (ref 15–41)
Albumin: 3.9 g/dL (ref 3.5–5.0)
Alkaline Phosphatase: 64 U/L (ref 38–126)
Anion gap: 6 (ref 5–15)
BUN: 12 mg/dL (ref 8–23)
CO2: 27 mmol/L (ref 22–32)
Calcium: 8.7 mg/dL — ABNORMAL LOW (ref 8.9–10.3)
Chloride: 101 mmol/L (ref 98–111)
Creatinine, Ser: 0.68 mg/dL (ref 0.44–1.00)
GFR, Estimated: >60 mL/min (ref >60)
Glucose, Bld: 97 mg/dL (ref 70–99)
Potassium: 4.3 mmol/L (ref 3.5–5.1)
Sodium: 134 mmol/L — ABNORMAL LOW (ref 135–145)
Total Bilirubin: 0.5 mg/dL (ref 0.3–1.2)
Total Protein: 6.5 g/dL (ref 6.5–8.1)

## 2021-11-22 LAB — LACTATE DEHYDROGENASE: LDH: 136 U/L (ref 98–192)

## 2021-11-22 LAB — MAGNESIUM: Magnesium: 2 mg/dL (ref 1.7–2.4)

## 2021-11-22 MED ORDER — LENALIDOMIDE 20 MG PO CAPS: 20.0000 mg | ORAL_CAPSULE | Freq: Every day | ORAL | 0 refills | Status: DC

## 2021-11-22 MED ORDER — PROCHLORPERAZINE MALEATE 10 MG PO TABS
10.0000 mg | ORAL_TABLET | Freq: Once | ORAL | Status: AC
  Administered 2021-11-22: 10 mg via ORAL
  Filled 2021-11-22: qty 1

## 2021-11-22 MED ORDER — BORTEZOMIB CHEMO SQ INJECTION 3.5 MG (2.5MG/ML)
1.3000 mg/m2 | Freq: Once | INTRAMUSCULAR | Status: AC
  Administered 2021-11-22: 2.25 mg via SUBCUTANEOUS
  Filled 2021-11-22: qty 0.9

## 2021-11-22 NOTE — Patient Instructions (Signed)
[facility]  Discharge Instructions: Thank you for choosing [facility] to provide your oncology and hematology care.  If you have a lab appointment with the Cancer Center, please come in thru the Main Entrance and check in at the main information desk.  Wear comfortable clothing and clothing appropriate for easy access to any Portacath or PICC line.   We strive to give you quality time with your provider. You may need to reschedule your appointment if you arrive late (15 or more minutes).  Arriving late affects you and other patients whose appointments are after yours.  Also, if you miss three or more appointments without notifying the office, you may be dismissed from the clinic at the provider's discretion.      For prescription refill requests, have your pharmacy contact our office and allow 72 hours for refills to be completed.        To help prevent nausea and vomiting after your treatment, we encourage you to take your nausea medication as directed.  BELOW ARE SYMPTOMS THAT SHOULD BE REPORTED IMMEDIATELY: *FEVER GREATER THAN 100.4 F (38 C) OR HIGHER *CHILLS OR SWEATING *NAUSEA AND VOMITING THAT IS NOT CONTROLLED WITH YOUR NAUSEA MEDICATION *UNUSUAL SHORTNESS OF BREATH *UNUSUAL BRUISING OR BLEEDING *URINARY PROBLEMS (pain or burning when urinating, or frequent urination) *BOWEL PROBLEMS (unusual diarrhea, constipation, pain near the anus) TENDERNESS IN MOUTH AND THROAT WITH OR WITHOUT PRESENCE OF ULCERS (sore throat, sores in mouth, or a toothache) UNUSUAL RASH, SWELLING OR PAIN  UNUSUAL VAGINAL DISCHARGE OR ITCHING   Items with * indicate a potential emergency and should be followed up as soon as possible or go to the Emergency Department if any problems should occur.  Please show the CHEMOTHERAPY ALERT CARD or IMMUNOTHERAPY ALERT CARD at check-in to the Emergency Department and triage nurse.  Should you have questions after your visit or need to cancel  or reschedule your appointment, please contact [facility] [phone]  and follow the prompts.  Office hours are 8:00 a.m. to 4:30 p.m. Monday - Friday. Please note that voicemails left after 4:00 p.m. may not be returned until the following business day.  We are closed weekends and major holidays. You have access to a nurse at all times for urgent questions. Please call the main number to the clinic [phone] and follow the prompts.  For any non-urgent questions, you may also contact your provider using MyChart. We now offer e-Visits for anyone 18 and older to request care online for non-urgent symptoms. For details visit [email or web address].   Also download the MyChart app! Go to the app store, search "MyChart", open the app, select [facility], and log in with your MyChart username and password.  Due to Covid, a mask is required upon entering the hospital/clinic. If you do not have a mask, one will be given to you upon arrival. For doctor visits, patients may have 1 support person aged 18 or older with them. For treatment visits, patients cannot have anyone with them due to current Covid guidelines and our immunocompromised population.  

## 2021-11-22 NOTE — Telephone Encounter (Signed)
Chart reviewed. Revlimid refilled per last office note with Dr. Katragadda.  

## 2021-11-22 NOTE — Progress Notes (Signed)
Patient presents today for Velcade injection.  Patient is in satisfactory condition with no complaints voiced.  Vital signs are stable.  Labs reviewed and all are within treatment parameters.  We will proceed with injection per MD orders.  ? ?Patient tolerated injection with no complaints voiced.  Site clean and dry with no bruising or swelling noted.  No complaints of pain.  Discharged with vital signs stable and no signs or symptoms of distress noted.   ?

## 2021-11-23 ENCOUNTER — Inpatient Hospital Stay (HOSPITAL_BASED_OUTPATIENT_CLINIC_OR_DEPARTMENT_OTHER): Payer: Medicare Other | Admitting: Physician Assistant

## 2021-11-23 ENCOUNTER — Other Ambulatory Visit (HOSPITAL_COMMUNITY): Payer: Self-pay | Admitting: *Deleted

## 2021-11-23 ENCOUNTER — Inpatient Hospital Stay (HOSPITAL_COMMUNITY): Payer: Medicare Other

## 2021-11-23 ENCOUNTER — Ambulatory Visit (HOSPITAL_COMMUNITY)
Admission: RE | Admit: 2021-11-23 | Discharge: 2021-11-23 | Disposition: A | Discharge status: Home / Self Care | Payer: Medicare Other | Source: Ambulatory Visit | Attending: Physician Assistant | Admitting: Physician Assistant

## 2021-11-23 VITALS — BP 130/75 | HR 78 | Temp 98.7°F | Resp 18 | Ht 61.0 in | Wt 120.8 lb

## 2021-11-23 DIAGNOSIS — R051 Acute cough: Secondary | ICD-10-CM

## 2021-11-23 DIAGNOSIS — R5081 Fever presenting with conditions classified elsewhere: Secondary | ICD-10-CM | POA: Diagnosis not present

## 2021-11-23 DIAGNOSIS — A318 Other mycobacterial infections: Secondary | ICD-10-CM | POA: Diagnosis not present

## 2021-11-23 DIAGNOSIS — Z5112 Encounter for antineoplastic immunotherapy: Secondary | ICD-10-CM | POA: Diagnosis not present

## 2021-11-23 LAB — CBC WITH DIFFERENTIAL/PLATELET
Abs Immature Granulocytes: 0.03 10*3/uL (ref 0.00–0.07)
Basophils Absolute: 0 10*3/uL (ref 0.0–0.1)
Basophils Relative: 0 %
Eosinophils Absolute: 0.6 10*3/uL — ABNORMAL HIGH (ref 0.0–0.5)
Eosinophils Relative: 7 %
HCT: 33.4 % — ABNORMAL LOW (ref 36.0–46.0)
Hemoglobin: 10.7 g/dL — ABNORMAL LOW (ref 12.0–15.0)
Immature Granulocytes: 0 %
Lymphocytes Relative: 14 %
Lymphs Abs: 1.3 10*3/uL (ref 0.7–4.0)
MCH: 27.8 pg (ref 26.0–34.0)
MCHC: 32 g/dL (ref 30.0–36.0)
MCV: 86.8 fL (ref 80.0–100.0)
Monocytes Absolute: 0.4 10*3/uL (ref 0.1–1.0)
Monocytes Relative: 5 %
Neutro Abs: 6.8 10*3/uL (ref 1.7–7.7)
Neutrophils Relative %: 74 %
Platelets: 260 10*3/uL (ref 150–400)
RBC: 3.85 MIL/uL — ABNORMAL LOW (ref 3.87–5.11)
RDW: 18.9 % — ABNORMAL HIGH (ref 11.5–15.5)
WBC: 9.3 10*3/uL (ref 4.0–10.5)
nRBC: 0 % (ref 0.0–0.2)

## 2021-11-23 LAB — COMPREHENSIVE METABOLIC PANEL
ALT: 19 U/L (ref 0–44)
AST: 19 U/L (ref 15–41)
Albumin: 3.9 g/dL (ref 3.5–5.0)
Alkaline Phosphatase: 66 U/L (ref 38–126)
Anion gap: 8 (ref 5–15)
BUN: 10 mg/dL (ref 8–23)
CO2: 26 mmol/L (ref 22–32)
Calcium: 8.3 mg/dL — ABNORMAL LOW (ref 8.9–10.3)
Chloride: 102 mmol/L (ref 98–111)
Creatinine, Ser: 0.7 mg/dL (ref 0.44–1.00)
GFR, Estimated: >60 mL/min (ref >60)
Glucose, Bld: 91 mg/dL (ref 70–99)
Potassium: 4 mmol/L (ref 3.5–5.1)
Sodium: 136 mmol/L (ref 135–145)
Total Bilirubin: 0.4 mg/dL (ref 0.3–1.2)
Total Protein: 6.4 g/dL — ABNORMAL LOW (ref 6.5–8.1)

## 2021-11-23 LAB — PROCALCITONIN: Procalcitonin: <0.1 ng/mL

## 2021-11-23 LAB — LACTIC ACID, PLASMA: Lactic Acid, Venous: 0.9 mmol/L (ref 0.5–1.9)

## 2021-11-23 LAB — KAPPA/LAMBDA LIGHT CHAINS
Kappa free light chain: 12.4 mg/L (ref 3.3–19.4)
Kappa, lambda light chain ratio: 2.7 — ABNORMAL HIGH (ref 0.26–1.65)
Lambda free light chains: 4.6 mg/L — ABNORMAL LOW (ref 5.7–26.3)

## 2021-11-23 IMAGING — DX DG CHEST 2V
2 series · 2 of 2 positions shown · non-contrast
Comparison: [DATE]

CLINICAL DATA: Acute productive cough

EXAM:
CHEST - 2 VIEW

[chest pa]
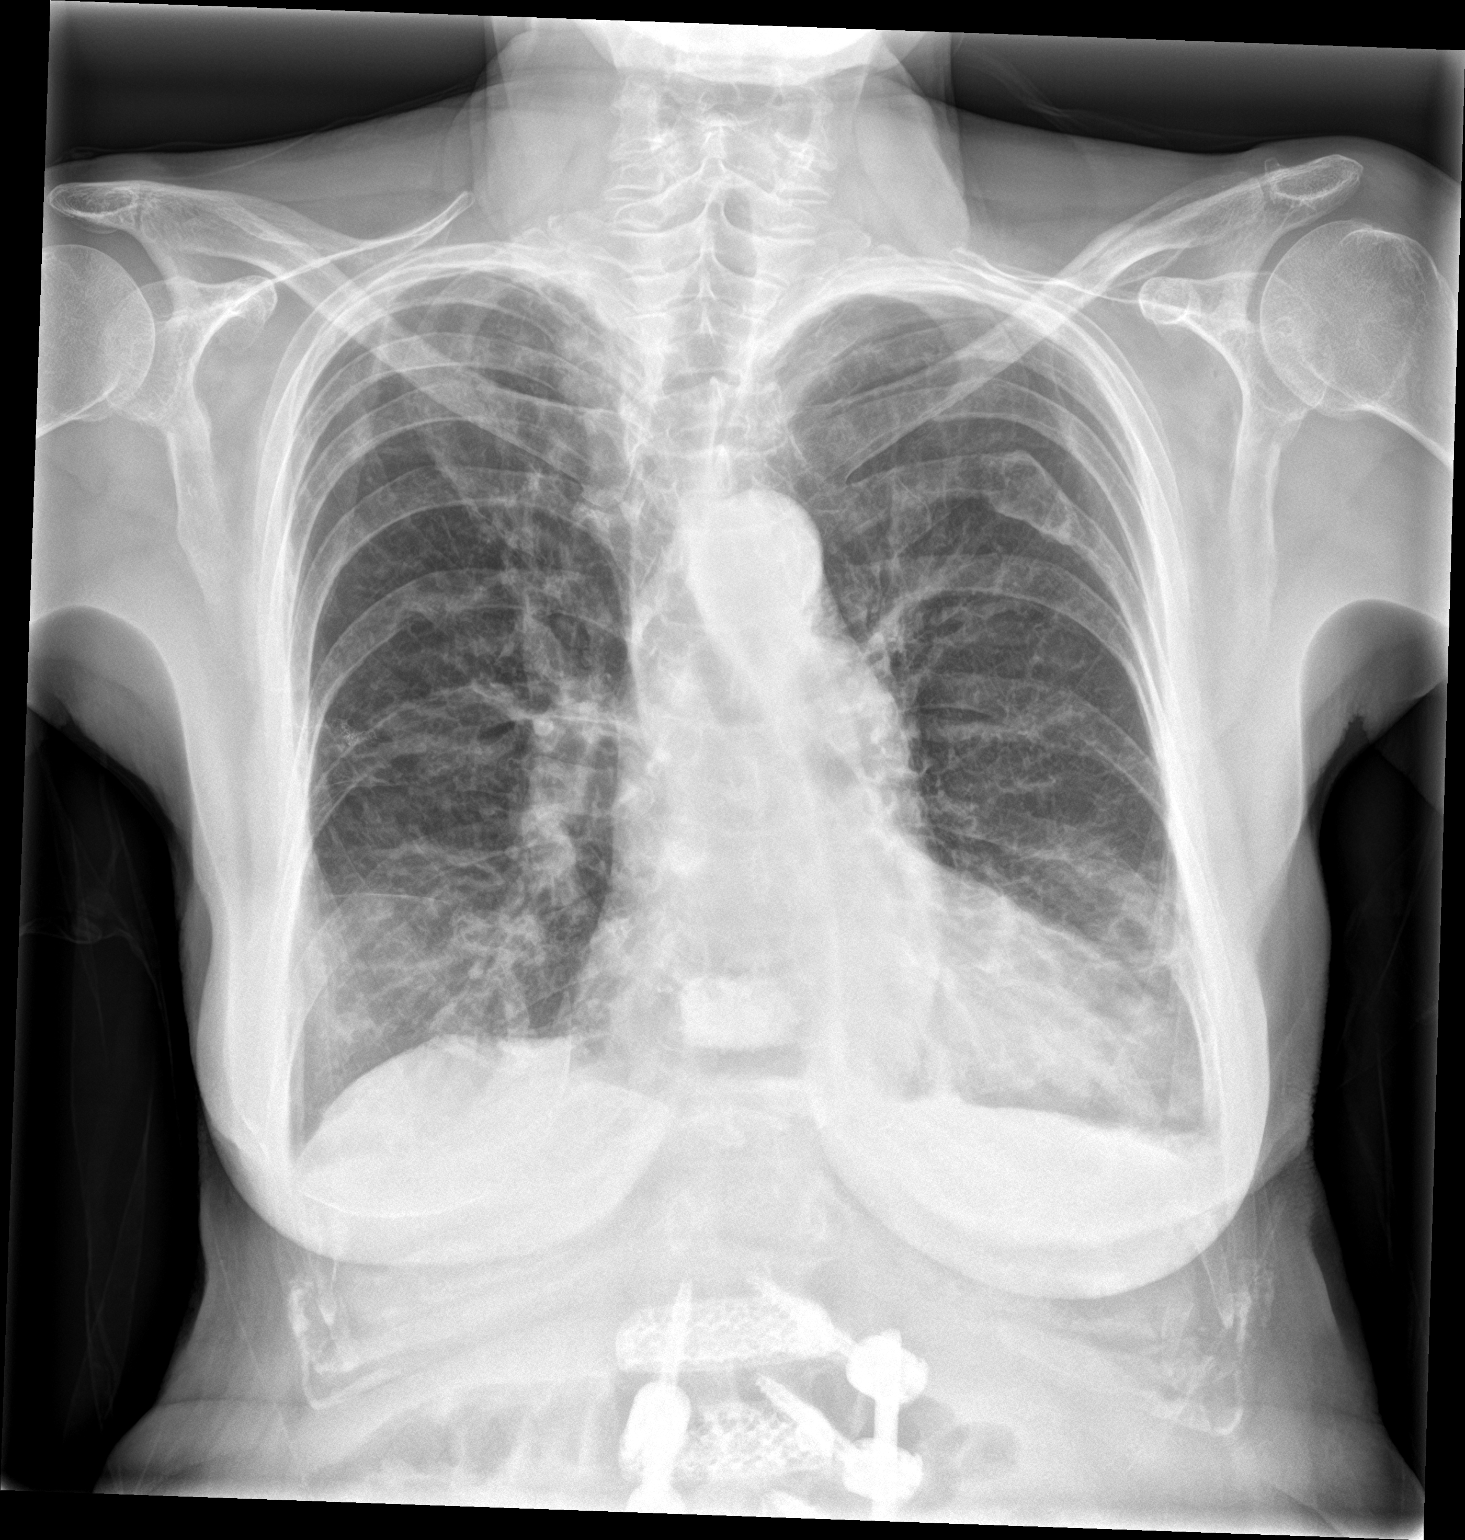

[chest lat]
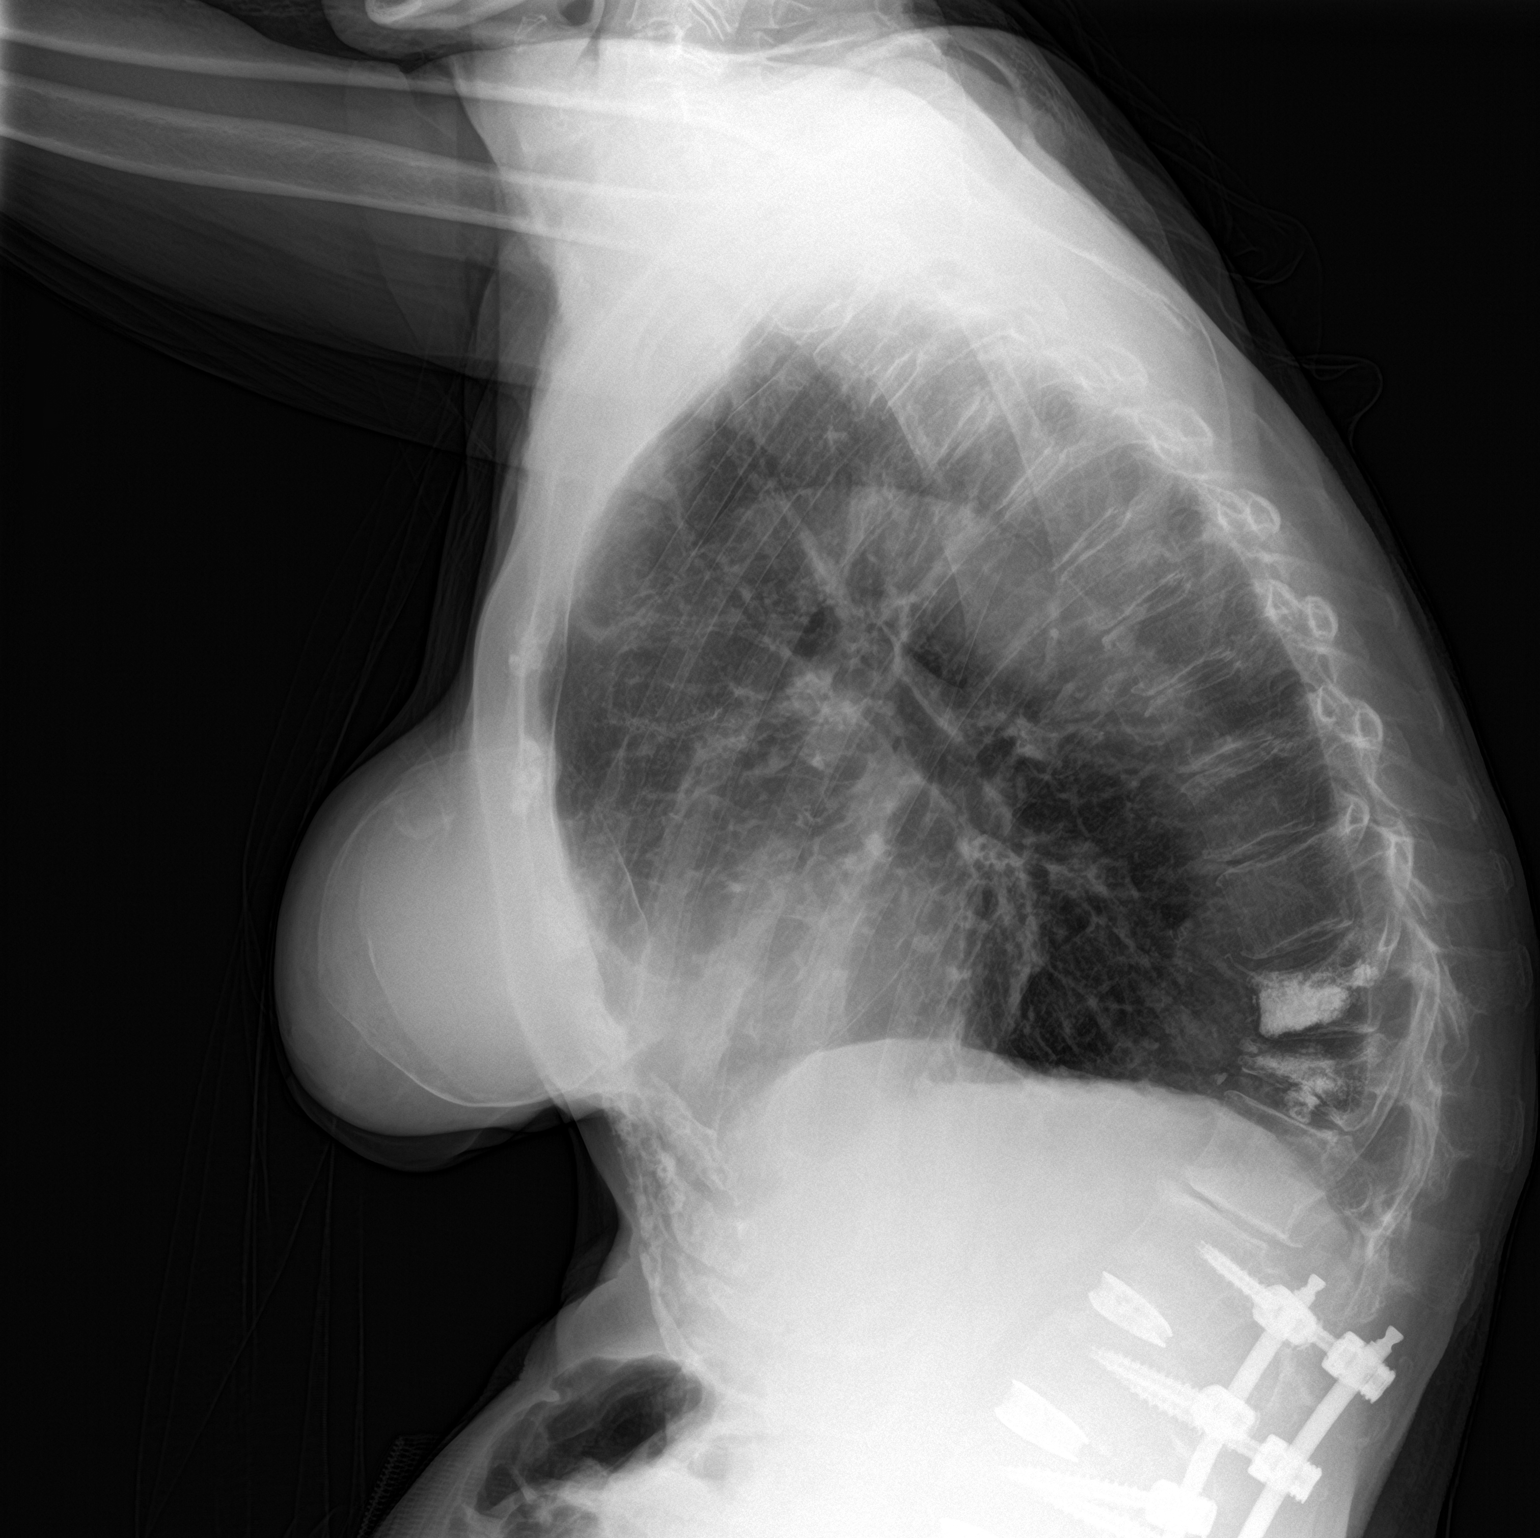

[2 of 2 positions shown; findings below may reference images not displayed]

FINDINGS: No new consolidation. Scarring at the lung bases. No pleural
effusion. No pneumothorax. Stable cardiomediastinal contours. No
acute osseous abnormality. Partially imaged lumbar spine fusion.
Lower thoracic kyphoplasties.
IMPRESSION: No acute process in the chest.

## 2021-11-23 NOTE — Progress Notes (Signed)
[facility] ?[address] ?[place], [place] [address] ?Phone: [phone] ?Fax: [phone] ? ?SYMPTOMS MANAGEMENT CLINIC PROGRESS NOTE  ? ?Amanda Conner ?[id number] ?17-Jan-1951 ?71 y.o. ? ?Amanda Conner is managed by Dr. Delton Coombes for multiple myeloma ? ?Actively treated with chemotherapy/immunotherapy/hormonal therapy: YES ? ?Current therapy: DaraVRd every 21 days ? ?Last treated: Cycle #5 ?- 11/15/2021: Velcade + Darzalex ?- 11/22/2021: Velcade ? ?Next scheduled appointment with provider: 12/06/2021 ? ?Subjective:  ?Chief Complaint: Fever and productive cough ? ?Amanda Conner (71 year old female) managed by Dr. Delton Coombes for multiple myeloma.  She received cycle #5 of her DaraVRd with Velcade/Darzalex on 11/15/2021 and Velcade alone on 11/22/2021.  She is currently taking Revlimid, set to finish this round of Revlimid on Wednesday, 11/28/2021. ? 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 Lorenda Cahill at [facility].  She was previously taking bedaquiline and azithromycin, but discontinued this about 3 weeks ago, after her patient assistance grant expired.  She follows with Dr. Lorenda Cahill of [facility], and is scheduled to see him on Monday. ? ?Chief complaint today is worsening cough and fever.  She has chronic cough related to her mycobacterial infection, but reports that it has been worse than usual over the past week, it has been productive of greenish-yellow sputum.  She had fever at home with Tmax 100.5 ?F that came down with Tylenol.  She reports that she has not had any fever today.  She reports chest tightness but denies any chest pain.  She has mild ankle swelling, which is chronic.  She reports some generalized fatigue and malaise for the past several days.  She has had similar issues in the past related to her underlying lung infection and superimposed infections, and was hospitalized for pneumonia in  January 2023.  She has not had any recent sick contacts. ? ? ?Review of Systems:  ?Review of Systems  ?Constitutional:  Positive for fatigue and fever. Negative for activity change, appetite change, chills, diaphoresis and unexpected weight change.  ?HENT:  Negative for mouth sores, nosebleeds, sore throat and trouble swallowing.   ?Respiratory:  Positive for cough and chest tightness. Negative for shortness of breath.   ?Cardiovascular:  Negative for chest pain, palpitations and leg swelling (ankles).  ?Gastrointestinal:  Positive for diarrhea. Negative for abdominal pain, blood in stool, constipation, nausea and vomiting.  ?Genitourinary:  Negative for dysuria and hematuria.  ?Musculoskeletal:  Positive for back pain.  ?Neurological:  Negative for dizziness, light-headedness, numbness and headaches.  ?Psychiatric/Behavioral:  Negative for dysphoric mood and sleep disturbance. The patient is not nervous/anxious.   ? ? ?Past Medical History, Surgical history, Social history, and Family history were reviewed as documented elsewhere in chart, and were updated as appropriate.  ? ?Assessment & Plan:    ?1.  Fever & cough ?- Acute on chronic cough, now productive of yellowish-green sputum for the past week ?- Tmax at home 100.5 ?F yesterday ?- CXR (11/23/2021): No acute process in chest, no new consolidations or pleural effusions. ?- Labs today (11/23/2021): WBC 9.3, normal lactic acid, normal procalcitonin.  CMP at baseline. ?- COVID/flu/RSV swab pending. ?- Vital signs within normal limits during today's visit.  She is afebrile with 100% oxygen saturation.  No tachycardia, tachypnea, or hypotension. ?- Difficult to determine if this is an acute URI or a flareup of her chronic atypical Mycobacterium infection, especially given the fact that she stopped her Mycobacterium treatment (  bedaquilline and azithromycin) about 3 weeks ago. ?- PLAN: Patient reports that she still has azithromycin at home.  I have instructed her to  restart her azithromycin (500 mg today, followed by 250 mg daily) ?- Continue Tylenol as needed for fever.  Continue Tessalon Perles and Hycodan syrup for cough. ?- Strict return precautions given for patient to present to ED if she worsens over the weekend. ?- She has appointment with Dr. Lorenda Cahill ([facility]) on Monday, 11/26/2021 ? ?2.  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 Lorenda Cahill at [facility] ?- She was previously taking bedaquiline and azithromycin, but discontinued this about 3 weeks ago, after her patient assistance grant expired.  ?- PLAN: She has an appointment with Dr. Lorenda Cahill ([facility]) on Monday, 11/26/2021 ? ?3.  Multiple myeloma ?- Managed by Dr. Delton Coombes for multiple myeloma. ?- She received cycle #5 of her DaraVRd with Velcade/Darzalex on 11/15/2021 and Velcade alone on 11/22/2021. ?-- She is currently taking Revlimid, set to finish this round of Revlimid on Wednesday, 11/28/2021. ?- PLAN: Discussed with on-call oncologist (Dr. Marin Olp), who recommended temporarily holding patient's Revlimid due to concern for active infection. ?- Her primary oncologist is currently unavailable, but we will discuss with him on Monday with the patient should resume Revlimid treatment, and if her next Velcade injection needs to be held. ?- Continue follow-up with Dr. Delton Coombes with next scheduled appointment on 12/06/2021 ? ? ?Objective:  ? ?Physical Exam:  ?BP 130/75 (BP Location: Right Arm, Patient Position: Sitting)   Pulse 78   Temp 98.7 ?F (37.1 ?C) (Oral)   Resp 18   Ht _0  (1.549 m)   Wt 120 lb 13 oz (54.8 kg)   SpO2 100%   BMI 22.83 kg/m?  ?ECOG: 2 ? ?Physical Exam ?Constitutional:   ?   Appearance: Normal appearance.  ?HENT:  ?   Head: Normocephalic and atraumatic.  ?   Mouth/Throat:  ?   Mouth: Mucous membranes are moist.  ?Eyes:  ?   Extraocular Movements: Extraocular movements intact.  ?   Pupils: Pupils are equal, round, and reactive  to light.  ?Cardiovascular:  ?   Rate and Rhythm: Normal rate and regular rhythm.  ?   Pulses: Normal pulses.  ?   Heart sounds: Normal heart sounds.  ?Pulmonary:  ?   Effort: Pulmonary effort is normal.  ?   Breath sounds: Normal breath sounds.  ?   Comments: Diminished bases ?Abdominal:  ?   General: Bowel sounds are normal.  ?   Palpations: Abdomen is soft.  ?   Tenderness: There is no abdominal tenderness.  ?Musculoskeletal:     ?   General: No swelling.  ?   Right lower leg: Edema (trace ankle edema) present.  ?   Left lower leg: Edema (trace ankle edema) present.  ?Lymphadenopathy:  ?   Cervical: No cervical adenopathy.  ?Skin: ?   General: Skin is warm and dry.  ?Neurological:  ?   General: No focal deficit present.  ?   Mental Status: She is alert and oriented to person, place, and time.  ?Psychiatric:     ?   Mood and Affect: Mood normal.     ?   Behavior: Behavior normal.  ? ? ?Lab Review:  ?   ?Component Value Date/Time  ? NA 134 (L) 11/22/2021 1023  ? K 4.3 11/22/2021 1023  ? CL 101 11/22/2021 1023  ? CO2 27 11/22/2021 1023  ?  GLUCOSE 97 11/22/2021 1023  ? BUN 12 11/22/2021 1023  ? CREATININE 0.68 11/22/2021 1023  ? CALCIUM 8.7 (L) 11/22/2021 1023  ? PROT 6.5 11/22/2021 1023  ? ALBUMIN 3.9 11/22/2021 1023  ? AST 20 11/22/2021 1023  ? ALT 18 11/22/2021 1023  ? ALKPHOS 64 11/22/2021 1023  ? BILITOT 0.5 11/22/2021 1023  ? GFRNONAA >60 11/22/2021 1023  ? GFRAA >60 10/01/2019 1117  ? ? ?   ?Component Value Date/Time  ? WBC 9.3 11/23/2021 1409  ? RBC 3.85 (L) 11/23/2021 1409  ? HGB 10.7 (L) 11/23/2021 1409  ? HCT 33.4 (L) 11/23/2021 1409  ? PLT 260 11/23/2021 1409  ? MCV 86.8 11/23/2021 1409  ? MCH 27.8 11/23/2021 1409  ? MCHC 32.0 11/23/2021 1409  ? RDW 18.9 (H) 11/23/2021 1409  ? LYMPHSABS 1.3 11/23/2021 1409  ? MONOABS 0.4 11/23/2021 1409  ? EOSABS 0.6 (H) 11/23/2021 1409  ? BASOSABS 0.0 11/23/2021 1409  ? ?------------------------------- ? ?Imaging from last 24 hours (if applicable): ? ?Radiology  interpretation: ?DG Chest 2 View ? ?Result Date: 11/23/2021 ?CLINICAL DATA:  Acute productive cough EXAM: CHEST - 2 VIEW COMPARISON:  08/06/2021 FINDINGS: No new consolidation. Scarring at the lung bases. No pleural effusio

## 2021-11-23 NOTE — Patient Instructions (Addendum)
[facility] at [facility] ?Discharge Instructions ? ?You were seen today by Tarri Abernethy PA-C for your symptom management visit. ? ?Your fever and productive cough may be related to your underlying Mycobacterium lung infection, especially since you were taken off of your antibiotics 3 weeks ago. ? ?Since she will not be able to get in touch with your infectious disease doctor until Monday, I recommend that you restart azithromycin 250 mg - take 2 tablets today, followed by 1 tablet daily starting tomorrow.  You can discuss the need for further medication with Dr. Lorenda Cahill on Monday. ? ?If you experience any worsening of your symptoms over the weekend - such as difficulty breathing, fever higher than 101.5 ?F that does not improve with Tylenol, chest pain, or other any concerning symptoms - you should go immediately to your nearest emergency department. ? ?For the time being, we will also have you STOP taking your Revlimid, since chemotherapy and cancer drugs can suppress your immune system's ability to fight off infections. ? ?We will discuss with Dr. Delton Coombes to see if you should still receive your injections next week.  We will call you to let you know what he recommends. ? ?FOLLOW-UP APPOINTMENT: You are scheduled to see Dr. Delton Coombes on 12/06/2021. ? ? ?Thank you for choosing [facility] at [facility] to provide your oncology and hematology care.  To afford each patient quality time with our provider, please arrive at least 15 minutes before your scheduled appointment time.  ? ?If you have a lab appointment with the [facility] please come in thru the Main Entrance and check in at the main information desk. ? ?You need to re-schedule your appointment should you arrive 10 or more minutes late.  We strive to give you quality time with our providers, and arriving late affects you and other patients whose appointments are after yours.  Also, if you no show three  or more times for appointments you may be dismissed from the clinic at the providers discretion.     ?Again, thank you for choosing [facility].  Our hope is that these requests will decrease the amount of time that you wait before being seen by our physicians.       ?_____________________________________________________________ ? ?Should you have questions after your visit to [facility], please contact our office at [phone] and follow the prompts.  Our office hours are 8:00 a.m. and 4:30 p.m. Monday - Friday.  Please note that voicemails left after 4:00 p.m. may not be returned until the following business day.  We are closed weekends and major holidays.  You do have access to a nurse 24-7, just call the main number to the clinic [phone] and do not press any options, hold on the line and a nurse will answer the phone.   ? ?For prescription refill requests, have your pharmacy contact our office and allow 72 hours.   ? ?Due to Covid, you will need to wear a mask upon entering the hospital. If you do not have a mask, a mask will be given to you at the Main Entrance upon arrival. For doctor visits, patients may have 1 support person age 27 or older with them. For treatment visits, patients can not have anyone with them due to social distancing guidelines and our immunocompromised population.  ? ? ? ?

## 2021-11-24 ENCOUNTER — Encounter (HOSPITAL_COMMUNITY): Payer: Self-pay | Admitting: Hematology

## 2021-11-26 LAB — PROTEIN ELECTROPHORESIS, SERUM
A/G Ratio: 1.7 (ref 0.7–1.7)
Albumin ELP: 3.8 g/dL (ref 2.9–4.4)
Alpha-1-Globulin: 0.3 g/dL (ref 0.0–0.4)
Alpha-2-Globulin: 0.6 g/dL (ref 0.4–1.0)
Beta Globulin: 0.8 g/dL (ref 0.7–1.3)
Gamma Globulin: 0.5 g/dL (ref 0.4–1.8)
Globulin, Total: 2.3 g/dL (ref 2.2–3.9)
M-Spike, %: 0.2 g/dL — ABNORMAL HIGH
Total Protein ELP: 6.1 g/dL (ref 6.0–8.5)

## 2021-11-27 ENCOUNTER — Other Ambulatory Visit (HOSPITAL_COMMUNITY): Payer: Self-pay | Admitting: *Deleted

## 2021-11-29 ENCOUNTER — Inpatient Hospital Stay (HOSPITAL_COMMUNITY): Payer: Medicare Other

## 2021-11-29 VITALS — BP 154/88 | HR 74 | Temp 97.6°F | Resp 16 | Wt 129.2 lb

## 2021-11-29 DIAGNOSIS — C9000 Multiple myeloma not having achieved remission: Secondary | ICD-10-CM

## 2021-11-29 DIAGNOSIS — Z5112 Encounter for antineoplastic immunotherapy: Secondary | ICD-10-CM | POA: Diagnosis not present

## 2021-11-29 LAB — COMPREHENSIVE METABOLIC PANEL
ALT: 17 U/L (ref 0–44)
AST: 21 U/L (ref 15–41)
Albumin: 4.1 g/dL (ref 3.5–5.0)
Alkaline Phosphatase: 64 U/L (ref 38–126)
Anion gap: 7 (ref 5–15)
BUN: 16 mg/dL (ref 8–23)
CO2: 27 mmol/L (ref 22–32)
Calcium: 8.9 mg/dL (ref 8.9–10.3)
Chloride: 102 mmol/L (ref 98–111)
Creatinine, Ser: 0.7 mg/dL (ref 0.44–1.00)
GFR, Estimated: >60 mL/min (ref >60)
Glucose, Bld: 92 mg/dL (ref 70–99)
Potassium: 4.1 mmol/L (ref 3.5–5.1)
Sodium: 136 mmol/L (ref 135–145)
Total Bilirubin: 0.4 mg/dL (ref 0.3–1.2)
Total Protein: 6.9 g/dL (ref 6.5–8.1)

## 2021-11-29 LAB — CBC WITH DIFFERENTIAL/PLATELET
Abs Immature Granulocytes: 0.01 10*3/uL (ref 0.00–0.07)
Basophils Absolute: 0.1 10*3/uL (ref 0.0–0.1)
Basophils Relative: 1 %
Eosinophils Absolute: 0.2 10*3/uL (ref 0.0–0.5)
Eosinophils Relative: 3 %
HCT: 35.2 % — ABNORMAL LOW (ref 36.0–46.0)
Hemoglobin: 11 g/dL — ABNORMAL LOW (ref 12.0–15.0)
Immature Granulocytes: 0 %
Lymphocytes Relative: 27 %
Lymphs Abs: 2 10*3/uL (ref 0.7–4.0)
MCH: 26.6 pg (ref 26.0–34.0)
MCHC: 31.3 g/dL (ref 30.0–36.0)
MCV: 85.2 fL (ref 80.0–100.0)
Monocytes Absolute: 1 10*3/uL (ref 0.1–1.0)
Monocytes Relative: 14 %
Neutro Abs: 4.1 10*3/uL (ref 1.7–7.7)
Neutrophils Relative %: 55 %
Platelets: 328 10*3/uL (ref 150–400)
RBC: 4.13 MIL/uL (ref 3.87–5.11)
RDW: 17.8 % — ABNORMAL HIGH (ref 11.5–15.5)
WBC: 7.4 10*3/uL (ref 4.0–10.5)
nRBC: 0 % (ref 0.0–0.2)

## 2021-11-29 LAB — MAGNESIUM: Magnesium: 1.9 mg/dL (ref 1.7–2.4)

## 2021-11-29 MED ORDER — PROCHLORPERAZINE MALEATE 10 MG PO TABS
10.0000 mg | ORAL_TABLET | Freq: Once | ORAL | Status: AC
  Administered 2021-11-29: 10 mg via ORAL
  Filled 2021-11-29: qty 1

## 2021-11-29 MED ORDER — BORTEZOMIB CHEMO SQ INJECTION 3.5 MG (2.5MG/ML)
1.3000 mg/m2 | Freq: Once | INTRAMUSCULAR | Status: AC
  Administered 2021-11-29: 2.25 mg via SUBCUTANEOUS
  Filled 2021-11-29: qty 0.9

## 2021-11-29 NOTE — Patient Instructions (Signed)
[facility]  Discharge Instructions: Thank you for choosing [facility] to provide your oncology and hematology care.  If you have a lab appointment with the [facility], please come in thru the Main Entrance and check in at the main information desk.  Wear comfortable clothing and clothing appropriate for easy access to any Portacath or PICC line.   We strive to give you quality time with your provider. You may need to reschedule your appointment if you arrive late (15 or more minutes).  Arriving late affects you and other patients whose appointments are after yours.  Also, if you miss three or more appointments without notifying the office, you may be dismissed from the clinic at the provider's discretion.      For prescription refill requests, have your pharmacy contact our office and allow 72 hours for refills to be completed.    Today you received the following chemotherapy and/or immunotherapy agents Velcade.  Bortezomib injection What is this medication? BORTEZOMIB (bor TEZ oh mib) targets proteins in cancer cells and stops the cancer cells from growing. It treats multiple myeloma and mantle cell lymphoma. This medicine may be used for other purposes; ask your health care provider or pharmacist if you have questions. COMMON BRAND NAME(S): Velcade What should I tell my care team before I take this medication? They need to know if you have any of these conditions: dehydration diabetes (high blood sugar) heart disease liver disease tingling of the fingers or toes or other nerve disorder an unusual or allergic reaction to bortezomib, mannitol, boron, other medicines, foods, dyes, or preservatives pregnant or trying to get pregnant breast-feeding How should I use this medication? This medicine is injected into a vein or under the skin. It is given by a health care provider in a hospital or clinic setting. Talk to your health care provider about the use of  this medicine in children. Special care may be needed. Overdosage: If you think you have taken too much of this medicine contact a poison control center or emergency room at once. NOTE: This medicine is only for you. Do not share this medicine with others. What if I miss a dose? Keep appointments for follow-up doses. It is important not to miss your dose. Call your health care provider if you are unable to keep an appointment. What may interact with this medication? This medicine may interact with the following medications: ketoconazole rifampin This list may not describe all possible interactions. Give your health care provider a list of all the medicines, herbs, non-prescription drugs, or dietary supplements you use. Also tell them if you smoke, drink alcohol, or use illegal drugs. Some items may interact with your medicine. What should I watch for while using this medication? Your condition will be monitored carefully while you are receiving this medicine. You may need blood work done while you are taking this medicine. You may get drowsy or dizzy. Do not drive, use machinery, or do anything that needs mental alertness until you know how this medicine affects you. Do not stand up or sit up quickly, especially if you are an older patient. This reduces the risk of dizzy or fainting spells This medicine may increase your risk of getting an infection. Call your health care provider for advice if you get a fever, chills, sore throat, or other symptoms of a cold or flu. Do not treat yourself. Try to avoid being around people who are sick. Check with your health care provider  if you have severe diarrhea, nausea, and vomiting, or if you sweat a lot. The loss of too much body fluid may make it dangerous for you to take this medicine. Do not become pregnant while taking this medicine or for 7 months after stopping it. Women should inform their health care provider if they wish to become pregnant or think  they might be pregnant. Men should not father a child while taking this medicine and for 4 months after stopping it. There is a potential for serious harm to an unborn child. Talk to your health care provider for more information. Do not breast-feed an infant while taking this medicine or for 2 months after stopping it. This medicine may make it more difficult to get pregnant or father a child. Talk to your health care provider if you are concerned about your fertility. What side effects may I notice from receiving this medication? Side effects that you should report to your doctor or health care professional as soon as possible: allergic reactions (skin rash; itching or hives; swelling of the face, lips, or tongue) bleeding (bloody or black, tarry stools; red or dark Amanda Conner urine; spitting up blood or Amanda Conner material that looks like coffee grounds; red spots on the skin; unusual bruising or bleeding from the eye, gums, or nose) blurred vision or changes in vision confusion constipation headache heart failure (trouble breathing; fast, irregular heartbeat; sudden weight gain; swelling of the ankles, feet, hands) infection (fever, chills, cough, sore throat, pain or trouble passing urine) lack or loss of appetite liver injury (dark yellow or Amanda Conner urine; general ill feeling or flu-like symptoms; loss of appetite, right upper belly pain; yellowing of the eyes or skin) low blood pressure (dizziness; feeling faint or lightheaded, falls; unusually weak or tired) muscle cramps pain, redness, or irritation at site where injected pain, tingling, numbness in the hands or feet seizures trouble breathing unusual bruising or bleeding Side effects that usually do not require medical attention (report to your doctor or health care professional if they continue or are bothersome): diarrhea nausea stomach pain trouble sleeping vomiting This list may not describe all possible side effects. Call your doctor  for medical advice about side effects. You may report side effects to FDA at 1-800-FDA-1088. Where should I keep my medication? This medicine is given in a hospital or clinic. It will not be stored at home. NOTE: This sheet is a summary. It may not cover all possible information. If you have questions about this medicine, talk to your doctor, pharmacist, or health care provider.  2023 Elsevier/Gold Standard (2020-06-22 00:00:00)       To help prevent nausea and vomiting after your treatment, we encourage you to take your nausea medication as directed.  BELOW ARE SYMPTOMS THAT SHOULD BE REPORTED IMMEDIATELY: *FEVER GREATER THAN 100.4 F (38 C) OR HIGHER *CHILLS OR SWEATING *NAUSEA AND VOMITING THAT IS NOT CONTROLLED WITH YOUR NAUSEA MEDICATION *UNUSUAL SHORTNESS OF BREATH *UNUSUAL BRUISING OR BLEEDING *URINARY PROBLEMS (pain or burning when urinating, or frequent urination) *BOWEL PROBLEMS (unusual diarrhea, constipation, pain near the anus) TENDERNESS IN MOUTH AND THROAT WITH OR WITHOUT PRESENCE OF ULCERS (sore throat, sores in mouth, or a toothache) UNUSUAL RASH, SWELLING OR PAIN  UNUSUAL VAGINAL DISCHARGE OR ITCHING   Items with * indicate a potential emergency and should be followed up as soon as possible or go to the Emergency Department if any problems should occur.  Please show the CHEMOTHERAPY ALERT CARD or IMMUNOTHERAPY ALERT CARD at check-in  to the Emergency Department and triage nurse.  Should you have questions after your visit or need to cancel or reschedule your appointment, please contact [facility] [phone]  and follow the prompts.  Office hours are 8:00 a.m. to 4:30 p.m. Monday - Friday. Please note that voicemails left after 4:00 p.m. may not be returned until the following business day.  We are closed weekends and major holidays. You have access to a nurse at all times for urgent questions. Please call the main number to the clinic [phone] and follow  the prompts.  For any non-urgent questions, you may also contact your provider using MyChart. We now offer e-Visits for anyone 60 and older to request care online for non-urgent symptoms. For details visit [email or web address].   Also download the MyChart app! Go to the app store, search "MyChart", open the app, select [facility], and log in with your MyChart username and password.  Due to Covid, a mask is required upon entering the hospital/clinic. If you do not have a mask, one will be given to you upon arrival. For doctor visits, patients may have 1 support person aged 9 or older with them. For treatment visits, patients cannot have anyone with them due to current Covid guidelines and our immunocompromised population.

## 2021-11-29 NOTE — Progress Notes (Signed)
Patient presents today for Velcade injection.  Patient is in satisfactory condition with no new complaints voiced.  Vital signs are stable.  Labs reviewed and all labs are within treatment parameters.  We will proceed with injection per MD orders.   Patient tolerated Velcade injection with no complaints voiced.  Site clean and dry with no bruising or swelling noted.  No complaints of pain.  Discharged with vital signs stable and no signs or symptoms of distress noted.   

## 2021-12-06 ENCOUNTER — Inpatient Hospital Stay (HOSPITAL_COMMUNITY): Payer: Medicare Other

## 2021-12-06 ENCOUNTER — Inpatient Hospital Stay (HOSPITAL_BASED_OUTPATIENT_CLINIC_OR_DEPARTMENT_OTHER): Payer: Medicare Other | Admitting: Hematology

## 2021-12-06 VITALS — BP 125/68 | HR 79 | Temp 98.5°F | Resp 18 | Ht 61.0 in | Wt 126.4 lb

## 2021-12-06 DIAGNOSIS — C9000 Multiple myeloma not having achieved remission: Secondary | ICD-10-CM | POA: Diagnosis not present

## 2021-12-06 DIAGNOSIS — M899 Disorder of bone, unspecified: Secondary | ICD-10-CM | POA: Diagnosis not present

## 2021-12-06 DIAGNOSIS — Z5112 Encounter for antineoplastic immunotherapy: Secondary | ICD-10-CM | POA: Diagnosis not present

## 2021-12-06 LAB — CBC WITH DIFFERENTIAL/PLATELET
Abs Immature Granulocytes: 0.01 10*3/uL (ref 0.00–0.07)
Basophils Absolute: 0.1 10*3/uL (ref 0.0–0.1)
Basophils Relative: 1 %
Eosinophils Absolute: 0.4 10*3/uL (ref 0.0–0.5)
Eosinophils Relative: 5 %
HCT: 37.3 % (ref 36.0–46.0)
Hemoglobin: 12.1 g/dL (ref 12.0–15.0)
Immature Granulocytes: 0 %
Lymphocytes Relative: 24 %
Lymphs Abs: 1.6 10*3/uL (ref 0.7–4.0)
MCH: 27.8 pg (ref 26.0–34.0)
MCHC: 32.4 g/dL (ref 30.0–36.0)
MCV: 85.7 fL (ref 80.0–100.0)
Monocytes Absolute: 0.6 10*3/uL (ref 0.1–1.0)
Monocytes Relative: 9 %
Neutro Abs: 4.2 10*3/uL (ref 1.7–7.7)
Neutrophils Relative %: 61 %
Platelets: 202 10*3/uL (ref 150–400)
RBC: 4.35 MIL/uL (ref 3.87–5.11)
RDW: 18.6 % — ABNORMAL HIGH (ref 11.5–15.5)
WBC: 6.8 10*3/uL (ref 4.0–10.5)
nRBC: 0 % (ref 0.0–0.2)

## 2021-12-06 LAB — COMPREHENSIVE METABOLIC PANEL
ALT: 15 U/L (ref 0–44)
AST: 20 U/L (ref 15–41)
Albumin: 3.9 g/dL (ref 3.5–5.0)
Alkaline Phosphatase: 66 U/L (ref 38–126)
Anion gap: 5 (ref 5–15)
BUN: 13 mg/dL (ref 8–23)
CO2: 28 mmol/L (ref 22–32)
Calcium: 8.6 mg/dL — ABNORMAL LOW (ref 8.9–10.3)
Chloride: 101 mmol/L (ref 98–111)
Creatinine, Ser: 0.74 mg/dL (ref 0.44–1.00)
GFR, Estimated: >60 mL/min (ref >60)
Glucose, Bld: 94 mg/dL (ref 70–99)
Potassium: 3.6 mmol/L (ref 3.5–5.1)
Sodium: 134 mmol/L — ABNORMAL LOW (ref 135–145)
Total Bilirubin: 0.7 mg/dL (ref 0.3–1.2)
Total Protein: 6.4 g/dL — ABNORMAL LOW (ref 6.5–8.1)

## 2021-12-06 LAB — MAGNESIUM: Magnesium: 2.1 mg/dL (ref 1.7–2.4)

## 2021-12-06 MED ORDER — DARATUMUMAB-HYALURONIDASE-FIHJ 1800-30000 MG-UT/15ML ~~LOC~~ SOLN
1800.0000 mg | Freq: Once | SUBCUTANEOUS | Status: AC
  Administered 2021-12-06: 1800 mg via SUBCUTANEOUS
  Filled 2021-12-06: qty 15

## 2021-12-06 MED ORDER — DIPHENHYDRAMINE HCL 25 MG PO CAPS
50.0000 mg | ORAL_CAPSULE | Freq: Once | ORAL | Status: AC
  Administered 2021-12-06: 50 mg via ORAL
  Filled 2021-12-06: qty 2

## 2021-12-06 MED ORDER — BORTEZOMIB CHEMO SQ INJECTION 3.5 MG (2.5MG/ML)
1.3000 mg/m2 | Freq: Once | INTRAMUSCULAR | Status: AC
  Administered 2021-12-06: 2.25 mg via SUBCUTANEOUS
  Filled 2021-12-06: qty 0.9

## 2021-12-06 MED ORDER — ACETAMINOPHEN 325 MG PO TABS
650.0000 mg | ORAL_TABLET | Freq: Once | ORAL | Status: AC
  Administered 2021-12-06: 650 mg via ORAL
  Filled 2021-12-06: qty 2

## 2021-12-06 MED ORDER — SODIUM CHLORIDE 0.9 % IV SOLN: Freq: Once | INTRAVENOUS | Status: AC

## 2021-12-06 MED ORDER — ZOLEDRONIC ACID 4 MG/100ML IV SOLN
4.0000 mg | Freq: Once | INTRAVENOUS | Status: AC
  Administered 2021-12-06: 4 mg via INTRAVENOUS
  Filled 2021-12-06: qty 100

## 2021-12-06 MED ORDER — DEXAMETHASONE 4 MG PO TABS
20.0000 mg | ORAL_TABLET | Freq: Once | ORAL | Status: AC
  Administered 2021-12-06: 20 mg via ORAL
  Filled 2021-12-06: qty 5

## 2021-12-06 NOTE — Progress Notes (Signed)
Amanda Conner, [place] [id number]   CLINIC:  Medical Oncology/Hematology  PCP:  Amanda Neu, MD [facility] and Amanda Conner [address] /* [phone]   REASON FOR VISIT:  Follow-up for multiple myeloma  PRIOR THERAPY: none  NGS Results: not done  CURRENT THERAPY: DaraVRd (Daratumumab SQ) q21d x 6 Cycles (Induction/Consolidation)  BRIEF ONCOLOGIC HISTORY:  Oncology History  Multiple myeloma ([place])  07/17/2021 Initial Diagnosis   Multiple myeloma ([place])    07/26/2021 -  Chemotherapy   Patient is on Treatment Plan : MYELOMA NEWLY DIAGNOSED TRANSPLANT CANDIDATE DaraVRd (Daratumumab SQ) q21d x 6 Cycles (Induction/Consolidation)        CANCER STAGING:  Cancer Staging  Multiple myeloma ([place]) Staging form: Multiple Myeloma, AJCC 6th Edition - Clinical stage from 07/17/2021: Stage IA - Unsigned   INTERVAL HISTORY:  [name], a 71 y.o. female, returns for routine follow-up and consideration for next cycle of chemotherapy. Amanda Conner was last seen on 11/15/2021.  Due for cycle #6 of DaraVRd today.   Overall, she tells me she has been feeling pretty well. She reports she had abdominal pain as few days after starting doxycycline; she has stopped doxycycline after taking it for 8 days and reports the abdominal pain has improved slightly. She has continued to take 15 mg Revlimid, and she has not yet started the 20 mg Revlimid. She denies numbness/tingling.   Overall, she feels ready for next cycle of chemo today.    REVIEW OF SYSTEMS:  Review of Systems  Constitutional:  Positive for appetite change and fatigue.  Respiratory:  Positive for cough.   Gastrointestinal:  Positive for abdominal pain (4/10) and nausea.  Musculoskeletal:  Positive for back pain (4/10).  Neurological:  Positive for headaches. Negative for numbness.  All other systems reviewed and are negative.  PAST MEDICAL/SURGICAL HISTORY:   Past Medical History:  Diagnosis Date   Arthritis    osteoarthritis   Back pain    Bronchiectasis (HCC)    Cancer (HCC)    basal cell nose   Chronic cough    Complication of anesthesia    Dyspnea    Gastrointestinal symptoms    GERD (gastroesophageal reflux disease)    Glaucoma    History of hiatal hernia    Hyperlipemia    Hypertension    Multiple myeloma (HCC)    Osteopenia    Pneumonia    PONV (postoperative nausea and vomiting)    "only one time"   Spondylisthesis    Past Surgical History:  Procedure Laterality Date   ABDOMINAL EXPOSURE N/A 12/29/2018   Procedure: ABDOMINAL EXPOSURE;  Surgeon: Angelia Mould, MD;  Location: [place];  Service: Vascular;  Laterality: N/A;   ABDOMINOPLASTY  2002   ABDOMINOPLASTY  1970's   ANKLE SURGERY Left 10/2015   ANTERIOR LATERAL LUMBAR FUSION WITH PERCUTANEOUS SCREW 3 LEVEL Left 12/29/2018   Procedure: Lumbar Two to Lumbar Five Anterolateral lumbar interbody fusion;  Surgeon: Erline Levine, MD;  Location: [place];  Service: Neurosurgery;  Laterality: Left;  anterolateral   ANTERIOR LUMBAR FUSION N/A 12/29/2018   Procedure: Lumbar Five Sacral One Anterior lumbar interbody fusion;  Surgeon: Erline Levine, MD;  Location: [place];  Service: Neurosurgery;  Laterality: N/A;  anterior approach   BASAL CELL CARCINOMA EXCISION  06/2018   nose   BLEPHAROPLASTY Bilateral 1999   BREAST ENHANCEMENT SURGERY  1970's   COLONOSCOPY     CYSTOURETHROSCOPY  2018  [place] J ureteal stents   DECOMPRESSION CORE HIP Left 2014   FACIAL COSMETIC SURGERY  2004   FLUOROSCOPY GUIDANCE     HIP ARTHROPLASTY Left    INCISIONAL HERNIA REPAIR N/A 10/08/2019   Procedure: OPEN INCISIONAL HERNIA REPAIR WITH MESH;  Surgeon: Armandina Gemma, MD;  Location: [facility];  Service: General;  Laterality: N/A;   JOINT REPLACEMENT Left    Hip; 05/2014   KYPHOPLASTY N/A 08/31/2021   Procedure: KYPHOPLASTY THORACIC ELEVEN, THORACIC TWELVE;  Surgeon: Karsten Ro, DO;   Location: [place];  Service: Neurosurgery;  Laterality: N/A;   LAPAROSCOPIC PARTIAL COLECTOMY  06/26/2017   LUMBAR PERCUTANEOUS PEDICLE SCREW 4 LEVEL N/A 12/29/2018   Procedure: Lumbar Percutaneous Pedicle Screw Placement Lumbar two-Sacral one;  Surgeon: Erline Levine, MD;  Location: [place];  Service: Neurosurgery;  Laterality: N/A;   MOHS SURGERY  2019   nose   PARTIAL COLECTOMY  06/2017   for diverticulitis   REMOVAL OF BILATERAL TISSUE EXPANDERS WITH PLACEMENT OF BILATERAL BREAST IMPLANTS  2004   THIGH LIFT  1994   TOE FUSION Right    great toe   TONSILLECTOMY     UMBILICAL HERNIA REPAIR     with tummy tuck   UMBILICAL HERNIA REPAIR  2002    SOCIAL HISTORY:  Social History   Socioeconomic History   Marital status: Married    Spouse name: Not on file   Number of children: Not on file   Years of education: Not on file   Highest education level: Not on file  Occupational History   Not on file  Tobacco Use   Smoking status: Never   Smokeless tobacco: Never  Vaping Use   Vaping Use: Never used  Substance and Sexual Activity   Alcohol use: Yes    Comment: occasional   Drug use: Never   Sexual activity: Not on file  Other Topics Concern   Not on file  Social History Narrative   Not on file   Social Determinants of Health   Financial Resource Strain: Low Risk    Difficulty of Paying Living Expenses: Not hard at all  Food Insecurity: No Food Insecurity   Worried About Charity fundraiser in the Last Year: Never true   [place] in the Last Year: Never true  Transportation Needs: No Transportation Needs   Lack of Transportation (Medical): No   Lack of Transportation (Non-Medical): No  Physical Activity: Not on file  Stress: Not on file  Social Connections: Socially Integrated   Frequency of Communication with Friends and Family: More than three times a week   Frequency of Social Gatherings with Friends and Family: More than three times a week   Attends Religious  Services: 1 to 4 times per year   Active Member of [employer] or Organizations: No   Attends Music therapist: 1 to 4 times per year   Marital Status: Married  Human resources officer Violence: Not on file    FAMILY HISTORY:  Family History  Problem Relation Age of Onset   Hypertension Mother    COPD Mother    Hypertension Father    Heart disease Father     CURRENT MEDICATIONS:  Current Outpatient Medications  Medication Sig Dispense Refill   albuterol (VENTOLIN HFA) 108 (90 Base) MCG/ACT inhaler Inhale 2 puffs into the lungs every 6 (six) hours as needed for wheezing or shortness of breath.     aspirin EC 81 MG tablet Take 81  mg by mouth daily. Swallow whole.     Baclofen 5 MG TABS Take 1 tablet by mouth 3 (three) times daily as needed.     Bedaquiline Fumarate (SIRTURO) 100 MG TABS Take 200 mg by mouth every Monday, Wednesday, and Friday.     benzonatate (TESSALON) 100 MG capsule Take 100 mg by mouth 3 (three) times daily as needed for cough.     Biotin 1000 MCG tablet Take 1,000 mcg by mouth daily.     calcitonin, salmon, (MIACALCIN/FORTICAL) 200 UNIT/ACT nasal spray Place 1 spray into alternate nostrils daily.     Calcium Carb-Cholecalciferol 600-800 MG-UNIT TABS Take 1 tablet by mouth daily.     daratumumab-hyaluronidase-fihj (DARZALEX FASPRO) 1800-30000 MG-UT/15ML SOLN Inject 1,800 mg into the skin once a week.     dexamethasone (DECADRON) 2 MG tablet Take 5 tablets (10 mg total) by mouth once a week. 20 tablet 4   dextromethorphan-guaiFENesin (MUCINEX DM) 30-600 MG 12hr tablet Take 1 tablet by mouth daily.      doxycycline (MONODOX) 100 MG capsule Take 100 mg by mouth 2 (two) times daily. For 10 days     estrogen, conjugated,-medroxyprogesterone (PREMPRO) 0.45-1.5 MG tablet Take 1 tablet by mouth daily.     feeding supplement (ENSURE ENLIVE / ENSURE PLUS) LIQD Take 237 mLs by mouth 2 (two) times daily between meals. (Patient taking differently: Take 237 mLs by mouth daily.)  237 mL 12   fluconazole (DIFLUCAN) 150 MG tablet Take by mouth.     fluticasone (FLONASE) 50 MCG/ACT nasal spray Place 1 spray into both nostrils daily.     furosemide (LASIX) 20 MG tablet Take 1 tablet (20 mg total) by mouth daily as needed. 30 tablet 2   hydrochlorothiazide (HYDRODIURIL) 25 MG tablet Take 25 mg by mouth daily as needed (swelling).     HYDROcodone bit-homatropine (HYCODAN) 5-1.5 MG/5ML syrup Take 5 mLs by mouth every 6 (six) hours as needed for cough. 473 mL 0   Lactulose 20 GM/30ML SOLN Take 30 ml by mouth every 3 hours until bowel movement is had, then take 30 ml daily (Patient taking differently: Take 20 g by mouth daily as needed (constipation).) 946 mL 3   latanoprost (XALATAN) 0.005 % ophthalmic solution Place 1 drop into both eyes at bedtime.      lenalidomide (REVLIMID) 20 MG capsule Take 1 capsule (20 mg total) by mouth daily. 14 days on, 7 days off 14 capsule 0   linaclotide (LINZESS) 145 MCG CAPS capsule Take 145 mcg by mouth daily as needed (constipation).     lisinopril (ZESTRIL) 20 MG tablet Take 20 mg by mouth daily.     loratadine (CLARITIN) 10 MG tablet Take 10 mg by mouth daily.     methocarbamol (ROBAXIN) 500 MG tablet Take 1 tablet (500 mg total) by mouth every 6 (six) hours as needed for muscle spasms. 40 tablet 3   omeprazole (PRILOSEC) 20 MG capsule Take 20 mg by mouth daily.     ondansetron (ZOFRAN ODT) 4 MG disintegrating tablet Take 1 tablet (4 mg total) by mouth every 4 (four) hours as needed for nausea or vomiting. 20 tablet 6   Oxycodone HCl 10 MG TABS Take 1 tablet (10 mg total) by mouth every 6 (six) hours as needed. 120 tablet 0   pantoprazole (PROTONIX) 40 MG tablet Take 40 mg by mouth daily.     sucralfate (CARAFATE) 1 g tablet Take 1 g by mouth 4 (four) times daily.  SUMAtriptan (IMITREX) 100 MG tablet Take 100 mg by mouth every 2 (two) hours as needed for migraine. May repeat in 2 hours if headache persists or recurs.     valACYclovir  (VALTREX) 500 MG tablet Take 1 tablet (500 mg total) by mouth 2 (two) times daily. 60 tablet 6   No current facility-administered medications for this visit.    ALLERGIES:  Allergies  Allergen Reactions   Tobramycin-Dexamethasone Other (See Comments)    Itching and swelling   Ethambutol Other (See Comments)    Fever   Neomycin-Polymyxin-Dexameth Itching and Swelling   Amikacin Other (See Comments)    Eosinophilia with chills and aching when given with cefoxitin   Biaxin [Clarithromycin] Itching   Cefoxitin Other (See Comments)    Eosinophlia when given with amikacin   Sulfamethoxazole-Trimethoprim Itching   Vancomycin     "Got red splotches on skin after several doses"   Bacitracin-Polymyxin B Rash    PHYSICAL EXAM:  Performance status (ECOG): 1 - Symptomatic but completely ambulatory  Vitals:   12/06/21 1042  BP: 125/68  Pulse: 79  Resp: 18  Temp: 98.5 F (36.9 C)  SpO2: 98%   Wt Readings from Last 3 Encounters:  12/06/21 126 lb 6.4 oz (57.3 kg)  11/29/21 129 lb 3.2 oz (58.6 kg)  11/23/21 120 lb 13 oz (54.8 kg)   Physical Exam Vitals reviewed.  Constitutional:      Appearance: Normal appearance.  Cardiovascular:     Rate and Rhythm: Normal rate and regular rhythm.     Pulses: Normal pulses.     Heart sounds: Normal heart sounds.  Pulmonary:     Effort: Pulmonary effort is normal.     Breath sounds: Normal breath sounds.  Neurological:     General: No focal deficit present.     Mental Status: She is alert and oriented to person, place, and time.  Psychiatric:        Mood and Affect: Mood normal.        Behavior: Behavior normal.    LABORATORY DATA:  I have reviewed the labs as listed.     Latest Ref Rng & Units 12/06/2021    9:39 AM 11/29/2021   12:51 PM 11/23/2021    2:09 PM  CBC  WBC 4.0 - 10.5 K/uL 6.8   7.4   9.3    Hemoglobin 12.0 - 15.0 g/dL 12.1   11.0   10.7    Hematocrit 36.0 - 46.0 % 37.3   35.2   33.4    Platelets 150 - 400 K/uL 202   328    260        Latest Ref Rng & Units 12/06/2021    9:39 AM 11/29/2021   12:51 PM 11/23/2021    2:09 PM  CMP  Glucose 70 - 99 mg/dL 94   92   91    BUN 8 - 23 mg/dL '13   16   10    ' Creatinine 0.44 - 1.00 mg/dL 0.74   0.70   0.70    Sodium 135 - 145 mmol/L 134   136   136    Potassium 3.5 - 5.1 mmol/L 3.6   4.1   4.0    Chloride 98 - 111 mmol/L 101   102   102    CO2 22 - 32 mmol/L '28   27   26    ' Calcium 8.9 - 10.3 mg/dL 8.6   8.9   8.3  Total Protein 6.5 - 8.1 g/dL 6.4   6.9   6.4    Total Bilirubin 0.3 - 1.2 mg/dL 0.7   0.4   0.4    Alkaline Phos 38 - 126 U/L 66   64   66    AST 15 - 41 U/L '20   21   19    ' ALT 0 - 44 U/L '15   17   19      ' DIAGNOSTIC IMAGING:  I have independently reviewed the scans and discussed with the patient. DG Chest 2 View  Result Date: 11/23/2021 CLINICAL DATA:  Acute productive cough EXAM: CHEST - 2 VIEW COMPARISON:  08/06/2021 FINDINGS: No new consolidation. Scarring at the lung bases. No pleural effusion. No pneumothorax. Stable cardiomediastinal contours. No acute osseous abnormality. Partially imaged lumbar spine fusion. Lower thoracic kyphoplasties. IMPRESSION: No acute process in the chest. Electronically Signed   By: Macy Mis M.D.   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 Wilburn Mylar - She reported discomfort in the left posterior back for the last 1 month.  She was seen by Dr. Chauncey Reading and x-rays showed abnormality.  A CT scan of the chest was done on 05/24/2021. - CT chest report reviewed by me from 05/24/2021 showed expansile oval-shaped lesion within the posterior aspect of the seventh rib with a nodule measuring 2.1 x 1.1 cm.  Adjacent cortex demonstrates area of thinning and destruction.  Stable emphysematous and interstitial changes within the lungs.  Stable pulmonary nodules within the right upper lobe and left upper lobe when compared to CT from  September 2020. - CT of the abdomen and pelvis did not show any significant abnormalities. - She denies any fevers, night sweats or weight loss in the last 6 months.  She had history of basal cell carcinoma on the nose removed by Mohs surgery. - She also reported history of osteoporosis and broke left upper rib 1 year ago after sneezing. -Reviewed PET scan from 06/14/2021.  Mild hypermetabolism involving left aspect of the L5 vertebral body and also pedicle region surrounding the screw.  SUV 4.2.  There appears to be a lytic process on the CT scan.  The lytic left posterior seventh rib lesion is mildly hypermetabolic with SUV 0.23.  No other definite lytic hypermetabolic bone lesions seen. - We have reviewed left seventh rib bone biopsy from 06/25/2021 consistent with plasma cell neoplasm.  Cells are positive for CD138, CD56 and a kappa restricted.  - Labs on 06/13/2021 M spike 1.7 g.  Immunofixation IgA kappa.  Free light chain shows kappa light chain 71, ratio 8.22.  LDH and beta-2 microglobulin was normal. - Bone marrow biopsy on 07/04/2021 consistent with plasma cell neoplasm with 26% plasma cells on aspirate with atypical features and 50 to 60% on core biopsy.  Chromosome analysis was normal.  Multiple myeloma FISH panel shows loss of NF1/chromosome 17/17 q.  Gain of CCN D1 or chromosome 11/11 q.  Gain of chromosome 4/4P.  Loss of material from the long-arm of chromosome 13.  These are all standard risk features. - 24-hour urine total protein to 40 mg.  Urine immunofixation was negative.  Urine kappa light chains were 4.13. - Dara RVD started on 07/26/2021. - T11 and T12 vertebroplasty by Dr. Reatha Armour on 08/31/2021. - She has decided against bone marrow transplant after the advice of Dr. Lorenda Cahill.  Social/family history: - She is married and lives at home with her husband.  She has not worked but had help her husband and his work.  She is a non-smoker. - Maternal grandmother had colon cancer.  Mother  had a squamous cell carcinoma of the skin.   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  M spike stable at 0.2 g.  Free light chain ratio is also more or less stable at 2.7 with kappa light chains normal at 12. - She started taking Revlimid 15 mg 1 week ago.  She will switch to Revlimid 20 mg today and take it for 1 week and will stop it.  We will sync Revlimid with her next cycle with 2 weeks on/1 week off. - Reviewed labs today which are within normal limits. - Proceed with cycle 6-day 1 today.  Will switch Darzalex to every 2 weeks.  We will get her in maintenance Revlimid with or without daratumumab after M spike undetectable and free light chain ratio normalizes. - She has discussed further with Dr. Lorenda Cahill about her lung infection and bone marrow transplant and decided against it. - RTC 4 weeks for follow-up with repeat labs 1 week prior.   2.  Mid back pain (s/p vertebroplasty of T11 and T12 by Dr. Reatha Armour on 08/31/2021): - Continue methocarbamol as needed.  3.  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 Lorenda Cahill at [facility]. - She was recently prescribed 10-day course of doxycycline which she completed.  4.  Myeloma bone disease: - Continue Zometa every 4 weeks.  Continue calcium and vitamin D supplements.  5.  ID prophylaxis: - Continue Valtrex 100 mg twice daily.  Continue aspirin 81 mg for thromboprophylaxis..   Orders placed this encounter:  No orders of the defined types were placed in this encounter.    Derek Jack, MD [place] [phone]   I, Thana Ates, am acting as a scribe for Dr. Derek Jack.  I, Derek Jack MD, have reviewed the above documentation for accuracy and completeness, and I agree with the above.

## 2021-12-06 NOTE — Patient Instructions (Signed)
[facility]  Discharge Instructions: Thank you for choosing [facility] to provide your oncology and hematology care.  If you have a lab appointment with the [facility], please come in thru the Main Entrance and check in at the main information desk.  Wear comfortable clothing and clothing appropriate for easy access to any Portacath or PICC line.   We strive to give you quality time with your provider. You may need to reschedule your appointment if you arrive late (15 or more minutes).  Arriving late affects you and other patients whose appointments are after yours.  Also, if you miss three or more appointments without notifying the office, you may be dismissed from the clinic at the provider's discretion.      For prescription refill requests, have your pharmacy contact our office and allow 72 hours for refills to be completed.    Today you received the following chemotherapy and/or immunotherapy agents Daratumumab/Velcade/Zometa      To help prevent nausea and vomiting after your treatment, we encourage you to take your nausea medication as directed.  BELOW ARE SYMPTOMS THAT SHOULD BE REPORTED IMMEDIATELY: *FEVER GREATER THAN 100.4 F (38 C) OR HIGHER *CHILLS OR SWEATING *NAUSEA AND VOMITING THAT IS NOT CONTROLLED WITH YOUR NAUSEA MEDICATION *UNUSUAL SHORTNESS OF BREATH *UNUSUAL BRUISING OR BLEEDING *URINARY PROBLEMS (pain or burning when urinating, or frequent urination) *BOWEL PROBLEMS (unusual diarrhea, constipation, pain near the anus) TENDERNESS IN MOUTH AND THROAT WITH OR WITHOUT PRESENCE OF ULCERS (sore throat, sores in mouth, or a toothache) UNUSUAL RASH, SWELLING OR PAIN  UNUSUAL VAGINAL DISCHARGE OR ITCHING   Items with * indicate a potential emergency and should be followed up as soon as possible or go to the Emergency Department if any problems should occur.  Please show the CHEMOTHERAPY ALERT CARD or IMMUNOTHERAPY ALERT CARD at check-in to the  Emergency Department and triage nurse.  Should you have questions after your visit or need to cancel or reschedule your appointment, please contact [facility] [phone]  and follow the prompts.  Office hours are 8:00 a.m. to 4:30 p.m. Monday - Friday. Please note that voicemails left after 4:00 p.m. may not be returned until the following business day.  We are closed weekends and major holidays. You have access to a nurse at all times for urgent questions. Please call the main number to the clinic [phone] and follow the prompts.  For any non-urgent questions, you may also contact your provider using MyChart. We now offer e-Visits for anyone 28 and older to request care online for non-urgent symptoms. For details visit [email or web address].   Also download the MyChart app! Go to the app store, search "MyChart", open the app, select [facility], and log in with your MyChart username and password.  Due to Covid, a mask is required upon entering the hospital/clinic. If you do not have a mask, one will be given to you upon arrival. For doctor visits, patients may have 1 support person aged 67 or older with them. For treatment visits, patients cannot have anyone with them due to current Covid guidelines and our immunocompromised population.

## 2021-12-06 NOTE — Patient Instructions (Signed)
[place] at [facility] Discharge Instructions   You were seen and examined today by Dr. Delton Coombes.  He reviewed your lab work which is normal/stable.   We will proceed with your treatment today and weekly.   Return as scheduled.   Thank you for choosing [place] at [facility] to provide your oncology and hematology care.  To afford each patient quality time with our provider, please arrive at least 15 minutes before your scheduled appointment time.   If you have a lab appointment with the [facility] please come in thru the Main Entrance and check in at the main information desk.  You need to re-schedule your appointment should you arrive 10 or more minutes late.  We strive to give you quality time with our providers, and arriving late affects you and other patients whose appointments are after yours.  Also, if you no show three or more times for appointments you may be dismissed from the clinic at the providers discretion.     Again, thank you for choosing [facility].  Our hope is that these requests will decrease the amount of time that you wait before being seen by our physicians.       _____________________________________________________________  Should you have questions after your visit to [facility], please contact our office at [phone] and follow the prompts.  Our office hours are 8:00 a.m. and 4:30 p.m. Monday - Friday.  Please note that voicemails left after 4:00 p.m. may not be returned until the following business day.  We are closed weekends and major holidays.  You do have access to a nurse 24-7, just call the main number to the clinic [phone] and do not press any options, hold on the line and a nurse will answer the phone.    For prescription refill requests, have your pharmacy contact our office and allow 72 hours.    Due to Covid, you will need to wear a mask upon entering the  hospital. If you do not have a mask, a mask will be given to you at the Main Entrance upon arrival. For doctor visits, patients may have 1 support person age 10 or older with them. For treatment visits, patients can not have anyone with them due to social distancing guidelines and our immunocompromised population.

## 2021-12-06 NOTE — Progress Notes (Signed)
Patient has been examined by Dr. Delton Coombes, and vital signs and labs have been reviewed. ANC, Creatinine, LFTs, hemoglobin, and platelets are within treatment parameters per M.D. - pt may proceed with treatment.     Patient is taking Revlimid as prescribed.  She has not missed any doses and reports no side effects at this time.

## 2021-12-06 NOTE — Progress Notes (Signed)
Patient presents today for Daratumamab, Velcadeand Zometa per providers order.  Vital signs and labs are within parameters for treatment.  Patient states that she has been on Doxycycline and has had some recent GI issues with it, Dr. Delton Coombes notified.    Message received form Anastasio Champion RN/Dr. Delton Coombes patient okay for treatment.    Peripheral IV started and blood return noted pre and post infusion.  Daratumumab/Velcade/Zometa given today per MD orders.  Stable during injections and  infusion without adverse affects.  Vital signs stable.  No complaints at this time.  Discharge from clinic ambulatory in stable condition.  Alert and oriented X 3.  Follow up with [facility] as scheduled.

## 2021-12-07 ENCOUNTER — Encounter (HOSPITAL_COMMUNITY): Payer: Self-pay | Admitting: Hematology

## 2021-12-11 ENCOUNTER — Other Ambulatory Visit (HOSPITAL_COMMUNITY): Payer: Self-pay

## 2021-12-11 MED ORDER — LENALIDOMIDE 20 MG PO CAPS: 20.0000 mg | ORAL_CAPSULE | Freq: Every day | ORAL | 0 refills | Status: DC

## 2021-12-11 NOTE — Telephone Encounter (Signed)
Chart reviewed. Revlimid refilled per last office note with Dr. Katragadda.  

## 2021-12-13 ENCOUNTER — Inpatient Hospital Stay (HOSPITAL_COMMUNITY): Payer: Medicare Other | Attending: Hematology

## 2021-12-13 ENCOUNTER — Inpatient Hospital Stay (HOSPITAL_COMMUNITY): Payer: Medicare Other

## 2021-12-13 VITALS — BP 138/72 | HR 77 | Temp 97.9°F | Resp 18 | Wt 128.0 lb

## 2021-12-13 DIAGNOSIS — Z800 Family history of malignant neoplasm of digestive organs: Secondary | ICD-10-CM | POA: Diagnosis not present

## 2021-12-13 DIAGNOSIS — Z5112 Encounter for antineoplastic immunotherapy: Secondary | ICD-10-CM | POA: Insufficient documentation

## 2021-12-13 DIAGNOSIS — Z85828 Personal history of other malignant neoplasm of skin: Secondary | ICD-10-CM | POA: Diagnosis not present

## 2021-12-13 DIAGNOSIS — R5383 Other fatigue: Secondary | ICD-10-CM | POA: Diagnosis not present

## 2021-12-13 DIAGNOSIS — K5900 Constipation, unspecified: Secondary | ICD-10-CM | POA: Diagnosis not present

## 2021-12-13 DIAGNOSIS — R051 Acute cough: Secondary | ICD-10-CM | POA: Diagnosis not present

## 2021-12-13 DIAGNOSIS — C9000 Multiple myeloma not having achieved remission: Secondary | ICD-10-CM | POA: Diagnosis present

## 2021-12-13 DIAGNOSIS — Z8651 Personal history of combat and operational stress reaction: Secondary | ICD-10-CM | POA: Diagnosis not present

## 2021-12-13 DIAGNOSIS — R110 Nausea: Secondary | ICD-10-CM | POA: Insufficient documentation

## 2021-12-13 DIAGNOSIS — Z7982 Long term (current) use of aspirin: Secondary | ICD-10-CM | POA: Diagnosis not present

## 2021-12-13 DIAGNOSIS — M545 Low back pain, unspecified: Secondary | ICD-10-CM | POA: Diagnosis not present

## 2021-12-13 DIAGNOSIS — Z840 Family history of diseases of the skin and subcutaneous tissue: Secondary | ICD-10-CM | POA: Insufficient documentation

## 2021-12-13 DIAGNOSIS — A310 Pulmonary mycobacterial infection: Secondary | ICD-10-CM | POA: Diagnosis not present

## 2021-12-13 LAB — COMPREHENSIVE METABOLIC PANEL
ALT: 18 U/L (ref 0–44)
AST: 20 U/L (ref 15–41)
Albumin: 3.9 g/dL (ref 3.5–5.0)
Alkaline Phosphatase: 60 U/L (ref 38–126)
Anion gap: 5 (ref 5–15)
BUN: 13 mg/dL (ref 8–23)
CO2: 28 mmol/L (ref 22–32)
Calcium: 8.6 mg/dL — ABNORMAL LOW (ref 8.9–10.3)
Chloride: 100 mmol/L (ref 98–111)
Creatinine, Ser: 0.71 mg/dL (ref 0.44–1.00)
GFR, Estimated: >60 mL/min (ref >60)
Glucose, Bld: 92 mg/dL (ref 70–99)
Potassium: 4.2 mmol/L (ref 3.5–5.1)
Sodium: 133 mmol/L — ABNORMAL LOW (ref 135–145)
Total Bilirubin: 0.5 mg/dL (ref 0.3–1.2)
Total Protein: 6.5 g/dL (ref 6.5–8.1)

## 2021-12-13 LAB — CBC WITH DIFFERENTIAL/PLATELET
Abs Immature Granulocytes: 0.02 10*3/uL (ref 0.00–0.07)
Basophils Absolute: 0 10*3/uL (ref 0.0–0.1)
Basophils Relative: 1 %
Eosinophils Absolute: 0.6 10*3/uL — ABNORMAL HIGH (ref 0.0–0.5)
Eosinophils Relative: 7 %
HCT: 33.4 % — ABNORMAL LOW (ref 36.0–46.0)
Hemoglobin: 10.9 g/dL — ABNORMAL LOW (ref 12.0–15.0)
Immature Granulocytes: 0 %
Lymphocytes Relative: 17 %
Lymphs Abs: 1.5 10*3/uL (ref 0.7–4.0)
MCH: 27.9 pg (ref 26.0–34.0)
MCHC: 32.6 g/dL (ref 30.0–36.0)
MCV: 85.6 fL (ref 80.0–100.0)
Monocytes Absolute: 1.1 10*3/uL — ABNORMAL HIGH (ref 0.1–1.0)
Monocytes Relative: 12 %
Neutro Abs: 5.5 10*3/uL (ref 1.7–7.7)
Neutrophils Relative %: 63 %
Platelets: 244 10*3/uL (ref 150–400)
RBC: 3.9 MIL/uL (ref 3.87–5.11)
RDW: 18.3 % — ABNORMAL HIGH (ref 11.5–15.5)
WBC: 8.7 10*3/uL (ref 4.0–10.5)
nRBC: 0 % (ref 0.0–0.2)

## 2021-12-13 LAB — MAGNESIUM: Magnesium: 1.9 mg/dL (ref 1.7–2.4)

## 2021-12-13 MED ORDER — BORTEZOMIB CHEMO SQ INJECTION 3.5 MG (2.5MG/ML)
1.3000 mg/m2 | Freq: Once | INTRAMUSCULAR | Status: AC
  Administered 2021-12-13: 2.25 mg via SUBCUTANEOUS
  Filled 2021-12-13: qty 0.9

## 2021-12-13 MED ORDER — PROCHLORPERAZINE MALEATE 10 MG PO TABS
10.0000 mg | ORAL_TABLET | Freq: Once | ORAL | Status: AC
  Administered 2021-12-13: 10 mg via ORAL
  Filled 2021-12-13: qty 1

## 2021-12-13 NOTE — Patient Instructions (Signed)
[facility]  Discharge Instructions: Thank you for choosing [facility] to provide your oncology and hematology care.  If you have a lab appointment with the Cancer Center, please come in thru the Main Entrance and check in at the main information desk.  Wear comfortable clothing and clothing appropriate for easy access to any Portacath or PICC line.   We strive to give you quality time with your provider. You may need to reschedule your appointment if you arrive late (15 or more minutes).  Arriving late affects you and other patients whose appointments are after yours.  Also, if you miss three or more appointments without notifying the office, you may be dismissed from the clinic at the provider's discretion.      For prescription refill requests, have your pharmacy contact our office and allow 72 hours for refills to be completed.    Today you received the following chemotherapy and/or immunotherapy agents Velcade injection.   To help prevent nausea and vomiting after your treatment, we encourage you to take your nausea medication as directed.  BELOW ARE SYMPTOMS THAT SHOULD BE REPORTED IMMEDIATELY: *FEVER GREATER THAN 100.4 F (38 C) OR HIGHER *CHILLS OR SWEATING *NAUSEA AND VOMITING THAT IS NOT CONTROLLED WITH YOUR NAUSEA MEDICATION *UNUSUAL SHORTNESS OF BREATH *UNUSUAL BRUISING OR BLEEDING *URINARY PROBLEMS (pain or burning when urinating, or frequent urination) *BOWEL PROBLEMS (unusual diarrhea, constipation, pain near the anus) TENDERNESS IN MOUTH AND THROAT WITH OR WITHOUT PRESENCE OF ULCERS (sore throat, sores in mouth, or a toothache) UNUSUAL RASH, SWELLING OR PAIN  UNUSUAL VAGINAL DISCHARGE OR ITCHING   Items with * indicate a potential emergency and should be followed up as soon as possible or go to the Emergency Department if any problems should occur.  Please show the CHEMOTHERAPY ALERT CARD or IMMUNOTHERAPY ALERT CARD at check-in to the Emergency  Department and triage nurse.  Should you have questions after your visit or need to cancel or reschedule your appointment, please contact [facility] [phone]  and follow the prompts.  Office hours are 8:00 a.m. to 4:30 p.m. Monday - Friday. Please note that voicemails left after 4:00 p.m. may not be returned until the following business day.  We are closed weekends and major holidays. You have access to a nurse at all times for urgent questions. Please call the main number to the clinic [phone] and follow the prompts.  For any non-urgent questions, you may also contact your provider using MyChart. We now offer e-Visits for anyone 18 and older to request care online for non-urgent symptoms. For details visit [email or web address].   Also download the MyChart app! Go to the app store, search "MyChart", open the app, select [place], and log in with your MyChart username and password.  Due to Covid, a mask is required upon entering the hospital/clinic. If you do not have a mask, one will be given to you upon arrival. For doctor visits, patients may have 1 support person aged 18 or older with them. For treatment visits, patients cannot have anyone with them due to current Covid guidelines and our immunocompromised population.   Bortezomib injection What is this medication? BORTEZOMIB (bor TEZ oh mib) targets proteins in cancer cells and stops the cancer cells from growing. It treats multiple myeloma and mantle cell lymphoma. This medicine may be used for other purposes; ask your health care provider or pharmacist if you have questions. COMMON BRAND NAME(S): Velcade What should I tell my care   team before I take this medication? They need to know if you have any of these conditions: dehydration diabetes (high blood sugar) heart disease liver disease tingling of the fingers or toes or other nerve disorder an unusual or allergic reaction to bortezomib, mannitol, boron, other  medicines, foods, dyes, or preservatives pregnant or trying to get pregnant breast-feeding How should I use this medication? This medicine is injected into a vein or under the skin. It is given by a health care provider in a hospital or clinic setting. Talk to your health care provider about the use of this medicine in children. Special care may be needed. Overdosage: If you think you have taken too much of this medicine contact a poison control center or emergency room at once. NOTE: This medicine is only for you. Do not share this medicine with others. What if I miss a dose? Keep appointments for follow-up doses. It is important not to miss your dose. Call your health care provider if you are unable to keep an appointment. What may interact with this medication? This medicine may interact with the following medications: ketoconazole rifampin This list may not describe all possible interactions. Give your health care provider a list of all the medicines, herbs, non-prescription drugs, or dietary supplements you use. Also tell them if you smoke, drink alcohol, or use illegal drugs. Some items may interact with your medicine. What should I watch for while using this medication? Your condition will be monitored carefully while you are receiving this medicine. You may need blood work done while you are taking this medicine. You may get drowsy or dizzy. Do not drive, use machinery, or do anything that needs mental alertness until you know how this medicine affects you. Do not stand up or sit up quickly, especially if you are an older patient. This reduces the risk of dizzy or fainting spells This medicine may increase your risk of getting an infection. Call your health care provider for advice if you get a fever, chills, sore throat, or other symptoms of a cold or flu. Do not treat yourself. Try to avoid being around people who are sick. Check with your health care provider if you have severe  diarrhea, nausea, and vomiting, or if you sweat a lot. The loss of too much body fluid may make it dangerous for you to take this medicine. Do not become pregnant while taking this medicine or for 7 months after stopping it. Women should inform their health care provider if they wish to become pregnant or think they might be pregnant. Men should not father a child while taking this medicine and for 4 months after stopping it. There is a potential for serious harm to an unborn child. Talk to your health care provider for more information. Do not breast-feed an infant while taking this medicine or for 2 months after stopping it. This medicine may make it more difficult to get pregnant or father a child. Talk to your health care provider if you are concerned about your fertility. What side effects may I notice from receiving this medication? Side effects that you should report to your doctor or health care professional as soon as possible: allergic reactions (skin rash; itching or hives; swelling of the face, lips, or tongue) bleeding (bloody or black, tarry stools; red or dark brown urine; spitting up blood or brown material that looks like coffee grounds; red spots on the skin; unusual bruising or bleeding from the eye, gums, or nose) blurred vision  or changes in vision confusion constipation headache heart failure (trouble breathing; fast, irregular heartbeat; sudden weight gain; swelling of the ankles, feet, hands) infection (fever, chills, cough, sore throat, pain or trouble passing urine) lack or loss of appetite liver injury (dark yellow or brown urine; general ill feeling or flu-like symptoms; loss of appetite, right upper belly pain; yellowing of the eyes or skin) low blood pressure (dizziness; feeling faint or lightheaded, falls; unusually weak or tired) muscle cramps pain, redness, or irritation at site where injected pain, tingling, numbness in the hands or feet seizures trouble  breathing unusual bruising or bleeding Side effects that usually do not require medical attention (report to your doctor or health care professional if they continue or are bothersome): diarrhea nausea stomach pain trouble sleeping vomiting This list may not describe all possible side effects. Call your doctor for medical advice about side effects. You may report side effects to FDA at 1-800-FDA-1088. Where should I keep my medication? This medicine is given in a hospital or clinic. It will not be stored at home. NOTE: This sheet is a summary. It may not cover all possible information. If you have questions about this medicine, talk to your doctor, pharmacist, or health care provider.  2023 Elsevier/Gold Standard (2020-06-22 00:00:00)  

## 2021-12-13 NOTE — Progress Notes (Signed)
Pt presents today for Velcade injection per provider's order. Labs and vital signs WNL for treatment. Okay to proceed with treatment today.  Velcade given today per MD orders. Tolerated infusion without adverse affects. Vital signs stable. No complaints at this time. Discharged from clinic via wheelchair in stable condition. Alert and oriented x 3. F/U with [facility] as scheduled.

## 2021-12-20 ENCOUNTER — Inpatient Hospital Stay (HOSPITAL_COMMUNITY): Payer: Medicare Other

## 2021-12-20 VITALS — BP 134/66 | HR 71 | Temp 97.8°F | Resp 18 | Wt 127.6 lb

## 2021-12-20 DIAGNOSIS — C9000 Multiple myeloma not having achieved remission: Secondary | ICD-10-CM

## 2021-12-20 DIAGNOSIS — Z5112 Encounter for antineoplastic immunotherapy: Secondary | ICD-10-CM | POA: Diagnosis not present

## 2021-12-20 LAB — CBC WITH DIFFERENTIAL/PLATELET
Abs Immature Granulocytes: 0.01 10*3/uL (ref 0.00–0.07)
Basophils Absolute: 0.1 10*3/uL (ref 0.0–0.1)
Basophils Relative: 1 %
Eosinophils Absolute: 0.2 10*3/uL (ref 0.0–0.5)
Eosinophils Relative: 3 %
HCT: 36 % (ref 36.0–46.0)
Hemoglobin: 11.5 g/dL — ABNORMAL LOW (ref 12.0–15.0)
Immature Granulocytes: 0 %
Lymphocytes Relative: 30 %
Lymphs Abs: 1.8 10*3/uL (ref 0.7–4.0)
MCH: 27.7 pg (ref 26.0–34.0)
MCHC: 31.9 g/dL (ref 30.0–36.0)
MCV: 86.7 fL (ref 80.0–100.0)
Monocytes Absolute: 0.9 10*3/uL (ref 0.1–1.0)
Monocytes Relative: 14 %
Neutro Abs: 3.2 10*3/uL (ref 1.7–7.7)
Neutrophils Relative %: 52 %
Platelets: 326 10*3/uL (ref 150–400)
RBC: 4.15 MIL/uL (ref 3.87–5.11)
RDW: 18 % — ABNORMAL HIGH (ref 11.5–15.5)
WBC: 6.1 10*3/uL (ref 4.0–10.5)
nRBC: 0 % (ref 0.0–0.2)

## 2021-12-20 LAB — COMPREHENSIVE METABOLIC PANEL
ALT: 17 U/L (ref 0–44)
AST: 20 U/L (ref 15–41)
Albumin: 3.9 g/dL (ref 3.5–5.0)
Alkaline Phosphatase: 59 U/L (ref 38–126)
Anion gap: 9 (ref 5–15)
BUN: 16 mg/dL (ref 8–23)
CO2: 26 mmol/L (ref 22–32)
Calcium: 9.1 mg/dL (ref 8.9–10.3)
Chloride: 101 mmol/L (ref 98–111)
Creatinine, Ser: 0.76 mg/dL (ref 0.44–1.00)
GFR, Estimated: >60 mL/min (ref >60)
Glucose, Bld: 87 mg/dL (ref 70–99)
Potassium: 4.4 mmol/L (ref 3.5–5.1)
Sodium: 136 mmol/L (ref 135–145)
Total Bilirubin: 0.6 mg/dL (ref 0.3–1.2)
Total Protein: 6.4 g/dL — ABNORMAL LOW (ref 6.5–8.1)

## 2021-12-20 LAB — MAGNESIUM: Magnesium: 1.9 mg/dL (ref 1.7–2.4)

## 2021-12-20 MED ORDER — DEXAMETHASONE 4 MG PO TABS
20.0000 mg | ORAL_TABLET | Freq: Once | ORAL | Status: AC
  Administered 2021-12-20: 20 mg via ORAL
  Filled 2021-12-20: qty 5

## 2021-12-20 MED ORDER — DARATUMUMAB-HYALURONIDASE-FIHJ 1800-30000 MG-UT/15ML ~~LOC~~ SOLN
1800.0000 mg | Freq: Once | SUBCUTANEOUS | Status: AC
  Administered 2021-12-20: 1800 mg via SUBCUTANEOUS
  Filled 2021-12-20: qty 15

## 2021-12-20 MED ORDER — BORTEZOMIB CHEMO SQ INJECTION 3.5 MG (2.5MG/ML)
1.3000 mg/m2 | Freq: Once | INTRAMUSCULAR | Status: AC
  Administered 2021-12-20: 2.25 mg via SUBCUTANEOUS
  Filled 2021-12-20: qty 0.9

## 2021-12-20 MED ORDER — PROCHLORPERAZINE MALEATE 10 MG PO TABS
10.0000 mg | ORAL_TABLET | Freq: Once | ORAL | Status: AC
  Administered 2021-12-20: 10 mg via ORAL
  Filled 2021-12-20: qty 1

## 2021-12-20 MED ORDER — ACETAMINOPHEN 325 MG PO TABS
650.0000 mg | ORAL_TABLET | Freq: Once | ORAL | Status: AC
  Administered 2021-12-20: 650 mg via ORAL
  Filled 2021-12-20: qty 2

## 2021-12-20 MED ORDER — DIPHENHYDRAMINE HCL 25 MG PO CAPS
50.0000 mg | ORAL_CAPSULE | Freq: Once | ORAL | Status: AC
  Administered 2021-12-20: 50 mg via ORAL
  Filled 2021-12-20: qty 2

## 2021-12-20 NOTE — Patient Instructions (Signed)
[place]  Discharge Instructions: Thank you for choosing [facility] to provide your oncology and hematology care.  If you have a lab appointment with the [facility], please come in thru the Main Entrance and check in at the main information desk.  Wear comfortable clothing and clothing appropriate for easy access to any Portacath or PICC line.   We strive to give you quality time with your provider. You may need to reschedule your appointment if you arrive late (15 or more minutes).  Arriving late affects you and other patients whose appointments are after yours.  Also, if you miss three or more appointments without notifying the office, you may be dismissed from the clinic at the provider's discretion.      For prescription refill requests, have your pharmacy contact our office and allow 72 hours for refills to be completed.    Daratumumab; Hyaluronidase Injection What is this medication? DARATUMUMAB; HYALURONIDASE (dar a toom ue mab / hye al ur ON i dase) is a monoclonal antibody. Hyaluronidase is used to improve the effects of daratumumab. It treats certain types of cancer. Some of the cancers treated are multiple myeloma and light-chain amyloidosis. This medicine may be used for other purposes; ask your health care provider or pharmacist if you have questions. COMMON BRAND NAME(S): DARZALEX FASPRO What should I tell my care team before I take this medication? They need to know if you have any of these conditions: heart disease infection especially a viral infection such as chickenpox, cold sores, herpes, or hepatitis B lung or breathing disease an unusual or allergic reaction to daratumumab, hyaluronidase, other medicines, foods, dyes, or preservatives pregnant or trying to get pregnant breast-feeding How should I use this medication? This medicine is for injection under the skin. It is given by a health care professional in a hospital or clinic  setting. Talk to your pediatrician regarding the use of this medicine in children. Special care may be needed. Overdosage: If you think you have taken too much of this medicine contact a poison control center or emergency room at once. NOTE: This medicine is only for you. Do not share this medicine with others. What if I miss a dose? Keep appointments for follow-up doses as directed. It is important not to miss your dose. Call your doctor or health care professional if you are unable to keep an appointment. What may interact with this medication? Interactions have not been studied. This list may not describe all possible interactions. Give your health care provider a list of all the medicines, herbs, non-prescription drugs, or dietary supplements you use. Also tell them if you smoke, drink alcohol, or use illegal drugs. Some items may interact with your medicine. What should I watch for while using this medication? Your condition will be monitored carefully while you are receiving this medicine. This medicine can cause serious allergic reactions. To reduce your risk, your health care provider may give you other medicine to take before receiving this one. Be sure to follow the directions from your health care provider. This medicine can affect the results of blood tests to match your blood type. These changes can last for up to 6 months after the final dose. Your healthcare provider will do blood tests to match your blood type before you start treatment. Tell all of your healthcare providers that you are being treated with this medicine before receiving a blood transfusion. This medicine can affect the results of some tests used to  determine treatment response; extra tests may be needed to evaluate response. Do not become pregnant while taking this medicine or for 3 months after stopping it. Women should inform their health care provider if they wish to become pregnant or think they might be pregnant.  There is a potential for serious side effects to an unborn child. Talk to your health care provider for more information. Do not breast-feed an infant while taking this medicine. What side effects may I notice from receiving this medication? Side effects that you should report to your care team as soon as possible: Allergic reactions--skin rash, itching or hives, swelling of the face, lips, or tongue Blood clot--chest pain, shortness of breath, pain, swelling or warmth in the leg Blurred vision Fast, irregular heartbeat Infection--fever, chills, cough, sore throat, pain or trouble passing urine Injection reactions--dizziness, fast heartbeat, feeling faint or lightheaded, falls, headache, increase in blood pressure, nausea, vomiting, or wheezing or trouble breathing with loud or whistling sounds Low red blood cell counts--trouble breathing, feeling faint, lightheaded or falls, unusually weak or tired Unusual bleeding or bruising Side effects that usually do not require medical attention (report these to your care team if they continue or are bothersome): Back pain Constipation Diarrhea Pain, tingling, numbness in the hands or feet Pain, redness, or irritation at site where injected Muscle cramp or pain Swelling of the ankles, feet, hands Tiredness Trouble sleeping This list may not describe all possible side effects. Call your doctor for medical advice about side effects. You may report side effects to FDA at 1-800-FDA-1088. Where should I keep my medication? This drug is given in a hospital or clinic and will not be stored at home. NOTE: This sheet is a summary. It may not cover all possible information. If you have questions about this medicine, talk to your doctor, pharmacist, or health care provider.  2023 Elsevier/Gold Standard (2021-06-01 00:00:00)   Bortezomib injection What is this medication? BORTEZOMIB (bor TEZ oh mib) targets proteins in cancer cells and stops the cancer  cells from growing. It treats multiple myeloma and mantle cell lymphoma. This medicine may be used for other purposes; ask your health care provider or pharmacist if you have questions. COMMON BRAND NAME(S): Velcade What should I tell my care team before I take this medication? They need to know if you have any of these conditions: dehydration diabetes (high blood sugar) heart disease liver disease tingling of the fingers or toes or other nerve disorder an unusual or allergic reaction to bortezomib, mannitol, boron, other medicines, foods, dyes, or preservatives pregnant or trying to get pregnant breast-feeding How should I use this medication? This medicine is injected into a vein or under the skin. It is given by a health care provider in a hospital or clinic setting. Talk to your health care provider about the use of this medicine in children. Special care may be needed. Overdosage: If you think you have taken too much of this medicine contact a poison control center or emergency room at once. NOTE: This medicine is only for you. Do not share this medicine with others. What if I miss a dose? Keep appointments for follow-up doses. It is important not to miss your dose. Call your health care provider if you are unable to keep an appointment. What may interact with this medication? This medicine may interact with the following medications: ketoconazole rifampin This list may not describe all possible interactions. Give your health care provider a list of all the medicines, herbs, non-prescription  drugs, or dietary supplements you use. Also tell them if you smoke, drink alcohol, or use illegal drugs. Some items may interact with your medicine. What should I watch for while using this medication? Your condition will be monitored carefully while you are receiving this medicine. You may need blood work done while you are taking this medicine. You may get drowsy or dizzy. Do not drive, use  machinery, or do anything that needs mental alertness until you know how this medicine affects you. Do not stand up or sit up quickly, especially if you are an older patient. This reduces the risk of dizzy or fainting spells This medicine may increase your risk of getting an infection. Call your health care provider for advice if you get a fever, chills, sore throat, or other symptoms of a cold or flu. Do not treat yourself. Try to avoid being around people who are sick. Check with your health care provider if you have severe diarrhea, nausea, and vomiting, or if you sweat a lot. The loss of too much body fluid may make it dangerous for you to take this medicine. Do not become pregnant while taking this medicine or for 7 months after stopping it. Women should inform their health care provider if they wish to become pregnant or think they might be pregnant. Men should not father a child while taking this medicine and for 4 months after stopping it. There is a potential for serious harm to an unborn child. Talk to your health care provider for more information. Do not breast-feed an infant while taking this medicine or for 2 months after stopping it. This medicine may make it more difficult to get pregnant or father a child. Talk to your health care provider if you are concerned about your fertility. What side effects may I notice from receiving this medication? Side effects that you should report to your doctor or health care professional as soon as possible: allergic reactions (skin rash; itching or hives; swelling of the face, lips, or tongue) bleeding (bloody or black, tarry stools; red or dark Arion Shankles urine; spitting up blood or Alantis Bethune material that looks like coffee grounds; red spots on the skin; unusual bruising or bleeding from the eye, gums, or nose) blurred vision or changes in vision confusion constipation headache heart failure (trouble breathing; fast, irregular heartbeat; sudden weight gain;  swelling of the ankles, feet, hands) infection (fever, chills, cough, sore throat, pain or trouble passing urine) lack or loss of appetite liver injury (dark yellow or Kenecia Barren urine; general ill feeling or flu-like symptoms; loss of appetite, right upper belly pain; yellowing of the eyes or skin) low blood pressure (dizziness; feeling faint or lightheaded, falls; unusually weak or tired) muscle cramps pain, redness, or irritation at site where injected pain, tingling, numbness in the hands or feet seizures trouble breathing unusual bruising or bleeding Side effects that usually do not require medical attention (report to your doctor or health care professional if they continue or are bothersome): diarrhea nausea stomach pain trouble sleeping vomiting This list may not describe all possible side effects. Call your doctor for medical advice about side effects. You may report side effects to FDA at 1-800-FDA-1088. Where should I keep my medication? This medicine is given in a hospital or clinic. It will not be stored at home. NOTE: This sheet is a summary. It may not cover all possible information. If you have questions about this medicine, talk to your doctor, pharmacist, or health care provider.  2023  Elsevier/Gold Standard (2020-06-22 00:00:00)    To help prevent nausea and vomiting after your treatment, we encourage you to take your nausea medication as directed.  BELOW ARE SYMPTOMS THAT SHOULD BE REPORTED IMMEDIATELY: *FEVER GREATER THAN 100.4 F (38 C) OR HIGHER *CHILLS OR SWEATING *NAUSEA AND VOMITING THAT IS NOT CONTROLLED WITH YOUR NAUSEA MEDICATION *UNUSUAL SHORTNESS OF BREATH *UNUSUAL BRUISING OR BLEEDING *URINARY PROBLEMS (pain or burning when urinating, or frequent urination) *BOWEL PROBLEMS (unusual diarrhea, constipation, pain near the anus) TENDERNESS IN MOUTH AND THROAT WITH OR WITHOUT PRESENCE OF ULCERS (sore throat, sores in mouth, or a toothache) UNUSUAL RASH,  SWELLING OR PAIN  UNUSUAL VAGINAL DISCHARGE OR ITCHING   Items with * indicate a potential emergency and should be followed up as soon as possible or go to the Emergency Department if any problems should occur.  Please show the CHEMOTHERAPY ALERT CARD or IMMUNOTHERAPY ALERT CARD at check-in to the Emergency Department and triage nurse.  Should you have questions after your visit or need to cancel or reschedule your appointment, please contact [facility] [phone]  and follow the prompts.  Office hours are 8:00 a.m. to 4:30 p.m. Monday - Friday. Please note that voicemails left after 4:00 p.m. may not be returned until the following business day.  We are closed weekends and major holidays. You have access to a nurse at all times for urgent questions. Please call the main number to the clinic [phone] and follow the prompts.  For any non-urgent questions, you may also contact your provider using MyChart. We now offer e-Visits for anyone 23 and older to request care online for non-urgent symptoms. For details visit [email or web address].   Also download the MyChart app! Go to the app store, search "MyChart", open the app, select [place], and log in with your MyChart username and password.  Due to Covid, a mask is required upon entering the hospital/clinic. If you do not have a mask, one will be given to you upon arrival. For doctor visits, patients may have 1 support person aged 63 or older with them. For treatment visits, patients cannot have anyone with them due to current Covid guidelines and our immunocompromised population.

## 2021-12-20 NOTE — Progress Notes (Signed)
Patient presents today for chemotherapy injections.  Patient is in satisfactory condition with no new complaints voiced.  Vital signs are stable.  Labs reviewed and all labs are within treatment parameters.  We will proceed with injections per MD orders.  Patient tolerated injections with no complaints voiced.  Site clean and dry with no bruising or swelling noted.  No complaints of pain.  See MAR for injection details.  Discharged via wheelchair with husband with vital signs stable and no signs or symptoms of distress noted.

## 2021-12-27 ENCOUNTER — Inpatient Hospital Stay (HOSPITAL_COMMUNITY): Payer: Medicare Other

## 2021-12-27 VITALS — BP 128/72 | HR 75 | Temp 97.6°F | Resp 16 | Wt 128.6 lb

## 2021-12-27 DIAGNOSIS — C9000 Multiple myeloma not having achieved remission: Secondary | ICD-10-CM

## 2021-12-27 DIAGNOSIS — Z5112 Encounter for antineoplastic immunotherapy: Secondary | ICD-10-CM | POA: Diagnosis not present

## 2021-12-27 LAB — COMPREHENSIVE METABOLIC PANEL
ALT: 20 U/L (ref 0–44)
AST: 24 U/L (ref 15–41)
Albumin: 4.1 g/dL (ref 3.5–5.0)
Alkaline Phosphatase: 64 U/L (ref 38–126)
Anion gap: 7 (ref 5–15)
BUN: 14 mg/dL (ref 8–23)
CO2: 27 mmol/L (ref 22–32)
Calcium: 9.2 mg/dL (ref 8.9–10.3)
Chloride: 100 mmol/L (ref 98–111)
Creatinine, Ser: 0.79 mg/dL (ref 0.44–1.00)
GFR, Estimated: >60 mL/min (ref >60)
Glucose, Bld: 108 mg/dL — ABNORMAL HIGH (ref 70–99)
Potassium: 4.5 mmol/L (ref 3.5–5.1)
Sodium: 134 mmol/L — ABNORMAL LOW (ref 135–145)
Total Bilirubin: 0.8 mg/dL (ref 0.3–1.2)
Total Protein: 6.9 g/dL (ref 6.5–8.1)

## 2021-12-27 LAB — CBC WITH DIFFERENTIAL/PLATELET
Abs Immature Granulocytes: 0.01 10*3/uL (ref 0.00–0.07)
Basophils Absolute: 0.1 10*3/uL (ref 0.0–0.1)
Basophils Relative: 1 %
Eosinophils Absolute: 0.3 10*3/uL (ref 0.0–0.5)
Eosinophils Relative: 4 %
HCT: 35.4 % — ABNORMAL LOW (ref 36.0–46.0)
Hemoglobin: 11.2 g/dL — ABNORMAL LOW (ref 12.0–15.0)
Immature Granulocytes: 0 %
Lymphocytes Relative: 30 %
Lymphs Abs: 2 10*3/uL (ref 0.7–4.0)
MCH: 27.9 pg (ref 26.0–34.0)
MCHC: 31.6 g/dL (ref 30.0–36.0)
MCV: 88.3 fL (ref 80.0–100.0)
Monocytes Absolute: 0.7 10*3/uL (ref 0.1–1.0)
Monocytes Relative: 11 %
Neutro Abs: 3.5 10*3/uL (ref 1.7–7.7)
Neutrophils Relative %: 54 %
Platelets: 326 10*3/uL (ref 150–400)
RBC: 4.01 MIL/uL (ref 3.87–5.11)
RDW: 18.7 % — ABNORMAL HIGH (ref 11.5–15.5)
WBC: 6.6 10*3/uL (ref 4.0–10.5)
nRBC: 0 % (ref 0.0–0.2)

## 2021-12-27 LAB — LACTATE DEHYDROGENASE: LDH: 154 U/L (ref 98–192)

## 2021-12-27 LAB — MAGNESIUM: Magnesium: 1.8 mg/dL (ref 1.7–2.4)

## 2021-12-27 MED ORDER — BORTEZOMIB CHEMO SQ INJECTION 3.5 MG (2.5MG/ML): 1.3000 mg/m2 | Freq: Once | INTRAMUSCULAR | Status: DC

## 2021-12-27 MED ORDER — BORTEZOMIB CHEMO SQ INJECTION 3.5 MG (2.5MG/ML)
1.3000 mg/m2 | Freq: Once | INTRAMUSCULAR | Status: AC
  Administered 2021-12-27: 2.25 mg via SUBCUTANEOUS
  Filled 2021-12-27: qty 0.9

## 2021-12-27 MED ORDER — PROCHLORPERAZINE MALEATE 10 MG PO TABS: 10.0000 mg | ORAL_TABLET | Freq: Once | ORAL | Status: DC

## 2021-12-27 NOTE — Progress Notes (Signed)
Patient presents today for Velcade injection per providers order.  Vital signs and labs within parameters for treatment.  Stable during administration without incident; injection site WNL; see MAR for injection details.  Patient tolerated procedure well and without incident.  No questions or complaints noted at this time.  

## 2021-12-27 NOTE — Patient Instructions (Signed)
[facility]  Discharge Instructions: Thank you for choosing [facility] to provide your oncology and hematology care.  If you have a lab appointment with the Cancer Center, please come in thru the Main Entrance and check in at the main information desk.  Wear comfortable clothing and clothing appropriate for easy access to any Portacath or PICC line.   We strive to give you quality time with your provider. You may need to reschedule your appointment if you arrive late (15 or more minutes).  Arriving late affects you and other patients whose appointments are after yours.  Also, if you miss three or more appointments without notifying the office, you may be dismissed from the clinic at the provider's discretion.      For prescription refill requests, have your pharmacy contact our office and allow 72 hours for refills to be completed.    Today you received the following chemotherapy and/or immunotherapy agents Velcade      To help prevent nausea and vomiting after your treatment, we encourage you to take your nausea medication as directed.  BELOW ARE SYMPTOMS THAT SHOULD BE REPORTED IMMEDIATELY: *FEVER GREATER THAN 100.4 F (38 C) OR HIGHER *CHILLS OR SWEATING *NAUSEA AND VOMITING THAT IS NOT CONTROLLED WITH YOUR NAUSEA MEDICATION *UNUSUAL SHORTNESS OF BREATH *UNUSUAL BRUISING OR BLEEDING *URINARY PROBLEMS (pain or burning when urinating, or frequent urination) *BOWEL PROBLEMS (unusual diarrhea, constipation, pain near the anus) TENDERNESS IN MOUTH AND THROAT WITH OR WITHOUT PRESENCE OF ULCERS (sore throat, sores in mouth, or a toothache) UNUSUAL RASH, SWELLING OR PAIN  UNUSUAL VAGINAL DISCHARGE OR ITCHING   Items with * indicate a potential emergency and should be followed up as soon as possible or go to the Emergency Department if any problems should occur.  Please show the CHEMOTHERAPY ALERT CARD or IMMUNOTHERAPY ALERT CARD at check-in to the Emergency  Department and triage nurse.  Should you have questions after your visit or need to cancel or reschedule your appointment, please contact  CANCER CENTER [phone]  and follow the prompts.  Office hours are 8:00 a.m. to 4:30 p.m. Monday - Friday. Please note that voicemails left after 4:00 p.m. may not be returned until the following business day.  We are closed weekends and major holidays. You have access to a nurse at all times for urgent questions. Please call the main number to the clinic [phone] and follow the prompts.  For any non-urgent questions, you may also contact your provider using MyChart. We now offer e-Visits for anyone 18 and older to request care online for non-urgent symptoms. For details visit [email or web address].   Also download the MyChart app! Go to the app store, search "MyChart", open the app, select [facility], and log in with your MyChart username and password.  Masks are optional in the cancer centers. If you would like for your care team to wear a mask while they are taking care of you, please let them know. For doctor visits, patients may have with them one support person who is at least 72 years old. At this time, visitors are not allowed in the infusion area.  

## 2021-12-28 LAB — KAPPA/LAMBDA LIGHT CHAINS
Kappa free light chain: 10 mg/L (ref 3.3–19.4)
Kappa, lambda light chain ratio: 2.5 — ABNORMAL HIGH (ref 0.26–1.65)
Lambda free light chains: 4 mg/L — ABNORMAL LOW (ref 5.7–26.3)

## 2021-12-30 ENCOUNTER — Other Ambulatory Visit (HOSPITAL_COMMUNITY): Payer: Self-pay | Admitting: Hematology

## 2021-12-31 ENCOUNTER — Encounter (HOSPITAL_COMMUNITY): Payer: Self-pay | Admitting: Hematology

## 2021-12-31 LAB — PROTEIN ELECTROPHORESIS, SERUM
A/G Ratio: 1.6 (ref 0.7–1.7)
Albumin ELP: 3.9 g/dL (ref 2.9–4.4)
Alpha-1-Globulin: 0.3 g/dL (ref 0.0–0.4)
Alpha-2-Globulin: 0.7 g/dL (ref 0.4–1.0)
Beta Globulin: 1 g/dL (ref 0.7–1.3)
Gamma Globulin: 0.6 g/dL (ref 0.4–1.8)
Globulin, Total: 2.5 g/dL (ref 2.2–3.9)
M-Spike, %: 0.2 g/dL — ABNORMAL HIGH
Total Protein ELP: 6.4 g/dL (ref 6.0–8.5)

## 2022-01-02 ENCOUNTER — Other Ambulatory Visit (HOSPITAL_COMMUNITY): Payer: Self-pay | Admitting: Hematology

## 2022-01-03 ENCOUNTER — Inpatient Hospital Stay (HOSPITAL_COMMUNITY): Payer: Medicare Other

## 2022-01-03 ENCOUNTER — Inpatient Hospital Stay (HOSPITAL_BASED_OUTPATIENT_CLINIC_OR_DEPARTMENT_OTHER): Payer: Medicare Other | Admitting: Hematology

## 2022-01-03 VITALS — BP 129/83 | HR 78 | Temp 97.9°F | Resp 16 | Ht 61.0 in | Wt 126.8 lb

## 2022-01-03 VITALS — BP 128/63 | HR 73 | Temp 97.4°F | Resp 16

## 2022-01-03 DIAGNOSIS — M546 Pain in thoracic spine: Secondary | ICD-10-CM | POA: Diagnosis not present

## 2022-01-03 DIAGNOSIS — C9000 Multiple myeloma not having achieved remission: Secondary | ICD-10-CM

## 2022-01-03 DIAGNOSIS — M899 Disorder of bone, unspecified: Secondary | ICD-10-CM | POA: Diagnosis not present

## 2022-01-03 DIAGNOSIS — Z5112 Encounter for antineoplastic immunotherapy: Secondary | ICD-10-CM | POA: Diagnosis not present

## 2022-01-03 LAB — COMPREHENSIVE METABOLIC PANEL
ALT: 21 U/L (ref 0–44)
AST: 24 U/L (ref 15–41)
Albumin: 4.4 g/dL (ref 3.5–5.0)
Alkaline Phosphatase: 68 U/L (ref 38–126)
Anion gap: 9 (ref 5–15)
BUN: 15 mg/dL (ref 8–23)
CO2: 27 mmol/L (ref 22–32)
Calcium: 9.4 mg/dL (ref 8.9–10.3)
Chloride: 98 mmol/L (ref 98–111)
Creatinine, Ser: 0.73 mg/dL (ref 0.44–1.00)
GFR, Estimated: >60 mL/min (ref >60)
Glucose, Bld: 80 mg/dL (ref 70–99)
Potassium: 3.8 mmol/L (ref 3.5–5.1)
Sodium: 134 mmol/L — ABNORMAL LOW (ref 135–145)
Total Bilirubin: 0.5 mg/dL (ref 0.3–1.2)
Total Protein: 7.6 g/dL (ref 6.5–8.1)

## 2022-01-03 LAB — CBC WITH DIFFERENTIAL/PLATELET
Abs Immature Granulocytes: 0.03 10*3/uL (ref 0.00–0.07)
Basophils Absolute: 0.1 10*3/uL (ref 0.0–0.1)
Basophils Relative: 1 %
Eosinophils Absolute: 0.3 10*3/uL (ref 0.0–0.5)
Eosinophils Relative: 4 %
HCT: 36.4 % (ref 36.0–46.0)
Hemoglobin: 11.5 g/dL — ABNORMAL LOW (ref 12.0–15.0)
Immature Granulocytes: 0 %
Lymphocytes Relative: 18 %
Lymphs Abs: 1.6 10*3/uL (ref 0.7–4.0)
MCH: 27.6 pg (ref 26.0–34.0)
MCHC: 31.6 g/dL (ref 30.0–36.0)
MCV: 87.3 fL (ref 80.0–100.0)
Monocytes Absolute: 0.5 10*3/uL (ref 0.1–1.0)
Monocytes Relative: 6 %
Neutro Abs: 6.2 10*3/uL (ref 1.7–7.7)
Neutrophils Relative %: 71 %
Platelets: 285 10*3/uL (ref 150–400)
RBC: 4.17 MIL/uL (ref 3.87–5.11)
RDW: 18.4 % — ABNORMAL HIGH (ref 11.5–15.5)
WBC: 8.6 10*3/uL (ref 4.0–10.5)
nRBC: 0 % (ref 0.0–0.2)

## 2022-01-03 LAB — MAGNESIUM: Magnesium: 2 mg/dL (ref 1.7–2.4)

## 2022-01-03 MED ORDER — DEXAMETHASONE 4 MG PO TABS
20.0000 mg | ORAL_TABLET | Freq: Once | ORAL | Status: AC
  Administered 2022-01-03: 20 mg via ORAL
  Filled 2022-01-03: qty 5

## 2022-01-03 MED ORDER — BORTEZOMIB CHEMO SQ INJECTION 3.5 MG (2.5MG/ML)
1.3000 mg/m2 | Freq: Once | INTRAMUSCULAR | Status: AC
  Administered 2022-01-03: 2.25 mg via SUBCUTANEOUS
  Filled 2022-01-03: qty 0.9

## 2022-01-03 MED ORDER — ACETAMINOPHEN 325 MG PO TABS
650.0000 mg | ORAL_TABLET | Freq: Once | ORAL | Status: AC
  Administered 2022-01-03: 650 mg via ORAL
  Filled 2022-01-03: qty 2

## 2022-01-03 MED ORDER — DIPHENHYDRAMINE HCL 25 MG PO CAPS
50.0000 mg | ORAL_CAPSULE | Freq: Once | ORAL | Status: AC
  Administered 2022-01-03: 50 mg via ORAL
  Filled 2022-01-03: qty 2

## 2022-01-03 MED ORDER — DARATUMUMAB-HYALURONIDASE-FIHJ 1800-30000 MG-UT/15ML ~~LOC~~ SOLN
1800.0000 mg | Freq: Once | SUBCUTANEOUS | Status: AC
  Administered 2022-01-03: 1800 mg via SUBCUTANEOUS
  Filled 2022-01-03: qty 15

## 2022-01-03 NOTE — Patient Instructions (Addendum)
[facility] Discharge Instructions   You were seen and examined today by Dr. Katragadda.  He reviewed the results of your lab work which are normal/stable.   We will proceed with your treatment today.  Return as scheduled.    Thank you for choosing [facility] at [facility] to provide your oncology and hematology care.  To afford each patient quality time with our provider, please arrive at least 15 minutes before your scheduled appointment time.   If you have a lab appointment with the Cancer Center please come in thru the Main Entrance and check in at the main information desk.  You need to re-schedule your appointment should you arrive 10 or more minutes late.  We strive to give you quality time with our providers, and arriving late affects you and other patients whose appointments are after yours.  Also, if you no show three or more times for appointments you may be dismissed from the clinic at the providers discretion.     Again, thank you for choosing [facility].  Our hope is that these requests will decrease the amount of time that you wait before being seen by our physicians.       _____________________________________________________________  Should you have questions after your visit to  Cancer Center, please contact our office at [phone] and follow the prompts.  Our office hours are 8:00 a.m. and 4:30 p.m. Monday - Friday.  Please note that voicemails left after 4:00 p.m. may not be returned until the following business day.  We are closed weekends and major holidays.  You do have access to a nurse 24-7, just call the main number to the clinic [phone] and do not press any options, hold on the line and a nurse will answer the phone.    For prescription refill requests, have your pharmacy contact our office and allow 72 hours.    Due to Covid, you will need to wear a mask upon entering  the hospital. If you do not have a mask, a mask will be given to you at the Main Entrance upon arrival. For doctor visits, patients may have 1 support person age 18 or older with them. For treatment visits, patients can not have anyone with them due to social distancing guidelines and our immunocompromised population.      

## 2022-01-10 ENCOUNTER — Other Ambulatory Visit (HOSPITAL_COMMUNITY): Payer: Self-pay

## 2022-01-10 ENCOUNTER — Inpatient Hospital Stay (HOSPITAL_COMMUNITY): Payer: Medicare Other

## 2022-01-10 VITALS — BP 129/63 | HR 62 | Temp 97.8°F | Resp 18 | Wt 123.6 lb

## 2022-01-10 DIAGNOSIS — C9000 Multiple myeloma not having achieved remission: Secondary | ICD-10-CM

## 2022-01-10 DIAGNOSIS — Z5112 Encounter for antineoplastic immunotherapy: Secondary | ICD-10-CM | POA: Diagnosis not present

## 2022-01-10 LAB — CBC WITH DIFFERENTIAL/PLATELET
Abs Immature Granulocytes: 0.02 10*3/uL (ref 0.00–0.07)
Basophils Absolute: 0.1 10*3/uL (ref 0.0–0.1)
Basophils Relative: 1 %
Eosinophils Absolute: 0.3 10*3/uL (ref 0.0–0.5)
Eosinophils Relative: 5 %
HCT: 35.6 % — ABNORMAL LOW (ref 36.0–46.0)
Hemoglobin: 11.2 g/dL — ABNORMAL LOW (ref 12.0–15.0)
Immature Granulocytes: 0 %
Lymphocytes Relative: 22 %
Lymphs Abs: 1.4 10*3/uL (ref 0.7–4.0)
MCH: 28.4 pg (ref 26.0–34.0)
MCHC: 31.5 g/dL (ref 30.0–36.0)
MCV: 90.1 fL (ref 80.0–100.0)
Monocytes Absolute: 1.2 10*3/uL — ABNORMAL HIGH (ref 0.1–1.0)
Monocytes Relative: 19 %
Neutro Abs: 3.4 10*3/uL (ref 1.7–7.7)
Neutrophils Relative %: 53 %
RBC: 3.95 MIL/uL (ref 3.87–5.11)
RDW: 18 % — ABNORMAL HIGH (ref 11.5–15.5)
WBC: 6.4 10*3/uL (ref 4.0–10.5)
nRBC: 0 % (ref 0.0–0.2)

## 2022-01-10 LAB — COMPREHENSIVE METABOLIC PANEL
ALT: 19 U/L (ref 0–44)
AST: 20 U/L (ref 15–41)
Albumin: 3.9 g/dL (ref 3.5–5.0)
Alkaline Phosphatase: 56 U/L (ref 38–126)
Anion gap: 6 (ref 5–15)
BUN: 13 mg/dL (ref 8–23)
CO2: 26 mmol/L (ref 22–32)
Calcium: 8.8 mg/dL — ABNORMAL LOW (ref 8.9–10.3)
Chloride: 102 mmol/L (ref 98–111)
Creatinine, Ser: 0.76 mg/dL (ref 0.44–1.00)
GFR, Estimated: >60 mL/min (ref >60)
Glucose, Bld: 92 mg/dL (ref 70–99)
Potassium: 3.8 mmol/L (ref 3.5–5.1)
Sodium: 134 mmol/L — ABNORMAL LOW (ref 135–145)
Total Bilirubin: 0.7 mg/dL (ref 0.3–1.2)
Total Protein: 6.4 g/dL — ABNORMAL LOW (ref 6.5–8.1)

## 2022-01-10 LAB — MAGNESIUM: Magnesium: 1.9 mg/dL (ref 1.7–2.4)

## 2022-01-10 MED ORDER — ZOLEDRONIC ACID 4 MG/100ML IV SOLN
4.0000 mg | Freq: Once | INTRAVENOUS | Status: AC
  Administered 2022-01-10: 4 mg via INTRAVENOUS
  Filled 2022-01-10: qty 100

## 2022-01-10 MED ORDER — BORTEZOMIB CHEMO SQ INJECTION 3.5 MG (2.5MG/ML)
1.3000 mg/m2 | Freq: Once | INTRAMUSCULAR | Status: AC
  Administered 2022-01-10: 2.25 mg via SUBCUTANEOUS
  Filled 2022-01-10: qty 0.9

## 2022-01-10 MED ORDER — PROCHLORPERAZINE MALEATE 10 MG PO TABS
10.0000 mg | ORAL_TABLET | Freq: Once | ORAL | Status: AC
  Administered 2022-01-10: 10 mg via ORAL
  Filled 2022-01-10: qty 1

## 2022-01-10 MED ORDER — SODIUM CHLORIDE 0.9 % IV SOLN: INTRAVENOUS | Status: DC

## 2022-01-10 MED ORDER — LENALIDOMIDE 20 MG PO CAPS: 20.0000 mg | ORAL_CAPSULE | Freq: Every day | ORAL | 0 refills | Status: DC

## 2022-01-10 NOTE — Progress Notes (Signed)
Pt presents today for Velcade and 4 mg Zometa IV per provider's order. Labs and vital WNL for treatment. Okay to proceed with treatment today.  Peripheral IV started with good blood return pre and post infusion.  Velcade and '4mg'$  Zometa IV  given today per MD orders. Tolerated infusion without adverse affects. Vital signs stable. No complaints at this time. Discharged from clinic via wheelchair in stable condition. Alert and oriented x 3. F/U with [facility] as scheduled.

## 2022-01-10 NOTE — Progress Notes (Signed)
Maintain velcade dose at 2.25 mg despite small weight loss. Right at 10%.  Henreitta Leber, PharmD

## 2022-01-10 NOTE — Telephone Encounter (Signed)
Chart reviewed. Revlimid refilled per last office note with Dr. Katragadda.  

## 2022-01-10 NOTE — Patient Instructions (Signed)
[facility]  Discharge Instructions: Thank you for choosing [facility] to provide your oncology and hematology care.  If you have a lab appointment with the [facility], please come in thru the Main Entrance and check in at the main information desk.  Wear comfortable clothing and clothing appropriate for easy access to any Portacath or PICC line.   We strive to give you quality time with your provider. You may need to reschedule your appointment if you arrive late (15 or more minutes).  Arriving late affects you and other patients whose appointments are after yours.  Also, if you miss three or more appointments without notifying the office, you may be dismissed from the clinic at the provider's discretion.      For prescription refill requests, have your pharmacy contact our office and allow 72 hours for refills to be completed.    Today you received the following chemotherapy and/or immunotherapy agents Velcade and Zometa IV    To help prevent nausea and vomiting after your treatment, we encourage you to take your nausea medication as directed.  BELOW ARE SYMPTOMS THAT SHOULD BE REPORTED IMMEDIATELY: *FEVER GREATER THAN 100.4 F (38 C) OR HIGHER *CHILLS OR SWEATING *NAUSEA AND VOMITING THAT IS NOT CONTROLLED WITH YOUR NAUSEA MEDICATION *UNUSUAL SHORTNESS OF BREATH *UNUSUAL BRUISING OR BLEEDING *URINARY PROBLEMS (pain or burning when urinating, or frequent urination) *BOWEL PROBLEMS (unusual diarrhea, constipation, pain near the anus) TENDERNESS IN MOUTH AND THROAT WITH OR WITHOUT PRESENCE OF ULCERS (sore throat, sores in mouth, or a toothache) UNUSUAL RASH, SWELLING OR PAIN  UNUSUAL VAGINAL DISCHARGE OR ITCHING   Items with * indicate a potential emergency and should be followed up as soon as possible or go to the Emergency Department if any problems should occur.  Please show the CHEMOTHERAPY ALERT CARD or IMMUNOTHERAPY ALERT CARD at check-in to the  Emergency Department and triage nurse.  Should you have questions after your visit or need to cancel or reschedule your appointment, please contact [facility] [phone]  and follow the prompts.  Office hours are 8:00 a.m. to 4:30 p.m. Monday - Friday. Please note that voicemails left after 4:00 p.m. may not be returned until the following business day.  We are closed weekends and major holidays. You have access to a nurse at all times for urgent questions. Please call the main number to the clinic [phone] and follow the prompts.  For any non-urgent questions, you may also contact your provider using MyChart. We now offer e-Visits for anyone 75 and older to request care online for non-urgent symptoms. For details visit [email or web address].   Also download the MyChart app! Go to the app store, search "MyChart", open the app, select [facility], and log in with your MyChart username and password.  Masks are optional in the cancer centers. If you would like for your care team to wear a mask while they are taking care of you, please let them know. For doctor visits, patients may have with them one support person who is at least 72 years old. At this time, visitors are not allowed in the infusion area.

## 2022-01-17 ENCOUNTER — Inpatient Hospital Stay (HOSPITAL_COMMUNITY): Payer: Medicare Other

## 2022-01-17 ENCOUNTER — Encounter (HOSPITAL_COMMUNITY): Payer: Self-pay

## 2022-01-17 ENCOUNTER — Inpatient Hospital Stay (HOSPITAL_COMMUNITY): Payer: Medicare Other | Attending: Hematology

## 2022-01-17 VITALS — BP 142/79 | HR 62 | Temp 97.1°F | Resp 18 | Wt 126.6 lb

## 2022-01-17 DIAGNOSIS — C9000 Multiple myeloma not having achieved remission: Secondary | ICD-10-CM

## 2022-01-17 DIAGNOSIS — Z5112 Encounter for antineoplastic immunotherapy: Secondary | ICD-10-CM | POA: Diagnosis present

## 2022-01-17 LAB — CBC WITH DIFFERENTIAL/PLATELET
Abs Immature Granulocytes: 0.01 10*3/uL (ref 0.00–0.07)
Basophils Absolute: 0.1 10*3/uL (ref 0.0–0.1)
Basophils Relative: 1 %
Eosinophils Absolute: 0.4 10*3/uL (ref 0.0–0.5)
Eosinophils Relative: 6 %
HCT: 34.2 % — ABNORMAL LOW (ref 36.0–46.0)
Hemoglobin: 10.9 g/dL — ABNORMAL LOW (ref 12.0–15.0)
Immature Granulocytes: 0 %
Lymphocytes Relative: 26 %
Lymphs Abs: 1.6 10*3/uL (ref 0.7–4.0)
MCH: 28.1 pg (ref 26.0–34.0)
MCHC: 31.9 g/dL (ref 30.0–36.0)
MCV: 88.1 fL (ref 80.0–100.0)
Monocytes Absolute: 0.9 10*3/uL (ref 0.1–1.0)
Monocytes Relative: 15 %
Neutro Abs: 3.3 10*3/uL (ref 1.7–7.7)
Neutrophils Relative %: 52 %
Platelets: 304 10*3/uL (ref 150–400)
RBC: 3.88 MIL/uL (ref 3.87–5.11)
RDW: 18.1 % — ABNORMAL HIGH (ref 11.5–15.5)
WBC: 6.2 10*3/uL (ref 4.0–10.5)
nRBC: 0 % (ref 0.0–0.2)

## 2022-01-17 LAB — COMPREHENSIVE METABOLIC PANEL
ALT: 20 U/L (ref 0–44)
AST: 22 U/L (ref 15–41)
Albumin: 4 g/dL (ref 3.5–5.0)
Alkaline Phosphatase: 61 U/L (ref 38–126)
Anion gap: 9 (ref 5–15)
BUN: 15 mg/dL (ref 8–23)
CO2: 26 mmol/L (ref 22–32)
Calcium: 9 mg/dL (ref 8.9–10.3)
Chloride: 99 mmol/L (ref 98–111)
Creatinine, Ser: 0.69 mg/dL (ref 0.44–1.00)
GFR, Estimated: >60 mL/min (ref >60)
Glucose, Bld: 84 mg/dL (ref 70–99)
Potassium: 3.9 mmol/L (ref 3.5–5.1)
Sodium: 134 mmol/L — ABNORMAL LOW (ref 135–145)
Total Bilirubin: 0.3 mg/dL (ref 0.3–1.2)
Total Protein: 6.9 g/dL (ref 6.5–8.1)

## 2022-01-17 LAB — MAGNESIUM: Magnesium: 2 mg/dL (ref 1.7–2.4)

## 2022-01-17 MED ORDER — DIPHENHYDRAMINE HCL 25 MG PO CAPS
50.0000 mg | ORAL_CAPSULE | Freq: Once | ORAL | Status: AC
  Administered 2022-01-17: 50 mg via ORAL
  Filled 2022-01-17: qty 2

## 2022-01-17 MED ORDER — ACETAMINOPHEN 325 MG PO TABS
650.0000 mg | ORAL_TABLET | Freq: Once | ORAL | Status: AC
  Administered 2022-01-17: 650 mg via ORAL
  Filled 2022-01-17: qty 2

## 2022-01-17 MED ORDER — BORTEZOMIB CHEMO SQ INJECTION 3.5 MG (2.5MG/ML)
1.3000 mg/m2 | Freq: Once | INTRAMUSCULAR | Status: AC
  Administered 2022-01-17: 2.25 mg via SUBCUTANEOUS
  Filled 2022-01-17: qty 0.9

## 2022-01-17 MED ORDER — DEXAMETHASONE 4 MG PO TABS
20.0000 mg | ORAL_TABLET | Freq: Once | ORAL | Status: AC
  Administered 2022-01-17: 20 mg via ORAL
  Filled 2022-01-17: qty 5

## 2022-01-17 MED ORDER — PROCHLORPERAZINE MALEATE 10 MG PO TABS
10.0000 mg | ORAL_TABLET | Freq: Once | ORAL | Status: AC
  Administered 2022-01-17: 10 mg via ORAL
  Filled 2022-01-17: qty 1

## 2022-01-17 MED ORDER — DARATUMUMAB-HYALURONIDASE-FIHJ 1800-30000 MG-UT/15ML ~~LOC~~ SOLN
1800.0000 mg | Freq: Once | SUBCUTANEOUS | Status: AC
  Administered 2022-01-17: 1800 mg via SUBCUTANEOUS
  Filled 2022-01-17: qty 15

## 2022-01-17 NOTE — Progress Notes (Signed)
Patient presents today for Daratumumab and Velcade injections per providers order.  Vital signs and labs are within parameters for treatment.   Stable during administration without incident; injection site WNL; see MAR for injection details.  Patient tolerated procedure well and without incident.  No questions or complaints noted at this time.

## 2022-01-17 NOTE — Patient Instructions (Signed)
[facility]  Discharge Instructions: Thank you for choosing [facility] to provide your oncology and hematology care.  If you have a lab appointment with the [facility], please come in thru the Main Entrance and check in at the main information desk.  Wear comfortable clothing and clothing appropriate for easy access to any Portacath or PICC line.   We strive to give you quality time with your provider. You may need to reschedule your appointment if you arrive late (15 or more minutes).  Arriving late affects you and other patients whose appointments are after yours.  Also, if you miss three or more appointments without notifying the office, you may be dismissed from the clinic at the provider's discretion.      For prescription refill requests, have your pharmacy contact our office and allow 72 hours for refills to be completed.    Today you received the following chemotherapy and/or immunotherapy agents Daratumumab and Velcade.      To help prevent nausea and vomiting after your treatment, we encourage you to take your nausea medication as directed.  BELOW ARE SYMPTOMS THAT SHOULD BE REPORTED IMMEDIATELY: *FEVER GREATER THAN 100.4 F (38 C) OR HIGHER *CHILLS OR SWEATING *NAUSEA AND VOMITING THAT IS NOT CONTROLLED WITH YOUR NAUSEA MEDICATION *UNUSUAL SHORTNESS OF BREATH *UNUSUAL BRUISING OR BLEEDING *URINARY PROBLEMS (pain or burning when urinating, or frequent urination) *BOWEL PROBLEMS (unusual diarrhea, constipation, pain near the anus) TENDERNESS IN MOUTH AND THROAT WITH OR WITHOUT PRESENCE OF ULCERS (sore throat, sores in mouth, or a toothache) UNUSUAL RASH, SWELLING OR PAIN  UNUSUAL VAGINAL DISCHARGE OR ITCHING   Items with * indicate a potential emergency and should be followed up as soon as possible or go to the Emergency Department if any problems should occur.  Please show the CHEMOTHERAPY ALERT CARD or IMMUNOTHERAPY ALERT CARD at check-in to the  Emergency Department and triage nurse.  Should you have questions after your visit or need to cancel or reschedule your appointment, please contact [facility] [phone]  and follow the prompts.  Office hours are 8:00 a.m. to 4:30 p.m. Monday - Friday. Please note that voicemails left after 4:00 p.m. may not be returned until the following business day.  We are closed weekends and major holidays. You have access to a nurse at all times for urgent questions. Please call the main number to the clinic [phone] and follow the prompts.  For any non-urgent questions, you may also contact your provider using MyChart. We now offer e-Visits for anyone 20 and older to request care online for non-urgent symptoms. For details visit [email or web address].   Also download the MyChart app! Go to the app store, search "MyChart", open the app, select [facility], and log in with your MyChart username and password.  Masks are optional in the cancer centers. If you would like for your care team to wear a mask while they are taking care of you, please let them know. For doctor visits, patients may have with them one support person who is at least 73 years old. At this time, visitors are not allowed in the infusion area.

## 2022-01-18 ENCOUNTER — Ambulatory Visit (HOSPITAL_COMMUNITY)
Admission: RE | Admit: 2022-01-18 | Discharge: 2022-01-18 | Disposition: A | Discharge status: Home / Self Care | Payer: Medicare Other | Source: Ambulatory Visit | Attending: Hematology | Admitting: Hematology

## 2022-01-18 DIAGNOSIS — M546 Pain in thoracic spine: Secondary | ICD-10-CM | POA: Insufficient documentation

## 2022-01-18 DIAGNOSIS — C9000 Multiple myeloma not having achieved remission: Secondary | ICD-10-CM | POA: Insufficient documentation

## 2022-01-18 LAB — KAPPA/LAMBDA LIGHT CHAINS
Kappa free light chain: 13.2 mg/L (ref 3.3–19.4)
Kappa, lambda light chain ratio: 1.81 — ABNORMAL HIGH (ref 0.26–1.65)
Lambda free light chains: 7.3 mg/L (ref 5.7–26.3)

## 2022-01-18 LAB — MISC LABCORP TEST (SEND OUT): Labcorp test code: 123218

## 2022-01-18 MED ORDER — GADOBUTROL 1 MMOL/ML IV SOLN
6.0000 mL | Freq: Once | INTRAVENOUS | Status: AC | PRN
  Administered 2022-01-18: 6 mL via INTRAVENOUS

## 2022-01-21 LAB — PROTEIN ELECTROPHORESIS, SERUM
A/G Ratio: 1.4 (ref 0.7–1.7)
Albumin ELP: 3.6 g/dL (ref 2.9–4.4)
Alpha-1-Globulin: 0.3 g/dL (ref 0.0–0.4)
Alpha-2-Globulin: 0.8 g/dL (ref 0.4–1.0)
Beta Globulin: 0.9 g/dL (ref 0.7–1.3)
Gamma Globulin: 0.5 g/dL (ref 0.4–1.8)
Globulin, Total: 2.5 g/dL (ref 2.2–3.9)
M-Spike, %: 0.2 g/dL — ABNORMAL HIGH
Total Protein ELP: 6.1 g/dL (ref 6.0–8.5)

## 2022-01-22 ENCOUNTER — Other Ambulatory Visit (HOSPITAL_COMMUNITY): Payer: Medicare Other

## 2022-01-24 ENCOUNTER — Inpatient Hospital Stay (HOSPITAL_COMMUNITY): Payer: Medicare Other

## 2022-01-24 ENCOUNTER — Inpatient Hospital Stay (HOSPITAL_BASED_OUTPATIENT_CLINIC_OR_DEPARTMENT_OTHER): Payer: Medicare Other | Admitting: Hematology

## 2022-01-24 VITALS — BP 111/67 | HR 73 | Temp 97.7°F | Resp 18 | Ht 61.0 in | Wt 123.3 lb

## 2022-01-24 DIAGNOSIS — C9000 Multiple myeloma not having achieved remission: Secondary | ICD-10-CM | POA: Diagnosis not present

## 2022-01-24 DIAGNOSIS — Z5112 Encounter for antineoplastic immunotherapy: Secondary | ICD-10-CM | POA: Diagnosis not present

## 2022-01-24 LAB — CBC WITH DIFFERENTIAL/PLATELET
Abs Immature Granulocytes: 0.02 10*3/uL (ref 0.00–0.07)
Basophils Absolute: 0 10*3/uL (ref 0.0–0.1)
Basophils Relative: 1 %
Eosinophils Absolute: 0.3 10*3/uL (ref 0.0–0.5)
Eosinophils Relative: 4 %
HCT: 34.1 % — ABNORMAL LOW (ref 36.0–46.0)
Hemoglobin: 11 g/dL — ABNORMAL LOW (ref 12.0–15.0)
Immature Granulocytes: 0 %
Lymphocytes Relative: 19 %
Lymphs Abs: 1.3 10*3/uL (ref 0.7–4.0)
MCH: 28.6 pg (ref 26.0–34.0)
MCHC: 32.3 g/dL (ref 30.0–36.0)
MCV: 88.6 fL (ref 80.0–100.0)
Monocytes Absolute: 0.5 10*3/uL (ref 0.1–1.0)
Monocytes Relative: 6 %
Neutro Abs: 4.9 10*3/uL (ref 1.7–7.7)
Neutrophils Relative %: 70 %
Platelets: 325 10*3/uL (ref 150–400)
RBC: 3.85 MIL/uL — ABNORMAL LOW (ref 3.87–5.11)
RDW: 18.6 % — ABNORMAL HIGH (ref 11.5–15.5)
WBC: 7 10*3/uL (ref 4.0–10.5)
nRBC: 0 % (ref 0.0–0.2)

## 2022-01-24 LAB — COMPREHENSIVE METABOLIC PANEL
ALT: 18 U/L (ref 0–44)
AST: 18 U/L (ref 15–41)
Albumin: 3.9 g/dL (ref 3.5–5.0)
Alkaline Phosphatase: 58 U/L (ref 38–126)
Anion gap: 7 (ref 5–15)
BUN: 12 mg/dL (ref 8–23)
CO2: 27 mmol/L (ref 22–32)
Calcium: 8.3 mg/dL — ABNORMAL LOW (ref 8.9–10.3)
Chloride: 100 mmol/L (ref 98–111)
Creatinine, Ser: 0.84 mg/dL (ref 0.44–1.00)
GFR, Estimated: >60 mL/min (ref >60)
Glucose, Bld: 81 mg/dL (ref 70–99)
Potassium: 4.3 mmol/L (ref 3.5–5.1)
Sodium: 134 mmol/L — ABNORMAL LOW (ref 135–145)
Total Bilirubin: 0.5 mg/dL (ref 0.3–1.2)
Total Protein: 6.6 g/dL (ref 6.5–8.1)

## 2022-01-24 LAB — MAGNESIUM: Magnesium: 2 mg/dL (ref 1.7–2.4)

## 2022-01-24 MED ORDER — BORTEZOMIB CHEMO SQ INJECTION 3.5 MG (2.5MG/ML)
1.3000 mg/m2 | Freq: Once | INTRAMUSCULAR | Status: AC
  Administered 2022-01-24: 2.25 mg via SUBCUTANEOUS
  Filled 2022-01-24: qty 0.9

## 2022-01-24 MED ORDER — PROCHLORPERAZINE MALEATE 10 MG PO TABS
10.0000 mg | ORAL_TABLET | Freq: Once | ORAL | Status: AC
  Administered 2022-01-24: 10 mg via ORAL
  Filled 2022-01-24: qty 1

## 2022-01-24 NOTE — Progress Notes (Signed)
[place], [place] [address]   CLINIC:  Medical Oncology/Hematology  PCP:  Amanda Neu, MD [facility] [address] /* [phone]   REASON FOR VISIT:  Follow-up for multiple myeloma  PRIOR THERAPY: none  NGS Results: not done  CURRENT THERAPY: DaraVRd (Daratumumab SQ) q21d x 6 Cycles (Induction/Consolidation)  BRIEF ONCOLOGIC HISTORY:  Oncology History  Multiple myeloma ([place])  07/17/2021 Initial Diagnosis   Multiple myeloma ([place])   07/26/2021 -  Chemotherapy   Patient is on Treatment Plan : MYELOMA NEWLY DIAGNOSED TRANSPLANT CANDIDATE DaraVRd (Daratumumab SQ) q21d x 6 Cycles (Induction/Consolidation)       CANCER STAGING:  Cancer Staging  Multiple myeloma ([place]) Staging form: Multiple Myeloma, AJCC 6th Edition - Clinical stage from 07/17/2021: Stage IA - Unsigned   INTERVAL HISTORY:  Amanda Conner, a 71 y.o. female, returns for routine follow-up and consideration for next cycle of chemotherapy. Amanda Conner was last seen on 01/03/2022.  Due for cycle #9 of DaraVRd today.   Overall, she tells me she has been feeling pretty well. She reports abdominal pain and nausea as well as worsened dry cough. She reports she has had a slight fever around 100 F which is not present today. She reports diarrhea for which she is taking Imodium and Pepto bismol. Her back pain is relieved while sitting but is worsened when standing. She denies tingling/numbness. She reports easy bruising on her arms.   Overall, she feels ready for next cycle of chemo today.    REVIEW OF SYSTEMS:  Review of Systems  Constitutional:  Positive for appetite change, fatigue and fever.  Respiratory:  Positive for cough (dry) and shortness of breath.   Gastrointestinal:  Positive for abdominal pain, diarrhea and nausea.  Musculoskeletal:  Positive for back pain (5/10).  Neurological:  Negative for numbness.   Hematological:  Bruises/bleeds easily.  All other systems reviewed and are negative.   PAST MEDICAL/SURGICAL HISTORY:  Past Medical History:  Diagnosis Date   Arthritis    osteoarthritis   Back pain    Bronchiectasis (HCC)    Cancer (HCC)    basal cell nose   Chronic cough    Complication of anesthesia    Dyspnea    Gastrointestinal symptoms    GERD (gastroesophageal reflux disease)    Glaucoma    History of hiatal hernia    Hyperlipemia    Hypertension    Multiple myeloma (HCC)    Osteopenia    Pneumonia    PONV (postoperative nausea and vomiting)    "only one time"   Spondylisthesis    Past Surgical History:  Procedure Laterality Date   ABDOMINAL EXPOSURE N/A 12/29/2018   Procedure: ABDOMINAL EXPOSURE;  Surgeon: Angelia Mould, MD;  Location: [place];  Service: Vascular;  Laterality: N/A;   ABDOMINOPLASTY  2002   ABDOMINOPLASTY  1970's   ANKLE SURGERY Left 10/2015   ANTERIOR LATERAL LUMBAR FUSION WITH PERCUTANEOUS SCREW 3 LEVEL Left 12/29/2018   Procedure: Lumbar Two to Lumbar Five Anterolateral lumbar interbody fusion;  Surgeon: Erline Levine, MD;  Location: [place];  Service: Neurosurgery;  Laterality: Left;  anterolateral   ANTERIOR LUMBAR FUSION N/A 12/29/2018   Procedure: Lumbar Five Sacral One Anterior lumbar interbody fusion;  Surgeon: Erline Levine, MD;  Location: [place];  Service: Neurosurgery;  Laterality: N/A;  anterior approach   BASAL CELL CARCINOMA EXCISION  06/2018   nose   BLEPHAROPLASTY Bilateral 1999  BREAST ENHANCEMENT SURGERY  1970's   COLONOSCOPY     CYSTOURETHROSCOPY  2018   [place] J ureteal stents   DECOMPRESSION CORE HIP Left 2014   FACIAL COSMETIC SURGERY  2004   FLUOROSCOPY GUIDANCE     HIP ARTHROPLASTY Left    INCISIONAL HERNIA REPAIR N/A 10/08/2019   Procedure: OPEN INCISIONAL HERNIA REPAIR WITH MESH;  Surgeon: Armandina Gemma, MD;  Location: [facility];  Service: General;  Laterality: N/A;   JOINT REPLACEMENT Left    Hip; 05/2014    KYPHOPLASTY N/A 08/31/2021   Procedure: KYPHOPLASTY THORACIC ELEVEN, THORACIC TWELVE;  Surgeon: Karsten Ro, DO;  Location: [place];  Service: Neurosurgery;  Laterality: N/A;   LAPAROSCOPIC PARTIAL COLECTOMY  06/26/2017   LUMBAR PERCUTANEOUS PEDICLE SCREW 4 LEVEL N/A 12/29/2018   Procedure: Lumbar Percutaneous Pedicle Screw Placement Lumbar two-Sacral one;  Surgeon: Erline Levine, MD;  Location: [place];  Service: Neurosurgery;  Laterality: N/A;   MOHS SURGERY  2019   nose   PARTIAL COLECTOMY  06/2017   for diverticulitis   REMOVAL OF BILATERAL TISSUE EXPANDERS WITH PLACEMENT OF BILATERAL BREAST IMPLANTS  2004   THIGH LIFT  1994   TOE FUSION Right    great toe   TONSILLECTOMY     UMBILICAL HERNIA REPAIR     with tummy tuck   UMBILICAL HERNIA REPAIR  2002    SOCIAL HISTORY:  Social History   Socioeconomic History   Marital status: Married    Spouse name: Not on file   Number of children: Not on file   Years of education: Not on file   Highest education level: Not on file  Occupational History   Not on file  Tobacco Use   Smoking status: Never   Smokeless tobacco: Never  Vaping Use   Vaping Use: Never used  Substance and Sexual Activity   Alcohol use: Yes    Comment: occasional   Drug use: Never   Sexual activity: Not on file  Other Topics Concern   Not on file  Social History Narrative   Not on file   Social Determinants of Health   Financial Resource Strain: Low Risk  (07/24/2021)   Overall Financial Resource Strain (CARDIA)    Difficulty of Paying Living Expenses: Not hard at all  Food Insecurity: No Food Insecurity (07/24/2021)   Hunger Vital Sign    Worried About Running Out of Food in the Last Year: Never true    [place] in the Last Year: Never true  Transportation Needs: No Transportation Needs (07/24/2021)   PRAPARE - Hydrologist (Medical): No    Lack of Transportation (Non-Medical): No  Physical Activity: Not on file   Stress: Not on file  Social Connections: Socially Integrated (07/24/2021)   Social Connection and Isolation Panel [NHANES]    Frequency of Communication with Friends and Family: More than three times a week    Frequency of Social Gatherings with Friends and Family: More than three times a week    Attends Religious Services: 1 to 4 times per year    Active Member of [employer] or Organizations: No    Attends Music therapist: 1 to 4 times per year    Marital Status: Married  Human resources officer Violence: Not on file    FAMILY HISTORY:  Family History  Problem Relation Age of Onset   Hypertension Mother    COPD Mother    Hypertension Father  Heart disease Father     CURRENT MEDICATIONS:  Current Outpatient Medications  Medication Sig Dispense Refill   albuterol (VENTOLIN HFA) 108 (90 Base) MCG/ACT inhaler Inhale 2 puffs into the lungs every 6 (six) hours as needed for wheezing or shortness of breath.     aspirin EC 81 MG tablet Take 81 mg by mouth daily. Swallow whole.     Baclofen 5 MG TABS Take 1 tablet by mouth 3 (three) times daily as needed.     benzonatate (TESSALON) 100 MG capsule Take 100 mg by mouth 3 (three) times daily as needed for cough.     Biotin 1000 MCG tablet Take 1,000 mcg by mouth daily.     calcitonin, salmon, (MIACALCIN/FORTICAL) 200 UNIT/ACT nasal spray Place 1 spray into alternate nostrils daily.     Calcium Carb-Cholecalciferol 600-800 MG-UNIT TABS Take 1 tablet by mouth daily.     daratumumab-hyaluronidase-fihj (DARZALEX FASPRO) 1800-30000 MG-UT/15ML SOLN Inject 1,800 mg into the skin once a week.     dexamethasone (DECADRON) 2 MG tablet Take 5 tablets (10 mg total) by mouth once a week. 20 tablet 4   dextromethorphan-guaiFENesin (MUCINEX DM) 30-600 MG 12hr tablet Take 1 tablet by mouth daily.      estrogen, conjugated,-medroxyprogesterone (PREMPRO) 0.45-1.5 MG tablet Take 1 tablet by mouth daily.     feeding supplement (ENSURE ENLIVE / ENSURE PLUS)  LIQD Take 237 mLs by mouth 2 (two) times daily between meals. (Patient taking differently: Take 237 mLs by mouth daily.) 237 mL 12   fluconazole (DIFLUCAN) 150 MG tablet Take by mouth.     fluticasone (FLONASE) 50 MCG/ACT nasal spray Place 1 spray into both nostrils daily.     furosemide (LASIX) 20 MG tablet TAKE 1 TABLET BY MOUTH EVERY DAY AS NEEDED 90 tablet 3   hydrochlorothiazide (HYDRODIURIL) 25 MG tablet Take 25 mg by mouth daily as needed (swelling).     HYDROcodone bit-homatropine (HYCODAN) 5-1.5 MG/5ML syrup Take 5 mLs by mouth every 6 (six) hours as needed for cough. 473 mL 0   Lactulose 20 GM/30ML SOLN Take 30 ml by mouth every 3 hours until bowel movement is had, then take 30 ml daily (Patient taking differently: Take 20 g by mouth daily as needed (constipation).) 946 mL 3   latanoprost (XALATAN) 0.005 % ophthalmic solution Place 1 drop into both eyes at bedtime.      lenalidomide (REVLIMID) 20 MG capsule Take 1 capsule (20 mg total) by mouth daily. 14 days on, 7 days off 14 capsule 0   linaclotide (LINZESS) 145 MCG CAPS capsule Take 145 mcg by mouth daily as needed (constipation).     lisinopril (ZESTRIL) 20 MG tablet Take 20 mg by mouth daily.     loratadine (CLARITIN) 10 MG tablet Take 10 mg by mouth daily.     methocarbamol (ROBAXIN) 500 MG tablet Take 1 tablet (500 mg total) by mouth every 6 (six) hours as needed for muscle spasms. 40 tablet 3   omeprazole (PRILOSEC) 20 MG capsule Take 20 mg by mouth daily.     ondansetron (ZOFRAN ODT) 4 MG disintegrating tablet Take 1 tablet (4 mg total) by mouth every 4 (four) hours as needed for nausea or vomiting. 20 tablet 6   Oxycodone HCl 10 MG TABS Take 1 tablet (10 mg total) by mouth every 6 (six) hours as needed. 120 tablet 0   pantoprazole (PROTONIX) 40 MG tablet Take 40 mg by mouth daily.     sucralfate (CARAFATE) 1 g  tablet Take 1 g by mouth 4 (four) times daily.     SUMAtriptan (IMITREX) 100 MG tablet Take 100 mg by mouth every 2  (two) hours as needed for migraine. May repeat in 2 hours if headache persists or recurs.     UNABLE TO FIND Place 0.5 g into both eyes 3 (three) times daily. Med Name: Loteman (otepredol)     valACYclovir (VALTREX) 500 MG tablet TAKE 1 TABLET BY MOUTH TWICE A DAY 180 tablet 2   No current facility-administered medications for this visit.    ALLERGIES:  Allergies  Allergen Reactions   Tobramycin-Dexamethasone Other (See Comments)    Itching and swelling   Ethambutol Other (See Comments)    Fever   Neomycin-Polymyxin-Dexameth Itching and Swelling   Amikacin Other (See Comments)    Eosinophilia with chills and aching when given with cefoxitin   Biaxin [Clarithromycin] Itching   Cefoxitin Other (See Comments)    Eosinophlia when given with amikacin   Sulfamethoxazole-Trimethoprim Itching   Vancomycin     "Got red splotches on skin after several doses"   Bacitracin-Polymyxin B Rash    PHYSICAL EXAM:  Performance status (ECOG): 1 - Symptomatic but completely ambulatory  There were no vitals filed for this visit. Wt Readings from Last 3 Encounters:  01/17/22 126 lb 9.6 oz (57.4 kg)  01/10/22 123 lb 9.6 oz (56.1 kg)  01/03/22 126 lb 12.8 oz (57.5 kg)   Physical Exam Vitals reviewed.  Constitutional:      Appearance: Normal appearance.     Comments: In wheelchair  Cardiovascular:     Rate and Rhythm: Normal rate and regular rhythm.     Pulses: Normal pulses.     Heart sounds: Normal heart sounds.  Pulmonary:     Effort: Pulmonary effort is normal.     Breath sounds: Normal breath sounds.  Neurological:     General: No focal deficit present.     Mental Status: She is alert and oriented to person, place, and time.  Psychiatric:        Mood and Affect: Mood normal.        Behavior: Behavior normal.     LABORATORY DATA:  I have reviewed the labs as listed.     Latest Ref Rng & Units 01/17/2022   12:41 PM 01/10/2022   11:03 AM 01/03/2022   10:33 AM  CBC  WBC 4.0 - 10.5  K/uL 6.2  6.4  8.6   Hemoglobin 12.0 - 15.0 g/dL 10.9  11.2  11.5   Hematocrit 36.0 - 46.0 % 34.2  35.6  36.4   Platelets 150 - 400 K/uL 304  ACLMP  285       Latest Ref Rng & Units 01/17/2022   12:41 PM 01/10/2022   11:03 AM 01/03/2022   10:33 AM  CMP  Glucose 70 - 99 mg/dL 84  92  80   BUN 8 - 23 mg/dL _0 Creatinine 0.44 - 1.00 mg/dL 0.69  0.76  0.73   Sodium 135 - 145 mmol/L 134  134  134   Potassium 3.5 - 5.1 mmol/L 3.9  3.8  3.8   Chloride 98 - 111 mmol/L 99  102  98   CO2 22 - 32 mmol/L _1 Calcium 8.9 - 10.3 mg/dL 9.0  8.8  9.4   Total Protein 6.5 - 8.1 g/dL 6.9  6.4  7.6   Total Bilirubin 0.3 - 1.2  mg/dL 0.3  0.7  0.5   Alkaline Phos 38 - 126 U/L 61  56  68   AST 15 - 41 U/L _0 ALT 0 - 44 U/L _1 DIAGNOSTIC IMAGING:  I have independently reviewed the scans and discussed with the patient. MR Thoracic Spine W Wo Contrast  Result Date: 01/20/2022 CLINICAL DATA:  Mid back pain.  History of multiple myeloma. EXAM: MRI THORACIC WITHOUT AND WITH CONTRAST TECHNIQUE: Multiplanar and multiecho pulse sequences of the thoracic spine were obtained without and with intravenous contrast. CONTRAST:  39m GADAVIST GADOBUTROL 1 MMOL/ML IV SOLN COMPARISON:  08/16/2021 FINDINGS: Alignment:  Mild thoracic kyphosis centered at T11-12. Vertebrae: Small stable right T5 facet bone lesion. Stable left posterior seventh rib bone lesion. No acute fracture or evidence of discitis. Chronic T11 vertebral body compression fracture with minimal height loss and methylmethacrylate within the vertebral body from prior augmentation. Severe chronic T12 vertebral body compression fracture with greater than 90% height loss with a small amount of methylmethacrylate within the vertebral body from prior augmentation and 4 mm of retropulsion of the superior posterior corner of the T12 vertebral body impressing upon the ventral thecal sac without spinal stenosis. Cord:  Normal signal and  morphology. Paraspinal and other soft tissues: No acute paraspinal abnormality. Disc levels: Disc spaces:  Disc spaces are maintained. T1-T2: No disc protrusion, foraminal stenosis or central canal stenosis. T2-T3: No disc protrusion, foraminal stenosis or central canal stenosis. T3-T4: No disc protrusion, foraminal stenosis or central canal stenosis. T4-T5: No disc protrusion, foraminal stenosis or central canal stenosis. T5-T6: No disc protrusion, foraminal stenosis or central canal stenosis. T6-T7: No disc protrusion, foraminal stenosis or central canal stenosis. T7-T8: No disc protrusion, foraminal stenosis or central canal stenosis. T8-T9: No disc protrusion, foraminal stenosis or central canal stenosis. T9-T10: No disc protrusion, foraminal stenosis or central canal stenosis. T10-T11: No disc protrusion, foraminal stenosis or central canal stenosis. T11-T12: No disc protrusion, foraminal stenosis or central canal stenosis. L1-2: Mild broad-based disc bulge. No foraminal or central canal stenosis. Partially visualized posterior lumbar interbody fusion from L2 through S1. IMPRESSION: 1. No acute injury of the thoracic spine. 2. Stable chronic T11 and severe T12 vertebral body compression fractures. 3. Stable bone lesion in the right T5 facet and left posterior seventh rib consistent with multiple myeloma. No new bone lesions. Electronically Signed   By: HKathreen DevoidM.D.   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 SWilburn Mylar- She reported discomfort in the left posterior back for the last 1 month.  She was seen by Dr. MChauncey Readingand x-rays showed abnormality.  A CT scan of the chest was done on 05/24/2021. - CT chest report reviewed by me from 05/24/2021 showed expansile oval-shaped lesion within the posterior aspect of the seventh rib with a nodule measuring 2.1 x 1.1 cm.  Adjacent cortex demonstrates area of thinning  and destruction.  Stable emphysematous and interstitial changes within the lungs.  Stable pulmonary nodules within the right upper lobe and left upper lobe when compared to CT from September 2020. - CT of the abdomen and pelvis did not show any significant abnormalities. - She denies any fevers, night sweats or weight loss in the last 6 months.  She had history of basal cell carcinoma on the nose removed by  Mohs surgery. - She also reported history of osteoporosis and broke left upper rib 1 year ago after sneezing. -Reviewed PET scan from 06/14/2021.  Mild hypermetabolism involving left aspect of the L5 vertebral body and also pedicle region surrounding the screw.  SUV 4.2.  There appears to be a lytic process on the CT scan.  The lytic left posterior seventh rib lesion is mildly hypermetabolic with SUV 3.01.  No other definite lytic hypermetabolic bone lesions seen. - We have reviewed left seventh rib bone biopsy from 06/25/2021 consistent with plasma cell neoplasm.  Cells are positive for CD138, CD56 and a kappa restricted.  - Labs on 06/13/2021 M spike 1.7 g.  Immunofixation IgA kappa.  Free light chain shows kappa light chain 71, ratio 8.22.  LDH and beta-2 microglobulin was normal. - Bone marrow biopsy on 07/04/2021 consistent with plasma cell neoplasm with 26% plasma cells on aspirate with atypical features and 50 to 60% on core biopsy.  Chromosome analysis was normal.  Multiple myeloma FISH panel shows loss of NF1/chromosome 17/17 q.  Gain of CCN D1 or chromosome 11/11 q.  Gain of chromosome 4/4P.  Loss of material from the long-arm of chromosome 13.  These are all standard risk features. - 24-hour urine total protein to 40 mg.  Urine immunofixation was negative.  Urine kappa light chains were 4.13. - Dara RVD started on 07/26/2021. - T11 and T12 vertebroplasty by Dr. Reatha Armour on 08/31/2021. - She has decided against bone marrow transplant after the advice of Dr. Lorenda Cahill.   Social/family history: - She  is married and lives at home with her husband.  She has not worked but had help her husband and his work.  She is a non-smoker. - Maternal grandmother had colon cancer.  Mother had a squamous cell carcinoma of the skin.   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 - She reported nauseated for the last few days.  Cough is slightly worse mostly dry.  Diarrhea also reported in the last few days and took Imodium and Pepto-Bismol. - Myeloma panel on 01/17/2022 with M spike 0.2 g.  Dara specific immunofixation was negative.  Free light chain ratio is 1.81 improved from 2.5. - Other labs today were normal for treatment with cycle 7. - I will consider maintenance Revlimid with or without monthly Darzalex once the free light chain ratio improved to normal.  RTC 3 weeks for follow-up.  2.  Mid back pain (s/p vertebroplasty of T11 and T12 by Dr. Reatha Armour on 08/31/2021): - Continue methocarbamol as needed. - Reviewed MRI thoracic spine from 01/18/2022 which did not show any major changes. - She was seen by Dr. Reatha Armour and physical therapy will start when we start maintenance for myeloma.  3.  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da [facility] at [facility].  She is not on any active therapy at this time.  No significant change in baseline cough.  4.  Myeloma bone disease: - Continue Zometa every 4 weeks.  5.  ID prophylaxis: - Continue Valtrex 100 mg twice daily.  Continue aspirin 81 mg for thromboprophylaxis.   Orders placed this encounter:  No orders of the defined types were placed in this encounter.    Derek Jack, MD [place] [phone]   I, Thana Ates, am acting as a scribe for Dr. Derek Jack.  Kinnie Scales MD,  have reviewed the above documentation for accuracy and completeness, and I agree with the above.

## 2022-01-24 NOTE — Patient Instructions (Signed)
[facility]  Discharge Instructions: Thank you for choosing [facility] to provide your oncology and hematology care.  If you have a lab appointment with the Cancer Center, please come in thru the Main Entrance and check in at the main information desk.  Wear comfortable clothing and clothing appropriate for easy access to any Portacath or PICC line.   We strive to give you quality time with your provider. You may need to reschedule your appointment if you arrive late (15 or more minutes).  Arriving late affects you and other patients whose appointments are after yours.  Also, if you miss three or more appointments without notifying the office, you may be dismissed from the clinic at the provider's discretion.      For prescription refill requests, have your pharmacy contact our office and allow 72 hours for refills to be completed.    Today you received the following chemotherapy and/or immunotherapy agents Velcade.  Bortezomib injection What is this medication? BORTEZOMIB (bor TEZ oh mib) targets proteins in cancer cells and stops the cancer cells from growing. It treats multiple myeloma and mantle cell lymphoma. This medicine may be used for other purposes; ask your health care provider or pharmacist if you have questions. COMMON BRAND NAME(S): Velcade What should I tell my care team before I take this medication? They need to know if you have any of these conditions: dehydration diabetes (high blood sugar) heart disease liver disease tingling of the fingers or toes or other nerve disorder an unusual or allergic reaction to bortezomib, mannitol, boron, other medicines, foods, dyes, or preservatives pregnant or trying to get pregnant breast-feeding How should I use this medication? This medicine is injected into a vein or under the skin. It is given by a health care provider in a hospital or clinic setting. Talk to your health care provider about the use of  this medicine in children. Special care may be needed. Overdosage: If you think you have taken too much of this medicine contact a poison control center or emergency room at once. NOTE: This medicine is only for you. Do not share this medicine with others. What if I miss a dose? Keep appointments for follow-up doses. It is important not to miss your dose. Call your health care provider if you are unable to keep an appointment. What may interact with this medication? This medicine may interact with the following medications: ketoconazole rifampin This list may not describe all possible interactions. Give your health care provider a list of all the medicines, herbs, non-prescription drugs, or dietary supplements you use. Also tell them if you smoke, drink alcohol, or use illegal drugs. Some items may interact with your medicine. What should I watch for while using this medication? Your condition will be monitored carefully while you are receiving this medicine. You may need blood work done while you are taking this medicine. You may get drowsy or dizzy. Do not drive, use machinery, or do anything that needs mental alertness until you know how this medicine affects you. Do not stand up or sit up quickly, especially if you are an older patient. This reduces the risk of dizzy or fainting spells This medicine may increase your risk of getting an infection. Call your health care provider for advice if you get a fever, chills, sore throat, or other symptoms of a cold or flu. Do not treat yourself. Try to avoid being around people who are sick. Check with your health care provider   if you have severe diarrhea, nausea, and vomiting, or if you sweat a lot. The loss of too much body fluid may make it dangerous for you to take this medicine. Do not become pregnant while taking this medicine or for 7 months after stopping it. Women should inform their health care provider if they wish to become pregnant or think  they might be pregnant. Men should not father a child while taking this medicine and for 4 months after stopping it. There is a potential for serious harm to an unborn child. Talk to your health care provider for more information. Do not breast-feed an infant while taking this medicine or for 2 months after stopping it. This medicine may make it more difficult to get pregnant or father a child. Talk to your health care provider if you are concerned about your fertility. What side effects may I notice from receiving this medication? Side effects that you should report to your doctor or health care professional as soon as possible: allergic reactions (skin rash; itching or hives; swelling of the face, lips, or tongue) bleeding (bloody or black, tarry stools; red or dark Jazae Gandolfi urine; spitting up blood or Niley Helbig material that looks like coffee grounds; red spots on the skin; unusual bruising or bleeding from the eye, gums, or nose) blurred vision or changes in vision confusion constipation headache heart failure (trouble breathing; fast, irregular heartbeat; sudden weight gain; swelling of the ankles, feet, hands) infection (fever, chills, cough, sore throat, pain or trouble passing urine) lack or loss of appetite liver injury (dark yellow or Karesa Maultsby urine; general ill feeling or flu-like symptoms; loss of appetite, right upper belly pain; yellowing of the eyes or skin) low blood pressure (dizziness; feeling faint or lightheaded, falls; unusually weak or tired) muscle cramps pain, redness, or irritation at site where injected pain, tingling, numbness in the hands or feet seizures trouble breathing unusual bruising or bleeding Side effects that usually do not require medical attention (report to your doctor or health care professional if they continue or are bothersome): diarrhea nausea stomach pain trouble sleeping vomiting This list may not describe all possible side effects. Call your doctor  for medical advice about side effects. You may report side effects to FDA at 1-800-FDA-1088. Where should I keep my medication? This medicine is given in a hospital or clinic. It will not be stored at home. NOTE: This sheet is a summary. It may not cover all possible information. If you have questions about this medicine, talk to your doctor, pharmacist, or health care provider.  2023 Elsevier/Gold Standard (2020-06-22 00:00:00)   To help prevent nausea and vomiting after your treatment, we encourage you to take your nausea medication as directed.  BELOW ARE SYMPTOMS THAT SHOULD BE REPORTED IMMEDIATELY: *FEVER GREATER THAN 100.4 F (38 C) OR HIGHER *CHILLS OR SWEATING *NAUSEA AND VOMITING THAT IS NOT CONTROLLED WITH YOUR NAUSEA MEDICATION *UNUSUAL SHORTNESS OF BREATH *UNUSUAL BRUISING OR BLEEDING *URINARY PROBLEMS (pain or burning when urinating, or frequent urination) *BOWEL PROBLEMS (unusual diarrhea, constipation, pain near the anus) TENDERNESS IN MOUTH AND THROAT WITH OR WITHOUT PRESENCE OF ULCERS (sore throat, sores in mouth, or a toothache) UNUSUAL RASH, SWELLING OR PAIN  UNUSUAL VAGINAL DISCHARGE OR ITCHING   Items with * indicate a potential emergency and should be followed up as soon as possible or go to the Emergency Department if any problems should occur.  Please show the CHEMOTHERAPY ALERT CARD or IMMUNOTHERAPY ALERT CARD at check-in to the Emergency Department   and triage nurse.  Should you have questions after your visit or need to cancel or reschedule your appointment, please contact [facility] [phone]  and follow the prompts.  Office hours are 8:00 a.m. to 4:30 p.m. Monday - Friday. Please note that voicemails left after 4:00 p.m. may not be returned until the following business day.  We are closed weekends and major holidays. You have access to a nurse at all times for urgent questions. Please call the main number to the clinic [phone] and follow the  prompts.  For any non-urgent questions, you may also contact your provider using MyChart. We now offer e-Visits for anyone 18 and older to request care online for non-urgent symptoms. For details visit [email or web address].   Also download the MyChart app! Go to the app store, search "MyChart", open the app, select [facility] [facility], and log in with your MyChart username and password.  Masks are optional in the cancer centers. If you would like for your care team to wear a mask while they are taking care of you, please let them know. For doctor visits, patients may have with them one support person who is at least 72 years old. At this time, visitors are not allowed in the infusion area.  

## 2022-01-24 NOTE — Patient Instructions (Addendum)
[place] at [facility] Discharge Instructions   You were seen and examined today by Dr. Delton Coombes.  He reviewed the results of your lab work. The dara-specific immunofixation is normal, which means your m-spike is not there. The light chain ratio is still slightly elevated. Dr. Raliegh Ip would like for you to complete one more cycle of treatment before going on maintenance therapy.   We will proceed with treatment today.  Return as scheduled.    Thank you for choosing [place] at [facility] to provide your oncology and hematology care.  To afford each patient quality time with our provider, please arrive at least 15 minutes before your scheduled appointment time.   If you have a lab appointment with the [place] please come in thru the Main Entrance and check in at the main information desk.  You need to re-schedule your appointment should you arrive 10 or more minutes late.  We strive to give you quality time with our providers, and arriving late affects you and other patients whose appointments are after yours.  Also, if you no show three or more times for appointments you may be dismissed from the clinic at the providers discretion.     Again, thank you for choosing [facility].  Our hope is that these requests will decrease the amount of time that you wait before being seen by our physicians.       _____________________________________________________________  Should you have questions after your visit to [facility], please contact our office at [phone] and follow the prompts.  Our office hours are 8:00 a.m. and 4:30 p.m. Monday - Friday.  Please note that voicemails left after 4:00 p.m. may not be returned until the following business day.  We are closed weekends and major holidays.  You do have access to a nurse 24-7, just call the main number to the clinic [phone] and do not press any options, hold on  the line and a nurse will answer the phone.    For prescription refill requests, have your pharmacy contact our office and allow 72 hours.    Due to Covid, you will need to wear a mask upon entering the hospital. If you do not have a mask, a mask will be given to you at the Main Entrance upon arrival. For doctor visits, patients may have 1 support person age 75 or older with them. For treatment visits, patients can not have anyone with them due to social distancing guidelines and our immunocompromised population.

## 2022-01-24 NOTE — Progress Notes (Signed)
Patient presents today for Velcade injection.  Patient is in satisfactory condition with no new complaints voiced.  Vital signs are stable.  Labs reviewed by Dr. Delton Coombes during her office visit.  All labs are within treatment parameters.  We will proceed with treatment per MD orders.   Patient tolerated injection with no complaints voiced.  Site clean and dry with no bruising or swelling noted.  No complaints of pain.  Discharged with vital signs stable and no signs or symptoms of distress noted.

## 2022-01-28 ENCOUNTER — Other Ambulatory Visit (HOSPITAL_COMMUNITY): Payer: Self-pay

## 2022-01-28 MED ORDER — LENALIDOMIDE 20 MG PO CAPS: 20.0000 mg | ORAL_CAPSULE | Freq: Every day | ORAL | 0 refills | Status: DC

## 2022-01-28 NOTE — Telephone Encounter (Signed)
Chart reviewed. Revlimid refilled per last office note with Dr. Katragadda.  

## 2022-02-01 ENCOUNTER — Inpatient Hospital Stay (HOSPITAL_COMMUNITY): Payer: Medicare Other

## 2022-02-01 VITALS — BP 122/73 | HR 72 | Temp 98.6°F | Resp 18 | Wt 125.0 lb

## 2022-02-01 DIAGNOSIS — C9000 Multiple myeloma not having achieved remission: Secondary | ICD-10-CM

## 2022-02-01 DIAGNOSIS — Z5112 Encounter for antineoplastic immunotherapy: Secondary | ICD-10-CM | POA: Diagnosis not present

## 2022-02-01 LAB — CBC WITH DIFFERENTIAL/PLATELET
Abs Immature Granulocytes: 0.02 10*3/uL (ref 0.00–0.07)
Basophils Absolute: 0.1 10*3/uL (ref 0.0–0.1)
Basophils Relative: 1 %
Eosinophils Absolute: 0.3 10*3/uL (ref 0.0–0.5)
Eosinophils Relative: 5 %
HCT: 33.1 % — ABNORMAL LOW (ref 36.0–46.0)
Hemoglobin: 10.8 g/dL — ABNORMAL LOW (ref 12.0–15.0)
Immature Granulocytes: 0 %
Lymphocytes Relative: 22 %
Lymphs Abs: 1.4 10*3/uL (ref 0.7–4.0)
MCH: 28.6 pg (ref 26.0–34.0)
MCHC: 32.6 g/dL (ref 30.0–36.0)
MCV: 87.6 fL (ref 80.0–100.0)
Monocytes Absolute: 0.7 10*3/uL (ref 0.1–1.0)
Monocytes Relative: 11 %
Neutro Abs: 3.7 10*3/uL (ref 1.7–7.7)
Neutrophils Relative %: 61 %
Platelets: 229 10*3/uL (ref 150–400)
RBC: 3.78 MIL/uL — ABNORMAL LOW (ref 3.87–5.11)
RDW: 18.1 % — ABNORMAL HIGH (ref 11.5–15.5)
WBC: 6.2 10*3/uL (ref 4.0–10.5)
nRBC: 0 % (ref 0.0–0.2)

## 2022-02-01 LAB — MAGNESIUM: Magnesium: 1.7 mg/dL (ref 1.7–2.4)

## 2022-02-01 MED ORDER — DEXAMETHASONE 4 MG PO TABS
20.0000 mg | ORAL_TABLET | Freq: Once | ORAL | Status: AC
  Administered 2022-02-01: 20 mg via ORAL
  Filled 2022-02-01: qty 5

## 2022-02-01 MED ORDER — DIPHENHYDRAMINE HCL 25 MG PO CAPS
50.0000 mg | ORAL_CAPSULE | Freq: Once | ORAL | Status: AC
  Administered 2022-02-01: 50 mg via ORAL
  Filled 2022-02-01: qty 2

## 2022-02-01 MED ORDER — BORTEZOMIB CHEMO SQ INJECTION 3.5 MG (2.5MG/ML)
1.3000 mg/m2 | Freq: Once | INTRAMUSCULAR | Status: AC
  Administered 2022-02-01: 2.25 mg via SUBCUTANEOUS
  Filled 2022-02-01: qty 0.9

## 2022-02-01 MED ORDER — DARATUMUMAB-HYALURONIDASE-FIHJ 1800-30000 MG-UT/15ML ~~LOC~~ SOLN
1800.0000 mg | Freq: Once | SUBCUTANEOUS | Status: AC
  Administered 2022-02-01: 1800 mg via SUBCUTANEOUS
  Filled 2022-02-01: qty 15

## 2022-02-01 MED ORDER — ACETAMINOPHEN 325 MG PO TABS
650.0000 mg | ORAL_TABLET | Freq: Once | ORAL | Status: AC
  Administered 2022-02-01: 650 mg via ORAL
  Filled 2022-02-01: qty 2

## 2022-02-01 NOTE — Progress Notes (Signed)
Labs reviewed today , will proceed with treatment today per parameters.   Treatment given per orders. Patient tolerated it well without problems. Vitals stable and discharged home from clinic via wheelchair. Follow up as scheduled.

## 2022-02-01 NOTE — Patient Instructions (Signed)
[facility]  Discharge Instructions: Thank you for choosing [facility] to provide your oncology and hematology care.  If you have a lab appointment with the [facility], please come in thru the Main Entrance and check in at the main information desk.  Wear comfortable clothing and clothing appropriate for easy access to any Portacath or PICC line.   We strive to give you quality time with your provider. You may need to reschedule your appointment if you arrive late (15 or more minutes).  Arriving late affects you and other patients whose appointments are after yours.  Also, if you miss three or more appointments without notifying the office, you may be dismissed from the clinic at the provider's discretion.      For prescription refill requests, have your pharmacy contact our office and allow 72 hours for refills to be completed.    Today you received the following chemotherapy and/or immunotherapy agents Darzalex, velcade      To help prevent nausea and vomiting after your treatment, we encourage you to take your nausea medication as directed.  BELOW ARE SYMPTOMS THAT SHOULD BE REPORTED IMMEDIATELY: *FEVER GREATER THAN 100.4 F (38 C) OR HIGHER *CHILLS OR SWEATING *NAUSEA AND VOMITING THAT IS NOT CONTROLLED WITH YOUR NAUSEA MEDICATION *UNUSUAL SHORTNESS OF BREATH *UNUSUAL BRUISING OR BLEEDING *URINARY PROBLEMS (pain or burning when urinating, or frequent urination) *BOWEL PROBLEMS (unusual diarrhea, constipation, pain near the anus) TENDERNESS IN MOUTH AND THROAT WITH OR WITHOUT PRESENCE OF ULCERS (sore throat, sores in mouth, or a toothache) UNUSUAL RASH, SWELLING OR PAIN  UNUSUAL VAGINAL DISCHARGE OR ITCHING   Items with * indicate a potential emergency and should be followed up as soon as possible or go to the Emergency Department if any problems should occur.  Please show the CHEMOTHERAPY ALERT CARD or IMMUNOTHERAPY ALERT CARD at check-in to the  Emergency Department and triage nurse.  Should you have questions after your visit or need to cancel or reschedule your appointment, please contact [facility] [phone]  and follow the prompts.  Office hours are 8:00 a.m. to 4:30 p.m. Monday - Friday. Please note that voicemails left after 4:00 p.m. may not be returned until the following business day.  We are closed weekends and major holidays. You have access to a nurse at all times for urgent questions. Please call the main number to the clinic [phone] and follow the prompts.  For any non-urgent questions, you may also contact your provider using MyChart. We now offer e-Visits for anyone 23 and older to request care online for non-urgent symptoms. For details visit [email or web address].   Also download the MyChart app! Go to the app store, search "MyChart", open the app, select [facility], and log in with your MyChart username and password.  Masks are optional in the cancer centers. If you would like for your care team to wear a mask while they are taking care of you, please let them know. For doctor visits, patients may have with them one support person who is at least 72 years old. At this time, visitors are not allowed in the infusion area. Daratumumab; Hyaluronidase Injection What is this medication? DARATUMUMAB; HYALURONIDASE (dar a toom ue mab / hye al ur ON i dase) is a monoclonal antibody. Hyaluronidase is used to improve the effects of daratumumab. It treats certain types of cancer. Some of the cancers treated are multiple myeloma and light-chain amyloidosis. This medicine may be used for other purposes;  ask your health care provider or pharmacist if you have questions. COMMON BRAND NAME(S): DARZALEX FASPRO What should I tell my care team before I take this medication? They need to know if you have any of these conditions: heart disease infection especially a viral infection such as chickenpox, cold sores,  herpes, or hepatitis B lung or breathing disease an unusual or allergic reaction to daratumumab, hyaluronidase, other medicines, foods, dyes, or preservatives pregnant or trying to get pregnant breast-feeding How should I use this medication? This medicine is for injection under the skin. It is given by a health care professional in a hospital or clinic setting. Talk to your pediatrician regarding the use of this medicine in children. Special care may be needed. Overdosage: If you think you have taken too much of this medicine contact a poison control center or emergency room at once. NOTE: This medicine is only for you. Do not share this medicine with others. What if I miss a dose? Keep appointments for follow-up doses as directed. It is important not to miss your dose. Call your doctor or health care professional if you are unable to keep an appointment. What may interact with this medication? Interactions have not been studied. This list may not describe all possible interactions. Give your health care provider a list of all the medicines, herbs, non-prescription drugs, or dietary supplements you use. Also tell them if you smoke, drink alcohol, or use illegal drugs. Some items may interact with your medicine. What should I watch for while using this medication? Your condition will be monitored carefully while you are receiving this medicine. This medicine can cause serious allergic reactions. To reduce your risk, your health care provider may give you other medicine to take before receiving this one. Be sure to follow the directions from your health care provider. This medicine can affect the results of blood tests to match your blood type. These changes can last for up to 6 months after the final dose. Your healthcare provider will do blood tests to match your blood type before you start treatment. Tell all of your healthcare providers that you are being treated with this medicine before  receiving a blood transfusion. This medicine can affect the results of some tests used to determine treatment response; extra tests may be needed to evaluate response. Do not become pregnant while taking this medicine or for 3 months after stopping it. Women should inform their health care provider if they wish to become pregnant or think they might be pregnant. There is a potential for serious side effects to an unborn child. Talk to your health care provider for more information. Do not breast-feed an infant while taking this medicine. What side effects may I notice from receiving this medication? Side effects that you should report to your care team as soon as possible: Allergic reactions--skin rash, itching or hives, swelling of the face, lips, or tongue Blood clot--chest pain, shortness of breath, pain, swelling or warmth in the leg Blurred vision Fast, irregular heartbeat Infection--fever, chills, cough, sore throat, pain or trouble passing urine Injection reactions--dizziness, fast heartbeat, feeling faint or lightheaded, falls, headache, increase in blood pressure, nausea, vomiting, or wheezing or trouble breathing with loud or whistling sounds Low red blood cell counts--trouble breathing, feeling faint, lightheaded or falls, unusually weak or tired Unusual bleeding or bruising Side effects that usually do not require medical attention (report these to your care team if they continue or are bothersome): Back pain Constipation Diarrhea Pain, tingling, numbness  in the hands or feet Pain, redness, or irritation at site where injected Muscle cramp or pain Swelling of the ankles, feet, hands Tiredness Trouble sleeping This list may not describe all possible side effects. Call your doctor for medical advice about side effects. You may report side effects to FDA at 1-800-FDA-1088. Where should I keep my medication? This drug is given in a hospital or clinic and will not be stored at  home. NOTE: This sheet is a summary. It may not cover all possible information. If you have questions about this medicine, talk to your doctor, pharmacist, or health care provider.  2023 Elsevier/Gold Standard (2021-06-01 00:00:00) Bortezomib injection What is this medication? BORTEZOMIB (bor TEZ oh mib) targets proteins in cancer cells and stops the cancer cells from growing. It treats multiple myeloma and mantle cell lymphoma. This medicine may be used for other purposes; ask your health care provider or pharmacist if you have questions. COMMON BRAND NAME(S): Velcade What should I tell my care team before I take this medication? They need to know if you have any of these conditions: dehydration diabetes (high blood sugar) heart disease liver disease tingling of the fingers or toes or other nerve disorder an unusual or allergic reaction to bortezomib, mannitol, boron, other medicines, foods, dyes, or preservatives pregnant or trying to get pregnant breast-feeding How should I use this medication? This medicine is injected into a vein or under the skin. It is given by a health care provider in a hospital or clinic setting. Talk to your health care provider about the use of this medicine in children. Special care may be needed. Overdosage: If you think you have taken too much of this medicine contact a poison control center or emergency room at once. NOTE: This medicine is only for you. Do not share this medicine with others. What if I miss a dose? Keep appointments for follow-up doses. It is important not to miss your dose. Call your health care provider if you are unable to keep an appointment. What may interact with this medication? This medicine may interact with the following medications: ketoconazole rifampin This list may not describe all possible interactions. Give your health care provider a list of all the medicines, herbs, non-prescription drugs, or dietary supplements you  use. Also tell them if you smoke, drink alcohol, or use illegal drugs. Some items may interact with your medicine. What should I watch for while using this medication? Your condition will be monitored carefully while you are receiving this medicine. You may need blood work done while you are taking this medicine. You may get drowsy or dizzy. Do not drive, use machinery, or do anything that needs mental alertness until you know how this medicine affects you. Do not stand up or sit up quickly, especially if you are an older patient. This reduces the risk of dizzy or fainting spells This medicine may increase your risk of getting an infection. Call your health care provider for advice if you get a fever, chills, sore throat, or other symptoms of a cold or flu. Do not treat yourself. Try to avoid being around people who are sick. Check with your health care provider if you have severe diarrhea, nausea, and vomiting, or if you sweat a lot. The loss of too much body fluid may make it dangerous for you to take this medicine. Do not become pregnant while taking this medicine or for 7 months after stopping it. Women should inform their health care provider if they  wish to become pregnant or think they might be pregnant. Men should not father a child while taking this medicine and for 4 months after stopping it. There is a potential for serious harm to an unborn child. Talk to your health care provider for more information. Do not breast-feed an infant while taking this medicine or for 2 months after stopping it. This medicine may make it more difficult to get pregnant or father a child. Talk to your health care provider if you are concerned about your fertility. What side effects may I notice from receiving this medication? Side effects that you should report to your doctor or health care professional as soon as possible: allergic reactions (skin rash; itching or hives; swelling of the face, lips, or  tongue) bleeding (bloody or black, tarry stools; red or dark brown urine; spitting up blood or brown material that looks like coffee grounds; red spots on the skin; unusual bruising or bleeding from the eye, gums, or nose) blurred vision or changes in vision confusion constipation headache heart failure (trouble breathing; fast, irregular heartbeat; sudden weight gain; swelling of the ankles, feet, hands) infection (fever, chills, cough, sore throat, pain or trouble passing urine) lack or loss of appetite liver injury (dark yellow or brown urine; general ill feeling or flu-like symptoms; loss of appetite, right upper belly pain; yellowing of the eyes or skin) low blood pressure (dizziness; feeling faint or lightheaded, falls; unusually weak or tired) muscle cramps pain, redness, or irritation at site where injected pain, tingling, numbness in the hands or feet seizures trouble breathing unusual bruising or bleeding Side effects that usually do not require medical attention (report to your doctor or health care professional if they continue or are bothersome): diarrhea nausea stomach pain trouble sleeping vomiting This list may not describe all possible side effects. Call your doctor for medical advice about side effects. You may report side effects to FDA at 1-800-FDA-1088. Where should I keep my medication? This medicine is given in a hospital or clinic. It will not be stored at home. NOTE: This sheet is a summary. It may not cover all possible information. If you have questions about this medicine, talk to your doctor, pharmacist, or health care provider.  2023 Elsevier/Gold Standard (2020-06-22 00:00:00)

## 2022-02-04 ENCOUNTER — Other Ambulatory Visit: Payer: Self-pay

## 2022-02-06 ENCOUNTER — Other Ambulatory Visit: Payer: Self-pay

## 2022-02-07 ENCOUNTER — Inpatient Hospital Stay (HOSPITAL_COMMUNITY): Payer: Medicare Other

## 2022-02-07 VITALS — BP 138/87 | HR 66 | Temp 97.3°F | Resp 18 | Wt 124.4 lb

## 2022-02-07 DIAGNOSIS — C9000 Multiple myeloma not having achieved remission: Secondary | ICD-10-CM

## 2022-02-07 DIAGNOSIS — Z5112 Encounter for antineoplastic immunotherapy: Secondary | ICD-10-CM | POA: Diagnosis not present

## 2022-02-07 LAB — COMPREHENSIVE METABOLIC PANEL
ALT: 22 U/L (ref 0–44)
AST: 23 U/L (ref 15–41)
Albumin: 4.3 g/dL (ref 3.5–5.0)
Alkaline Phosphatase: 62 U/L (ref 38–126)
Anion gap: 7 (ref 5–15)
BUN: 17 mg/dL (ref 8–23)
CO2: 30 mmol/L (ref 22–32)
Calcium: 9.4 mg/dL (ref 8.9–10.3)
Chloride: 100 mmol/L (ref 98–111)
Creatinine, Ser: 0.76 mg/dL (ref 0.44–1.00)
GFR, Estimated: >60 mL/min (ref >60)
Glucose, Bld: 97 mg/dL (ref 70–99)
Potassium: 4.5 mmol/L (ref 3.5–5.1)
Sodium: 137 mmol/L (ref 135–145)
Total Bilirubin: 0.9 mg/dL (ref 0.3–1.2)
Total Protein: 7 g/dL (ref 6.5–8.1)

## 2022-02-07 LAB — CBC WITH DIFFERENTIAL/PLATELET
Abs Immature Granulocytes: 0.01 10*3/uL (ref 0.00–0.07)
Basophils Absolute: 0.1 10*3/uL (ref 0.0–0.1)
Basophils Relative: 1 %
Eosinophils Absolute: 0.3 10*3/uL (ref 0.0–0.5)
Eosinophils Relative: 4 %
HCT: 36.5 % (ref 36.0–46.0)
Hemoglobin: 11.6 g/dL — ABNORMAL LOW (ref 12.0–15.0)
Immature Granulocytes: 0 %
Lymphocytes Relative: 36 %
Lymphs Abs: 2.3 10*3/uL (ref 0.7–4.0)
MCH: 28.1 pg (ref 26.0–34.0)
MCHC: 31.8 g/dL (ref 30.0–36.0)
MCV: 88.4 fL (ref 80.0–100.0)
Monocytes Absolute: 0.9 10*3/uL (ref 0.1–1.0)
Monocytes Relative: 14 %
Neutro Abs: 2.8 10*3/uL (ref 1.7–7.7)
Neutrophils Relative %: 45 %
Platelets: 248 10*3/uL (ref 150–400)
RBC: 4.13 MIL/uL (ref 3.87–5.11)
RDW: 18.6 % — ABNORMAL HIGH (ref 11.5–15.5)
WBC: 6.3 10*3/uL (ref 4.0–10.5)
nRBC: 0 % (ref 0.0–0.2)

## 2022-02-07 LAB — MAGNESIUM: Magnesium: 2.1 mg/dL (ref 1.7–2.4)

## 2022-02-07 MED ORDER — ZOLEDRONIC ACID 4 MG/100ML IV SOLN
4.0000 mg | Freq: Once | INTRAVENOUS | Status: AC
  Administered 2022-02-07: 4 mg via INTRAVENOUS
  Filled 2022-02-07: qty 100

## 2022-02-07 MED ORDER — PROCHLORPERAZINE MALEATE 10 MG PO TABS
10.0000 mg | ORAL_TABLET | Freq: Once | ORAL | Status: AC
  Administered 2022-02-07: 10 mg via ORAL
  Filled 2022-02-07: qty 1

## 2022-02-07 MED ORDER — BORTEZOMIB CHEMO SQ INJECTION 3.5 MG (2.5MG/ML)
2.0000 mg | Freq: Once | INTRAMUSCULAR | Status: AC
  Administered 2022-02-07: 2 mg via SUBCUTANEOUS
  Filled 2022-02-07: qty 0.8

## 2022-02-07 MED ORDER — SODIUM CHLORIDE 0.9 % IV SOLN: Freq: Once | INTRAVENOUS | Status: AC

## 2022-02-07 NOTE — Patient Instructions (Signed)
[facility]  Discharge Instructions: Thank you for choosing [facility] to provide your oncology and hematology care.  If you have a lab appointment with the Cancer Center, please come in thru the Main Entrance and check in at the main information desk.  Wear comfortable clothing and clothing appropriate for easy access to any Portacath or PICC line.   We strive to give you quality time with your provider. You may need to reschedule your appointment if you arrive late (15 or more minutes).  Arriving late affects you and other patients whose appointments are after yours.  Also, if you miss three or more appointments without notifying the office, you may be dismissed from the clinic at the provider's discretion.      For prescription refill requests, have your pharmacy contact our office and allow 72 hours for refills to be completed.         To help prevent nausea and vomiting after your treatment, we encourage you to take your nausea medication as directed.  BELOW ARE SYMPTOMS THAT SHOULD BE REPORTED IMMEDIATELY: *FEVER GREATER THAN 100.4 F (38 C) OR HIGHER *CHILLS OR SWEATING *NAUSEA AND VOMITING THAT IS NOT CONTROLLED WITH YOUR NAUSEA MEDICATION *UNUSUAL SHORTNESS OF BREATH *UNUSUAL BRUISING OR BLEEDING *URINARY PROBLEMS (pain or burning when urinating, or frequent urination) *BOWEL PROBLEMS (unusual diarrhea, constipation, pain near the anus) TENDERNESS IN MOUTH AND THROAT WITH OR WITHOUT PRESENCE OF ULCERS (sore throat, sores in mouth, or a toothache) UNUSUAL RASH, SWELLING OR PAIN  UNUSUAL VAGINAL DISCHARGE OR ITCHING   Items with * indicate a potential emergency and should be followed up as soon as possible or go to the Emergency Department if any problems should occur.  Please show the CHEMOTHERAPY ALERT CARD or IMMUNOTHERAPY ALERT CARD at check-in to the Emergency Department and triage nurse.  Should you have questions after your visit or need to  cancel or reschedule your appointment, please contact [facility] [phone]  and follow the prompts.  Office hours are 8:00 a.m. to 4:30 p.m. Monday - Friday. Please note that voicemails left after 4:00 p.m. may not be returned until the following business day.  We are closed weekends and major holidays. You have access to a nurse at all times for urgent questions. Please call the main number to the clinic [phone] and follow the prompts.  For any non-urgent questions, you may also contact your provider using MyChart. We now offer e-Visits for anyone 18 and older to request care online for non-urgent symptoms. For details visit [email or web address].   Also download the MyChart app! Go to the app store, search "MyChart", open the app, select [facility], and log in with your MyChart username and password.  Masks are optional in the cancer centers. If you would like for your care team to wear a mask while they are taking care of you, please let them know. For doctor visits, patients may have with them one support person who is at least 72 years old. At this time, visitors are not allowed in the infusion area.  

## 2022-02-07 NOTE — Progress Notes (Signed)
Patient with weight loss - New BSA: 1.56 m2  Velcade 1.3 mg/m2 will now be given at 2 mg.  V.O. Dr Rhys Martini, PharmD

## 2022-02-07 NOTE — Progress Notes (Signed)
Patient presents today for Velcade injection and Zometa infusion.  Patient is in satisfactory condition with no new complaints voiced.  Vital signs are stable.  Labs reviewed and all are within treatment parameters.  We will proceed with treatment per MD orders.  Patient tolerated infusion and injection well with no complaints voiced.  Patient left via wheelchair in stable condition.  Vital signs stable at discharge.  Follow up as scheduled.

## 2022-02-12 ENCOUNTER — Other Ambulatory Visit: Payer: Self-pay

## 2022-02-12 MED ORDER — LENALIDOMIDE 20 MG PO CAPS: 20.0000 mg | ORAL_CAPSULE | Freq: Every day | ORAL | 0 refills | Status: DC

## 2022-02-12 NOTE — Telephone Encounter (Signed)
Chart reviewed. Revlimid refilled per last office note with Dr. Katragadda.  

## 2022-02-13 ENCOUNTER — Other Ambulatory Visit: Payer: Self-pay

## 2022-02-13 DIAGNOSIS — C9000 Multiple myeloma not having achieved remission: Secondary | ICD-10-CM

## 2022-02-14 ENCOUNTER — Inpatient Hospital Stay: Payer: Medicare Other | Attending: Hematology

## 2022-02-14 ENCOUNTER — Inpatient Hospital Stay: Payer: Medicare Other

## 2022-02-14 VITALS — BP 126/71 | HR 67 | Temp 96.4°F | Resp 18 | Wt 124.8 lb

## 2022-02-14 DIAGNOSIS — M549 Dorsalgia, unspecified: Secondary | ICD-10-CM | POA: Diagnosis not present

## 2022-02-14 DIAGNOSIS — C9000 Multiple myeloma not having achieved remission: Secondary | ICD-10-CM | POA: Insufficient documentation

## 2022-02-14 DIAGNOSIS — A319 Mycobacterial infection, unspecified: Secondary | ICD-10-CM | POA: Diagnosis not present

## 2022-02-14 DIAGNOSIS — Z5112 Encounter for antineoplastic immunotherapy: Secondary | ICD-10-CM | POA: Insufficient documentation

## 2022-02-14 LAB — COMPREHENSIVE METABOLIC PANEL
ALT: 20 U/L (ref 0–44)
AST: 22 U/L (ref 15–41)
Albumin: 4.1 g/dL (ref 3.5–5.0)
Alkaline Phosphatase: 71 U/L (ref 38–126)
Anion gap: 6 (ref 5–15)
BUN: 13 mg/dL (ref 8–23)
CO2: 29 mmol/L (ref 22–32)
Calcium: 9.1 mg/dL (ref 8.9–10.3)
Chloride: 102 mmol/L (ref 98–111)
Creatinine, Ser: 0.79 mg/dL (ref 0.44–1.00)
GFR, Estimated: >60 mL/min (ref >60)
Glucose, Bld: 85 mg/dL (ref 70–99)
Potassium: 4.5 mmol/L (ref 3.5–5.1)
Sodium: 137 mmol/L (ref 135–145)
Total Bilirubin: 0.7 mg/dL (ref 0.3–1.2)
Total Protein: 7.1 g/dL (ref 6.5–8.1)

## 2022-02-14 LAB — CBC WITH DIFFERENTIAL/PLATELET
Abs Immature Granulocytes: 0.01 10*3/uL (ref 0.00–0.07)
Basophils Absolute: 0.1 10*3/uL (ref 0.0–0.1)
Basophils Relative: 1 %
Eosinophils Absolute: 0.6 10*3/uL — ABNORMAL HIGH (ref 0.0–0.5)
Eosinophils Relative: 11 %
HCT: 35 % — ABNORMAL LOW (ref 36.0–46.0)
Hemoglobin: 11.3 g/dL — ABNORMAL LOW (ref 12.0–15.0)
Immature Granulocytes: 0 %
Lymphocytes Relative: 24 %
Lymphs Abs: 1.3 10*3/uL (ref 0.7–4.0)
MCH: 28.5 pg (ref 26.0–34.0)
MCHC: 32.3 g/dL (ref 30.0–36.0)
MCV: 88.4 fL (ref 80.0–100.0)
Monocytes Absolute: 0.4 10*3/uL (ref 0.1–1.0)
Monocytes Relative: 7 %
Neutro Abs: 3.2 10*3/uL (ref 1.7–7.7)
Neutrophils Relative %: 57 %
Platelets: 311 10*3/uL (ref 150–400)
RBC: 3.96 MIL/uL (ref 3.87–5.11)
RDW: 18.9 % — ABNORMAL HIGH (ref 11.5–15.5)
WBC: 5.6 10*3/uL (ref 4.0–10.5)
nRBC: 0 % (ref 0.0–0.2)

## 2022-02-14 LAB — MAGNESIUM: Magnesium: 2 mg/dL (ref 1.7–2.4)

## 2022-02-14 LAB — LACTATE DEHYDROGENASE: LDH: 143 U/L (ref 98–192)

## 2022-02-14 MED ORDER — DIPHENHYDRAMINE HCL 25 MG PO CAPS
50.0000 mg | ORAL_CAPSULE | Freq: Once | ORAL | Status: AC
  Administered 2022-02-14: 50 mg via ORAL
  Filled 2022-02-14: qty 2

## 2022-02-14 MED ORDER — PROCHLORPERAZINE MALEATE 10 MG PO TABS
10.0000 mg | ORAL_TABLET | Freq: Once | ORAL | Status: AC
  Administered 2022-02-14: 10 mg via ORAL
  Filled 2022-02-14: qty 1

## 2022-02-14 MED ORDER — DEXAMETHASONE 4 MG PO TABS
20.0000 mg | ORAL_TABLET | Freq: Once | ORAL | Status: AC
  Administered 2022-02-14: 20 mg via ORAL
  Filled 2022-02-14: qty 5

## 2022-02-14 MED ORDER — ACETAMINOPHEN 325 MG PO TABS
650.0000 mg | ORAL_TABLET | Freq: Once | ORAL | Status: AC
  Administered 2022-02-14: 650 mg via ORAL
  Filled 2022-02-14: qty 2

## 2022-02-14 MED ORDER — BORTEZOMIB CHEMO SQ INJECTION 3.5 MG (2.5MG/ML)
1.3000 mg/m2 | Freq: Once | INTRAMUSCULAR | Status: AC
  Administered 2022-02-14: 2 mg via SUBCUTANEOUS
  Filled 2022-02-14: qty 0.8

## 2022-02-14 MED ORDER — DARATUMUMAB-HYALURONIDASE-FIHJ 1800-30000 MG-UT/15ML ~~LOC~~ SOLN
1800.0000 mg | Freq: Once | SUBCUTANEOUS | Status: AC
  Administered 2022-02-14: 1800 mg via SUBCUTANEOUS
  Filled 2022-02-14: qty 15

## 2022-02-14 NOTE — Patient Instructions (Signed)
[place]  Discharge Instructions: Thank you for choosing [facility] to provide your oncology and hematology care.  If you have a lab appointment with the [facility], please come in thru the Main Entrance and check in at the main information desk.  Wear comfortable clothing and clothing appropriate for easy access to any Portacath or PICC line.   We strive to give you quality time with your provider. You may need to reschedule your appointment if you arrive late (15 or more minutes).  Arriving late affects you and other patients whose appointments are after yours.  Also, if you miss three or more appointments without notifying the office, you may be dismissed from the clinic at the provider's discretion.      For prescription refill requests, have your pharmacy contact our office and allow 72 hours for refills to be completed.    Today you received the following chemotherapy and/or immunotherapy agents dara and velcade.  Bortezomib Injection What is this medication? BORTEZOMIB (bor TEZ oh mib) treats lymphoma. It may also be used to treat multiple myeloma, a type of bone marrow cancer. It works by blocking a protein that causes cancer cells to grow and multiply. This helps to slow or stop the spread of cancer cells. This medicine may be used for other purposes; ask your health care provider or pharmacist if you have questions. COMMON BRAND NAME(S): Velcade What should I tell my care team before I take this medication? They need to know if you have any of these conditions: Dehydration Diabetes Heart disease Liver disease Tingling of the fingers or toes or other nerve disorder An unusual or allergic reaction to bortezomib, other medications, foods, dyes, or preservatives If you or your partner are pregnant or trying to get pregnant Breastfeeding How should I use this medication? This medication is injected into a vein or under the skin. It is given  by your care team in a hospital or clinic setting. Talk to your care team about the use of this medication in children. Special care may be needed. Overdosage: If you think you have taken too much of this medicine contact a poison control center or emergency room at once. NOTE: This medicine is only for you. Do not share this medicine with others. What if I miss a dose? Keep appointments for follow-up doses. It is important not to miss your dose. Call your care team if you are unable to keep an appointment. What may interact with this medication? Ketoconazole Rifampin This list may not describe all possible interactions. Give your health care provider a list of all the medicines, herbs, non-prescription drugs, or dietary supplements you use. Also tell them if you smoke, drink alcohol, or use illegal drugs. Some items may interact with your medicine. What should I watch for while using this medication? Your condition will be monitored carefully while you are receiving this medication. You may need blood work while taking this medication. This medication may affect your coordination, reaction time, or judgment. Do not drive or operate machinery until you know how this medication affects you. Sit up or stand slowly to reduce the risk of dizzy or fainting spells. Drinking alcohol with this medication can increase the risk of these side effects. This medication may increase your risk of getting an infection. Call your care team for advice if you get a fever, chills, sore throat, or other symptoms of a cold or flu. Do not treat yourself. Try to avoid  being around people who are sick. Check with your care team if you have severe diarrhea, nausea, and vomiting, or if you sweat a lot. The loss of too much body fluid may make it dangerous for you to take this medication. Talk to your care team if you may be pregnant. Serious birth defects can occur if you take this medication during pregnancy and for 7 months  after the last dose. You will need a negative pregnancy test before starting this medication. Contraception is recommended while taking this medication and for 7 months after the last dose. Your care team can help you find the option that works for you. If your partner can get pregnant, use a condom during sex while taking this medication and for 4 months after the last dose. Do not breastfeed while taking this medication and for 2 months after the last dose. This medication may cause infertility. Talk to your care team if you are concerned about your fertility. What side effects may I notice from receiving this medication? Side effects that you should report to your care team as soon as possible: Allergic reactions--skin rash, itching, hives, swelling of the face, lips, tongue, or throat Bleeding--bloody or black, tar-like stools, vomiting blood or brown material that looks like coffee grounds, red or dark brown urine, small red or purple spots on skin, unusual bruising or bleeding Bleeding in the brain--severe headache, stiff neck, confusion, dizziness, change in vision, numbness or weakness of the face, arm, or leg, trouble speaking, trouble walking, vomiting Bowel blockage--stomach cramping, unable to have a bowel movement or pass gas, loss of appetite, vomiting Heart failure--shortness of breath, swelling of the ankles, feet, or hands, sudden weight gain, unusual weakness or fatigue Infection--fever, chills, cough, sore throat, wounds that don't heal, pain or trouble when passing urine, general feeling of discomfort or being unwell Liver injury--right upper belly pain, loss of appetite, nausea, light-colored stool, dark yellow or brown urine, yellowing skin or eyes, unusual weakness or fatigue Low blood pressure--dizziness, feeling faint or lightheaded, blurry vision Lung injury--shortness of breath or trouble breathing, cough, spitting up blood, chest pain, fever Pain, tingling, or numbness in  the hands or feet Severe or prolonged diarrhea Stomach pain, bloody diarrhea, pale skin, unusual weakness or fatigue, decrease in the amount of urine, which may be signs of hemolytic uremic syndrome Sudden and severe headache, confusion, change in vision, seizures, which may be signs of posterior reversible encephalopathy syndrome (PRES) TTP--purple spots on the skin or inside the mouth, pale skin, yellowing skin or eyes, unusual weakness or fatigue, fever, fast or irregular heartbeat, confusion, change in vision, trouble speaking, trouble walking Tumor lysis syndrome (TLS)--nausea, vomiting, diarrhea, decrease in the amount of urine, dark urine, unusual weakness or fatigue, confusion, muscle pain or cramps, fast or irregular heartbeat, joint pain Side effects that usually do not require medical attention (report to your care team if they continue or are bothersome): Constipation Diarrhea Fatigue Loss of appetite Nausea This list may not describe all possible side effects. Call your doctor for medical advice about side effects. You may report side effects to FDA at 1-800-FDA-1088. Where should I keep my medication? This medication is given in a hospital or clinic. It will not be stored at home. NOTE: This sheet is a summary. It may not cover all possible information. If you have questions about this medicine, talk to your doctor, pharmacist, or health care provider.  2023 Elsevier/Gold Standard (2021-11-28 00:00:00) Daratumumab Injection What is this medication? DARATUMUMAB (  dar a toom ue mab) treats multiple myeloma, a type of bone marrow cancer. It works by helping your immune system slow or stop the spread of cancer cells. It is a monoclonal antibody. This medicine may be used for other purposes; ask your health care provider or pharmacist if you have questions. COMMON BRAND NAME(S): DARZALEX What should I tell my care team before I take this medication? They need to know if you have any  of these conditions: Hereditary fructose intolerance Infection, such as chickenpox, herpes, hepatitis B virus Lung or breathing disease, such as asthma, COPD An unusual or allergic reaction to daratumumab, sorbitol, other medications, foods, dyes, or preservatives Pregnant or trying to get pregnant Breast-feeding How should I use this medication? This medication is injected into a vein. It is given by your care team in a hospital or clinic setting. Talk to your care team about the use of this medication in children. Special care may be needed. Overdosage: If you think you have taken too much of this medicine contact a poison control center or emergency room at once. NOTE: This medicine is only for you. Do not share this medicine with others. What if I miss a dose? Keep appointments for follow-up doses. It is important not to miss your dose. Call your care team if you are unable to keep an appointment. What may interact with this medication? Interactions have not been studied. This list may not describe all possible interactions. Give your health care provider a list of all the medicines, herbs, non-prescription drugs, or dietary supplements you use. Also tell them if you smoke, drink alcohol, or use illegal drugs. Some items may interact with your medicine. What should I watch for while using this medication? Your condition will be monitored carefully while you are receiving this medication. This medication can cause serious allergic reactions. To reduce your risk, your care team may give you other medication to take before receiving this one. Be sure to follow the directions from your care team. This medication can affect the results of blood tests to match your blood type. These changes can last for up to 6 months after the final dose. Your care team will do blood tests to match your blood type before you start treatment. Tell all of your care team that you are being treated with this  medication before receiving a blood transfusion. This medication can affect the results of some tests used to determine treatment response; extra tests may be needed to evaluate response. Talk to your care team if you wish to become pregnant or think you are pregnant. This medication can cause serious birth defects if taken during pregnancy and for 3 months after the last dose. A reliable form of contraception is recommended while taking this medication and for 3 months after the last dose. Talk to your care team about effective forms of contraception. Do not breast-feed while taking this medication. What side effects may I notice from receiving this medication? Side effects that you should report to your care team as soon as possible: Allergic reactions--skin rash, itching, hives, swelling of the face, lips, tongue, or throat Infection--fever, chills, cough, sore throat, wounds that don't heal, pain or trouble when passing urine, general feeling of discomfort or being unwell Infusion reactions--chest pain, shortness of breath or trouble breathing, feeling faint or lightheaded Unusual bruising or bleeding Side effects that usually do not require medical attention (report to your care team if they continue or are bothersome): Constipation Diarrhea Fatigue  Nausea Pain, tingling, or numbness in the hands or feet Swelling of the ankles, hands, or feet This list may not describe all possible side effects. Call your doctor for medical advice about side effects. You may report side effects to FDA at 1-800-FDA-1088. Where should I keep my medication? This medication is given in a hospital or clinic. It will not be stored at home. NOTE: This sheet is a summary. It may not cover all possible information. If you have questions about this medicine, talk to your doctor, pharmacist, or health care provider.  2023 Elsevier/Gold Standard (2014-05-30 00:00:00)       To help prevent nausea and vomiting after  your treatment, we encourage you to take your nausea medication as directed.  BELOW ARE SYMPTOMS THAT SHOULD BE REPORTED IMMEDIATELY: *FEVER GREATER THAN 100.4 F (38 C) OR HIGHER *CHILLS OR SWEATING *NAUSEA AND VOMITING THAT IS NOT CONTROLLED WITH YOUR NAUSEA MEDICATION *UNUSUAL SHORTNESS OF BREATH *UNUSUAL BRUISING OR BLEEDING *URINARY PROBLEMS (pain or burning when urinating, or frequent urination) *BOWEL PROBLEMS (unusual diarrhea, constipation, pain near the anus) TENDERNESS IN MOUTH AND THROAT WITH OR WITHOUT PRESENCE OF ULCERS (sore throat, sores in mouth, or a toothache) UNUSUAL RASH, SWELLING OR PAIN  UNUSUAL VAGINAL DISCHARGE OR ITCHING   Items with * indicate a potential emergency and should be followed up as soon as possible or go to the Emergency Department if any problems should occur.  Please show the CHEMOTHERAPY ALERT CARD or IMMUNOTHERAPY ALERT CARD at check-in to the Emergency Department and triage nurse.  Should you have questions after your visit or need to cancel or reschedule your appointment, please contact [facility] [phone]  and follow the prompts.  Office hours are 8:00 a.m. to 4:30 p.m. Monday - Friday. Please note that voicemails left after 4:00 p.m. may not be returned until the following business day.  We are closed weekends and major holidays. You have access to a nurse at all times for urgent questions. Please call the main number to the clinic [phone] and follow the prompts.  For any non-urgent questions, you may also contact your provider using MyChart. We now offer e-Visits for anyone 58 and older to request care online for non-urgent symptoms. For details visit [email or web address].   Also download the MyChart app! Go to the app store, search "MyChart", open the app, select [facility], and log in with your MyChart username and password.  Masks are optional in the cancer centers. If you would like for your care team to  wear a mask while they are taking care of you, please let them know. For doctor visits, patients may have with them one support person who is at least 72 years old. At this time, visitors are not allowed in the infusion area.

## 2022-02-14 NOTE — Progress Notes (Signed)
Patient tolerated Daratumumab and Velcade injection with no complaints voiced.  See MAR for details.  Labs reviewed. Injection site clean and dry with no bruising or swelling noted at site.  Band aid applied.  Vss with discharge and left in satisfactory condition with no s/s of distress noted.

## 2022-02-15 LAB — KAPPA/LAMBDA LIGHT CHAINS
Kappa free light chain: 15.4 mg/L (ref 3.3–19.4)
Kappa, lambda light chain ratio: 2.17 — ABNORMAL HIGH (ref 0.26–1.65)
Lambda free light chains: 7.1 mg/L (ref 5.7–26.3)

## 2022-02-18 LAB — IMMUNOFIXATION ELECTROPHORESIS
IgA: 78 mg/dL (ref 64–422)
IgG (Immunoglobin G), Serum: 600 mg/dL (ref 586–1602)
IgM (Immunoglobulin M), Srm: 35 mg/dL (ref 26–217)
Total Protein ELP: 6.3 g/dL (ref 6.0–8.5)

## 2022-02-18 LAB — PROTEIN ELECTROPHORESIS, SERUM
A/G Ratio: 1.4 (ref 0.7–1.7)
Albumin ELP: 3.7 g/dL (ref 2.9–4.4)
Alpha-1-Globulin: 0.3 g/dL (ref 0.0–0.4)
Alpha-2-Globulin: 0.7 g/dL (ref 0.4–1.0)
Beta Globulin: 0.9 g/dL (ref 0.7–1.3)
Gamma Globulin: 0.6 g/dL (ref 0.4–1.8)
Globulin, Total: 2.6 g/dL (ref 2.2–3.9)
M-Spike, %: 0.2 g/dL — ABNORMAL HIGH
Total Protein ELP: 6.3 g/dL (ref 6.0–8.5)

## 2022-02-21 ENCOUNTER — Inpatient Hospital Stay (HOSPITAL_BASED_OUTPATIENT_CLINIC_OR_DEPARTMENT_OTHER): Payer: Medicare Other | Admitting: Hematology

## 2022-02-21 ENCOUNTER — Inpatient Hospital Stay: Payer: Medicare Other

## 2022-02-21 ENCOUNTER — Encounter: Payer: Self-pay | Admitting: Hematology

## 2022-02-21 VITALS — BP 123/66 | HR 68 | Temp 98.1°F | Resp 16 | Wt 124.4 lb

## 2022-02-21 DIAGNOSIS — C9000 Multiple myeloma not having achieved remission: Secondary | ICD-10-CM | POA: Diagnosis not present

## 2022-02-21 DIAGNOSIS — Z5112 Encounter for antineoplastic immunotherapy: Secondary | ICD-10-CM | POA: Diagnosis not present

## 2022-02-21 LAB — CBC WITH DIFFERENTIAL/PLATELET
Abs Immature Granulocytes: 0.01 10*3/uL (ref 0.00–0.07)
Basophils Absolute: 0.1 10*3/uL (ref 0.0–0.1)
Basophils Relative: 1 %
Eosinophils Absolute: 0.6 10*3/uL — ABNORMAL HIGH (ref 0.0–0.5)
Eosinophils Relative: 11 %
HCT: 32.7 % — ABNORMAL LOW (ref 36.0–46.0)
Hemoglobin: 10.6 g/dL — ABNORMAL LOW (ref 12.0–15.0)
Immature Granulocytes: 0 %
Lymphocytes Relative: 30 %
Lymphs Abs: 1.8 10*3/uL (ref 0.7–4.0)
MCH: 28.6 pg (ref 26.0–34.0)
MCHC: 32.4 g/dL (ref 30.0–36.0)
MCV: 88.4 fL (ref 80.0–100.0)
Monocytes Absolute: 0.8 10*3/uL (ref 0.1–1.0)
Monocytes Relative: 14 %
Neutro Abs: 2.6 10*3/uL (ref 1.7–7.7)
Neutrophils Relative %: 44 %
Platelets: 247 10*3/uL (ref 150–400)
RBC: 3.7 MIL/uL — ABNORMAL LOW (ref 3.87–5.11)
RDW: 18.6 % — ABNORMAL HIGH (ref 11.5–15.5)
WBC: 6 10*3/uL (ref 4.0–10.5)
nRBC: 0 % (ref 0.0–0.2)

## 2022-02-21 LAB — COMPREHENSIVE METABOLIC PANEL
ALT: 20 U/L (ref 0–44)
AST: 23 U/L (ref 15–41)
Albumin: 3.8 g/dL (ref 3.5–5.0)
Alkaline Phosphatase: 62 U/L (ref 38–126)
Anion gap: 6 (ref 5–15)
BUN: 12 mg/dL (ref 8–23)
CO2: 26 mmol/L (ref 22–32)
Calcium: 8.5 mg/dL — ABNORMAL LOW (ref 8.9–10.3)
Chloride: 104 mmol/L (ref 98–111)
Creatinine, Ser: 0.76 mg/dL (ref 0.44–1.00)
GFR, Estimated: >60 mL/min (ref >60)
Glucose, Bld: 106 mg/dL — ABNORMAL HIGH (ref 70–99)
Potassium: 4.3 mmol/L (ref 3.5–5.1)
Sodium: 136 mmol/L (ref 135–145)
Total Bilirubin: 0.6 mg/dL (ref 0.3–1.2)
Total Protein: 6.7 g/dL (ref 6.5–8.1)

## 2022-02-21 LAB — MAGNESIUM: Magnesium: 1.9 mg/dL (ref 1.7–2.4)

## 2022-02-21 MED ORDER — BORTEZOMIB CHEMO SQ INJECTION 3.5 MG (2.5MG/ML)
1.3000 mg/m2 | Freq: Once | INTRAMUSCULAR | Status: AC
  Administered 2022-02-21: 2 mg via SUBCUTANEOUS
  Filled 2022-02-21: qty 0.8

## 2022-02-21 MED ORDER — PROCHLORPERAZINE MALEATE 10 MG PO TABS
10.0000 mg | ORAL_TABLET | Freq: Once | ORAL | Status: AC
  Administered 2022-02-21: 10 mg via ORAL
  Filled 2022-02-21: qty 1

## 2022-02-21 NOTE — Patient Instructions (Signed)
[place]  Discharge Instructions: Thank you for choosing [facility] to provide your oncology and hematology care.  If you have a lab appointment with the [facility], please come in thru the Main Entrance and check in at the main information desk.  Wear comfortable clothing and clothing appropriate for easy access to any Portacath or PICC line.   We strive to give you quality time with your provider. You may need to reschedule your appointment if you arrive late (15 or more minutes).  Arriving late affects you and other patients whose appointments are after yours.  Also, if you miss three or more appointments without notifying the office, you may be dismissed from the clinic at the provider's discretion.      For prescription refill requests, have your pharmacy contact our office and allow 72 hours for refills to be completed.    Today you received the following chemotherapy and/or immunotherapy agents Velcade      To help prevent nausea and vomiting after your treatment, we encourage you to take your nausea medication as directed.  BELOW ARE SYMPTOMS THAT SHOULD BE REPORTED IMMEDIATELY: *FEVER GREATER THAN 100.4 F (38 C) OR HIGHER *CHILLS OR SWEATING *NAUSEA AND VOMITING THAT IS NOT CONTROLLED WITH YOUR NAUSEA MEDICATION *UNUSUAL SHORTNESS OF BREATH *UNUSUAL BRUISING OR BLEEDING *URINARY PROBLEMS (pain or burning when urinating, or frequent urination) *BOWEL PROBLEMS (unusual diarrhea, constipation, pain near the anus) TENDERNESS IN MOUTH AND THROAT WITH OR WITHOUT PRESENCE OF ULCERS (sore throat, sores in mouth, or a toothache) UNUSUAL RASH, SWELLING OR PAIN  UNUSUAL VAGINAL DISCHARGE OR ITCHING   Items with * indicate a potential emergency and should be followed up as soon as possible or go to the Emergency Department if any problems should occur.  Please show the CHEMOTHERAPY ALERT CARD or IMMUNOTHERAPY ALERT CARD at check-in to the Emergency  Department and triage nurse.  Should you have questions after your visit or need to cancel or reschedule your appointment, please contact [facility] [phone]  and follow the prompts.  Office hours are 8:00 a.m. to 4:30 p.m. Monday - Friday. Please note that voicemails left after 4:00 p.m. may not be returned until the following business day.  We are closed weekends and major holidays. You have access to a nurse at all times for urgent questions. Please call the main number to the clinic [phone] and follow the prompts.  For any non-urgent questions, you may also contact your provider using MyChart. We now offer e-Visits for anyone 30 and older to request care online for non-urgent symptoms. For details visit [email or web address].   Also download the MyChart app! Go to the app store, search "MyChart", open the app, select [facility], and log in with your MyChart username and password.  Masks are optional in the cancer centers. If you would like for your care team to wear a mask while they are taking care of you, please let them know. For doctor visits, patients may have with them one support person who is at least 72 years old. At this time, visitors are not allowed in the infusion area.

## 2022-02-21 NOTE — Patient Instructions (Addendum)
[place] at [facility] Discharge Instructions   You were seen and examined today by Dr. Delton Coombes.  He reviewed the results of your lab work which are normal/stable. 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 We will proceed with three more weeks of treatment and then proceed with maintenance therapy with Revlimid only (10 mg 3 weeks on and one week off).   We will proceed with your treatment today.   Return as scheduled.    Thank you for choosing [place] at [facility] to provide your oncology and hematology care.  To afford each patient quality time with our provider, please arrive at least 15 minutes before your scheduled appointment time.   If you have a lab appointment with the [facility] please come in thru the Main Entrance and check in at the main information desk.  You need to re-schedule your appointment should you arrive 10 or more minutes late.  We strive to give you quality time with our providers, and arriving late affects you and other patients whose appointments are after yours.  Also, if you no show three or more times for appointments you may be dismissed from the clinic at the providers discretion.     Again, thank you for choosing [facility].  Our hope is that these requests will decrease the amount of time that you wait before being seen by our physicians.       _____________________________________________________________  Should you have questions after your visit to [facility], please contact our office at [phone] and follow the prompts.  Our office hours are 8:00 a.m. and 4:30 p.m. Monday - Friday.  Please note that voicemails left after 4:00 p.m. may not be returned until the following business day.  We are closed weekends and major holidays.  You do have access to a nurse 24-7, just call the main number to the clinic [phone] and do not press any options, hold on the  line and a nurse will answer the phone.    For prescription refill requests, have your pharmacy contact our office and allow 72 hours.    Due to Covid, you will need to wear a mask upon entering the hospital. If you do not have a mask, a mask will be given to you at the Main Entrance upon arrival. For doctor visits, patients may have 1 support person age 82 or older with them. For treatment visits, patients can not have anyone with them due to social distancing guidelines and our immunocompromised population.

## 2022-02-21 NOTE — Progress Notes (Signed)
[place], [place] [id number]   CLINIC:  Medical Oncology/Hematology  PCP:  Yvone Neu, MD [facility] [place] [address] /* [phone]   REASON FOR VISIT:  Follow-up for multiple myeloma  PRIOR THERAPY: none  NGS Results: not done  CURRENT THERAPY: DaraVRd (Daratumumab SQ) q21d x 6 Cycles (Induction/Consolidation)  BRIEF ONCOLOGIC HISTORY:  Oncology History  Multiple myeloma ([place])  07/17/2021 Initial Diagnosis   Multiple myeloma ([place])   07/26/2021 -  Chemotherapy   Patient is on Treatment Plan : MYELOMA NEWLY DIAGNOSED TRANSPLANT CANDIDATE DaraVRd (Daratumumab SQ) q21d x 6 Cycles (Induction/Consolidation)       CANCER STAGING:  Cancer Staging  Multiple myeloma ([place]) Staging form: Multiple Myeloma, AJCC 6th Edition - Clinical stage from 07/17/2021: Stage IA - Unsigned   INTERVAL HISTORY:  Ms. ANAIH BRANDER, a 71 y.o. female, seen for follow-up of multiple myeloma.  She had a bad 1 week with some diarrhea, nausea and vomiting.  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  Denies any tingling or numbness in the extremities.  Reports some easy bruising on the upper extremities.   REVIEW OF SYSTEMS:  Review of Systems  Constitutional:  Negative for appetite change, fatigue and fever.  Respiratory:  Positive for cough (dry). Negative for shortness of breath.   Gastrointestinal:  Positive for diarrhea and nausea. Negative for abdominal pain.  Musculoskeletal:  Positive for back pain (5/10).  Neurological:  Negative for numbness.  Hematological:  Bruises/bleeds easily.  All other systems reviewed and are negative.   PAST MEDICAL/SURGICAL HISTORY:  Past Medical History:  Diagnosis Date   Arthritis    osteoarthritis   Back pain    Bronchiectasis (HCC)    Cancer (HCC)    basal cell nose   Chronic cough    Complication of  anesthesia    Dyspnea    Gastrointestinal symptoms    GERD (gastroesophageal reflux disease)    Glaucoma    History of hiatal hernia    Hyperlipemia    Hypertension    Multiple myeloma (HCC)    Osteopenia    Pneumonia    PONV (postoperative nausea and vomiting)    "only one time"   Spondylisthesis    Past Surgical History:  Procedure Laterality Date   ABDOMINAL EXPOSURE N/A 12/29/2018   Procedure: ABDOMINAL EXPOSURE;  Surgeon: Angelia Mould, MD;  Location: [facility];  Service: Vascular;  Laterality: N/A;   ABDOMINOPLASTY  2002   ABDOMINOPLASTY  1970's   ANKLE SURGERY Left 10/2015   ANTERIOR LATERAL LUMBAR FUSION WITH PERCUTANEOUS SCREW 3 LEVEL Left 12/29/2018   Procedure: Lumbar Two to Lumbar Five Anterolateral lumbar interbody fusion;  Surgeon: Erline Levine, MD;  Location: [facility];  Service: Neurosurgery;  Laterality: Left;  anterolateral   ANTERIOR LUMBAR FUSION N/A 12/29/2018   Procedure: Lumbar Five Sacral One Anterior lumbar interbody fusion;  Surgeon: Erline Levine, MD;  Location: [facility];  Service: Neurosurgery;  Laterality: N/A;  anterior approach   BASAL CELL CARCINOMA EXCISION  06/2018   nose   BLEPHAROPLASTY Bilateral 1999   BREAST ENHANCEMENT SURGERY  1970's   COLONOSCOPY     CYSTOURETHROSCOPY  2018   [place] J ureteal stents   DECOMPRESSION CORE HIP Left 2014   FACIAL COSMETIC SURGERY  2004   FLUOROSCOPY GUIDANCE     HIP ARTHROPLASTY Left    INCISIONAL HERNIA REPAIR N/A 10/08/2019  Procedure: OPEN INCISIONAL HERNIA REPAIR WITH MESH;  Surgeon: Armandina Gemma, MD;  Location: [facility];  Service: General;  Laterality: N/A;   JOINT REPLACEMENT Left    Hip; 05/2014   KYPHOPLASTY N/A 08/31/2021   Procedure: KYPHOPLASTY THORACIC ELEVEN, THORACIC TWELVE;  Surgeon: Karsten Ro, DO;  Location: [facility];  Service: Neurosurgery;  Laterality: N/A;   LAPAROSCOPIC PARTIAL COLECTOMY  06/26/2017   LUMBAR PERCUTANEOUS PEDICLE SCREW 4 LEVEL N/A 12/29/2018   Procedure: Lumbar  Percutaneous Pedicle Screw Placement Lumbar two-Sacral one;  Surgeon: Erline Levine, MD;  Location: [facility];  Service: Neurosurgery;  Laterality: N/A;   MOHS SURGERY  2019   nose   PARTIAL COLECTOMY  06/2017   for diverticulitis   REMOVAL OF BILATERAL TISSUE EXPANDERS WITH PLACEMENT OF BILATERAL BREAST IMPLANTS  2004   THIGH LIFT  1994   TOE FUSION Right    great toe   TONSILLECTOMY     UMBILICAL HERNIA REPAIR     with tummy tuck   UMBILICAL HERNIA REPAIR  2002    SOCIAL HISTORY:  Social History   Socioeconomic History   Marital status: Married    Spouse name: Not on file   Number of children: Not on file   Years of education: Not on file   Highest education level: Not on file  Occupational History   Not on file  Tobacco Use   Smoking status: Never   Smokeless tobacco: Never  Vaping Use   Vaping Use: Never used  Substance and Sexual Activity   Alcohol use: Yes    Comment: occasional   Drug use: Never   Sexual activity: Not on file  Other Topics Concern   Not on file  Social History Narrative   Not on file   Social Determinants of Health   Financial Resource Strain: Low Risk  (07/24/2021)   Overall Financial Resource Strain (CARDIA)    Difficulty of Paying Living Expenses: Not hard at all  Food Insecurity: No Food Insecurity (07/24/2021)   Hunger Vital Sign    Worried About Running Out of Food in the Last Year: Never true    [place] in the Last Year: Never true  Transportation Needs: No Transportation Needs (07/24/2021)   PRAPARE - Hydrologist (Medical): No    Lack of Transportation (Non-Medical): No  Physical Activity: Not on file  Stress: Not on file  Social Connections: Socially Integrated (07/24/2021)   Social Connection and Isolation Panel [NHANES]    Frequency of Communication with Friends and Family: More than three times a week    Frequency of Social Gatherings with Friends and Family: More than three times a week     Attends Religious Services: 1 to 4 times per year    Active Member of [employer] or Organizations: No    Attends Music therapist: 1 to 4 times per year    Marital Status: Married  Human resources officer Violence: Not on file    FAMILY HISTORY:  Family History  Problem Relation Age of Onset   Hypertension Mother    COPD Mother    Hypertension Father    Heart disease Father     CURRENT MEDICATIONS:  Current Outpatient Medications  Medication Sig Dispense Refill   albuterol (VENTOLIN HFA) 108 (90 Base) MCG/ACT inhaler Inhale 2 puffs into the lungs every 6 (six) hours as needed for wheezing or shortness of breath.     aspirin EC 81 MG tablet Take  81 mg by mouth daily. Swallow whole.     Baclofen 5 MG TABS Take 1 tablet by mouth 3 (three) times daily as needed.     benzonatate (TESSALON) 100 MG capsule Take 100 mg by mouth 3 (three) times daily as needed for cough.     Biotin 1000 MCG tablet Take 1,000 mcg by mouth daily.     calcitonin, salmon, (MIACALCIN/FORTICAL) 200 UNIT/ACT nasal spray Place 1 spray into alternate nostrils daily.     Calcium Carb-Cholecalciferol 600-800 MG-UNIT TABS Take 1 tablet by mouth daily.     daratumumab-hyaluronidase-fihj (DARZALEX FASPRO) 1800-30000 MG-UT/15ML SOLN Inject 1,800 mg into the skin once a week.     dexamethasone (DECADRON) 2 MG tablet Take 5 tablets (10 mg total) by mouth once a week. 20 tablet 4   dextromethorphan-guaiFENesin (MUCINEX DM) 30-600 MG 12hr tablet Take 1 tablet by mouth daily.      estrogen, conjugated,-medroxyprogesterone (PREMPRO) 0.45-1.5 MG tablet Take 1 tablet by mouth daily.     feeding supplement (ENSURE ENLIVE / ENSURE PLUS) LIQD Take 237 mLs by mouth 2 (two) times daily between meals. (Patient taking differently: Take 237 mLs by mouth daily.) 237 mL 12   fluticasone (FLONASE) 50 MCG/ACT nasal spray Place 1 spray into both nostrils daily.     furosemide (LASIX) 20 MG tablet TAKE 1 TABLET BY MOUTH EVERY DAY AS NEEDED 90  tablet 3   hydrochlorothiazide (HYDRODIURIL) 25 MG tablet Take 25 mg by mouth daily as needed (swelling).     HYDROcodone bit-homatropine (HYCODAN) 5-1.5 MG/5ML syrup Take 5 mLs by mouth every 6 (six) hours as needed for cough. 473 mL 0   Lactulose 20 GM/30ML SOLN Take 30 ml by mouth every 3 hours until bowel movement is had, then take 30 ml daily (Patient taking differently: Take 20 g by mouth daily as needed (constipation).) 946 mL 3   latanoprost (XALATAN) 0.005 % ophthalmic solution Place 1 drop into both eyes at bedtime.      lenalidomide (REVLIMID) 20 MG capsule Take 1 capsule (20 mg total) by mouth daily. 14 days on, 7 days off 14 capsule 0   linaclotide (LINZESS) 145 MCG CAPS capsule Take 145 mcg by mouth daily as needed (constipation).     lisinopril (ZESTRIL) 20 MG tablet Take 20 mg by mouth daily.     loratadine (CLARITIN) 10 MG tablet Take 10 mg by mouth daily.     methocarbamol (ROBAXIN) 500 MG tablet Take 1 tablet (500 mg total) by mouth every 6 (six) hours as needed for muscle spasms. 40 tablet 3   omeprazole (PRILOSEC) 20 MG capsule Take 20 mg by mouth daily.     ondansetron (ZOFRAN ODT) 4 MG disintegrating tablet Take 1 tablet (4 mg total) by mouth every 4 (four) hours as needed for nausea or vomiting. 20 tablet 6   Oxycodone HCl 10 MG TABS Take 1 tablet (10 mg total) by mouth every 6 (six) hours as needed. 120 tablet 0   pantoprazole (PROTONIX) 40 MG tablet Take 40 mg by mouth daily.     sucralfate (CARAFATE) 1 g tablet Take 1 g by mouth 4 (four) times daily.     SUMAtriptan (IMITREX) 100 MG tablet Take 100 mg by mouth every 2 (two) hours as needed for migraine. May repeat in 2 hours if headache persists or recurs.     UNABLE TO FIND Place 0.5 g into both eyes 3 (three) times daily. Med Name: Jearld Pies (otepredol)     valACYclovir (  VALTREX) 500 MG tablet TAKE 1 TABLET BY MOUTH TWICE A DAY 180 tablet 2   No current facility-administered medications for this visit.    ALLERGIES:   Allergies  Allergen Reactions   Tobramycin-Dexamethasone Other (See Comments)    Itching and swelling   Ethambutol Other (See Comments)    Fever   Neomycin-Polymyxin-Dexameth Itching and Swelling   Amikacin Other (See Comments)    Eosinophilia with chills and aching when given with cefoxitin   Biaxin [Clarithromycin] Itching   Cefoxitin Other (See Comments)    Eosinophlia when given with amikacin   Sulfamethoxazole-Trimethoprim Itching   Vancomycin     "Got red splotches on skin after several doses"   Bacitracin-Polymyxin B Rash    PHYSICAL EXAM:  Performance status (ECOG): 1 - Symptomatic but completely ambulatory  Vitals:   02/21/22 1037  BP: 123/66  Pulse: 68  Resp: 16  Temp: 98.1 F (36.7 C)  SpO2: 100%   Wt Readings from Last 3 Encounters:  02/21/22 124 lb 6.4 oz (56.4 kg)  02/14/22 124 lb 12.8 oz (56.6 kg)  02/07/22 124 lb 6.4 oz (56.4 kg)   Physical Exam Vitals reviewed.  Constitutional:      Appearance: Normal appearance.     Comments: In wheelchair  Cardiovascular:     Rate and Rhythm: Normal rate and regular rhythm.     Pulses: Normal pulses.     Heart sounds: Normal heart sounds.  Pulmonary:     Effort: Pulmonary effort is normal.     Breath sounds: Normal breath sounds.  Neurological:     General: No focal deficit present.     Mental Status: She is alert and oriented to person, place, and time.  Psychiatric:        Mood and Affect: Mood normal.        Behavior: Behavior normal.     LABORATORY DATA:  I have reviewed the labs as listed.     Latest Ref Rng & Units 02/21/2022    9:35 AM 02/14/2022   10:20 AM 02/07/2022   10:39 AM  CBC  WBC 4.0 - 10.5 K/uL 6.0  5.6  6.3   Hemoglobin 12.0 - 15.0 g/dL 10.6  11.3  11.6   Hematocrit 36.0 - 46.0 % 32.7  35.0  36.5   Platelets 150 - 400 K/uL 247  311  248       Latest Ref Rng & Units 02/21/2022    9:35 AM 02/14/2022   10:20 AM 02/07/2022   10:39 AM  CMP  Glucose 70 - 99 mg/dL 106  85  97   BUN 8  - 23 mg/dL _0 Creatinine 0.44 - 1.00 mg/dL 0.76  0.79  0.76   Sodium 135 - 145 mmol/L 136  137  137   Potassium 3.5 - 5.1 mmol/L 4.3  4.5  4.5   Chloride 98 - 111 mmol/L 104  102  100   CO2 22 - 32 mmol/L _1 Calcium 8.9 - 10.3 mg/dL 8.5  9.1  9.4   Total Protein 6.5 - 8.1 g/dL 6.7  7.1  7.0   Total Bilirubin 0.3 - 1.2 mg/dL 0.6  0.7  0.9   Alkaline Phos 38 - 126 U/L 62  71  62   AST 15 - 41 U/L _2 ALT 0 - 44 U/L _3 DIAGNOSTIC IMAGING:  I have independently reviewed the scans and discussed with the patient. No results found.   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 Wilburn Mylar - She reported discomfort in the left posterior back for the last 1 month.  She was seen by Dr. Chauncey Reading and x-rays showed abnormality.  A CT scan of the chest was done on 05/24/2021. - CT chest report reviewed by me from 05/24/2021 showed expansile oval-shaped lesion within the posterior aspect of the seventh rib with a nodule measuring 2.1 x 1.1 cm.  Adjacent cortex demonstrates area of thinning and destruction.  Stable emphysematous and interstitial changes within the lungs.  Stable pulmonary nodules within the right upper lobe and left upper lobe when compared to CT from September 2020. - CT of the abdomen and pelvis did not show any significant abnormalities. - She denies any fevers, night sweats or weight loss in the last 6 months.  She had history of basal cell carcinoma on the nose removed by Mohs surgery. - She also reported history of osteoporosis and broke left upper rib 1 year ago after sneezing. -Reviewed PET scan from 06/14/2021.  Mild hypermetabolism involving left aspect of the L5 vertebral body and also pedicle region surrounding the screw.  SUV 4.2.  There appears to be a lytic process on the CT scan.  The lytic left posterior seventh rib lesion is mildly hypermetabolic with SUV 4.97.  No other  definite lytic hypermetabolic bone lesions seen. - We have reviewed left seventh rib bone biopsy from 06/25/2021 consistent with plasma cell neoplasm.  Cells are positive for CD138, CD56 and a kappa restricted.  - Labs on 06/13/2021 M spike 1.7 g.  Immunofixation IgA kappa.  Free light chain shows kappa light chain 71, ratio 8.22.  LDH and beta-2 microglobulin was normal. - Bone marrow biopsy on 07/04/2021 consistent with plasma cell neoplasm with 26% plasma cells on aspirate with atypical features and 50 to 60% on core biopsy.  Chromosome analysis was normal.  Multiple myeloma FISH panel shows loss of NF1/chromosome 17/17 q.  Gain of CCN D1 or chromosome 11/11 q.  Gain of chromosome 4/4P.  Loss of material from the long-arm of chromosome 13.  These are all standard risk features. - 24-hour urine total protein to 40 mg.  Urine immunofixation was negative.  Urine kappa light chains were 4.13. - Dara RVD started on 07/26/2021. - T11 and T12 vertebroplasty by Dr. Reatha Armour on 08/31/2021. - She has decided against bone marrow transplant after the advice of Dr. Lorenda Cahill.   Social/family history: - She is married and lives at home with her husband.  She has not worked but had help her husband and his work.  She is a non-smoker. - Maternal grandmother had colon cancer.  Mother had a squamous cell carcinoma of the skin.   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 - Reviewed myeloma panel from 02/14/2022.  M spike is 0.2 g and stable.  Previous DIRA testing was negative.  Immunofixation shows IgG kappa.  Free light chain ratio is 2.17, up from 1.8.  Kappa light chains are 15.4. - As the free light chain ratio has not normalized, I have recommended continuing same treatment for 1 more cycle to complete a total of 8 cycles. - We will check her myeloma panel in 3 weeks and see her on  04/19/2022.  We will plan to start her on Revlimid maintenance 10  mg 3 weeks on/1 week off at that time.  Will decide if we need to give monthly Darzalex as maintenance based on free light chain ratio at that time.  2.  Mid back pain (s/p vertebroplasty of T11 and T12 by Dr. Reatha Armour on 08/31/2021): - Continue to alternate between methocarbamol and Flexeril.  3.  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 [facility]da C[facility] at [facility] on 03/04/2022.  She is not on any active therapy at this time.  4.  Myeloma bone disease: - Continue Zometa every 4 weeks.  5.  ID prophylaxis: - Continue Valtrex 500 mg twice daily.   Orders placed this encounter:  No orders of the defined types were placed in this encounter.    Derek Jack, MD [place] [phone]

## 2022-02-21 NOTE — Progress Notes (Signed)
Patient presents today for Velcade injection per providers order.  Vital signs and labs within parameters for treatment.    Velcade administration without incident; injection site WNL; see MAR for injection details.  Patient tolerated procedure well and without incident.  No questions or complaints noted at this time.

## 2022-02-22 ENCOUNTER — Other Ambulatory Visit: Payer: Self-pay

## 2022-03-01 ENCOUNTER — Inpatient Hospital Stay: Payer: Medicare Other

## 2022-03-01 VITALS — BP 138/70 | HR 70 | Temp 97.9°F | Resp 18 | Wt 124.9 lb

## 2022-03-01 DIAGNOSIS — C9000 Multiple myeloma not having achieved remission: Secondary | ICD-10-CM

## 2022-03-01 DIAGNOSIS — Z5112 Encounter for antineoplastic immunotherapy: Secondary | ICD-10-CM | POA: Diagnosis not present

## 2022-03-01 LAB — CBC WITH DIFFERENTIAL/PLATELET
Abs Immature Granulocytes: 0.01 10*3/uL (ref 0.00–0.07)
Basophils Absolute: 0.1 10*3/uL (ref 0.0–0.1)
Basophils Relative: 2 %
Eosinophils Absolute: 0.3 10*3/uL (ref 0.0–0.5)
Eosinophils Relative: 5 %
HCT: 34.3 % — ABNORMAL LOW (ref 36.0–46.0)
Hemoglobin: 10.9 g/dL — ABNORMAL LOW (ref 12.0–15.0)
Immature Granulocytes: 0 %
Lymphocytes Relative: 41 %
Lymphs Abs: 2.2 10*3/uL (ref 0.7–4.0)
MCH: 28.5 pg (ref 26.0–34.0)
MCHC: 31.8 g/dL (ref 30.0–36.0)
MCV: 89.6 fL (ref 80.0–100.0)
Monocytes Absolute: 0.7 10*3/uL (ref 0.1–1.0)
Monocytes Relative: 13 %
Neutro Abs: 2.1 10*3/uL (ref 1.7–7.7)
Neutrophils Relative %: 39 %
Platelets: 213 10*3/uL (ref 150–400)
RBC: 3.83 MIL/uL — ABNORMAL LOW (ref 3.87–5.11)
RDW: 18.9 % — ABNORMAL HIGH (ref 11.5–15.5)
WBC: 5.4 10*3/uL (ref 4.0–10.5)
nRBC: 0 % (ref 0.0–0.2)

## 2022-03-01 LAB — COMPREHENSIVE METABOLIC PANEL
ALT: 20 U/L (ref 0–44)
AST: 24 U/L (ref 15–41)
Albumin: 3.9 g/dL (ref 3.5–5.0)
Alkaline Phosphatase: 59 U/L (ref 38–126)
Anion gap: 7 (ref 5–15)
BUN: 10 mg/dL (ref 8–23)
CO2: 27 mmol/L (ref 22–32)
Calcium: 8.8 mg/dL — ABNORMAL LOW (ref 8.9–10.3)
Chloride: 102 mmol/L (ref 98–111)
Creatinine, Ser: 0.73 mg/dL (ref 0.44–1.00)
GFR, Estimated: >60 mL/min (ref >60)
Glucose, Bld: 97 mg/dL (ref 70–99)
Potassium: 4.4 mmol/L (ref 3.5–5.1)
Sodium: 136 mmol/L (ref 135–145)
Total Bilirubin: 0.4 mg/dL (ref 0.3–1.2)
Total Protein: 6.8 g/dL (ref 6.5–8.1)

## 2022-03-01 LAB — MAGNESIUM: Magnesium: 1.9 mg/dL (ref 1.7–2.4)

## 2022-03-01 MED ORDER — DEXAMETHASONE 4 MG PO TABS
20.0000 mg | ORAL_TABLET | Freq: Once | ORAL | Status: AC
  Administered 2022-03-01: 20 mg via ORAL
  Filled 2022-03-01: qty 5

## 2022-03-01 MED ORDER — BORTEZOMIB CHEMO SQ INJECTION 3.5 MG (2.5MG/ML)
1.3000 mg/m2 | Freq: Once | INTRAMUSCULAR | Status: AC
  Administered 2022-03-01: 2 mg via SUBCUTANEOUS
  Filled 2022-03-01: qty 0.8

## 2022-03-01 MED ORDER — ACETAMINOPHEN 325 MG PO TABS
650.0000 mg | ORAL_TABLET | Freq: Once | ORAL | Status: AC
  Administered 2022-03-01: 650 mg via ORAL
  Filled 2022-03-01: qty 2

## 2022-03-01 MED ORDER — DIPHENHYDRAMINE HCL 25 MG PO CAPS
50.0000 mg | ORAL_CAPSULE | Freq: Once | ORAL | Status: AC
  Administered 2022-03-01: 50 mg via ORAL
  Filled 2022-03-01: qty 2

## 2022-03-01 MED ORDER — DARATUMUMAB-HYALURONIDASE-FIHJ 1800-30000 MG-UT/15ML ~~LOC~~ SOLN
1800.0000 mg | Freq: Once | SUBCUTANEOUS | Status: AC
  Administered 2022-03-01: 1800 mg via SUBCUTANEOUS
  Filled 2022-03-01: qty 15

## 2022-03-01 NOTE — Patient Instructions (Signed)
[facility]  Discharge Instructions: Thank you for choosing [facility] to provide your oncology and hematology care.  If you have a lab appointment with the [facility], please come in thru the Main Entrance and check in at the main information desk.  Wear comfortable clothing and clothing appropriate for easy access to any Portacath or PICC line.   We strive to give you quality time with your provider. You may need to reschedule your appointment if you arrive late (15 or more minutes).  Arriving late affects you and other patients whose appointments are after yours.  Also, if you miss three or more appointments without notifying the office, you may be dismissed from the clinic at the provider's discretion.      For prescription refill requests, have your pharmacy contact our office and allow 72 hours for refills to be completed.    Today you received the following chemotherapy and/or immunotherapy agents velcade. And dara.       To help prevent nausea and vomiting after your treatment, we encourage you to take your nausea medication as directed.  BELOW ARE SYMPTOMS THAT SHOULD BE REPORTED IMMEDIATELY: *FEVER GREATER THAN 100.4 F (38 C) OR HIGHER *CHILLS OR SWEATING *NAUSEA AND VOMITING THAT IS NOT CONTROLLED WITH YOUR NAUSEA MEDICATION *UNUSUAL SHORTNESS OF BREATH *UNUSUAL BRUISING OR BLEEDING *URINARY PROBLEMS (pain or burning when urinating, or frequent urination) *BOWEL PROBLEMS (unusual diarrhea, constipation, pain near the anus) TENDERNESS IN MOUTH AND THROAT WITH OR WITHOUT PRESENCE OF ULCERS (sore throat, sores in mouth, or a toothache) UNUSUAL RASH, SWELLING OR PAIN  UNUSUAL VAGINAL DISCHARGE OR ITCHING   Items with * indicate a potential emergency and should be followed up as soon as possible or go to the Emergency Department if any problems should occur.  Please show the CHEMOTHERAPY ALERT CARD or IMMUNOTHERAPY ALERT CARD at check-in to  the Emergency Department and triage nurse.  Should you have questions after your visit or need to cancel or reschedule your appointment, please contact [facility] [phone]  and follow the prompts.  Office hours are 8:00 a.m. to 4:30 p.m. Monday - Friday. Please note that voicemails left after 4:00 p.m. may not be returned until the following business day.  We are closed weekends and major holidays. You have access to a nurse at all times for urgent questions. Please call the main number to the clinic [phone] and follow the prompts.  For any non-urgent questions, you may also contact your provider using MyChart. We now offer e-Visits for anyone 67 and older to request care online for non-urgent symptoms. For details visit [email or web address].   Also download the MyChart app! Go to the app store, search "MyChart", open the app, select [facility], and log in with your MyChart username and password.  Masks are optional in the cancer centers. If you would like for your care team to wear a mask while they are taking care of you, please let them know. You may have one support person who is at least 72 years old accompany you for your appointments.

## 2022-03-01 NOTE — Progress Notes (Signed)
Patient tolerated Daratumumab injection with no complaints voiced.  See MAR for details.  Labs reviewed. Injection site clean and dry with no bruising or swelling noted at site.  Band aid applied.  Vss with discharge and left in satisfactory condition with no s/s of distress noted.    Patient tolerated Velcade injection with no complaints voiced.  Lab work reviewed.  See MAR for details.  Injection site clean and dry with no bruising or swelling noted.  Patient stable during and after injection.  Band aid applied.  VSS.  Patient left in satisfactory condition with no s/s of distress noted.

## 2022-03-08 ENCOUNTER — Inpatient Hospital Stay: Payer: Medicare Other

## 2022-03-08 VITALS — BP 123/72 | HR 72 | Temp 97.8°F | Resp 18 | Wt 123.6 lb

## 2022-03-08 DIAGNOSIS — C9000 Multiple myeloma not having achieved remission: Secondary | ICD-10-CM

## 2022-03-08 DIAGNOSIS — Z5112 Encounter for antineoplastic immunotherapy: Secondary | ICD-10-CM | POA: Diagnosis not present

## 2022-03-08 LAB — CBC WITH DIFFERENTIAL/PLATELET
Abs Immature Granulocytes: 0.02 10*3/uL (ref 0.00–0.07)
Basophils Absolute: 0 10*3/uL (ref 0.0–0.1)
Basophils Relative: 1 %
Eosinophils Absolute: 0.6 10*3/uL — ABNORMAL HIGH (ref 0.0–0.5)
Eosinophils Relative: 10 %
HCT: 34.2 % — ABNORMAL LOW (ref 36.0–46.0)
Hemoglobin: 10.9 g/dL — ABNORMAL LOW (ref 12.0–15.0)
Immature Granulocytes: 0 %
Lymphocytes Relative: 29 %
Lymphs Abs: 1.8 10*3/uL (ref 0.7–4.0)
MCH: 28.2 pg (ref 26.0–34.0)
MCHC: 31.9 g/dL (ref 30.0–36.0)
MCV: 88.4 fL (ref 80.0–100.0)
Monocytes Absolute: 0.3 10*3/uL (ref 0.1–1.0)
Monocytes Relative: 5 %
Neutro Abs: 3.4 10*3/uL (ref 1.7–7.7)
Neutrophils Relative %: 55 %
Platelets: 288 10*3/uL (ref 150–400)
RBC: 3.87 MIL/uL (ref 3.87–5.11)
RDW: 19.1 % — ABNORMAL HIGH (ref 11.5–15.5)
WBC: 6.2 10*3/uL (ref 4.0–10.5)
nRBC: 0 % (ref 0.0–0.2)

## 2022-03-08 LAB — COMPREHENSIVE METABOLIC PANEL
ALT: 21 U/L (ref 0–44)
AST: 21 U/L (ref 15–41)
Albumin: 4 g/dL (ref 3.5–5.0)
Alkaline Phosphatase: 60 U/L (ref 38–126)
Anion gap: 7 (ref 5–15)
BUN: 12 mg/dL (ref 8–23)
CO2: 27 mmol/L (ref 22–32)
Calcium: 8.9 mg/dL (ref 8.9–10.3)
Chloride: 101 mmol/L (ref 98–111)
Creatinine, Ser: 0.72 mg/dL (ref 0.44–1.00)
GFR, Estimated: >60 mL/min (ref >60)
Glucose, Bld: 100 mg/dL — ABNORMAL HIGH (ref 70–99)
Potassium: 4.4 mmol/L (ref 3.5–5.1)
Sodium: 135 mmol/L (ref 135–145)
Total Bilirubin: 0.7 mg/dL (ref 0.3–1.2)
Total Protein: 6.6 g/dL (ref 6.5–8.1)

## 2022-03-08 LAB — MAGNESIUM: Magnesium: 1.9 mg/dL (ref 1.7–2.4)

## 2022-03-08 MED ORDER — ZOLEDRONIC ACID 4 MG/100ML IV SOLN
4.0000 mg | Freq: Once | INTRAVENOUS | Status: AC
  Administered 2022-03-08: 4 mg via INTRAVENOUS
  Filled 2022-03-08: qty 100

## 2022-03-08 MED ORDER — SODIUM CHLORIDE 0.9 % IV SOLN: Freq: Once | INTRAVENOUS | Status: AC

## 2022-03-08 MED ORDER — PROCHLORPERAZINE MALEATE 10 MG PO TABS
10.0000 mg | ORAL_TABLET | Freq: Once | ORAL | Status: AC
  Administered 2022-03-08: 10 mg via ORAL
  Filled 2022-03-08: qty 1

## 2022-03-08 MED ORDER — BORTEZOMIB CHEMO SQ INJECTION 3.5 MG (2.5MG/ML)
1.3000 mg/m2 | Freq: Once | INTRAMUSCULAR | Status: AC
  Administered 2022-03-08: 2 mg via SUBCUTANEOUS
  Filled 2022-03-08: qty 0.8

## 2022-03-08 NOTE — Progress Notes (Signed)
Pt presents today for Velcade and Zometa IV per provider's order. Vitl signs and labs WNL for treatment today. Okay to proceed with treatment.   Velcade and Zometa IV given today per MD orders. Tolerated infusion without adverse affects. Vital signs stable. No complaints at this time. Discharged from clinic via wheelchair in stable condition. Alert and oriented x 3. F/U with [facility] as scheduled.

## 2022-03-08 NOTE — Patient Instructions (Signed)
[facility]  Discharge Instructions: Thank you for choosing [facility] to provide your oncology and hematology care.  If you have a lab appointment with the [facility], please come in thru the Main Entrance and check in at the main information desk.  Wear comfortable clothing and clothing appropriate for easy access to any Portacath or PICC line.   We strive to give you quality time with your provider. You may need to reschedule your appointment if you arrive late (15 or more minutes).  Arriving late affects you and other patients whose appointments are after yours.  Also, if you miss three or more appointments without notifying the office, you may be dismissed from the clinic at the provider's discretion.      For prescription refill requests, have your pharmacy contact our office and allow 72 hours for refills to be completed.    Today you received the following chemotherapy and/or immunotherapy agents Velcade and Zometa IV   To help prevent nausea and vomiting after your treatment, we encourage you to take your nausea medication as directed.  BELOW ARE SYMPTOMS THAT SHOULD BE REPORTED IMMEDIATELY: *FEVER GREATER THAN 100.4 F (38 C) OR HIGHER *CHILLS OR SWEATING *NAUSEA AND VOMITING THAT IS NOT CONTROLLED WITH YOUR NAUSEA MEDICATION *UNUSUAL SHORTNESS OF BREATH *UNUSUAL BRUISING OR BLEEDING *URINARY PROBLEMS (pain or burning when urinating, or frequent urination) *BOWEL PROBLEMS (unusual diarrhea, constipation, pain near the anus) TENDERNESS IN MOUTH AND THROAT WITH OR WITHOUT PRESENCE OF ULCERS (sore throat, sores in mouth, or a toothache) UNUSUAL RASH, SWELLING OR PAIN  UNUSUAL VAGINAL DISCHARGE OR ITCHING   Items with * indicate a potential emergency and should be followed up as soon as possible or go to the Emergency Department if any problems should occur.  Please show the CHEMOTHERAPY ALERT CARD or IMMUNOTHERAPY ALERT CARD at check-in to  the Emergency Department and triage nurse.  Should you have questions after your visit or need to cancel or reschedule your appointment, please contact [facility] [phone]  and follow the prompts.  Office hours are 8:00 a.m. to 4:30 p.m. Monday - Friday. Please note that voicemails left after 4:00 p.m. may not be returned until the following business day.  We are closed weekends and major holidays. You have access to a nurse at all times for urgent questions. Please call the main number to the clinic [phone] and follow the prompts.  For any non-urgent questions, you may also contact your provider using MyChart. We now offer e-Visits for anyone 65 and older to request care online for non-urgent symptoms. For details visit [email or web address].   Also download the MyChart app! Go to the app store, search "MyChart", open the app, select [facility], and log in with your MyChart username and password.  Masks are optional in the cancer centers. If you would like for your care team to wear a mask while they are taking care of you, please let them know. You may have one support person who is at least 72 years old accompany you for your appointments.  Bortezomib Injection What is this medication? BORTEZOMIB (bor TEZ oh mib) treats lymphoma. It may also be used to treat multiple myeloma, a type of bone marrow cancer. It works by blocking a protein that causes cancer cells to grow and multiply. This helps to slow or stop the spread of cancer cells. This medicine may be used for other purposes; ask your health care provider or pharmacist  if you have questions. COMMON BRAND NAME(S): Velcade What should I tell my care team before I take this medication? They need to know if you have any of these conditions: Dehydration Diabetes Heart disease Liver disease Tingling of the fingers or toes or other nerve disorder An unusual or allergic reaction to bortezomib, other  medications, foods, dyes, or preservatives If you or your partner are pregnant or trying to get pregnant Breastfeeding How should I use this medication? This medication is injected into a vein or under the skin. It is given by your care team in a hospital or clinic setting. Talk to your care team about the use of this medication in children. Special care may be needed. Overdosage: If you think you have taken too much of this medicine contact a poison control center or emergency room at once. NOTE: This medicine is only for you. Do not share this medicine with others. What if I miss a dose? Keep appointments for follow-up doses. It is important not to miss your dose. Call your care team if you are unable to keep an appointment. What may interact with this medication? Ketoconazole Rifampin This list may not describe all possible interactions. Give your health care provider a list of all the medicines, herbs, non-prescription drugs, or dietary supplements you use. Also tell them if you smoke, drink alcohol, or use illegal drugs. Some items may interact with your medicine. What should I watch for while using this medication? Your condition will be monitored carefully while you are receiving this medication. You may need blood work while taking this medication. This medication may affect your coordination, reaction time, or judgment. Do not drive or operate machinery until you know how this medication affects you. Sit up or stand slowly to reduce the risk of dizzy or fainting spells. Drinking alcohol with this medication can increase the risk of these side effects. This medication may increase your risk of getting an infection. Call your care team for advice if you get a fever, chills, sore throat, or other symptoms of a cold or flu. Do not treat yourself. Try to avoid being around people who are sick. Check with your care team if you have severe diarrhea, nausea, and vomiting, or if you sweat a lot.  The loss of too much body fluid may make it dangerous for you to take this medication. Talk to your care team if you may be pregnant. Serious birth defects can occur if you take this medication during pregnancy and for 7 months after the last dose. You will need a negative pregnancy test before starting this medication. Contraception is recommended while taking this medication and for 7 months after the last dose. Your care team can help you find the option that works for you. If your partner can get pregnant, use a condom during sex while taking this medication and for 4 months after the last dose. Do not breastfeed while taking this medication and for 2 months after the last dose. This medication may cause infertility. Talk to your care team if you are concerned about your fertility. What side effects may I notice from receiving this medication? Side effects that you should report to your care team as soon as possible: Allergic reactions--skin rash, itching, hives, swelling of the face, lips, tongue, or throat Bleeding--bloody or black, tar-like stools, vomiting blood or brown material that looks like coffee grounds, red or dark brown urine, small red or purple spots on skin, unusual bruising or bleeding Bleeding in  the brain--severe headache, stiff neck, confusion, dizziness, change in vision, numbness or weakness of the face, arm, or leg, trouble speaking, trouble walking, vomiting Bowel blockage--stomach cramping, unable to have a bowel movement or pass gas, loss of appetite, vomiting Heart failure--shortness of breath, swelling of the ankles, feet, or hands, sudden weight gain, unusual weakness or fatigue Infection--fever, chills, cough, sore throat, wounds that don't heal, pain or trouble when passing urine, general feeling of discomfort or being unwell Liver injury--right upper belly pain, loss of appetite, nausea, light-colored stool, dark yellow or brown urine, yellowing skin or eyes, unusual  weakness or fatigue Low blood pressure--dizziness, feeling faint or lightheaded, blurry vision Lung injury--shortness of breath or trouble breathing, cough, spitting up blood, chest pain, fever Pain, tingling, or numbness in the hands or feet Severe or prolonged diarrhea Stomach pain, bloody diarrhea, pale skin, unusual weakness or fatigue, decrease in the amount of urine, which may be signs of hemolytic uremic syndrome Sudden and severe headache, confusion, change in vision, seizures, which may be signs of posterior reversible encephalopathy syndrome (PRES) TTP--purple spots on the skin or inside the mouth, pale skin, yellowing skin or eyes, unusual weakness or fatigue, fever, fast or irregular heartbeat, confusion, change in vision, trouble speaking, trouble walking Tumor lysis syndrome (TLS)--nausea, vomiting, diarrhea, decrease in the amount of urine, dark urine, unusual weakness or fatigue, confusion, muscle pain or cramps, fast or irregular heartbeat, joint pain Side effects that usually do not require medical attention (report to your care team if they continue or are bothersome): Constipation Diarrhea Fatigue Loss of appetite Nausea This list may not describe all possible side effects. Call your doctor for medical advice about side effects. You may report side effects to FDA at 1-800-FDA-1088. Where should I keep my medication? This medication is given in a hospital or clinic. It will not be stored at home. NOTE: This sheet is a summary. It may not cover all possible information. If you have questions about this medicine, talk to your doctor, pharmacist, or health care provider.  2023 Elsevier/Gold Standard (2021-11-28 00:00:00)

## 2022-03-11 ENCOUNTER — Other Ambulatory Visit: Payer: Self-pay

## 2022-03-13 ENCOUNTER — Other Ambulatory Visit: Payer: Self-pay

## 2022-03-13 MED ORDER — LENALIDOMIDE 10 MG PO CAPS: 10.0000 mg | ORAL_CAPSULE | Freq: Every day | ORAL | 0 refills | Status: DC

## 2022-03-13 NOTE — Telephone Encounter (Signed)
Chart reviewed. Revlimid refilled per last office note with Dr. Katragadda.  

## 2022-03-15 ENCOUNTER — Inpatient Hospital Stay: Payer: Medicare Other | Attending: Hematology

## 2022-03-15 ENCOUNTER — Inpatient Hospital Stay: Payer: Medicare Other

## 2022-03-15 VITALS — BP 121/64 | HR 65 | Temp 97.6°F | Resp 18 | Wt 126.6 lb

## 2022-03-15 DIAGNOSIS — C9000 Multiple myeloma not having achieved remission: Secondary | ICD-10-CM | POA: Diagnosis present

## 2022-03-15 DIAGNOSIS — Z800 Family history of malignant neoplasm of digestive organs: Secondary | ICD-10-CM | POA: Diagnosis not present

## 2022-03-15 DIAGNOSIS — Z9221 Personal history of antineoplastic chemotherapy: Secondary | ICD-10-CM | POA: Insufficient documentation

## 2022-03-15 DIAGNOSIS — Z5112 Encounter for antineoplastic immunotherapy: Secondary | ICD-10-CM | POA: Insufficient documentation

## 2022-03-15 DIAGNOSIS — Z808 Family history of malignant neoplasm of other organs or systems: Secondary | ICD-10-CM | POA: Diagnosis not present

## 2022-03-15 LAB — COMPREHENSIVE METABOLIC PANEL
ALT: 21 U/L (ref 0–44)
AST: 21 U/L (ref 15–41)
Albumin: 4 g/dL (ref 3.5–5.0)
Alkaline Phosphatase: 61 U/L (ref 38–126)
Anion gap: 7 (ref 5–15)
BUN: 13 mg/dL (ref 8–23)
CO2: 27 mmol/L (ref 22–32)
Calcium: 8.7 mg/dL — ABNORMAL LOW (ref 8.9–10.3)
Chloride: 102 mmol/L (ref 98–111)
Creatinine, Ser: 0.76 mg/dL (ref 0.44–1.00)
GFR, Estimated: >60 mL/min (ref >60)
Glucose, Bld: 98 mg/dL (ref 70–99)
Potassium: 4.2 mmol/L (ref 3.5–5.1)
Sodium: 136 mmol/L (ref 135–145)
Total Bilirubin: 0.5 mg/dL (ref 0.3–1.2)
Total Protein: 6.6 g/dL (ref 6.5–8.1)

## 2022-03-15 LAB — CBC WITH DIFFERENTIAL/PLATELET
Abs Immature Granulocytes: 0.03 10*3/uL (ref 0.00–0.07)
Basophils Absolute: 0.1 10*3/uL (ref 0.0–0.1)
Basophils Relative: 1 %
Eosinophils Absolute: 0.6 10*3/uL — ABNORMAL HIGH (ref 0.0–0.5)
Eosinophils Relative: 8 %
HCT: 35.3 % — ABNORMAL LOW (ref 36.0–46.0)
Hemoglobin: 11.4 g/dL — ABNORMAL LOW (ref 12.0–15.0)
Immature Granulocytes: 0 %
Lymphocytes Relative: 19 %
Lymphs Abs: 1.4 10*3/uL (ref 0.7–4.0)
MCH: 28.6 pg (ref 26.0–34.0)
MCHC: 32.3 g/dL (ref 30.0–36.0)
MCV: 88.7 fL (ref 80.0–100.0)
Monocytes Absolute: 0.6 10*3/uL (ref 0.1–1.0)
Monocytes Relative: 9 %
Neutro Abs: 4.4 10*3/uL (ref 1.7–7.7)
Neutrophils Relative %: 63 %
Platelets: 233 10*3/uL (ref 150–400)
RBC: 3.98 MIL/uL (ref 3.87–5.11)
RDW: 18.9 % — ABNORMAL HIGH (ref 11.5–15.5)
WBC: 7 10*3/uL (ref 4.0–10.5)
nRBC: 0 % (ref 0.0–0.2)

## 2022-03-15 LAB — MAGNESIUM: Magnesium: 1.9 mg/dL (ref 1.7–2.4)

## 2022-03-15 LAB — LACTATE DEHYDROGENASE: LDH: 140 U/L (ref 98–192)

## 2022-03-15 MED ORDER — DARATUMUMAB-HYALURONIDASE-FIHJ 1800-30000 MG-UT/15ML ~~LOC~~ SOLN
1800.0000 mg | Freq: Once | SUBCUTANEOUS | Status: AC
  Administered 2022-03-15: 1800 mg via SUBCUTANEOUS
  Filled 2022-03-15: qty 15

## 2022-03-15 MED ORDER — BORTEZOMIB CHEMO SQ INJECTION 3.5 MG (2.5MG/ML)
1.3000 mg/m2 | Freq: Once | INTRAMUSCULAR | Status: AC
  Administered 2022-03-15: 2 mg via SUBCUTANEOUS
  Filled 2022-03-15: qty 0.8

## 2022-03-15 NOTE — Patient Instructions (Signed)
[facility]  Discharge Instructions: Thank you for choosing [facility] to provide your oncology and hematology care.  If you have a lab appointment with the Cancer Center, please come in thru the Main Entrance and check in at the main information desk.  Wear comfortable clothing and clothing appropriate for easy access to any Portacath or PICC line.   We strive to give you quality time with your provider. You may need to reschedule your appointment if you arrive late (15 or more minutes).  Arriving late affects you and other patients whose appointments are after yours.  Also, if you miss three or more appointments without notifying the office, you may be dismissed from the clinic at the provider's discretion.      For prescription refill requests, have your pharmacy contact our office and allow 72 hours for refills to be completed.     To help prevent nausea and vomiting after your treatment, we encourage you to take your nausea medication as directed.  BELOW ARE SYMPTOMS THAT SHOULD BE REPORTED IMMEDIATELY: *FEVER GREATER THAN 100.4 F (38 C) OR HIGHER *CHILLS OR SWEATING *NAUSEA AND VOMITING THAT IS NOT CONTROLLED WITH YOUR NAUSEA MEDICATION *UNUSUAL SHORTNESS OF BREATH *UNUSUAL BRUISING OR BLEEDING *URINARY PROBLEMS (pain or burning when urinating, or frequent urination) *BOWEL PROBLEMS (unusual diarrhea, constipation, pain near the anus) TENDERNESS IN MOUTH AND THROAT WITH OR WITHOUT PRESENCE OF ULCERS (sore throat, sores in mouth, or a toothache) UNUSUAL RASH, SWELLING OR PAIN  UNUSUAL VAGINAL DISCHARGE OR ITCHING   Items with * indicate a potential emergency and should be followed up as soon as possible or go to the Emergency Department if any problems should occur.  Please show the CHEMOTHERAPY ALERT CARD or IMMUNOTHERAPY ALERT CARD at check-in to the Emergency Department and triage nurse.  Should you have questions after your visit or need to  cancel or reschedule your appointment, please contact [facility] [phone]  and follow the prompts.  Office hours are 8:00 a.m. to 4:30 p.m. Monday - Friday. Please note that voicemails left after 4:00 p.m. may not be returned until the following business day.  We are closed weekends and major holidays. You have access to a nurse at all times for urgent questions. Please call the main number to the clinic [phone] and follow the prompts.  For any non-urgent questions, you may also contact your provider using MyChart. We now offer e-Visits for anyone 18 and older to request care online for non-urgent symptoms. For details visit [email or web address].   Also download the MyChart app! Go to the app store, search "MyChart", open the app, select [facility], and log in with your MyChart username and password.  Masks are optional in the cancer centers. If you would like for your care team to wear a mask while they are taking care of you, please let them know. You may have one support person who is at least 72 years old accompany you for your appointments.  

## 2022-03-15 NOTE — Progress Notes (Signed)
Patient presents today for Velcade and Dartumumab injections.  Patient is in satisfactory condition with no new complaints voiced.  Vital signs are stable.  Labs reviewed and all labs are within treatment parameters.  Pre-medications were taken at home prior to visit at 1130.  We will proceed with injections per MD orders.  Patient tolerated injections with no complaints voiced.  Site clean and dry with no bruising or swelling noted.  No complaints of pain.  Discharged with vital signs stable and no signs or symptoms of distress noted.

## 2022-03-17 ENCOUNTER — Other Ambulatory Visit: Payer: Self-pay | Admitting: Hematology

## 2022-03-19 LAB — KAPPA/LAMBDA LIGHT CHAINS
Kappa free light chain: 14.5 mg/L (ref 3.3–19.4)
Kappa, lambda light chain ratio: 1.88 — ABNORMAL HIGH (ref 0.26–1.65)
Lambda free light chains: 7.7 mg/L (ref 5.7–26.3)

## 2022-03-20 ENCOUNTER — Inpatient Hospital Stay (HOSPITAL_BASED_OUTPATIENT_CLINIC_OR_DEPARTMENT_OTHER): Payer: Medicare Other | Admitting: Hematology

## 2022-03-20 VITALS — BP 132/83 | HR 65 | Temp 98.0°F | Resp 18 | Ht 61.0 in | Wt 126.2 lb

## 2022-03-20 DIAGNOSIS — Z5112 Encounter for antineoplastic immunotherapy: Secondary | ICD-10-CM | POA: Diagnosis not present

## 2022-03-20 DIAGNOSIS — C9000 Multiple myeloma not having achieved remission: Secondary | ICD-10-CM | POA: Diagnosis not present

## 2022-03-20 LAB — PROTEIN ELECTROPHORESIS, SERUM
A/G Ratio: 1.6 (ref 0.7–1.7)
Albumin ELP: 3.8 g/dL (ref 2.9–4.4)
Alpha-1-Globulin: 0.3 g/dL (ref 0.0–0.4)
Alpha-2-Globulin: 0.7 g/dL (ref 0.4–1.0)
Beta Globulin: 0.9 g/dL (ref 0.7–1.3)
Gamma Globulin: 0.6 g/dL (ref 0.4–1.8)
Globulin, Total: 2.4 g/dL (ref 2.2–3.9)
M-Spike, %: 0.2 g/dL — ABNORMAL HIGH
Total Protein ELP: 6.2 g/dL (ref 6.0–8.5)

## 2022-03-20 MED ORDER — DEXAMETHASONE 2 MG PO TABS: 10.0000 mg | ORAL_TABLET | ORAL | 4 refills | Status: DC

## 2022-03-20 NOTE — Progress Notes (Signed)
[place], [place] [address]   CLINIC:  Medical Oncology/Hematology  PCP:  Yvone Neu, MD [facility] [place] [address] /* [phone]   REASON FOR VISIT:  Follow-up for multiple myeloma  PRIOR THERAPY: none  NGS Results: not done  CURRENT THERAPY: DaraVRd (Daratumumab SQ) q21d x 6 Cycles (Induction/Consolidation)  BRIEF ONCOLOGIC HISTORY:  Oncology History  Multiple myeloma ([place])  07/17/2021 Initial Diagnosis   Multiple myeloma ([place])   07/26/2021 - 03/15/2022 Chemotherapy   Patient is on Treatment Plan : MYELOMA NEWLY DIAGNOSED TRANSPLANT CANDIDATE DaraVRd (Daratumumab SQ) q21d x 6 Cycles (Induction/Consolidation)     04/15/2022 -  Chemotherapy   Patient is on Treatment Plan : MYELOMA NEWLY DIAGNOSED TRANSPLANT CANDIDATE Daratumumab SQ + Lenalidomide q28d (Maintenance)       CANCER STAGING:  Cancer Staging  Multiple myeloma ([place]) Staging form: Multiple Myeloma, AJCC 6th Edition - Clinical stage from 07/17/2021: Stage IA - Unsigned   INTERVAL HISTORY:  Ms. ASHANTY COLTRANE, a 71 y.o. female, seen for follow-up of multiple myeloma.  Chronic cough and shortness of breath from her lung infection has been stable.  She was recently seen by Dr. Lorenda Cahill at [facility].  No new recommendations.  Back pain has been stable.  REVIEW OF SYSTEMS:  Review of Systems  Respiratory:  Positive for cough (dry) and shortness of breath.   Gastrointestinal:  Positive for diarrhea and nausea. Negative for abdominal pain.  Musculoskeletal:  Positive for back pain (5/10).  All other systems reviewed and are negative.   PAST MEDICAL/SURGICAL HISTORY:  Past Medical History:  Diagnosis Date   Arthritis    osteoarthritis   Back pain    Bronchiectasis (HCC)    Cancer (HCC)    basal cell nose   Chronic cough    Complication of anesthesia    Dyspnea    Gastrointestinal symptoms    GERD (gastroesophageal  reflux disease)    Glaucoma    History of hiatal hernia    Hyperlipemia    Hypertension    Multiple myeloma (HCC)    Osteopenia    Pneumonia    PONV (postoperative nausea and vomiting)    "only one time"   Spondylisthesis    Past Surgical History:  Procedure Laterality Date   ABDOMINAL EXPOSURE N/A 12/29/2018   Procedure: ABDOMINAL EXPOSURE;  Surgeon: Angelia Mould, MD;  Location: [place];  Service: Vascular;  Laterality: N/A;   ABDOMINOPLASTY  2002   ABDOMINOPLASTY  1970's   ANKLE SURGERY Left 10/2015   ANTERIOR LATERAL LUMBAR FUSION WITH PERCUTANEOUS SCREW 3 LEVEL Left 12/29/2018   Procedure: Lumbar Two to Lumbar Five Anterolateral lumbar interbody fusion;  Surgeon: Erline Levine, MD;  Location: [place];  Service: Neurosurgery;  Laterality: Left;  anterolateral   ANTERIOR LUMBAR FUSION N/A 12/29/2018   Procedure: Lumbar Five Sacral One Anterior lumbar interbody fusion;  Surgeon: Erline Levine, MD;  Location: [place];  Service: Neurosurgery;  Laterality: N/A;  anterior approach   BASAL CELL CARCINOMA EXCISION  06/2018   nose   BLEPHAROPLASTY Bilateral 1999   BREAST ENHANCEMENT SURGERY  1970's   COLONOSCOPY     CYSTOURETHROSCOPY  2018   [place] ureteal stents   DECOMPRESSION CORE HIP Left 2014   FACIAL COSMETIC SURGERY  2004   FLUOROSCOPY GUIDANCE     HIP ARTHROPLASTY Left    INCISIONAL HERNIA REPAIR N/A 10/08/2019   Procedure: OPEN INCISIONAL HERNIA REPAIR WITH  MESH;  Surgeon: Armandina Gemma, MD;  Location: [facility];  Service: General;  Laterality: N/A;   JOINT REPLACEMENT Left    Hip; 05/2014   KYPHOPLASTY N/A 08/31/2021   Procedure: KYPHOPLASTY THORACIC ELEVEN, THORACIC TWELVE;  Surgeon: Karsten Ro, DO;  Location: [place];  Service: Neurosurgery;  Laterality: N/A;   LAPAROSCOPIC PARTIAL COLECTOMY  06/26/2017   LUMBAR PERCUTANEOUS PEDICLE SCREW 4 LEVEL N/A 12/29/2018   Procedure: Lumbar Percutaneous Pedicle Screw Placement Lumbar two-Sacral one;  Surgeon: Erline Levine,  MD;  Location: [place];  Service: Neurosurgery;  Laterality: N/A;   MOHS SURGERY  2019   nose   PARTIAL COLECTOMY  06/2017   for diverticulitis   REMOVAL OF BILATERAL TISSUE EXPANDERS WITH PLACEMENT OF BILATERAL BREAST IMPLANTS  2004   THIGH LIFT  1994   TOE FUSION Right    great toe   TONSILLECTOMY     UMBILICAL HERNIA REPAIR     with tummy tuck   UMBILICAL HERNIA REPAIR  2002    SOCIAL HISTORY:  Social History   Socioeconomic History   Marital status: Married    Spouse name: Not on file   Number of children: Not on file   Years of education: Not on file   Highest education level: Not on file  Occupational History   Not on file  Tobacco Use   Smoking status: Never   Smokeless tobacco: Never  Vaping Use   Vaping Use: Never used  Substance and Sexual Activity   Alcohol use: Yes    Comment: occasional   Drug use: Never   Sexual activity: Not on file  Other Topics Concern   Not on file  Social History Narrative   Not on file   Social Determinants of Health   Financial Resource Strain: Low Risk  (07/24/2021)   Overall Financial Resource Strain (CARDIA)    Difficulty of Paying Living Expenses: Not hard at all  Food Insecurity: No Food Insecurity (07/24/2021)   Hunger Vital Sign    Worried About Running Out of Food in the Last Year: Never true    [place] in the Last Year: Never true  Transportation Needs: No Transportation Needs (07/24/2021)   PRAPARE - Hydrologist (Medical): No    Lack of Transportation (Non-Medical): No  Physical Activity: Not on file  Stress: Not on file  Social Connections: Socially Integrated (07/24/2021)   Social Connection and Isolation Panel [NHANES]    Frequency of Communication with Friends and Family: More than three times a week    Frequency of Social Gatherings with Friends and Family: More than three times a week    Attends Religious Services: 1 to 4 times per year    Active Member of [employer] or  Organizations: No    Attends Music therapist: 1 to 4 times per year    Marital Status: Married  Human resources officer Violence: Not on file    FAMILY HISTORY:  Family History  Problem Relation Age of Onset   Hypertension Mother    COPD Mother    Hypertension Father    Heart disease Father     CURRENT MEDICATIONS:  Current Outpatient Medications  Medication Sig Dispense Refill   albuterol (VENTOLIN HFA) 108 (90 Base) MCG/ACT inhaler Inhale 2 puffs into the lungs every 6 (six) hours as needed for wheezing or shortness of breath.     aspirin EC 81 MG tablet Take 81 mg by mouth daily. Swallow  whole.     Baclofen 5 MG TABS Take 1 tablet by mouth 3 (three) times daily as needed.     benzonatate (TESSALON) 100 MG capsule Take 100 mg by mouth 3 (three) times daily as needed for cough.     Biotin 1000 MCG tablet Take 1,000 mcg by mouth daily.     calcitonin, salmon, (MIACALCIN/FORTICAL) 200 UNIT/ACT nasal spray Place 1 spray into alternate nostrils daily.     Calcium Carb-Cholecalciferol 600-800 MG-UNIT TABS Take 1 tablet by mouth daily.     daratumumab-hyaluronidase-fihj (DARZALEX FASPRO) 1800-30000 MG-UT/15ML SOLN Inject 1,800 mg into the skin once a week.     dextromethorphan-guaiFENesin (MUCINEX DM) 30-600 MG 12hr tablet Take 1 tablet by mouth daily.      estrogen, conjugated,-medroxyprogesterone (PREMPRO) 0.45-1.5 MG tablet Take 1 tablet by mouth daily.     feeding supplement (ENSURE ENLIVE / ENSURE PLUS) LIQD Take 237 mLs by mouth 2 (two) times daily between meals. (Patient taking differently: Take 237 mLs by mouth daily.) 237 mL 12   fluticasone (FLONASE) 50 MCG/ACT nasal spray Place 1 spray into both nostrils daily.     furosemide (LASIX) 20 MG tablet TAKE 1 TABLET BY MOUTH EVERY DAY AS NEEDED 90 tablet 3   hydrochlorothiazide (HYDRODIURIL) 25 MG tablet Take 25 mg by mouth daily as needed (swelling).     HYDROcodone bit-homatropine (HYCODAN) 5-1.5 MG/5ML syrup Take 5 mLs by  mouth every 6 (six) hours as needed for cough. 473 mL 0   Lactulose 20 GM/30ML SOLN Take 30 ml by mouth every 3 hours until bowel movement is had, then take 30 ml daily (Patient taking differently: Take 20 g by mouth daily as needed (constipation).) 946 mL 3   latanoprost (XALATAN) 0.005 % ophthalmic solution Place 1 drop into both eyes at bedtime.      lenalidomide (REVLIMID) 10 MG capsule Take 1 capsule (10 mg total) by mouth daily. 21 days on, 7 days off 21 capsule 0   linaclotide (LINZESS) 145 MCG CAPS capsule Take 145 mcg by mouth daily as needed (constipation).     lisinopril (ZESTRIL) 20 MG tablet Take 20 mg by mouth daily.     loratadine (CLARITIN) 10 MG tablet Take 10 mg by mouth daily.     methocarbamol (ROBAXIN) 500 MG tablet Take 1 tablet (500 mg total) by mouth every 6 (six) hours as needed for muscle spasms. 40 tablet 3   omeprazole (PRILOSEC) 20 MG capsule Take 20 mg by mouth daily.     ondansetron (ZOFRAN ODT) 4 MG disintegrating tablet Take 1 tablet (4 mg total) by mouth every 4 (four) hours as needed for nausea or vomiting. 20 tablet 6   Oxycodone HCl 10 MG TABS Take 1 tablet (10 mg total) by mouth every 6 (six) hours as needed. 120 tablet 0   pantoprazole (PROTONIX) 40 MG tablet Take 40 mg by mouth daily.     sucralfate (CARAFATE) 1 g tablet Take 1 g by mouth 4 (four) times daily.     SUMAtriptan (IMITREX) 100 MG tablet Take 100 mg by mouth every 2 (two) hours as needed for migraine. May repeat in 2 hours if headache persists or recurs.     UNABLE TO FIND Place 0.5 g into both eyes 3 (three) times daily. Med Name: Loteman (otepredol)     valACYclovir (VALTREX) 500 MG tablet TAKE 1 TABLET BY MOUTH TWICE A DAY 180 tablet 2   dexamethasone (DECADRON) 2 MG tablet Take 5 tablets (10 mg  total) by mouth every 30 (thirty) days. 20 tablet 4   No current facility-administered medications for this visit.    ALLERGIES:  Allergies  Allergen Reactions   Tobramycin-Dexamethasone Other  (See Comments)    Itching and swelling   Ethambutol Other (See Comments)    Fever   Neomycin-Polymyxin-Dexameth Itching and Swelling   Amikacin Other (See Comments)    Eosinophilia with chills and aching when given with cefoxitin   Biaxin [Clarithromycin] Itching   Cefoxitin Other (See Comments)    Eosinophlia when given with amikacin   Sulfamethoxazole-Trimethoprim Itching   Vancomycin     "Got red splotches on skin after several doses"   Bacitracin-Polymyxin B Rash    PHYSICAL EXAM:  Performance status (ECOG): 1 - Symptomatic but completely ambulatory  Vitals:   03/20/22 1137  BP: 132/83  Pulse: 65  Resp: 18  Temp: 98 F (36.7 C)  SpO2: 95%   Wt Readings from Last 3 Encounters:  03/20/22 126 lb 3.2 oz (57.2 kg)  03/15/22 126 lb 9.6 oz (57.4 kg)  03/08/22 123 lb 9.6 oz (56.1 kg)   Physical Exam Vitals reviewed.  Constitutional:      Appearance: Normal appearance.     Comments: In wheelchair  Cardiovascular:     Rate and Rhythm: Normal rate and regular rhythm.     Pulses: Normal pulses.     Heart sounds: Normal heart sounds.  Pulmonary:     Effort: Pulmonary effort is normal.     Breath sounds: Normal breath sounds.  Neurological:     General: No focal deficit present.     Mental Status: She is alert and oriented to person, place, and time.  Psychiatric:        Mood and Affect: Mood normal.        Behavior: Behavior normal.     LABORATORY DATA:  I have reviewed the labs as listed.     Latest Ref Rng & Units 03/15/2022   12:11 PM 03/08/2022   12:06 PM 03/01/2022   12:11 PM  CBC  WBC 4.0 - 10.5 K/uL 7.0  6.2  5.4   Hemoglobin 12.0 - 15.0 g/dL 11.4  10.9  10.9   Hematocrit 36.0 - 46.0 % 35.3  34.2  34.3   Platelets 150 - 400 K/uL 233  288  213       Latest Ref Rng & Units 03/15/2022   12:11 PM 03/08/2022   12:06 PM 03/01/2022   12:11 PM  CMP  Glucose 70 - 99 mg/dL 98  100  97   BUN 8 - 23 mg/dL '13  12  10   ' Creatinine 0.44 - 1.00 mg/dL 0.76  0.72  0.73    Sodium 135 - 145 mmol/L 136  135  136   Potassium 3.5 - 5.1 mmol/L 4.2  4.4  4.4   Chloride 98 - 111 mmol/L 102  101  102   CO2 22 - 32 mmol/L '27  27  27   ' Calcium 8.9 - 10.3 mg/dL 8.7  8.9  8.8   Total Protein 6.5 - 8.1 g/dL 6.6  6.6  6.8   Total Bilirubin 0.3 - 1.2 mg/dL 0.5  0.7  0.4   Alkaline Phos 38 - 126 U/L 61  60  59   AST 15 - 41 U/L '21  21  24   ' ALT 0 - 44 U/L '21  21  20     ' DIAGNOSTIC IMAGING:  I have independently reviewed the scans  and discussed with the patient. No results found.   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 Wilburn Mylar - She reported discomfort in the left posterior back for the last 1 month.  She was seen by Dr. Chauncey Reading and x-rays showed abnormality.  A CT scan of the chest was done on 05/24/2021. - CT chest report reviewed by me from 05/24/2021 showed expansile oval-shaped lesion within the posterior aspect of the seventh rib with a nodule measuring 2.1 x 1.1 cm.  Adjacent cortex demonstrates area of thinning and destruction.  Stable emphysematous and interstitial changes within the lungs.  Stable pulmonary nodules within the right upper lobe and left upper lobe when compared to CT from September 2020. - CT of the abdomen and pelvis did not show any significant abnormalities. - She denies any fevers, night sweats or weight loss in the last 6 months.  She had history of basal cell carcinoma on the nose removed by Mohs surgery. - She also reported history of osteoporosis and broke left upper rib 1 year ago after sneezing. -Reviewed PET scan from 06/14/2021.  Mild hypermetabolism involving left aspect of the L5 vertebral body and also pedicle region surrounding the screw.  SUV 4.2.  There appears to be a lytic process on the CT scan.  The lytic left posterior seventh rib lesion is mildly hypermetabolic with SUV 5.59.  No other definite lytic hypermetabolic bone lesions seen. - We have reviewed left  seventh rib bone biopsy from 06/25/2021 consistent with plasma cell neoplasm.  Cells are positive for CD138, CD56 and a kappa restricted.  - Labs on 06/13/2021 M spike 1.7 g.  Immunofixation IgA kappa.  Free light chain shows kappa light chain 71, ratio 8.22.  LDH and beta-2 microglobulin was normal. - Bone marrow biopsy on 07/04/2021 consistent with plasma cell neoplasm with 26% plasma cells on aspirate with atypical features and 50 to 60% on core biopsy.  Chromosome analysis was normal.  Multiple myeloma FISH panel shows loss of NF1/chromosome 17/17 q.  Gain of CCN D1 or chromosome 11/11 q.  Gain of chromosome 4/4P.  Loss of material from the long-arm of chromosome 13.  These are all standard risk features. - 24-hour urine total protein to 40 mg.  Urine immunofixation was negative.  Urine kappa light chains were 4.13. - 8 cycles of Dara RVD from 07/26/2021 through 03/01/2022 - T11 and T12 vertebroplasty by Dr. Reatha Armour on 08/31/2021. - She has decided against bone marrow transplant after the advice of Dr. Lorenda Cahill.   Social/family history: - She is married and lives at home with her husband.  She has not worked but had help her husband and his work.  She is a non-smoker. - Maternal grandmother had colon cancer.  Mother had a squamous cell carcinoma of the skin.   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  Immunofixation shows IgG kappa.  FLC was 2.17. - Myeloma panel from 03/15/2022 showed M spike stable at 0.2 g.  Free light chain ratio improved to 1.88 with kappa light chains 14.5. - I have recommended discontinuation of Velcade.  We discussed maintenance Revlimid and Darzalex. - She will start Revlimid 10 mg 3 weeks on/1 week off on 03/21/2022.  I will start her on maintenance Darzalex on 04/15/2022.  If the myeloma labs are in complete remission,  we may discontinue Darzalex maintenance after 2-3 cycles to  give her better quality of life.  She will come back on 04/15/2022.  2.  Mid back pain (s/p vertebroplasty of T11 and T12 by Dr. Reatha Armour on 08/31/2021): - Continue to alternate between methocarbamol and Flexeril.  3.  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 Lorenda Cahill at [facility] in the of August.  No new treatment recommendations.  4.  Myeloma bone disease: - Continue Zometa every 4 weeks.  5.  ID prophylaxis: - Continue Valtrex for 1 more month.   Orders placed this encounter:  Orders Placed This Encounter  Procedures   CBC with Differential   Comprehensive metabolic panel   CBC with Differential   Comprehensive metabolic panel      Derek Jack, MD [place] [phone]

## 2022-03-20 NOTE — Patient Instructions (Addendum)
[facility] Discharge Instructions   You were seen and examined today by Dr. Delton Coombes.  He reviewed the results of your lab work which are normal/stable. Your light chain ratio has improved to 1.88. Your m-spike and immunofixation are still pending.   We will start you on Revlimid maintenance 10 mg three weeks and one week off every 28 days. For now, we will plan to continue Darzalex injections once a month along with Zometa infusions. Depending on how your myeloma labs look in the future, we can drop the Darzalex, too.   Return as scheduled.    Thank you for choosing [place] at [facility] to provide your oncology and hematology care.  To afford each patient quality time with our provider, please arrive at least 15 minutes before your scheduled appointment time.   If you have a lab appointment with the [facility] please come in thru the Main Entrance and check in at the main information desk.  You need to re-schedule your appointment should you arrive 10 or more minutes late.  We strive to give you quality time with our providers, and arriving late affects you and other patients whose appointments are after yours.  Also, if you no show three or more times for appointments you may be dismissed from the clinic at the providers discretion.     Again, thank you for choosing [facility].  Our hope is that these requests will decrease the amount of time that you wait before being seen by our physicians.       _____________________________________________________________  Should you have questions after your visit to [facility], please contact our office at [phone] and follow the prompts.  Our office hours are 8:00 a.m. and 4:30 p.m. Monday - Friday.  Please note that voicemails left after 4:00 p.m. may not be returned until the following business day.  We are closed weekends and major holidays.  You  do have access to a nurse 24-7, just call the main number to the clinic [phone] and do not press any options, hold on the line and a nurse will answer the phone.    For prescription refill requests, have your pharmacy contact our office and allow 72 hours.    Due to Covid, you will need to wear a mask upon entering the hospital. If you do not have a mask, a mask will be given to you at the Main Entrance upon arrival. For doctor visits, patients may have 1 support person age 77 or older with them. For treatment visits, patients can not have anyone with them due to social distancing guidelines and our immunocompromised population.

## 2022-03-21 ENCOUNTER — Other Ambulatory Visit: Payer: Self-pay

## 2022-03-22 ENCOUNTER — Other Ambulatory Visit: Payer: Self-pay

## 2022-03-25 ENCOUNTER — Other Ambulatory Visit: Payer: Self-pay | Admitting: *Deleted

## 2022-03-25 DIAGNOSIS — C9000 Multiple myeloma not having achieved remission: Secondary | ICD-10-CM

## 2022-03-25 LAB — IMMUNOFIXATION ELECTROPHORESIS
IgA: 78 mg/dL (ref 64–422)
IgG (Immunoglobin G), Serum: 583 mg/dL — ABNORMAL LOW (ref 586–1602)
IgM (Immunoglobulin M), Srm: 30 mg/dL (ref 26–217)
Total Protein ELP: 6.1 g/dL (ref 6.0–8.5)

## 2022-03-28 ENCOUNTER — Other Ambulatory Visit: Payer: Self-pay

## 2022-03-29 ENCOUNTER — Encounter (HOSPITAL_COMMUNITY): Payer: Self-pay | Admitting: Hematology

## 2022-03-29 ENCOUNTER — Encounter: Payer: Self-pay | Admitting: Hematology

## 2022-04-03 ENCOUNTER — Other Ambulatory Visit: Payer: Self-pay

## 2022-04-03 MED ORDER — LENALIDOMIDE 10 MG PO CAPS: 10.0000 mg | ORAL_CAPSULE | Freq: Every day | ORAL | 0 refills | Status: DC

## 2022-04-03 NOTE — Telephone Encounter (Signed)
Chart reviewed. Revlimid refilled per last office note with Dr. Katragadda.  

## 2022-04-10 ENCOUNTER — Other Ambulatory Visit: Payer: Self-pay

## 2022-04-17 ENCOUNTER — Inpatient Hospital Stay: Payer: Medicare Other

## 2022-04-17 ENCOUNTER — Inpatient Hospital Stay: Payer: Medicare Other | Attending: Hematology | Admitting: Hematology

## 2022-04-17 VITALS — BP 124/74 | HR 64 | Temp 97.4°F | Resp 18 | Ht 61.0 in | Wt 124.1 lb

## 2022-04-17 DIAGNOSIS — Z808 Family history of malignant neoplasm of other organs or systems: Secondary | ICD-10-CM | POA: Insufficient documentation

## 2022-04-17 DIAGNOSIS — Z800 Family history of malignant neoplasm of digestive organs: Secondary | ICD-10-CM | POA: Insufficient documentation

## 2022-04-17 DIAGNOSIS — C9000 Multiple myeloma not having achieved remission: Secondary | ICD-10-CM | POA: Diagnosis not present

## 2022-04-17 DIAGNOSIS — Z9221 Personal history of antineoplastic chemotherapy: Secondary | ICD-10-CM | POA: Insufficient documentation

## 2022-04-17 DIAGNOSIS — Z5112 Encounter for antineoplastic immunotherapy: Secondary | ICD-10-CM | POA: Diagnosis present

## 2022-04-17 LAB — CBC WITH DIFFERENTIAL/PLATELET
Abs Immature Granulocytes: 0.01 10*3/uL (ref 0.00–0.07)
Basophils Absolute: 0.1 10*3/uL (ref 0.0–0.1)
Basophils Relative: 2 %
Eosinophils Absolute: 0.2 10*3/uL (ref 0.0–0.5)
Eosinophils Relative: 5 %
HCT: 39 % (ref 36.0–46.0)
Hemoglobin: 12.3 g/dL (ref 12.0–15.0)
Immature Granulocytes: 0 %
Lymphocytes Relative: 34 %
Lymphs Abs: 1.5 10*3/uL (ref 0.7–4.0)
MCH: 28.4 pg (ref 26.0–34.0)
MCHC: 31.5 g/dL (ref 30.0–36.0)
MCV: 90.1 fL (ref 80.0–100.0)
Monocytes Absolute: 0.4 10*3/uL (ref 0.1–1.0)
Monocytes Relative: 9 %
Neutro Abs: 2.3 10*3/uL (ref 1.7–7.7)
Neutrophils Relative %: 50 %
Platelets: 252 10*3/uL (ref 150–400)
RBC: 4.33 MIL/uL (ref 3.87–5.11)
RDW: 18.4 % — ABNORMAL HIGH (ref 11.5–15.5)
WBC: 4.6 10*3/uL (ref 4.0–10.5)
nRBC: 0 % (ref 0.0–0.2)

## 2022-04-17 LAB — COMPREHENSIVE METABOLIC PANEL
ALT: 25 U/L (ref 0–44)
AST: 28 U/L (ref 15–41)
Albumin: 4.4 g/dL (ref 3.5–5.0)
Alkaline Phosphatase: 58 U/L (ref 38–126)
Anion gap: 8 (ref 5–15)
BUN: 15 mg/dL (ref 8–23)
CO2: 26 mmol/L (ref 22–32)
Calcium: 9 mg/dL (ref 8.9–10.3)
Chloride: 102 mmol/L (ref 98–111)
Creatinine, Ser: 0.77 mg/dL (ref 0.44–1.00)
GFR, Estimated: >60 mL/min (ref >60)
Glucose, Bld: 82 mg/dL (ref 70–99)
Potassium: 3.9 mmol/L (ref 3.5–5.1)
Sodium: 136 mmol/L (ref 135–145)
Total Bilirubin: 0.5 mg/dL (ref 0.3–1.2)
Total Protein: 7.2 g/dL (ref 6.5–8.1)

## 2022-04-17 LAB — LACTATE DEHYDROGENASE: LDH: 162 U/L (ref 98–192)

## 2022-04-17 LAB — MAGNESIUM: Magnesium: 1.9 mg/dL (ref 1.7–2.4)

## 2022-04-17 MED ORDER — ZOLEDRONIC ACID 4 MG/100ML IV SOLN
4.0000 mg | Freq: Once | INTRAVENOUS | Status: AC
  Administered 2022-04-17: 4 mg via INTRAVENOUS
  Filled 2022-04-17: qty 100

## 2022-04-17 MED ORDER — DARATUMUMAB-HYALURONIDASE-FIHJ 1800-30000 MG-UT/15ML ~~LOC~~ SOLN
1800.0000 mg | Freq: Once | SUBCUTANEOUS | Status: AC
  Administered 2022-04-17: 1800 mg via SUBCUTANEOUS
  Filled 2022-04-17: qty 15

## 2022-04-17 MED ORDER — SODIUM CHLORIDE 0.9 % IV SOLN: Freq: Once | INTRAVENOUS | Status: AC

## 2022-04-17 NOTE — Patient Instructions (Signed)
Amanda Conner  Discharge Instructions: Thank you for choosing [facility] to provide your oncology and hematology care.  If you have a lab appointment with the [facility], please come in thru the Main Entrance and check in at the main information desk.  Wear comfortable clothing and clothing appropriate for easy access to any Portacath or PICC line.   We strive to give you quality time with your provider. You may need to reschedule your appointment if you arrive late (15 or more minutes).  Arriving late affects you and other patients whose appointments are after yours.  Also, if you miss three or more appointments without notifying the office, you may be dismissed from the clinic at the provider's discretion.      For prescription refill requests, have your pharmacy contact our office and allow 72 hours for refills to be completed.    Today you received the following chemotherapy and/or immunotherapy agents daratumumab and zometa.  Zoledronic Acid Injection (Cancer) What is this medication? ZOLEDRONIC ACID (ZOE le dron ik AS id) treats high calcium levels in the blood caused by cancer. It may also be used with chemotherapy to treat weakened bones caused by cancer. It works by slowing down the release of calcium from bones. This lowers calcium levels in your blood. It also makes your bones stronger and less likely to break (fracture). It belongs to a group of medications called bisphosphonates. This medicine may be used for other purposes; ask your health care provider or pharmacist if you have questions. COMMON BRAND NAME(S): Zometa, Zometa Powder What should I tell my care team before I take this medication? They need to know if you have any of these conditions: Dehydration Dental disease Kidney disease Liver disease Low levels of calcium in the blood Lung or breathing disease, such as asthma Receiving steroids, such as dexamethasone or prednisone An  unusual or allergic reaction to zoledronic acid, other medications, foods, dyes, or preservatives Pregnant or trying to get pregnant Breast-feeding How should I use this medication? This medication is injected into a vein. It is given by your care team in a hospital or clinic setting. Talk to your care team about the use of this medication in children. Special care may be needed. Overdosage: If you think you have taken too much of this medicine contact a poison control center or emergency room at once. NOTE: This medicine is only for you. Do not share this medicine with others. What if I miss a dose? Keep appointments for follow-up doses. It is important not to miss your dose. Call your care team if you are unable to keep an appointment. What may interact with this medication? Certain antibiotics given by injection Diuretics, such as bumetanide, furosemide NSAIDs, medications for pain and inflammation, such as ibuprofen or naproxen Teriparatide Thalidomide This list may not describe all possible interactions. Give your health care provider a list of all the medicines, herbs, non-prescription drugs, or dietary supplements you use. Also tell them if you smoke, drink alcohol, or use illegal drugs. Some items may interact with your medicine. What should I watch for while using this medication? Visit your care team for regular checks on your progress. It may be some time before you see the benefit from this medication. Some people who take this medication have severe bone, joint, or muscle pain. This medication may also increase your risk for jaw problems or a broken thigh bone. Tell your care team right away if you  have severe pain in your jaw, bones, joints, or muscles. Tell you care team if you have any pain that does not go away or that gets worse. Tell your dentist and dental surgeon that you are taking this medication. You should not have major dental surgery while on this medication. See your  dentist to have a dental exam and fix any dental problems before starting this medication. Take good care of your teeth while on this medication. Make sure you see your dentist for regular follow-up appointments. You should make sure you get enough calcium and vitamin D while you are taking this medication. Discuss the foods you eat and the vitamins you take with your care team. Check with your care team if you have severe diarrhea, nausea, and vomiting, or if you sweat a lot. The loss of too much body fluid may make it dangerous for you to take this medication. You may need bloodwork while taking this medication. Talk to your care team if you wish to become pregnant or think you might be pregnant. This medication can cause serious birth defects. What side effects may I notice from receiving this medication? Side effects that you should report to your care team as soon as possible: Allergic reactions--skin rash, itching, hives, swelling of the face, lips, tongue, or throat Kidney injury--decrease in the amount of urine, swelling of the ankles, hands, or feet Low calcium level--muscle pain or cramps, confusion, tingling, or numbness in the hands or feet Osteonecrosis of the jaw--pain, swelling, or redness in the mouth, numbness of the jaw, poor healing after dental work, unusual discharge from the mouth, visible bones in the mouth Severe bone, joint, or muscle pain Side effects that usually do not require medical attention (report to your care team if they continue or are bothersome): Constipation Fatigue Fever Loss of appetite Nausea Stomach pain This list may not describe all possible side effects. Call your doctor for medical advice about side effects. You may report side effects to FDA at 1-800-FDA-1088. Where should I keep my medication? This medication is given in a hospital or clinic. It will not be stored at home. NOTE: This sheet is a summary. It may not cover all possible information.  If you have questions about this medicine, talk to your doctor, pharmacist, or health care provider.  2023 Elsevier/Gold Standard (2021-08-16 00:00:00) Daratumumab; Hyaluronidase Injection What is this medication? DARATUMUMAB; HYALURONIDASE (dar a toom ue mab; hye al ur ON i dase) treats multiple myeloma, a type of bone marrow cancer. Daratumumab works by blocking a protein that causes cancer cells to grow and multiply. This helps to slow or stop the spread of cancer cells. Hyaluronidase works by increasing the absorption of other medications in the body to help them work better. This medication may also be used treat amyloidosis, a condition that causes the buildup of a protein (amyloid) in your body. It works by reducing the buildup of this protein, which decreases symptoms. It is a combination medication that contains a monoclonal antibody. This medicine may be used for other purposes; ask your health care provider or pharmacist if you have questions. COMMON BRAND NAME(S): DARZALEX FASPRO What should I tell my care team before I take this medication? They need to know if you have any of these conditions: Heart disease Infection, such as chickenpox, cold sores, herpes, hepatitis B Lung or breathing disease An unusual or allergic reaction to daratumumab, hyaluronidase, other medications, foods, dyes, or preservatives Pregnant or trying to get pregnant Breast-feeding  How should I use this medication? This medication is injected under the skin. It is given by your care team in a hospital or clinic setting. Talk to your care team about the use of this medication in children. Special care may be needed. Overdosage: If you think you have taken too much of this medicine contact a poison control center or emergency room at once. NOTE: This medicine is only for you. Do not share this medicine with others. What if I miss a dose? Keep appointments for follow-up doses. It is important not to miss your  dose. Call your care team if you are unable to keep an appointment. What may interact with this medication? Interactions have not been studied. This list may not describe all possible interactions. Give your health care provider a list of all the medicines, herbs, non-prescription drugs, or dietary supplements you use. Also tell them if you smoke, drink alcohol, or use illegal drugs. Some items may interact with your medicine. What should I watch for while using this medication? Your condition will be monitored carefully while you are receiving this medication. This medication can cause serious allergic reactions. To reduce your risk, your care team may give you other medication to take before receiving this one. Be sure to follow the directions from your care team. This medication can affect the results of blood tests to match your blood type. These changes can last for up to 6 months after the final dose. Your care team will do blood tests to match your blood type before you start treatment. Tell all of your care team that you are being treated with this medication before receiving a blood transfusion. This medication can affect the results of some tests used to determine treatment response; extra tests may be needed to evaluate response. Talk to your care team if you wish to become pregnant or think you are pregnant. This medication can cause serious birth defects if taken during pregnancy and for 3 months after the last dose. A reliable form of contraception is recommended while taking this medication and for 3 months after the last dose. Talk to your care team about effective forms of contraception. Do not breast-feed while taking this medication. What side effects may I notice from receiving this medication? Side effects that you should report to your care team as soon as possible: Allergic reactions--skin rash, itching, hives, swelling of the face, lips, tongue, or throat Heart rhythm  changes--fast or irregular heartbeat, dizziness, feeling faint or lightheaded, chest pain, trouble breathing Infection--fever, chills, cough, sore throat, wounds that don't heal, pain or trouble when passing urine, general feeling of discomfort or being unwell Infusion reactions--chest pain, shortness of breath or trouble breathing, feeling faint or lightheaded Sudden eye pain or change in vision such as blurry vision, seeing halos around lights, vision loss Unusual bruising or bleeding Side effects that usually do not require medical attention (report to your care team if they continue or are bothersome): Constipation Diarrhea Fatigue Nausea Pain, tingling, or numbness in the hands or feet Swelling of the ankles, hands, or feet This list may not describe all possible side effects. Call your doctor for medical advice about side effects. You may report side effects to FDA at 1-800-FDA-1088. Where should I keep my medication? This medication is given in a hospital or clinic. It will not be stored at home. NOTE: This sheet is a summary. It may not cover all possible information. If you have questions about this medicine, talk to  your doctor, pharmacist, or health care provider.  2023 Elsevier/Gold Standard (2021-10-24 00:00:00)       To help prevent nausea and vomiting after your treatment, we encourage you to take your nausea medication as directed.  BELOW ARE SYMPTOMS THAT SHOULD BE REPORTED IMMEDIATELY: *FEVER GREATER THAN 100.4 F (38 C) OR HIGHER *CHILLS OR SWEATING *NAUSEA AND VOMITING THAT IS NOT CONTROLLED WITH YOUR NAUSEA MEDICATION *UNUSUAL SHORTNESS OF BREATH *UNUSUAL BRUISING OR BLEEDING *URINARY PROBLEMS (pain or burning when urinating, or frequent urination) *BOWEL PROBLEMS (unusual diarrhea, constipation, pain near the anus) TENDERNESS IN MOUTH AND THROAT WITH OR WITHOUT PRESENCE OF ULCERS (sore throat, sores in mouth, or a toothache) UNUSUAL RASH, SWELLING OR PAIN   UNUSUAL VAGINAL DISCHARGE OR ITCHING   Items with * indicate a potential emergency and should be followed up as soon as possible or go to the Emergency Department if any problems should occur.  Please show the CHEMOTHERAPY ALERT CARD or IMMUNOTHERAPY ALERT CARD at check-in to the Emergency Department and triage nurse.  Should you have questions after your visit or need to cancel or reschedule your appointment, please contact [facility] [phone]  and follow the prompts.  Office hours are 8:00 a.m. to 4:30 p.m. Monday - Friday. Please note that voicemails left after 4:00 p.m. may not be returned until the following business day.  We are closed weekends and major holidays. You have access to a nurse at all times for urgent questions. Please call the main number to the clinic [phone] and follow the prompts.  For any non-urgent questions, you may also contact your provider using MyChart. We now offer e-Visits for anyone 34 and older to request care online for non-urgent symptoms. For details visit [email or web address].   Also download the MyChart app! Go to the app store, search "MyChart", open the app, select [facility], and log in with your MyChart username and password.  Masks are optional in the cancer centers. If you would like for your care team to wear a mask while they are taking care of you, please let them know. You may have one support person who is at least 72 years old accompany you for your appointments.

## 2022-04-17 NOTE — Patient Instructions (Signed)
[place] at [facility] Discharge Instructions   You were seen and examined today by Dr. Delton Coombes.  He reviewed the results of your lab work which are normal/stable.   We will proceed with your Zometa and Darzalex injection today.   We will see you back in 6 weeks. We will repeat lab work one week prior.    Thank you for choosing [facility] at [facility] to provide your oncology and hematology care.  To afford each patient quality time with our provider, please arrive at least 15 minutes before your scheduled appointment time.   If you have a lab appointment with the [facility] please come in thru the Main Entrance and check in at the main information desk.  You need to re-schedule your appointment should you arrive 10 or more minutes late.  We strive to give you quality time with our providers, and arriving late affects you and other patients whose appointments are after yours.  Also, if you no show three or more times for appointments you may be dismissed from the clinic at the providers discretion.     Again, thank you for choosing [facility].  Our hope is that these requests will decrease the amount of time that you wait before being seen by our physicians.       _____________________________________________________________  Should you have questions after your visit to [facility], please contact our office at [phone] and follow the prompts.  Our office hours are 8:00 a.m. and 4:30 p.m. Monday - Friday.  Please note that voicemails left after 4:00 p.m. may not be returned until the following business day.  We are closed weekends and major holidays.  You do have access to a nurse 24-7, just call the main number to the clinic [phone] and do not press any options, hold on the line and a nurse will answer the phone.    For prescription refill requests, have your pharmacy contact our office and allow  72 hours.    Due to Covid, you will need to wear a mask upon entering the hospital. If you do not have a mask, a mask will be given to you at the Main Entrance upon arrival. For doctor visits, patients may have 1 support person age 79 or older with them. For treatment visits, patients can not have anyone with them due to social distancing guidelines and our immunocompromised population.

## 2022-04-17 NOTE — Progress Notes (Signed)
Patient took premeds at home.    Patient tolerated Daratumumab injection with no complaints voiced.  See MAR for details.  Labs reviewed. Injection site clean and dry with no bruising or swelling noted at site.  Band aid applied.    Patient tolerated therapy with no complaints voiced. Labs reviewed.   Side effects with management reviewed with understanding verbalized.  Peripheral IV site clean and dry with no bruising or swelling noted at site.  Good blood return noted before and after administration of therapy.  Band aid applied.  Patient left in satisfactory condition with VSS and no s/s of distress noted.

## 2022-04-18 ENCOUNTER — Other Ambulatory Visit: Payer: Self-pay

## 2022-04-18 LAB — KAPPA/LAMBDA LIGHT CHAINS
Kappa free light chain: 12.8 mg/L (ref 3.3–19.4)
Kappa, lambda light chain ratio: 1.75 — ABNORMAL HIGH (ref 0.26–1.65)
Lambda free light chains: 7.3 mg/L (ref 5.7–26.3)

## 2022-04-19 ENCOUNTER — Other Ambulatory Visit: Payer: Self-pay

## 2022-04-20 ENCOUNTER — Other Ambulatory Visit: Payer: Self-pay

## 2022-04-22 LAB — PROTEIN ELECTROPHORESIS, SERUM
A/G Ratio: 1.6 (ref 0.7–1.7)
Albumin ELP: 4.1 g/dL (ref 2.9–4.4)
Alpha-1-Globulin: 0.3 g/dL (ref 0.0–0.4)
Alpha-2-Globulin: 0.7 g/dL (ref 0.4–1.0)
Beta Globulin: 0.9 g/dL (ref 0.7–1.3)
Gamma Globulin: 0.6 g/dL (ref 0.4–1.8)
Globulin, Total: 2.6 g/dL (ref 2.2–3.9)
M-Spike, %: 0.2 g/dL — ABNORMAL HIGH
Total Protein ELP: 6.7 g/dL (ref 6.0–8.5)

## 2022-04-24 LAB — IMMUNOFIXATION ELECTROPHORESIS
IgA: 91 mg/dL (ref 64–422)
IgG (Immunoglobin G), Serum: 662 mg/dL (ref 586–1602)
IgM (Immunoglobulin M), Srm: 39 mg/dL (ref 26–217)
Total Protein ELP: 6.6 g/dL (ref 6.0–8.5)

## 2022-04-26 ENCOUNTER — Encounter (HOSPITAL_COMMUNITY): Payer: Self-pay | Admitting: Hematology

## 2022-04-26 ENCOUNTER — Encounter: Payer: Self-pay | Admitting: Hematology

## 2022-04-26 NOTE — Progress Notes (Signed)
[place], [place] [address]   CLINIC:  Medical Oncology/Hematology  PCP:  Yvone Neu, MD [facility] [address] /* [phone]   REASON FOR VISIT:  Follow-up for multiple myeloma  PRIOR THERAPY: none  NGS Results: not done  CURRENT THERAPY: DaraVRd (Daratumumab SQ) q21d x 6 Cycles (Induction/Consolidation)  BRIEF ONCOLOGIC HISTORY:  Oncology History  Multiple myeloma (Amanda Conner)  07/17/2021 Initial Diagnosis   Multiple myeloma (Amanda Conner)   07/26/2021 - 03/15/2022 Chemotherapy   Patient is on Treatment Plan : MYELOMA NEWLY DIAGNOSED TRANSPLANT CANDIDATE DaraVRd (Daratumumab SQ) q21d x 6 Cycles (Induction/Consolidation)     04/17/2022 -  Chemotherapy   Patient is on Treatment Plan : MYELOMA NEWLY DIAGNOSED TRANSPLANT CANDIDATE Daratumumab SQ + Lenalidomide q28d (Maintenance)       CANCER STAGING:  Cancer Staging  Multiple myeloma ([place]) Staging form: Multiple Myeloma, AJCC 6th Edition - Clinical stage from 07/17/2021: Stage IA - Unsigned   INTERVAL HISTORY:  [name], a 71 y.o. female, seen for follow-up of multiple myeloma.  Chronic cough and shortness of breath are stable.  She complains of low back pain which is slightly below her previous pain.  She is tolerating Revlimid very well.Marland Kitchen  REVIEW OF SYSTEMS:  Review of Systems  Constitutional:  Positive for fatigue.  Gastrointestinal:  Positive for constipation. Negative for abdominal pain.  Musculoskeletal:  Positive for back pain (5/10).  All other systems reviewed and are negative.   PAST MEDICAL/SURGICAL HISTORY:  Past Medical History:  Diagnosis Date   Arthritis    osteoarthritis   Back pain    Bronchiectasis (HCC)    Cancer (HCC)    basal cell nose   Chronic cough    Complication of anesthesia    Dyspnea    Gastrointestinal symptoms    GERD (gastroesophageal reflux disease)    Glaucoma    History of hiatal hernia     Hyperlipemia    Hypertension    Multiple myeloma (HCC)    Osteopenia    Pneumonia    PONV (postoperative nausea and vomiting)    "only one time"   Spondylisthesis    Past Surgical History:  Procedure Laterality Date   ABDOMINAL EXPOSURE N/A 12/29/2018   Procedure: ABDOMINAL EXPOSURE;  Surgeon: Angelia Mould, MD;  Location: [facility];  Service: Vascular;  Laterality: N/A;   ABDOMINOPLASTY  2002   ABDOMINOPLASTY  1970's   ANKLE SURGERY Left 10/2015   ANTERIOR LATERAL LUMBAR FUSION WITH PERCUTANEOUS SCREW 3 LEVEL Left 12/29/2018   Procedure: Lumbar Two to Lumbar Five Anterolateral lumbar interbody fusion;  Surgeon: Erline Levine, MD;  Location: [facility];  Service: Neurosurgery;  Laterality: Left;  anterolateral   ANTERIOR LUMBAR FUSION N/A 12/29/2018   Procedure: Lumbar Five Sacral One Anterior lumbar interbody fusion;  Surgeon: Erline Levine, MD;  Location: [facility];  Service: Neurosurgery;  Laterality: N/A;  anterior approach   BASAL CELL CARCINOMA EXCISION  06/2018   nose   BLEPHAROPLASTY Bilateral 1999   BREAST ENHANCEMENT SURGERY  1970's   COLONOSCOPY     CYSTOURETHROSCOPY  2018   [place] J ureteal stents   DECOMPRESSION CORE HIP Left 2014   FACIAL COSMETIC SURGERY  2004   FLUOROSCOPY GUIDANCE     HIP ARTHROPLASTY Left    INCISIONAL HERNIA REPAIR N/A 10/08/2019   Procedure: OPEN INCISIONAL HERNIA REPAIR WITH MESH;  Surgeon: Armandina Gemma, MD;  Location: [facility];  Service: General;  Laterality: N/A;   JOINT REPLACEMENT Left    Hip; 05/2014   KYPHOPLASTY N/A 08/31/2021   Procedure: KYPHOPLASTY THORACIC ELEVEN, THORACIC TWELVE;  Surgeon: Karsten Ro, DO;  Location: [facility];  Service: Neurosurgery;  Laterality: N/A;   LAPAROSCOPIC PARTIAL COLECTOMY  06/26/2017   LUMBAR PERCUTANEOUS PEDICLE SCREW 4 LEVEL N/A 12/29/2018   Procedure: Lumbar Percutaneous Pedicle Screw Placement Lumbar two-Sacral one;  Surgeon: Erline Levine, MD;  Location: [facility];  Service: Neurosurgery;  Laterality:  N/A;   MOHS SURGERY  2019   nose   PARTIAL COLECTOMY  06/2017   for diverticulitis   REMOVAL OF BILATERAL TISSUE EXPANDERS WITH PLACEMENT OF BILATERAL BREAST IMPLANTS  2004   THIGH LIFT  1994   TOE FUSION Right    great toe   TONSILLECTOMY     UMBILICAL HERNIA REPAIR     with tummy tuck   UMBILICAL HERNIA REPAIR  2002    SOCIAL HISTORY:  Social History   Socioeconomic History   Marital status: Married    Spouse name: Not on file   Number of children: Not on file   Years of education: Not on file   Highest education level: Not on file  Occupational History   Not on file  Tobacco Use   Smoking status: Never   Smokeless tobacco: Never  Vaping Use   Vaping Use: Never used  Substance and Sexual Activity   Alcohol use: Yes    Comment: occasional   Drug use: Never   Sexual activity: Not on file  Other Topics Concern   Not on file  Social History Narrative   Not on file   Social Determinants of Health   Financial Resource Strain: Low Risk  (07/24/2021)   Overall Financial Resource Strain (CARDIA)    Difficulty of Paying Living Expenses: Not hard at all  Food Insecurity: No Food Insecurity (07/24/2021)   Hunger Vital Sign    Worried About Running Out of Food in the Last Year: Never true    [place] in the Last Year: Never true  Transportation Needs: No Transportation Needs (07/24/2021)   PRAPARE - Hydrologist (Medical): No    Lack of Transportation (Non-Medical): No  Physical Activity: Not on file  Stress: Not on file  Social Connections: Socially Integrated (07/24/2021)   Social Connection and Isolation Panel [NHANES]    Frequency of Communication with Friends and Family: More than three times a week    Frequency of Social Gatherings with Friends and Family: More than three times a week    Attends Religious Services: 1 to 4 times per year    Active Member of [employer] or Organizations: No    Attends Music therapist: 1 to  4 times per year    Marital Status: Married  Human resources officer Violence: Not on file    FAMILY HISTORY:  Family History  Problem Relation Age of Onset   Hypertension Mother    COPD Mother    Hypertension Father    Heart disease Father     CURRENT MEDICATIONS:  Current Outpatient Medications  Medication Sig Dispense Refill   albuterol (VENTOLIN HFA) 108 (90 Base) MCG/ACT inhaler Inhale 2 puffs into the lungs every 6 (six) hours as needed for wheezing or shortness of breath.     aspirin EC 81 MG tablet Take 81 mg by mouth daily. Swallow whole.     Baclofen 5 MG TABS Take 1 tablet by mouth  3 (three) times daily as needed.     benzonatate (TESSALON) 100 MG capsule Take 100 mg by mouth 3 (three) times daily as needed for cough.     Biotin 1000 MCG tablet Take 1,000 mcg by mouth daily.     calcitonin, salmon, (MIACALCIN/FORTICAL) 200 UNIT/ACT nasal spray Place 1 spray into alternate nostrils daily.     Calcium Carb-Cholecalciferol 600-800 MG-UNIT TABS Take 1 tablet by mouth daily.     daratumumab-hyaluronidase-fihj (DARZALEX FASPRO) 1800-30000 MG-UT/15ML SOLN Inject 1,800 mg into the skin once a week.     dexamethasone (DECADRON) 2 MG tablet Take 5 tablets (10 mg total) by mouth every 30 (thirty) days. 20 tablet 4   dextromethorphan-guaiFENesin (MUCINEX DM) 30-600 MG 12hr tablet Take 1 tablet by mouth daily.      estrogen, conjugated,-medroxyprogesterone (PREMPRO) 0.45-1.5 MG tablet Take 1 tablet by mouth daily.     feeding supplement (ENSURE ENLIVE / ENSURE PLUS) LIQD Take 237 mLs by mouth 2 (two) times daily between meals. (Patient taking differently: Take 237 mLs by mouth daily.) 237 mL 12   fluticasone (FLONASE) 50 MCG/ACT nasal spray Place 1 spray into both nostrils daily.     furosemide (LASIX) 20 MG tablet TAKE 1 TABLET BY MOUTH EVERY DAY AS NEEDED 90 tablet 3   hydrochlorothiazide (HYDRODIURIL) 25 MG tablet Take 25 mg by mouth daily as needed (swelling).     HYDROcodone  bit-homatropine (HYCODAN) 5-1.5 MG/5ML syrup Take 5 mLs by mouth every 6 (six) hours as needed for cough. 473 mL 0   Lactulose 20 GM/30ML SOLN Take 30 ml by mouth every 3 hours until bowel movement is had, then take 30 ml daily (Patient taking differently: Take 20 g by mouth daily as needed (constipation).) 946 mL 3   latanoprost (XALATAN) 0.005 % ophthalmic solution Place 1 drop into both eyes at bedtime.      lenalidomide (REVLIMID) 10 MG capsule Take 1 capsule (10 mg total) by mouth daily. 21 days on, 7 days off 21 capsule 0   linaclotide (LINZESS) 145 MCG CAPS capsule Take 145 mcg by mouth daily as needed (constipation).     lisinopril (ZESTRIL) 20 MG tablet Take 20 mg by mouth daily.     loratadine (CLARITIN) 10 MG tablet Take 10 mg by mouth daily.     methocarbamol (ROBAXIN) 500 MG tablet Take 1 tablet (500 mg total) by mouth every 6 (six) hours as needed for muscle spasms. 40 tablet 3   omeprazole (PRILOSEC) 20 MG capsule Take 20 mg by mouth daily.     ondansetron (ZOFRAN ODT) 4 MG disintegrating tablet Take 1 tablet (4 mg total) by mouth every 4 (four) hours as needed for nausea or vomiting. 20 tablet 6   Oxycodone HCl 10 MG TABS Take 1 tablet (10 mg total) by mouth every 6 (six) hours as needed. 120 tablet 0   pantoprazole (PROTONIX) 40 MG tablet Take 40 mg by mouth daily.     sucralfate (CARAFATE) 1 g tablet Take 1 g by mouth 4 (four) times daily.     SUMAtriptan (IMITREX) 100 MG tablet Take 100 mg by mouth every 2 (two) hours as needed for migraine. May repeat in 2 hours if headache persists or recurs.     UNABLE TO FIND Place 0.5 g into both eyes 3 (three) times daily. Med Name: Jearld Pies (otepredol)     No current facility-administered medications for this visit.    ALLERGIES:  Allergies  Allergen Reactions   Tobramycin-Dexamethasone Other (  See Comments)    Itching and swelling   Ethambutol Other (See Comments)    Fever   Neomycin-Polymyxin-Dexameth Itching and Swelling    Amikacin Other (See Comments)    Eosinophilia with chills and aching when given with cefoxitin   Biaxin [Clarithromycin] Itching   Cefoxitin Other (See Comments)    Eosinophlia when given with amikacin   Sulfamethoxazole-Trimethoprim Itching   Vancomycin     "Got red splotches on skin after several doses"   Bacitracin-Polymyxin B Rash    PHYSICAL EXAM:  Performance status (ECOG): 1 - Symptomatic but completely ambulatory  Vitals:   04/17/22 1022  BP: 124/74  Pulse: 64  Resp: 18  Temp: (!) 97.4 F (36.3 C)  SpO2: 94%   Wt Readings from Last 3 Encounters:  04/17/22 124 lb 1.6 oz (56.3 kg)  03/20/22 126 lb 3.2 oz (57.2 kg)  03/15/22 126 lb 9.6 oz (57.4 kg)   Physical Exam Vitals reviewed.  Constitutional:      Appearance: Normal appearance.     Comments: In wheelchair  Cardiovascular:     Rate and Rhythm: Normal rate and regular rhythm.     Pulses: Normal pulses.     Heart sounds: Normal heart sounds.  Pulmonary:     Effort: Pulmonary effort is normal.     Breath sounds: Normal breath sounds.  Neurological:     General: No focal deficit present.     Mental Status: She is alert and oriented to person, place, and time.  Psychiatric:        Mood and Affect: Mood normal.        Behavior: Behavior normal.     LABORATORY DATA:  I have reviewed the labs as listed.     Latest Ref Rng & Units 04/17/2022    9:52 AM 03/15/2022   12:11 PM 03/08/2022   12:06 PM  CBC  WBC 4.0 - 10.5 K/uL 4.6  7.0  6.2   Hemoglobin 12.0 - 15.0 g/dL 12.3  11.4  10.9   Hematocrit 36.0 - 46.0 % 39.0  35.3  34.2   Platelets 150 - 400 K/uL 252  233  288       Latest Ref Rng & Units 04/17/2022    9:52 AM 03/15/2022   12:11 PM 03/08/2022   12:06 PM  CMP  Glucose 70 - 99 mg/dL 82  98  100   BUN 8 - 23 mg/dL _0 Creatinine 0.44 - 1.00 mg/dL 0.77  0.76  0.72   Sodium 135 - 145 mmol/L 136  136  135   Potassium 3.5 - 5.1 mmol/L 3.9  4.2  4.4   Chloride 98 - 111 mmol/L 102  102  101   CO2  22 - 32 mmol/L _1 Calcium 8.9 - 10.3 mg/dL 9.0  8.7  8.9   Total Protein 6.5 - 8.1 g/dL 7.2  6.6  6.6   Total Bilirubin 0.3 - 1.2 mg/dL 0.5  0.5  0.7   Alkaline Phos 38 - 126 U/L 58  61  60   AST 15 - 41 U/L _2 ALT 0 - 44 U/L _3 DIAGNOSTIC IMAGING:  I have independently reviewed the scans and discussed with the patient. No results found.   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  Wilburn Mylar - She reported discomfort in the left posterior back for the last 1 month.  She was seen by Dr. Chauncey Reading and x-rays showed abnormality.  A CT scan of the chest was done on 05/24/2021. - CT chest report reviewed by me from 05/24/2021 showed expansile oval-shaped lesion within the posterior aspect of the seventh rib with a nodule measuring 2.1 x 1.1 cm.  Adjacent cortex demonstrates area of thinning and destruction.  Stable emphysematous and interstitial changes within the lungs.  Stable pulmonary nodules within the right upper lobe and left upper lobe when compared to CT from September 2020. - CT of the abdomen and pelvis did not show any significant abnormalities. - She denies any fevers, night sweats or weight loss in the last 6 months.  She had history of basal cell carcinoma on the nose removed by Mohs surgery. - She also reported history of osteoporosis and broke left upper rib 1 year ago after sneezing. -Reviewed PET scan from 06/14/2021.  Mild hypermetabolism involving left aspect of the L5 vertebral body and also pedicle region surrounding the screw.  SUV 4.2.  There appears to be a lytic process on the CT scan.  The lytic left posterior seventh rib lesion is mildly hypermetabolic with SUV 9.62.  No other definite lytic hypermetabolic bone lesions seen. - We have reviewed left seventh rib bone biopsy from 06/25/2021 consistent with plasma cell neoplasm.  Cells are positive for CD138, CD56 and a kappa  restricted.  - Labs on 06/13/2021 M spike 1.7 g.  Immunofixation IgA kappa.  Free light chain shows kappa light chain 71, ratio 8.22.  LDH and beta-2 microglobulin was normal. - Bone marrow biopsy on 07/04/2021 consistent with plasma cell neoplasm with 26% plasma cells on aspirate with atypical features and 50 to 60% on core biopsy.  Chromosome analysis was normal.  Multiple myeloma FISH panel shows loss of NF1/chromosome 17/17 q.  Gain of CCN D1 or chromosome 11/11 q.  Gain of chromosome 4/4P.  Loss of material from the long-arm of chromosome 13.  These are all standard risk features. - 24-hour urine total protein to 40 mg.  Urine immunofixation was negative.  Urine kappa light chains were 4.13. - 8 cycles of Dara RVD from 07/26/2021 through 03/01/2022 - T11 and T12 vertebroplasty by Dr. Reatha Armour on 08/31/2021. - She has decided against bone marrow transplant after the advice of Dr. Lorenda Cahill.   Social/family history: - She is married and lives at home with her husband.  She has not worked but had help her husband and his work.  She is a non-smoker. - Maternal grandmother had colon cancer.  Mother had a squamous cell carcinoma of the skin.   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  Free light chain ratio is 1.75 and stable.  Immunofixation shows IgG kappa. - Continue Revlimid 10 mg 3 weeks on/1 week off. - I have recommended starting Darzalex monthly.  We discussed side effects.  She will start today.  If she achieves complete remission and stable, we may discontinue Darzalex for better quality of life. - Recommend RTC in 6 weeks with repeat myeloma labs.  2.  Mid back pain (s/p vertebroplasty of T11 and T12 by Dr. Reatha Armour on 08/31/2021): - She is not taking Flexeril and methocarbamol.  She complains of lower back pain.  She will follow-up with Dr. Reatha Armour.  3.  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 [facility]da [facility] at [facility].  No new recommendations.  4.  Myeloma bone disease: - Continue Zometa every 4 weeks.  5.  ID prophylaxis: - Continue Valtrex daily.   Orders placed this encounter:  Orders Placed This Encounter  Procedures   CBC with Differential   Comprehensive metabolic panel   Immunofixation electrophoresis      Derek Jack, MD [facility] [phone]

## 2022-05-06 ENCOUNTER — Other Ambulatory Visit: Payer: Self-pay

## 2022-05-06 MED ORDER — LENALIDOMIDE 10 MG PO CAPS: 10.0000 mg | ORAL_CAPSULE | Freq: Every day | ORAL | 0 refills | Status: DC

## 2022-05-06 NOTE — Telephone Encounter (Signed)
Chart reviewed. Revlimid refilled per last office note with Dr. Katragadda.  

## 2022-05-16 ENCOUNTER — Ambulatory Visit: Payer: Medicare Other | Admitting: Hematology

## 2022-05-16 ENCOUNTER — Other Ambulatory Visit: Payer: Medicare Other

## 2022-05-16 ENCOUNTER — Ambulatory Visit: Payer: Medicare Other

## 2022-05-29 ENCOUNTER — Inpatient Hospital Stay: Payer: Medicare Other

## 2022-05-29 ENCOUNTER — Ambulatory Visit: Payer: Medicare Other

## 2022-05-29 ENCOUNTER — Ambulatory Visit: Payer: Medicare Other | Admitting: Hematology

## 2022-05-30 ENCOUNTER — Inpatient Hospital Stay: Payer: Medicare Other | Attending: Hematology

## 2022-05-30 ENCOUNTER — Other Ambulatory Visit: Payer: Self-pay

## 2022-05-30 ENCOUNTER — Inpatient Hospital Stay: Payer: Medicare Other

## 2022-05-30 ENCOUNTER — Inpatient Hospital Stay (HOSPITAL_BASED_OUTPATIENT_CLINIC_OR_DEPARTMENT_OTHER): Payer: Medicare Other | Admitting: Hematology

## 2022-05-30 VITALS — BP 136/75 | HR 68 | Temp 97.7°F | Resp 18 | Ht 61.0 in | Wt 119.0 lb

## 2022-05-30 DIAGNOSIS — C9000 Multiple myeloma not having achieved remission: Secondary | ICD-10-CM | POA: Diagnosis present

## 2022-05-30 DIAGNOSIS — Z5112 Encounter for antineoplastic immunotherapy: Secondary | ICD-10-CM | POA: Diagnosis present

## 2022-05-30 DIAGNOSIS — Z800 Family history of malignant neoplasm of digestive organs: Secondary | ICD-10-CM | POA: Diagnosis not present

## 2022-05-30 DIAGNOSIS — Z808 Family history of malignant neoplasm of other organs or systems: Secondary | ICD-10-CM | POA: Insufficient documentation

## 2022-05-30 DIAGNOSIS — Z9221 Personal history of antineoplastic chemotherapy: Secondary | ICD-10-CM | POA: Insufficient documentation

## 2022-05-30 DIAGNOSIS — Z85828 Personal history of other malignant neoplasm of skin: Secondary | ICD-10-CM | POA: Insufficient documentation

## 2022-05-30 DIAGNOSIS — M5489 Other dorsalgia: Secondary | ICD-10-CM | POA: Insufficient documentation

## 2022-05-30 DIAGNOSIS — Z79899 Other long term (current) drug therapy: Secondary | ICD-10-CM | POA: Diagnosis not present

## 2022-05-30 LAB — COMPREHENSIVE METABOLIC PANEL
ALT: 25 U/L (ref 0–44)
AST: 23 U/L (ref 15–41)
Albumin: 4.1 g/dL (ref 3.5–5.0)
Alkaline Phosphatase: 59 U/L (ref 38–126)
Anion gap: 7 (ref 5–15)
BUN: 13 mg/dL (ref 8–23)
CO2: 27 mmol/L (ref 22–32)
Calcium: 9.1 mg/dL (ref 8.9–10.3)
Chloride: 101 mmol/L (ref 98–111)
Creatinine, Ser: 0.83 mg/dL (ref 0.44–1.00)
GFR, Estimated: >60 mL/min (ref >60)
Glucose, Bld: 127 mg/dL — ABNORMAL HIGH (ref 70–99)
Potassium: 3.8 mmol/L (ref 3.5–5.1)
Sodium: 135 mmol/L (ref 135–145)
Total Bilirubin: 0.6 mg/dL (ref 0.3–1.2)
Total Protein: 6.6 g/dL (ref 6.5–8.1)

## 2022-05-30 LAB — CBC WITH DIFFERENTIAL/PLATELET
Abs Immature Granulocytes: 0.01 10*3/uL (ref 0.00–0.07)
Basophils Absolute: 0.1 10*3/uL (ref 0.0–0.1)
Basophils Relative: 2 %
Eosinophils Absolute: 0.2 10*3/uL (ref 0.0–0.5)
Eosinophils Relative: 4 %
HCT: 36.3 % (ref 36.0–46.0)
Hemoglobin: 11.5 g/dL — ABNORMAL LOW (ref 12.0–15.0)
Immature Granulocytes: 0 %
Lymphocytes Relative: 22 %
Lymphs Abs: 1.2 10*3/uL (ref 0.7–4.0)
MCH: 27.8 pg (ref 26.0–34.0)
MCHC: 31.7 g/dL (ref 30.0–36.0)
MCV: 87.7 fL (ref 80.0–100.0)
Monocytes Absolute: 0.7 10*3/uL (ref 0.1–1.0)
Monocytes Relative: 12 %
Neutro Abs: 3.3 10*3/uL (ref 1.7–7.7)
Neutrophils Relative %: 60 %
Platelets: 181 10*3/uL (ref 150–400)
RBC: 4.14 MIL/uL (ref 3.87–5.11)
RDW: 17 % — ABNORMAL HIGH (ref 11.5–15.5)
WBC: 5.6 10*3/uL (ref 4.0–10.5)
nRBC: 0 % (ref 0.0–0.2)

## 2022-05-30 LAB — MAGNESIUM: Magnesium: 1.8 mg/dL (ref 1.7–2.4)

## 2022-05-30 MED ORDER — LENALIDOMIDE 10 MG PO CAPS: 10.0000 mg | ORAL_CAPSULE | Freq: Every day | ORAL | 0 refills | Status: DC

## 2022-05-30 MED ORDER — DARATUMUMAB-HYALURONIDASE-FIHJ 1800-30000 MG-UT/15ML ~~LOC~~ SOLN
1800.0000 mg | Freq: Once | SUBCUTANEOUS | Status: AC
  Administered 2022-05-30: 1800 mg via SUBCUTANEOUS
  Filled 2022-05-30: qty 15

## 2022-05-30 MED ORDER — ZOLEDRONIC ACID 4 MG/100ML IV SOLN
4.0000 mg | Freq: Once | INTRAVENOUS | Status: AC
  Administered 2022-05-30: 4 mg via INTRAVENOUS
  Filled 2022-05-30: qty 100

## 2022-05-30 MED ORDER — SODIUM CHLORIDE 0.9 % IV SOLN: Freq: Once | INTRAVENOUS | Status: AC

## 2022-05-30 NOTE — Patient Instructions (Addendum)
[place] at [facility] Discharge Instructions   You were seen and examined today by Dr. Delton Coombes.  He reviewed the results of your lab work which are normal/stable. Your myeloma labs are pending.   We will proceed with your treatment today.   Return as scheduled.    Thank you for choosing [facility] at [facility] to provide your oncology and hematology care.  To afford each patient quality time with our provider, please arrive at least 15 minutes before your scheduled appointment time.   If you have a lab appointment with the [facility] please come in thru the Main Entrance and check in at the main information desk.  You need to re-schedule your appointment should you arrive 10 or more minutes late.  We strive to give you quality time with our providers, and arriving late affects you and other patients whose appointments are after yours.  Also, if you no show three or more times for appointments you may be dismissed from the clinic at the providers discretion.     Again, thank you for choosing [facility].  Our hope is that these requests will decrease the amount of time that you wait before being seen by our physicians.       _____________________________________________________________  Should you have questions after your visit to [facility], please contact our office at [phone] and follow the prompts.  Our office hours are 8:00 a.m. and 4:30 p.m. Monday - Friday.  Please note that voicemails left after 4:00 p.m. may not be returned until the following business day.  We are closed weekends and major holidays.  You do have access to a nurse 24-7, just call the main number to the clinic [phone] and do not press any options, hold on the line and a nurse will answer the phone.    For prescription refill requests, have your pharmacy contact our office and allow 72 hours.    Due to Covid, you will need  to wear a mask upon entering the hospital. If you do not have a mask, a mask will be given to you at the Main Entrance upon arrival. For doctor visits, patients may have 1 support person age 14 or older with them. For treatment visits, patients can not have anyone with them due to social distancing guidelines and our immunocompromised population.

## 2022-05-30 NOTE — Telephone Encounter (Signed)
Chart reviewed. Revlimid refilled per last office note with Dr. Katragadda.  

## 2022-05-30 NOTE — Progress Notes (Signed)
Patient reports taking premedication taken at home prior to arrival per Ronne Binning, RN  Henreitta Leber, PharmD

## 2022-05-30 NOTE — Patient Instructions (Signed)
[facility]  Discharge Instructions: Thank you for choosing [facility] to provide your oncology and hematology care.  If you have a lab appointment with the [facility], please come in thru the Main Entrance and check in at the main information desk.  Wear comfortable clothing and clothing appropriate for easy access to any Portacath or PICC line.   We strive to give you quality time with your provider. You may need to reschedule your appointment if you arrive late (15 or more minutes).  Arriving late affects you and other patients whose appointments are after yours.  Also, if you miss three or more appointments without notifying the office, you may be dismissed from the clinic at the provider's discretion.      For prescription refill requests, have your pharmacy contact our office and allow 72 hours for refills to be completed.    Today you received the following chemotherapy and/or immunotherapy agents Daratumumab and zometa      To help prevent nausea and vomiting after your treatment, we encourage you to take your nausea medication as directed.  BELOW ARE SYMPTOMS THAT SHOULD BE REPORTED IMMEDIATELY: *FEVER GREATER THAN 100.4 F (38 C) OR HIGHER *CHILLS OR SWEATING *NAUSEA AND VOMITING THAT IS NOT CONTROLLED WITH YOUR NAUSEA MEDICATION *UNUSUAL SHORTNESS OF BREATH *UNUSUAL BRUISING OR BLEEDING *URINARY PROBLEMS (pain or burning when urinating, or frequent urination) *BOWEL PROBLEMS (unusual diarrhea, constipation, pain near the anus) TENDERNESS IN MOUTH AND THROAT WITH OR WITHOUT PRESENCE OF ULCERS (sore throat, sores in mouth, or a toothache) UNUSUAL RASH, SWELLING OR PAIN  UNUSUAL VAGINAL DISCHARGE OR ITCHING   Items with * indicate a potential emergency and should be followed up as soon as possible or go to the Emergency Department if any problems should occur.  Please show the CHEMOTHERAPY ALERT CARD or IMMUNOTHERAPY ALERT CARD at check-in  to the Emergency Department and triage nurse.  Should you have questions after your visit or need to cancel or reschedule your appointment, please contact [facility] [phone]  and follow the prompts.  Office hours are 8:00 a.m. to 4:30 p.m. Monday - Friday. Please note that voicemails left after 4:00 p.m. may not be returned until the following business day.  We are closed weekends and major holidays. You have access to a nurse at all times for urgent questions. Please call the main number to the clinic [phone] and follow the prompts.  For any non-urgent questions, you may also contact your provider using MyChart. We now offer e-Visits for anyone 60 and older to request care online for non-urgent symptoms. For details visit [email or web address].   Also download the MyChart app! Go to the app store, search "MyChart", open the app, select [facility], and log in with your MyChart username and password.  Masks are optional in the cancer centers. If you would like for your care team to wear a mask while they are taking care of you, please let them know. You may have one support person who is at least 72 years old accompany you for your appointments.

## 2022-05-30 NOTE — Progress Notes (Signed)
Patient presents today for Daratumumab and Zometa per providers order.  Vital signs and labs within parameters for treatment.    Message received from Anastasio Champion RN/Dr. Delton Coombes patient okay for treatment.  Peripheral IV started and blood return noted pre and post infusion.  Patient took premedications at home prior to appointment.  Stable during administration without incident; injection site WNL; see MAR for injection details.  Patient tolerated procedure well and without incident.   Stable during infusion without adverse affects.  Vital signs stable.  No complaints at this time.  Discharge from clinic via wheelchair in stable condition.  Alert and oriented X 3.  Follow up with [facility] as scheduled.

## 2022-05-30 NOTE — Progress Notes (Signed)
Patient is taking Revlimid as prescribed.  She has not missed any doses and reports no side effects at this time.    Patient has been examined by Dr. Katragadda, and vital signs and labs have been reviewed. ANC, Creatinine, LFTs, hemoglobin, and platelets are within treatment parameters per M.D. - pt may proceed with treatment.  Primary RN and pharmacy notified.  

## 2022-05-30 NOTE — Progress Notes (Signed)
Amanda Conner, [place] [id number]   CLINIC:  Medical Oncology/Hematology  PCP:  Amanda Neu, MD [facility] [place] [address] /* [phone]   REASON FOR VISIT:  Follow-up for multiple myeloma  PRIOR THERAPY: none  NGS Results: not done  CURRENT THERAPY: DaraVRd (Daratumumab SQ) q21d x 6 Cycles (Induction/Consolidation)  BRIEF ONCOLOGIC HISTORY:  Oncology History  Multiple myeloma ([place])  07/17/2021 Initial Diagnosis   Multiple myeloma ([place])   07/26/2021 - 03/15/2022 Chemotherapy   Patient is on Treatment Plan : MYELOMA NEWLY DIAGNOSED TRANSPLANT CANDIDATE DaraVRd (Daratumumab SQ) q21d x 6 Cycles (Induction/Consolidation)     04/17/2022 -  Chemotherapy   Patient is on Treatment Plan : MYELOMA NEWLY DIAGNOSED TRANSPLANT CANDIDATE Daratumumab SQ + Lenalidomide q28d (Maintenance)       CANCER STAGING:  Cancer Staging  Multiple myeloma ([place]) Staging form: Multiple Myeloma, AJCC 6th Edition - Clinical stage from 07/17/2021: Stage IA - Unsigned   INTERVAL HISTORY:  Amanda Conner, a 71 y.o. female, seen for follow-up of multiple myeloma.  She reported left-sided lower back pain which is slightly worse.  She is taking methocarbamol and baclofen.  Dr. Reatha Armour gave her tizanidine but she has not started yet.  Cough and expectoration is stable.  REVIEW OF SYSTEMS:  Review of Systems  Constitutional:  Positive for fatigue.  Gastrointestinal:  Positive for constipation. Negative for abdominal pain.  Musculoskeletal:  Positive for back pain (5/10).  All other systems reviewed and are negative.   PAST MEDICAL/SURGICAL HISTORY:  Past Medical History:  Diagnosis Date   Arthritis    osteoarthritis   Back pain    Bronchiectasis (HCC)    Cancer (HCC)    basal cell nose   Chronic cough    Complication of anesthesia    Dyspnea    Gastrointestinal symptoms    GERD (gastroesophageal reflux disease)     Glaucoma    History of hiatal hernia    Hyperlipemia    Hypertension    Multiple myeloma (HCC)    Osteopenia    Pneumonia    PONV (postoperative nausea and vomiting)    "only one time"   Spondylisthesis    Past Surgical History:  Procedure Laterality Date   ABDOMINAL EXPOSURE N/A 12/29/2018   Procedure: ABDOMINAL EXPOSURE;  Surgeon: Angelia Mould, MD;  Location: [facility];  Service: Vascular;  Laterality: N/A;   ABDOMINOPLASTY  2002   ABDOMINOPLASTY  1970's   ANKLE SURGERY Left 10/2015   ANTERIOR LATERAL LUMBAR FUSION WITH PERCUTANEOUS SCREW 3 LEVEL Left 12/29/2018   Procedure: Lumbar Two to Lumbar Five Anterolateral lumbar interbody fusion;  Surgeon: Erline Levine, MD;  Location: [facility];  Service: Neurosurgery;  Laterality: Left;  anterolateral   ANTERIOR LUMBAR FUSION N/A 12/29/2018   Procedure: Lumbar Five Sacral One Anterior lumbar interbody fusion;  Surgeon: Erline Levine, MD;  Location: [facility];  Service: Neurosurgery;  Laterality: N/A;  anterior approach   BASAL CELL CARCINOMA EXCISION  06/2018   nose   BLEPHAROPLASTY Bilateral 1999   BREAST ENHANCEMENT SURGERY  1970's   COLONOSCOPY     CYSTOURETHROSCOPY  2018   [place] J ureteal stents   DECOMPRESSION CORE HIP Left 2014   FACIAL COSMETIC SURGERY  2004   FLUOROSCOPY GUIDANCE     HIP ARTHROPLASTY Left    INCISIONAL HERNIA REPAIR N/A 10/08/2019   Procedure: OPEN INCISIONAL HERNIA REPAIR WITH MESH;  Surgeon: Armandina Gemma, MD;  Location: [facility];  Service: General;  Laterality: N/A;   JOINT REPLACEMENT Left    Hip; 05/2014   KYPHOPLASTY N/A 08/31/2021   Procedure: KYPHOPLASTY THORACIC ELEVEN, THORACIC TWELVE;  Surgeon: Dawley, Theodoro Doing, DO;  Location: [facility];  Service: Neurosurgery;  Laterality: N/A;   LAPAROSCOPIC PARTIAL COLECTOMY  06/26/2017   LUMBAR PERCUTANEOUS PEDICLE SCREW 4 LEVEL N/A 12/29/2018   Procedure: Lumbar Percutaneous Pedicle Screw Placement Lumbar two-Sacral one;  Surgeon: Erline Levine, MD;  Location: [facility];   Service: Neurosurgery;  Laterality: N/A;   MOHS SURGERY  2019   nose   PARTIAL COLECTOMY  06/2017   for diverticulitis   REMOVAL OF BILATERAL TISSUE EXPANDERS WITH PLACEMENT OF BILATERAL BREAST IMPLANTS  2004   THIGH LIFT  1994   TOE FUSION Right    great toe   TONSILLECTOMY     UMBILICAL HERNIA REPAIR     with tummy tuck   UMBILICAL HERNIA REPAIR  2002    SOCIAL HISTORY:  Social History   Socioeconomic History   Marital status: Married    Spouse name: Not on file   Number of children: Not on file   Years of education: Not on file   Highest education level: Not on file  Occupational History   Not on file  Tobacco Use   Smoking status: Never   Smokeless tobacco: Never  Vaping Use   Vaping Use: Never used  Substance and Sexual Activity   Alcohol use: Yes    Comment: occasional   Drug use: Never   Sexual activity: Not on file  Other Topics Concern   Not on file  Social History Narrative   Not on file   Social Determinants of Health   Financial Resource Strain: Low Risk  (07/24/2021)   Overall Financial Resource Strain (CARDIA)    Difficulty of Paying Living Expenses: Not hard at all  Food Insecurity: No Food Insecurity (07/24/2021)   Hunger Vital Sign    Worried About Running Out of Food in the Last Year: Never true    [place] in the Last Year: Never true  Transportation Needs: No Transportation Needs (07/24/2021)   PRAPARE - Hydrologist (Medical): No    Lack of Transportation (Non-Medical): No  Physical Activity: Not on file  Stress: Not on file  Social Connections: Socially Integrated (07/24/2021)   Social Connection and Isolation Panel [NHANES]    Frequency of Communication with Friends and Family: More than three times a week    Frequency of Social Gatherings with Friends and Family: More than three times a week    Attends Religious Services: 1 to 4 times per year    Active Member of [employer] or Organizations: No    Attends  Music therapist: 1 to 4 times per year    Marital Status: Married  Human resources officer Violence: Not on file    FAMILY HISTORY:  Family History  Problem Relation Age of Onset   Hypertension Mother    COPD Mother    Hypertension Father    Heart disease Father     CURRENT MEDICATIONS:  Current Outpatient Medications  Medication Sig Dispense Refill   albuterol (VENTOLIN HFA) 108 (90 Base) MCG/ACT inhaler Inhale 2 puffs into the lungs every 6 (six) hours as needed for wheezing or shortness of breath.     aspirin EC 81 MG tablet Take 81 mg by mouth daily. Swallow whole.     Baclofen 5  MG TABS Take 1 tablet by mouth 3 (three) times daily as needed.     benzonatate (TESSALON) 100 MG capsule Take 100 mg by mouth 3 (three) times daily as needed for cough.     Biotin 1000 MCG tablet Take 1,000 mcg by mouth daily.     calcitonin, salmon, (MIACALCIN/FORTICAL) 200 UNIT/ACT nasal spray Place 1 spray into alternate nostrils daily.     Calcium Carb-Cholecalciferol 600-800 MG-UNIT TABS Take 1 tablet by mouth daily.     daratumumab-hyaluronidase-fihj (DARZALEX FASPRO) 1800-30000 MG-UT/15ML SOLN Inject 1,800 mg into the skin once a week.     dexamethasone (DECADRON) 2 MG tablet Take 5 tablets (10 mg total) by mouth every 30 (thirty) days. 20 tablet 4   dextromethorphan-guaiFENesin (MUCINEX DM) 30-600 MG 12hr tablet Take 1 tablet by mouth daily.      estrogen, conjugated,-medroxyprogesterone (PREMPRO) 0.45-1.5 MG tablet Take 1 tablet by mouth daily.     feeding supplement (ENSURE ENLIVE / ENSURE PLUS) LIQD Take 237 mLs by mouth 2 (two) times daily between meals. (Patient taking differently: Take 237 mLs by mouth daily.) 237 mL 12   fluticasone (FLONASE) 50 MCG/ACT nasal spray Place 1 spray into both nostrils daily.     furosemide (LASIX) 20 MG tablet TAKE 1 TABLET BY MOUTH EVERY DAY AS NEEDED 90 tablet 3   hydrochlorothiazide (HYDRODIURIL) 25 MG tablet Take 25 mg by mouth daily as needed  (swelling).     HYDROcodone bit-homatropine (HYCODAN) 5-1.5 MG/5ML syrup Take 5 mLs by mouth every 6 (six) hours as needed for cough. 473 mL 0   Lactulose 20 GM/30ML SOLN Take 30 ml by mouth every 3 hours until bowel movement is had, then take 30 ml daily (Patient taking differently: Take 20 g by mouth daily as needed (constipation).) 946 mL 3   latanoprost (XALATAN) 0.005 % ophthalmic solution Place 1 drop into both eyes at bedtime.      lenalidomide (REVLIMID) 10 MG capsule Take 1 capsule (10 mg total) by mouth daily. 21 days on, 7 days off 21 capsule 0   linaclotide (LINZESS) 145 MCG CAPS capsule Take 145 mcg by mouth daily as needed (constipation).     lisinopril (ZESTRIL) 20 MG tablet Take 20 mg by mouth daily.     loratadine (CLARITIN) 10 MG tablet Take 10 mg by mouth daily.     methocarbamol (ROBAXIN) 500 MG tablet Take 1 tablet (500 mg total) by mouth every 6 (six) hours as needed for muscle spasms. 40 tablet 3   omeprazole (PRILOSEC) 20 MG capsule Take 20 mg by mouth daily.     ondansetron (ZOFRAN ODT) 4 MG disintegrating tablet Take 1 tablet (4 mg total) by mouth every 4 (four) hours as needed for nausea or vomiting. 20 tablet 6   Oxycodone HCl 10 MG TABS Take 1 tablet (10 mg total) by mouth every 6 (six) hours as needed. 120 tablet 0   pantoprazole (PROTONIX) 40 MG tablet Take 40 mg by mouth daily.     sucralfate (CARAFATE) 1 g tablet Take 1 g by mouth 4 (four) times daily.     SUMAtriptan (IMITREX) 100 MG tablet Take 100 mg by mouth every 2 (two) hours as needed for migraine. May repeat in 2 hours if headache persists or recurs.     UNABLE TO FIND Place 0.5 g into both eyes 3 (three) times daily. Med Name: Jearld Pies (otepredol)     No current facility-administered medications for this visit.    ALLERGIES:  Allergies  Allergen Reactions   Tobramycin-Dexamethasone Other (See Comments)    Itching and swelling   Ethambutol Other (See Comments)    Fever   Neomycin-Polymyxin-Dexameth  Itching and Swelling   Amikacin Other (See Comments)    Eosinophilia with chills and aching when given with cefoxitin   Biaxin [Clarithromycin] Itching   Cefoxitin Other (See Comments)    Eosinophlia when given with amikacin   Sulfamethoxazole-Trimethoprim Itching   Vancomycin     "Got red splotches on skin after several doses"   Bacitracin-Polymyxin B Rash    PHYSICAL EXAM:  Performance status (ECOG): 1 - Symptomatic but completely ambulatory  There were no vitals filed for this visit.  Wt Readings from Last 3 Encounters:  04/17/22 124 lb 1.6 oz (56.3 kg)  03/20/22 126 lb 3.2 oz (57.2 kg)  03/15/22 126 lb 9.6 oz (57.4 kg)   Physical Exam Vitals reviewed.  Constitutional:      Appearance: Normal appearance.     Comments: In wheelchair  Cardiovascular:     Rate and Rhythm: Normal rate and regular rhythm.     Pulses: Normal pulses.     Heart sounds: Normal heart sounds.  Pulmonary:     Effort: Pulmonary effort is normal.     Breath sounds: Normal breath sounds.  Neurological:     General: No focal deficit present.     Mental Status: She is alert and oriented to person, place, and time.  Psychiatric:        Mood and Affect: Mood normal.        Behavior: Behavior normal.    LABORATORY DATA:  I have reviewed the labs as listed.     Latest Ref Rng & Units 04/17/2022    9:52 AM 03/15/2022   12:11 PM 03/08/2022   12:06 PM  CBC  WBC 4.0 - 10.5 K/uL 4.6  7.0  6.2   Hemoglobin 12.0 - 15.0 g/dL 12.3  11.4  10.9   Hematocrit 36.0 - 46.0 % 39.0  35.3  34.2   Platelets 150 - 400 K/uL 252  233  288       Latest Ref Rng & Units 04/17/2022    9:52 AM 03/15/2022   12:11 PM 03/08/2022   12:06 PM  CMP  Glucose 70 - 99 mg/dL 82  98  100   BUN 8 - 23 mg/dL _0 Creatinine 0.44 - 1.00 mg/dL 0.77  0.76  0.72   Sodium 135 - 145 mmol/L 136  136  135   Potassium 3.5 - 5.1 mmol/L 3.9  4.2  4.4   Chloride 98 - 111 mmol/L 102  102  101   CO2 22 - 32 mmol/L _1 Calcium  8.9 - 10.3 mg/dL 9.0  8.7  8.9   Total Protein 6.5 - 8.1 g/dL 7.2  6.6  6.6   Total Bilirubin 0.3 - 1.2 mg/dL 0.5  0.5  0.7   Alkaline Phos 38 - 126 U/L 58  61  60   AST 15 - 41 U/L _2 ALT 0 - 44 U/L _3 DIAGNOSTIC IMAGING:  I have independently reviewed the scans and discussed with the patient. No results found.   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 Wilburn Mylar - She reported discomfort in the left posterior back for  the last 1 month.  She was seen by Dr. Chauncey Reading and x-rays showed abnormality.  A CT scan of the chest was done on 05/24/2021. - CT chest report reviewed by me from 05/24/2021 showed expansile oval-shaped lesion within the posterior aspect of the seventh rib with a nodule measuring 2.1 x 1.1 cm.  Adjacent cortex demonstrates area of thinning and destruction.  Stable emphysematous and interstitial changes within the lungs.  Stable pulmonary nodules within the right upper lobe and left upper lobe when compared to CT from September 2020. - CT of the abdomen and pelvis did not show any significant abnormalities. - She denies any fevers, night sweats or weight loss in the last 6 months.  She had history of basal cell carcinoma on the nose removed by Mohs surgery. - She also reported history of osteoporosis and broke left upper rib 1 year ago after sneezing. -Reviewed PET scan from 06/14/2021.  Mild hypermetabolism involving left aspect of the L5 vertebral body and also pedicle region surrounding the screw.  SUV 4.2.  There appears to be a lytic process on the CT scan.  The lytic left posterior seventh rib lesion is mildly hypermetabolic with SUV 5.17.  No other definite lytic hypermetabolic bone lesions seen. - We have reviewed left seventh rib bone biopsy from 06/25/2021 consistent with plasma cell neoplasm.  Cells are positive for CD138, CD56 and a kappa restricted.  - Labs on 06/13/2021 M spike  1.7 g.  Immunofixation IgA kappa.  Free light chain shows kappa light chain 71, ratio 8.22.  LDH and beta-2 microglobulin was normal. - Bone marrow biopsy on 07/04/2021 consistent with plasma cell neoplasm with 26% plasma cells on aspirate with atypical features and 50 to 60% on core biopsy.  Chromosome analysis was normal.  Multiple myeloma FISH panel shows loss of NF1/chromosome 17/17 q.  Gain of CCN D1 or chromosome 11/11 q.  Gain of chromosome 4/4P.  Loss of material from the long-arm of chromosome 13.  These are all standard risk features. - 24-hour urine total protein to 40 mg.  Urine immunofixation was negative.  Urine kappa light chains were 4.13. - 8 cycles of Dara RVD from 07/26/2021 through 03/01/2022 - T11 and T12 vertebroplasty by Dr. Reatha Armour on 08/31/2021. - She has decided against bone marrow transplant after the advice of Dr. Lorenda Cahill.   Social/family history: - She is married and lives at home with her husband.  She has not worked but had help her husband and his work.  She is a non-smoker. - Maternal grandmother had colon cancer.  Mother had a squamous cell carcinoma of the skin.   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  Free light chain ratio is normal. - She is continuing Revlimid 10 mg 3 weeks on/1 week of reasonably well.  We have sent myeloma panel from today. - Labs reviewed from today shows normal chemistries and CBC. - Proceed with Darzalex injection today.  RTC 4 weeks for follow-up.  If she has sustained absence of M spike, will consider discontinuing Darzalex.  2.  Mid back pain (s/p vertebroplasty of T11 and T12 by Dr. Reatha Armour on 08/31/2021): - She reports her pain is slightly worse in the left lower back.  Dr. Reatha Armour has recommended injection in the sacroiliac region.  It is okay with me. - Continue methocarbamol/baclofen/tizanidine.  3.  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 [facility]da Cahill at [facility].  No new recommendations.  4.  Myeloma bone disease: - Calcium is normal today.  Proceed with Zometa every 4 weeks.  5.  ID prophylaxis: - Continue Valtrex daily.   Orders placed this encounter:  No orders of the defined types were placed in this encounter.     Derek Jack, MD [place] [phone]

## 2022-05-31 LAB — KAPPA/LAMBDA LIGHT CHAINS
Kappa free light chain: 17.4 mg/L (ref 3.3–19.4)
Kappa, lambda light chain ratio: 2.12 — ABNORMAL HIGH (ref 0.26–1.65)
Lambda free light chains: 8.2 mg/L (ref 5.7–26.3)

## 2022-06-02 ENCOUNTER — Emergency Department (HOSPITAL_COMMUNITY): Payer: Medicare Other

## 2022-06-02 ENCOUNTER — Other Ambulatory Visit: Payer: Self-pay

## 2022-06-02 ENCOUNTER — Emergency Department (HOSPITAL_COMMUNITY)
Admission: EM | Admit: 2022-06-02 | Discharge: 2022-06-02 | Disposition: A | Discharge status: Home / Self Care | Payer: Medicare Other | Attending: Emergency Medicine | Admitting: Emergency Medicine

## 2022-06-02 ENCOUNTER — Encounter (HOSPITAL_COMMUNITY): Payer: Self-pay

## 2022-06-02 DIAGNOSIS — Z1152 Encounter for screening for COVID-19: Secondary | ICD-10-CM | POA: Diagnosis not present

## 2022-06-02 DIAGNOSIS — R059 Cough, unspecified: Secondary | ICD-10-CM | POA: Diagnosis present

## 2022-06-02 DIAGNOSIS — Z7982 Long term (current) use of aspirin: Secondary | ICD-10-CM | POA: Insufficient documentation

## 2022-06-02 DIAGNOSIS — Z79899 Other long term (current) drug therapy: Secondary | ICD-10-CM | POA: Insufficient documentation

## 2022-06-02 DIAGNOSIS — R0602 Shortness of breath: Secondary | ICD-10-CM | POA: Diagnosis not present

## 2022-06-02 DIAGNOSIS — U071 COVID-19: Secondary | ICD-10-CM

## 2022-06-02 DIAGNOSIS — I10 Essential (primary) hypertension: Secondary | ICD-10-CM | POA: Diagnosis not present

## 2022-06-02 LAB — CBC WITH DIFFERENTIAL/PLATELET
Abs Immature Granulocytes: 0.03 10*3/uL (ref 0.00–0.07)
Basophils Absolute: 0.1 10*3/uL (ref 0.0–0.1)
Basophils Relative: 1 %
Eosinophils Absolute: 0.3 10*3/uL (ref 0.0–0.5)
Eosinophils Relative: 3 %
HCT: 36.2 % (ref 36.0–46.0)
Hemoglobin: 11.6 g/dL — ABNORMAL LOW (ref 12.0–15.0)
Immature Granulocytes: 0 %
Lymphocytes Relative: 21 %
Lymphs Abs: 1.8 10*3/uL (ref 0.7–4.0)
MCH: 28.1 pg (ref 26.0–34.0)
MCHC: 32 g/dL (ref 30.0–36.0)
MCV: 87.7 fL (ref 80.0–100.0)
Monocytes Absolute: 0.9 10*3/uL (ref 0.1–1.0)
Monocytes Relative: 10 %
Neutro Abs: 5.5 10*3/uL (ref 1.7–7.7)
Neutrophils Relative %: 65 %
Platelets: 167 10*3/uL (ref 150–400)
RBC: 4.13 MIL/uL (ref 3.87–5.11)
RDW: 17 % — ABNORMAL HIGH (ref 11.5–15.5)
WBC: 8.5 10*3/uL (ref 4.0–10.5)
nRBC: 0 % (ref 0.0–0.2)

## 2022-06-02 LAB — BASIC METABOLIC PANEL
Anion gap: 7 (ref 5–15)
BUN: 11 mg/dL (ref 8–23)
CO2: 27 mmol/L (ref 22–32)
Calcium: 9.4 mg/dL (ref 8.9–10.3)
Chloride: 100 mmol/L (ref 98–111)
Creatinine, Ser: 0.81 mg/dL (ref 0.44–1.00)
GFR, Estimated: >60 mL/min (ref >60)
Glucose, Bld: 100 mg/dL — ABNORMAL HIGH (ref 70–99)
Potassium: 4.5 mmol/L (ref 3.5–5.1)
Sodium: 134 mmol/L — ABNORMAL LOW (ref 135–145)

## 2022-06-02 LAB — TROPONIN I (HIGH SENSITIVITY)
Troponin I (High Sensitivity): 15 ng/L (ref <18)
Troponin I (High Sensitivity): 21 ng/L — ABNORMAL HIGH (ref <18)

## 2022-06-02 LAB — RESP PANEL BY RT-PCR (FLU A&B, COVID) ARPGX2
Influenza A by PCR: NEGATIVE
Influenza B by PCR: NEGATIVE
SARS Coronavirus 2 by RT PCR: NEGATIVE

## 2022-06-02 MED ORDER — MOLNUPIRAVIR EUA 200MG CAPSULE: 4.0000 | ORAL_CAPSULE | Freq: Two times a day (BID) | ORAL | 0 refills | Status: AC

## 2022-06-02 NOTE — ED Notes (Signed)
Date and time results received: 06/02/22 1847 (use smartphrase ".now" to insert current time)  Test: troponin  Critical Value: 21  Name of Provider Notified: Sherrill Raring PA  Orders Received? Or Actions Taken?:  None given at this time, Per PA Dr Langston Masker to assess patient.

## 2022-06-02 NOTE — Discharge Instructions (Addendum)
Your home covid test was positive so although the test today in the hospital was negative we would like to be cautious and treat with the antiviral medicine. Take as prescribed for 5 days.  Your cardiac enzymes were slightly elevated today.  Your troponin was 21 which is a slightly increased and can be a marker of strain on the heart.  We think this is most likely due to your body fighting of this viral process.  Please call your cardiologist tomorrow for reevaluation.  If you have any chest pain, tightness, worsening symptoms you should return to the ED for additional evaluation.  We recommend you quarantine for 10 days from the start of your symptoms to prevent the spread of infection to others

## 2022-06-02 NOTE — ED Triage Notes (Signed)
Pt presents with cough, sore throat, generalized aches that started yesterday. Pt had a home COVID test that was positive yesterday. Pt has a hx of multiple myeloma and is on treatment for that. Pt's CA MD advised pt to be started on Paxlovid.

## 2022-06-02 NOTE — ED Provider Notes (Signed)
[facility] EMERGENCY DEPARTMENT Provider Note   CSN: [id number] Arrival date & time: 06/02/22  1449     History {Add pertinent medical, surgical, social history, OB history to HPI:1} No chief complaint on file.   Amanda Conner is a 71 y.o. female.  HPI   Patient with medical history of hypertension, chronic cough, hyperlipidemia, multiple myeloma under maintenance chemotherapy presents today due to viral symptoms.  Started feeling bad yesterday and worsening worsening cough which is productive, rhinorrhea, feeling slightly short of breath and having some chest pressure which comes and goes. Not painful, no radiation. Denies any nausea or vomiting or dysuria.  She took a home COVID test which was positive.  Called oncology line and triage RN advised ED for workup and possible paxlovid.   Home Medications Prior to Admission medications   Medication Sig Start Date End Date Taking? Authorizing Provider  albuterol (VENTOLIN HFA) 108 (90 Base) MCG/ACT inhaler Inhale 2 puffs into the lungs every 6 (six) hours as needed for wheezing or shortness of breath.    [provider]  aspirin EC 81 MG tablet Take 81 mg by mouth daily. Swallow whole.    [provider]  Baclofen 5 MG TABS Take 1 tablet by mouth 3 (three) times daily as needed. 10/08/21   [provider]  benzonatate (TESSALON) 100 MG capsule Take 100 mg by mouth 3 (three) times daily as needed for cough.    [provider]  Biotin 1000 MCG tablet Take 1,000 mcg by mouth daily.    [provider]  calcitonin, salmon, (MIACALCIN/FORTICAL) 200 UNIT/ACT nasal spray Place 1 spray into alternate nostrils daily. 07/19/21   [provider]  Calcium Carb-Cholecalciferol 600-800 MG-UNIT TABS Take 1 tablet by mouth daily.    [provider]  daratumumab-hyaluronidase-fihj (DARZALEX FASPRO) 1800-30000 MG-UT/15ML SOLN Inject 1,800 mg into the skin once a week. 07/26/21   [provider]  dexamethasone (DECADRON) 2 MG tablet Take 5 tablets (10 mg total) by mouth every 30 (thirty) days. 03/20/22   Derek Jack, MD  dextromethorphan-guaiFENesin [facility] DM) 30-600 MG 12hr tablet Take 1 tablet by mouth daily.     [provider]  estrogen, conjugated,-medroxyprogesterone (PREMPRO) 0.45-1.5 MG tablet Take 1 tablet by mouth daily.    [provider]  feeding supplement (ENSURE ENLIVE / ENSURE PLUS) LIQD Take 237 mLs by mouth 2 (two) times daily between meals. Patient taking differently: Take 237 mLs by mouth daily. 08/09/21   Manuella Ghazi, Pratik D, DO  fluticasone (FLONASE) 50 MCG/ACT nasal spray Place 1 spray into both nostrils daily.    [provider]  furosemide (LASIX) 20 MG tablet TAKE 1 TABLET BY MOUTH EVERY DAY AS NEEDED 12/31/21   Derek Jack, MD  hydrochlorothiazide (HYDRODIURIL) 25 MG tablet Take 25 mg by mouth daily as needed (swelling).    [provider]  HYDROcodone bit-homatropine (HYCODAN) 5-1.5 MG/5ML syrup Take 5 mLs by mouth every 6 (six) hours as needed for cough. 10/17/21   Derek Jack, MD  Lactulose 20 GM/30ML SOLN Take 30 ml by mouth every 3 hours until bowel movement is had, then take 30 ml daily Patient taking differently: Take 20 g by mouth daily as needed (constipation). 07/26/21   Derek Jack, MD  latanoprost (XALATAN) 0.005 % ophthalmic solution Place 1 drop into both eyes at bedtime.     [provider]  lenalidomide (REVLIMID) 10 MG capsule Take 1 capsule (10 mg total) by mouth daily. 21 days on,  7 days off 05/30/22   Derek Jack, MD  linaclotide [facility]) 145 MCG CAPS capsule Take 145 mcg by mouth daily as needed (constipation).    [provider]  lisinopril (ZESTRIL) 20 MG tablet Take 20 mg by mouth daily.    [provider]  loratadine (CLARITIN) 10 MG tablet Take 10 mg by mouth daily.    [provider]  methocarbamol (ROBAXIN) 500 MG  tablet Take 1 tablet (500 mg total) by mouth every 6 (six) hours as needed for muscle spasms. 01/05/19   Kristeen Miss, MD  omeprazole (PRILOSEC) 20 MG capsule Take 20 mg by mouth daily.    [provider]  ondansetron (ZOFRAN ODT) 4 MG disintegrating tablet Take 1 tablet (4 mg total) by mouth every 4 (four) hours as needed for nausea or vomiting. 07/17/21   Derek Jack, MD  Oxycodone HCl 10 MG TABS Take 1 tablet (10 mg total) by mouth every 6 (six) hours as needed. 08/22/21   Derek Jack, MD  pantoprazole (PROTONIX) 40 MG tablet Take 40 mg by mouth daily.    [provider]  sucralfate (CARAFATE) 1 g tablet Take 1 g by mouth 4 (four) times daily. 12/04/21   [provider]  SUMAtriptan (IMITREX) 100 MG tablet Take 100 mg by mouth every 2 (two) hours as needed for migraine. May repeat in 2 hours if headache persists or recurs.    [provider]  UNABLE TO FIND Place 0.5 g into both eyes 3 (three) times daily. Med Name: Loteman (otepredol)    [provider]      Allergies    Tobramycin-dexamethasone, Ethambutol, Neomycin-polymyxin-dexameth, Amikacin, Biaxin [clarithromycin], Cefoxitin, Sulfamethoxazole-trimethoprim, Vancomycin, and Bacitracin-polymyxin b    Review of Systems   Review of Systems  Physical Exam Updated Vital Signs BP (!) 151/75 (BP Location: Left Arm)   Pulse 78   Temp 98.2 F (36.8 C) (Oral)   Resp 20   Ht _0  (1.575 m)   Wt 53.1 kg   SpO2 96%   BMI 21.40 kg/m  Physical Exam Vitals and nursing note reviewed. Exam conducted with a chaperone present.  Constitutional:      Appearance: Normal appearance.  HENT:     Head: Normocephalic and atraumatic.  Eyes:     General: No scleral icterus.       Right eye: No discharge.        Left eye: No discharge.     Extraocular Movements: Extraocular movements intact.     Pupils: Pupils are equal, round, and reactive to light.  Cardiovascular:     Rate and Rhythm:  Normal rate and regular rhythm.     Pulses: Normal pulses.     Heart sounds: Normal heart sounds. No murmur heard.    No friction rub. No gallop.  Pulmonary:     Effort: Pulmonary effort is normal. No respiratory distress.     Comments: Slightly diminished lung sounds bilaterally but no tachypnea Abdominal:     General: Abdomen is flat. Bowel sounds are normal. There is no distension.     Palpations: Abdomen is soft.     Tenderness: There is no abdominal tenderness.  Skin:    General: Skin is warm and dry.     Coloration: Skin is not jaundiced.  Neurological:     Mental Status: She is alert. Mental status is at baseline.     Coordination: Coordination normal.     ED Results / Procedures / Treatments   Labs (  all labs ordered are listed, but only abnormal results are displayed) Labs Reviewed  RESP PANEL BY RT-PCR (FLU A&B, COVID) ARPGX2  CBC WITH DIFFERENTIAL/PLATELET  BASIC METABOLIC PANEL  TROPONIN I (HIGH SENSITIVITY)    EKG None  Radiology No results found.  Procedures Procedures  {Document cardiac monitor, telemetry assessment procedure when appropriate:1}  Medications Ordered in ED Medications - No data to display  ED Course/ Medical Decision Making/ A&P                           Medical Decision Making Amount and/or Complexity of Data Reviewed Labs: ordered. Radiology: ordered.   This is a 71 year old female presenting today due to viral symptoms, shortness of breath and chest tightness starting 1 day ago.  Differential includes not limited to ACS, pneumonia, viral URI, COVID, PE, arrhythmia.  Patient does not meet any SIRS criteria, doubt sepsis.   Patient's husband is independent story.  Also viewed external medical record including oncologic history.  I ordered, viewed and interpreted laboratory work-up. CBC without leukocytosis, mild anemia hemoglobin 11.6. BMP without gross electrolyte derangement or AKI. Troponin 15-->21.  Relatively flat.  Viral  panel is actually negative for COVID and flu.  Chest x-ray negative for acute process or pneumonia.  Patient is on cardiac monitoring in sinus rhythm.  I ordered EKG which shows sinus rhythm without EKG changes.  Case discussed with my attending who advices proceed with antiviral therapy given positive home test at home and presentation is consistent - concern our test was falsely positive and she is higher risk for complication. Discussed with patient this plan and she is in agreement.   Considered ACS but clinically this does seem cardiac base don HPI. Additionally there are specific ischemic changes on EKG and troponins are roughly flat with only a delta of 7.  Considered admission for troponin trending but presentation. I suspect this is more likely COVID and viral rather than ACS.  Discussed with attending who agrees.  I reevaluated the patient.  She denies any current chest tightness or chest pain.   Patient updated on plan.  Strict return precautions discussed. She will follow up with cardiologist regarding abnormal troponin.   {Document critical care time when appropriate:1} {Document review of labs and clinical decision tools ie heart score, Chads2Vasc2 etc:1}  {Document your independent review of radiology images, and any outside records:1} {Document your discussion with family members, caretakers, and with consultants:1} {Document social determinants of health affecting pt's care:1} {Document your decision making why or why not admission, treatments were needed:1} Final Clinical Impression(s) / ED Diagnoses Final diagnoses:  None    Rx / DC Orders ED Discharge Orders     None

## 2022-06-03 LAB — PROTEIN ELECTROPHORESIS, SERUM
A/G Ratio: 1.6 (ref 0.7–1.7)
Albumin ELP: 3.5 g/dL (ref 2.9–4.4)
Alpha-1-Globulin: 0.3 g/dL (ref 0.0–0.4)
Alpha-2-Globulin: 0.6 g/dL (ref 0.4–1.0)
Beta Globulin: 0.8 g/dL (ref 0.7–1.3)
Gamma Globulin: 0.5 g/dL (ref 0.4–1.8)
Globulin, Total: 2.2 g/dL (ref 2.2–3.9)
M-Spike, %: 0.1 g/dL — ABNORMAL HIGH
Total Protein ELP: 5.7 g/dL — ABNORMAL LOW (ref 6.0–8.5)

## 2022-06-03 LAB — IMMUNOFIXATION ELECTROPHORESIS
IgA: 94 mg/dL (ref 64–422)
IgG (Immunoglobin G), Serum: 552 mg/dL — ABNORMAL LOW (ref 586–1602)
IgM (Immunoglobulin M), Srm: 36 mg/dL (ref 26–217)
Total Protein ELP: 5.5 g/dL — ABNORMAL LOW (ref 6.0–8.5)

## 2022-06-13 ENCOUNTER — Telehealth: Payer: Self-pay | Admitting: *Deleted

## 2022-06-13 NOTE — Telephone Encounter (Addendum)
Received call from husband stating that she is having severe pain in her right hand & wrist that has been present for a few days and denies injury.  Has an appointment with orthopedic this afternoon.  States that oxycodone has not given adequate relief.  Suggested that she alternate with hydrocodone, as she has only tried oxy and applied ice.  Advised that they call after the appointment this afternoon to update [place] on findings.  Verbalized understanding.  06/13/22 @ 16:19 - Per husband, she went to see orthopedic physician and films were negative for lesions or fracture.  She had been using that side for support with her cane and it was thought that she had developed tendonitis, per husband's understanding.  She was given a splint and prescribed a medrol dose pack.

## 2022-06-26 ENCOUNTER — Other Ambulatory Visit: Payer: Self-pay

## 2022-06-26 ENCOUNTER — Inpatient Hospital Stay: Payer: Medicare Other

## 2022-06-26 ENCOUNTER — Inpatient Hospital Stay (HOSPITAL_BASED_OUTPATIENT_CLINIC_OR_DEPARTMENT_OTHER): Payer: Medicare Other | Admitting: Hematology

## 2022-06-26 ENCOUNTER — Inpatient Hospital Stay: Payer: Medicare Other | Attending: Hematology

## 2022-06-26 VITALS — BP 133/70 | HR 63 | Temp 97.8°F | Resp 18 | Wt 115.5 lb

## 2022-06-26 DIAGNOSIS — C9000 Multiple myeloma not having achieved remission: Secondary | ICD-10-CM

## 2022-06-26 DIAGNOSIS — R059 Cough, unspecified: Secondary | ICD-10-CM | POA: Diagnosis not present

## 2022-06-26 DIAGNOSIS — R197 Diarrhea, unspecified: Secondary | ICD-10-CM | POA: Insufficient documentation

## 2022-06-26 DIAGNOSIS — Z808 Family history of malignant neoplasm of other organs or systems: Secondary | ICD-10-CM | POA: Insufficient documentation

## 2022-06-26 DIAGNOSIS — Z800 Family history of malignant neoplasm of digestive organs: Secondary | ICD-10-CM | POA: Insufficient documentation

## 2022-06-26 DIAGNOSIS — A319 Mycobacterial infection, unspecified: Secondary | ICD-10-CM | POA: Diagnosis not present

## 2022-06-26 DIAGNOSIS — Z5112 Encounter for antineoplastic immunotherapy: Secondary | ICD-10-CM | POA: Insufficient documentation

## 2022-06-26 DIAGNOSIS — Z9221 Personal history of antineoplastic chemotherapy: Secondary | ICD-10-CM | POA: Diagnosis not present

## 2022-06-26 DIAGNOSIS — K5900 Constipation, unspecified: Secondary | ICD-10-CM | POA: Insufficient documentation

## 2022-06-26 DIAGNOSIS — M5489 Other dorsalgia: Secondary | ICD-10-CM | POA: Diagnosis not present

## 2022-06-26 DIAGNOSIS — R0602 Shortness of breath: Secondary | ICD-10-CM | POA: Diagnosis not present

## 2022-06-26 DIAGNOSIS — Z79899 Other long term (current) drug therapy: Secondary | ICD-10-CM | POA: Insufficient documentation

## 2022-06-26 DIAGNOSIS — Z85828 Personal history of other malignant neoplasm of skin: Secondary | ICD-10-CM | POA: Diagnosis not present

## 2022-06-26 LAB — COMPREHENSIVE METABOLIC PANEL
ALT: 22 U/L (ref 0–44)
AST: 22 U/L (ref 15–41)
Albumin: 4.4 g/dL (ref 3.5–5.0)
Alkaline Phosphatase: 62 U/L (ref 38–126)
Anion gap: 7 (ref 5–15)
BUN: 16 mg/dL (ref 8–23)
CO2: 27 mmol/L (ref 22–32)
Calcium: 9 mg/dL (ref 8.9–10.3)
Chloride: 102 mmol/L (ref 98–111)
Creatinine, Ser: 0.74 mg/dL (ref 0.44–1.00)
GFR, Estimated: >60 mL/min (ref >60)
Glucose, Bld: 108 mg/dL — ABNORMAL HIGH (ref 70–99)
Potassium: 4.6 mmol/L (ref 3.5–5.1)
Sodium: 136 mmol/L (ref 135–145)
Total Bilirubin: 0.6 mg/dL (ref 0.3–1.2)
Total Protein: 7.2 g/dL (ref 6.5–8.1)

## 2022-06-26 LAB — CBC WITH DIFFERENTIAL/PLATELET
Abs Immature Granulocytes: 0.02 10*3/uL (ref 0.00–0.07)
Basophils Absolute: 0.1 10*3/uL (ref 0.0–0.1)
Basophils Relative: 1 %
Eosinophils Absolute: 0.2 10*3/uL (ref 0.0–0.5)
Eosinophils Relative: 3 %
HCT: 36.7 % (ref 36.0–46.0)
Hemoglobin: 11.7 g/dL — ABNORMAL LOW (ref 12.0–15.0)
Immature Granulocytes: 0 %
Lymphocytes Relative: 19 %
Lymphs Abs: 1.5 10*3/uL (ref 0.7–4.0)
MCH: 27.9 pg (ref 26.0–34.0)
MCHC: 31.9 g/dL (ref 30.0–36.0)
MCV: 87.4 fL (ref 80.0–100.0)
Monocytes Absolute: 1 10*3/uL (ref 0.1–1.0)
Monocytes Relative: 13 %
Neutro Abs: 4.9 10*3/uL (ref 1.7–7.7)
Neutrophils Relative %: 64 %
Platelets: 243 10*3/uL (ref 150–400)
RBC: 4.2 MIL/uL (ref 3.87–5.11)
RDW: 17.4 % — ABNORMAL HIGH (ref 11.5–15.5)
WBC: 7.8 10*3/uL (ref 4.0–10.5)
nRBC: 0 % (ref 0.0–0.2)

## 2022-06-26 LAB — MAGNESIUM: Magnesium: 2.2 mg/dL (ref 1.7–2.4)

## 2022-06-26 MED ORDER — SODIUM CHLORIDE 0.9 % IV SOLN: Freq: Once | INTRAVENOUS | Status: AC

## 2022-06-26 MED ORDER — DARATUMUMAB-HYALURONIDASE-FIHJ 1800-30000 MG-UT/15ML ~~LOC~~ SOLN
1800.0000 mg | Freq: Once | SUBCUTANEOUS | Status: AC
  Administered 2022-06-26: 1800 mg via SUBCUTANEOUS
  Filled 2022-06-26: qty 15

## 2022-06-26 MED ORDER — LENALIDOMIDE 10 MG PO CAPS: 10.0000 mg | ORAL_CAPSULE | Freq: Every day | ORAL | 0 refills | Status: DC

## 2022-06-26 MED ORDER — ZOLEDRONIC ACID 4 MG/100ML IV SOLN
4.0000 mg | Freq: Once | INTRAVENOUS | Status: AC
  Administered 2022-06-26: 4 mg via INTRAVENOUS
  Filled 2022-06-26: qty 100

## 2022-06-26 NOTE — Progress Notes (Signed)
Amanda Conner, [name]   CLINIC:  Medical Oncology/Hematology  PCP:  Yvone Neu, MD [facility] [address] /* [phone]   REASON FOR VISIT:  Follow-up for multiple myeloma  PRIOR THERAPY: none  NGS Results: not done  CURRENT THERAPY: DaraVRd (Daratumumab SQ) q21d x 6 Cycles (Induction/Consolidation)  BRIEF ONCOLOGIC HISTORY:  Oncology History  Multiple myeloma ([place])  07/17/2021 Initial Diagnosis   Multiple myeloma ([place])   07/26/2021 - 03/15/2022 Chemotherapy   Patient is on Treatment Plan : MYELOMA NEWLY DIAGNOSED TRANSPLANT CANDIDATE DaraVRd (Daratumumab SQ) q21d x 6 Cycles (Induction/Consolidation)     04/17/2022 -  Chemotherapy   Patient is on Treatment Plan : MYELOMA NEWLY DIAGNOSED TRANSPLANT CANDIDATE Daratumumab SQ + Lenalidomide q28d (Maintenance)       CANCER STAGING:  Cancer Staging  Multiple myeloma ([place]) Staging form: Multiple Myeloma, AJCC 6th Edition - Clinical stage from 07/17/2021: Stage IA - Unsigned   INTERVAL HISTORY:  [name], a 71 y.o. female, seen for follow-up of multiple myeloma.  Recently had upper respiratory infection and was tested positive for COVID at home.  However repeat testing in the ER was negative.  She was given medication for COVID, which she took only for half the days.  REVIEW OF SYSTEMS:  Review of Systems  Respiratory:  Positive for cough and shortness of breath.   Gastrointestinal:  Positive for constipation and diarrhea. Negative for abdominal pain.  Musculoskeletal:  Positive for back pain (5/10).  All other systems reviewed and are negative.   PAST MEDICAL/SURGICAL HISTORY:  Past Medical History:  Diagnosis Date   Arthritis    osteoarthritis   Back pain    Bronchiectasis (HCC)    Cancer (HCC)    basal cell nose   Chronic cough    Complication of anesthesia    Dyspnea    Gastrointestinal symptoms    GERD  (gastroesophageal reflux disease)    Glaucoma    History of hiatal hernia    Hyperlipemia    Hypertension    Multiple myeloma (HCC)    Osteopenia    Pneumonia    PONV (postoperative nausea and vomiting)    "only one time"   Spondylisthesis    Past Surgical History:  Procedure Laterality Date   ABDOMINAL EXPOSURE N/A 12/29/2018   Procedure: ABDOMINAL EXPOSURE;  Surgeon: Angelia Mould, MD;  Location: [place];  Service: Vascular;  Laterality: N/A;   ABDOMINOPLASTY  2002   ABDOMINOPLASTY  1970's   ANKLE SURGERY Left 10/2015   ANTERIOR LATERAL LUMBAR FUSION WITH PERCUTANEOUS SCREW 3 LEVEL Left 12/29/2018   Procedure: Lumbar Two to Lumbar Five Anterolateral lumbar interbody fusion;  Surgeon: Erline Levine, MD;  Location: [place];  Service: Neurosurgery;  Laterality: Left;  anterolateral   ANTERIOR LUMBAR FUSION N/A 12/29/2018   Procedure: Lumbar Five Sacral One Anterior lumbar interbody fusion;  Surgeon: Erline Levine, MD;  Location: [place];  Service: Neurosurgery;  Laterality: N/A;  anterior approach   BASAL CELL CARCINOMA EXCISION  06/2018   nose   BLEPHAROPLASTY Bilateral 1999   BREAST ENHANCEMENT SURGERY  1970's   COLONOSCOPY     CYSTOURETHROSCOPY  2018   [place] J ureteal stents   DECOMPRESSION CORE HIP Left 2014   FACIAL COSMETIC SURGERY  2004   FLUOROSCOPY GUIDANCE     HIP ARTHROPLASTY Left    INCISIONAL HERNIA REPAIR N/A 10/08/2019   Procedure: OPEN INCISIONAL HERNIA  REPAIR WITH MESH;  Surgeon: Armandina Gemma, MD;  Location: [facility];  Service: General;  Laterality: N/A;   JOINT REPLACEMENT Left    Hip; 05/2014   KYPHOPLASTY N/A 08/31/2021   Procedure: KYPHOPLASTY THORACIC ELEVEN, THORACIC TWELVE;  Surgeon: Karsten Ro, DO;  Location: [place];  Service: Neurosurgery;  Laterality: N/A;   LAPAROSCOPIC PARTIAL COLECTOMY  06/26/2017   LUMBAR PERCUTANEOUS PEDICLE SCREW 4 LEVEL N/A 12/29/2018   Procedure: Lumbar Percutaneous Pedicle Screw Placement Lumbar two-Sacral one;  Surgeon:  Erline Levine, MD;  Location: [place];  Service: Neurosurgery;  Laterality: N/A;   MOHS SURGERY  2019   nose   PARTIAL COLECTOMY  06/2017   for diverticulitis   REMOVAL OF BILATERAL TISSUE EXPANDERS WITH PLACEMENT OF BILATERAL BREAST IMPLANTS  2004   THIGH LIFT  1994   TOE FUSION Right    great toe   TONSILLECTOMY     UMBILICAL HERNIA REPAIR     with tummy tuck   UMBILICAL HERNIA REPAIR  2002    SOCIAL HISTORY:  Social History   Socioeconomic History   Marital status: Married    Spouse name: Not on file   Number of children: Not on file   Years of education: Not on file   Highest education level: Not on file  Occupational History   Not on file  Tobacco Use   Smoking status: Never   Smokeless tobacco: Never  Vaping Use   Vaping Use: Never used  Substance and Sexual Activity   Alcohol use: Yes    Comment: occasional   Drug use: Never   Sexual activity: Not on file  Other Topics Concern   Not on file  Social History Narrative   Not on file   Social Determinants of Health   Financial Resource Strain: Low Risk  (07/24/2021)   Overall Financial Resource Strain (CARDIA)    Difficulty of Paying Living Expenses: Not hard at all  Food Insecurity: No Food Insecurity (07/24/2021)   Hunger Vital Sign    Worried About Running Out of Food in the Last Year: Never true    [place] in the Last Year: Never true  Transportation Needs: No Transportation Needs (07/24/2021)   PRAPARE - Hydrologist (Medical): No    Lack of Transportation (Non-Medical): No  Physical Activity: Not on file  Stress: Not on file  Social Connections: Socially Integrated (07/24/2021)   Social Connection and Isolation Panel [NHANES]    Frequency of Communication with Friends and Family: More than three times a week    Frequency of Social Gatherings with Friends and Family: More than three times a week    Attends Religious Services: 1 to 4 times per year    Active Member of  [employer] or Organizations: No    Attends Music therapist: 1 to 4 times per year    Marital Status: Married  Human resources officer Violence: Not on file    FAMILY HISTORY:  Family History  Problem Relation Age of Onset   Hypertension Mother    COPD Mother    Hypertension Father    Heart disease Father     CURRENT MEDICATIONS:  Current Outpatient Medications  Medication Sig Dispense Refill   albuterol (VENTOLIN HFA) 108 (90 Base) MCG/ACT inhaler Inhale 2 puffs into the lungs every 6 (six) hours as needed for wheezing or shortness of breath.     aspirin EC 81 MG tablet Take 81 mg by mouth  daily. Swallow whole.     Baclofen 5 MG TABS Take 1 tablet by mouth 3 (three) times daily as needed.     benzonatate (TESSALON) 100 MG capsule Take 100 mg by mouth 3 (three) times daily as needed for cough.     Biotin 1000 MCG tablet Take 1,000 mcg by mouth daily.     calcitonin, salmon, (MIACALCIN/FORTICAL) 200 UNIT/ACT nasal spray Place 1 spray into alternate nostrils daily.     Calcium Carb-Cholecalciferol 600-800 MG-UNIT TABS Take 1 tablet by mouth daily.     daratumumab-hyaluronidase-fihj (DARZALEX FASPRO) 1800-30000 MG-UT/15ML SOLN Inject 1,800 mg into the skin once a week.     dexamethasone (DECADRON) 2 MG tablet Take 5 tablets (10 mg total) by mouth every 30 (thirty) days. 20 tablet 4   dextromethorphan-guaiFENesin (MUCINEX DM) 30-600 MG 12hr tablet Take 1 tablet by mouth daily.      estrogen, conjugated,-medroxyprogesterone (PREMPRO) 0.45-1.5 MG tablet Take 1 tablet by mouth daily.     feeding supplement (ENSURE ENLIVE / ENSURE PLUS) LIQD Take 237 mLs by mouth 2 (two) times daily between meals. (Patient taking differently: Take 237 mLs by mouth daily.) 237 mL 12   fluticasone (FLONASE) 50 MCG/ACT nasal spray Place 1 spray into both nostrils daily.     furosemide (LASIX) 20 MG tablet TAKE 1 TABLET BY MOUTH EVERY DAY AS NEEDED 90 tablet 3   hydrochlorothiazide (HYDRODIURIL) 25 MG tablet  Take 25 mg by mouth daily as needed (swelling).     HYDROcodone bit-homatropine (HYCODAN) 5-1.5 MG/5ML syrup Take 5 mLs by mouth every 6 (six) hours as needed for cough. 473 mL 0   Lactulose 20 GM/30ML SOLN Take 30 ml by mouth every 3 hours until bowel movement is had, then take 30 ml daily (Patient taking differently: Take 20 g by mouth daily as needed (constipation).) 946 mL 3   latanoprost (XALATAN) 0.005 % ophthalmic solution Place 1 drop into both eyes at bedtime.      lenalidomide (REVLIMID) 10 MG capsule Take 1 capsule (10 mg total) by mouth daily. 21 days on, 7 days off 21 capsule 0   linaclotide (LINZESS) 145 MCG CAPS capsule Take 145 mcg by mouth daily as needed (constipation).     lisinopril (ZESTRIL) 20 MG tablet Take 20 mg by mouth daily.     loratadine (CLARITIN) 10 MG tablet Take 10 mg by mouth daily.     loteprednol (LOTEMAX) 0.5 % ophthalmic suspension Place into the left eye.     methocarbamol (ROBAXIN) 500 MG tablet Take 1 tablet (500 mg total) by mouth every 6 (six) hours as needed for muscle spasms. 40 tablet 3   omeprazole (PRILOSEC) 20 MG capsule Take 20 mg by mouth daily.     Oxycodone HCl 10 MG TABS Take 1 tablet (10 mg total) by mouth every 6 (six) hours as needed. 120 tablet 0   pantoprazole (PROTONIX) 40 MG tablet Take 40 mg by mouth daily.     sucralfate (CARAFATE) 1 g tablet Take 1 g by mouth 4 (four) times daily.     SUMAtriptan (IMITREX) 100 MG tablet Take 100 mg by mouth every 2 (two) hours as needed for migraine. May repeat in 2 hours if headache persists or recurs.     tiZANidine (ZANAFLEX) 2 MG tablet Take 2 mg by mouth every 6 (six) hours as needed.     UNABLE TO FIND Place 0.5 g into both eyes 3 (three) times daily. Med Name: Jearld Pies (otepredol)  ondansetron (ZOFRAN ODT) 4 MG disintegrating tablet Take 1 tablet (4 mg total) by mouth every 4 (four) hours as needed for nausea or vomiting. 20 tablet 6   No current facility-administered medications for this  visit.    ALLERGIES:  Allergies  Allergen Reactions   Tobramycin-Dexamethasone Other (See Comments)    Itching and swelling   Ethambutol Other (See Comments)    Fever   Neomycin-Polymyxin-Dexameth Itching and Swelling   Amikacin Other (See Comments)    Eosinophilia with chills and aching when given with cefoxitin   Biaxin [Clarithromycin] Itching   Cefoxitin Other (See Comments)    Eosinophlia when given with amikacin   Sulfamethoxazole-Trimethoprim Itching   Vancomycin     "Got red splotches on skin after several doses"   Bacitracin-Polymyxin B Rash    PHYSICAL EXAM:  Performance status (ECOG): 1 - Symptomatic but completely ambulatory  Vitals:   06/26/22 1306  BP: 133/70  Pulse: 63  Resp: 18  Temp: 97.8 F (36.6 C)  SpO2: 98%    Wt Readings from Last 3 Encounters:  06/26/22 115 lb 8.3 oz (52.4 kg)  06/02/22 117 lb (53.1 kg)  05/30/22 119 lb (54 kg)   Physical Exam Vitals reviewed.  Constitutional:      Appearance: Normal appearance.     Comments: In wheelchair  Cardiovascular:     Rate and Rhythm: Normal rate and regular rhythm.     Pulses: Normal pulses.     Heart sounds: Normal heart sounds.  Pulmonary:     Effort: Pulmonary effort is normal.     Breath sounds: Normal breath sounds.  Neurological:     General: No focal deficit present.     Mental Status: She is alert and oriented to person, place, and time.  Psychiatric:        Mood and Affect: Mood normal.        Behavior: Behavior normal.     LABORATORY DATA:  I have reviewed the labs as listed.     Latest Ref Rng & Units 06/26/2022   12:08 PM 06/02/2022    3:38 PM 05/30/2022    9:34 AM  CBC  WBC 4.0 - 10.5 K/uL 7.8  8.5  5.6   Hemoglobin 12.0 - 15.0 g/dL 11.7  11.6  11.5   Hematocrit 36.0 - 46.0 % 36.7  36.2  36.3   Platelets 150 - 400 K/uL 243  167  181       Latest Ref Rng & Units 06/26/2022   12:08 PM 06/02/2022    3:38 PM 05/30/2022    8:57 AM  CMP  Glucose 70 - 99 mg/dL 108   100  127   BUN 8 - 23 mg/dL _0 Creatinine 0.44 - 1.00 mg/dL 0.74  0.81  0.83   Sodium 135 - 145 mmol/L 136  134  135   Potassium 3.5 - 5.1 mmol/L 4.6  4.5  3.8   Chloride 98 - 111 mmol/L 102  100  101   CO2 22 - 32 mmol/L _1 Calcium 8.9 - 10.3 mg/dL 9.0  9.4  9.1   Total Protein 6.5 - 8.1 g/dL 7.2   6.6   Total Bilirubin 0.3 - 1.2 mg/dL 0.6   0.6   Alkaline Phos 38 - 126 U/L 62   59   AST 15 - 41 U/L 22   23   ALT 0 - 44 U/L 22  25     DIAGNOSTIC IMAGING:  I have independently reviewed the scans and discussed with the patient. DG Chest 2 View  Result Date: 06/02/2022 CLINICAL DATA:  Cough EXAM: CHEST - 2 VIEW COMPARISON:  11/23/2021 FINDINGS: Stable cardiomediastinal contours. Streaky bibasilar opacities are similar in appearance to the previous study and likely reflect scarring and/or atelectasis. No definite new airspace consolidation. No pleural effusion or pneumothorax. Lower thoracic compression deformities status post cement augmentation with focal kyphosis. IMPRESSION: Streaky bibasilar opacities are similar in appearance to the previous study and likely reflect scarring and/or atelectasis. Electronically Signed   By: Davina Poke D.O.   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 Wilburn Mylar - She reported discomfort in the left posterior back for the last 1 month.  She was seen by Dr. Chauncey Reading and x-rays showed abnormality.  A CT scan of the chest was done on 05/24/2021. - CT chest report reviewed by me from 05/24/2021 showed expansile oval-shaped lesion within the posterior aspect of the seventh rib with a nodule measuring 2.1 x 1.1 cm.  Adjacent cortex demonstrates area of thinning and destruction.  Stable emphysematous and interstitial changes within the lungs.  Stable pulmonary nodules within the right upper lobe and left upper lobe when compared to CT from September  2020. - CT of the abdomen and pelvis did not show any significant abnormalities. - She denies any fevers, night sweats or weight loss in the last 6 months.  She had history of basal cell carcinoma on the nose removed by Mohs surgery. - She also reported history of osteoporosis and broke left upper rib 1 year ago after sneezing. -Reviewed PET scan from 06/14/2021.  Mild hypermetabolism involving left aspect of the L5 vertebral body and also pedicle region surrounding the screw.  SUV 4.2.  There appears to be a lytic process on the CT scan.  The lytic left posterior seventh rib lesion is mildly hypermetabolic with SUV 6.19.  No other definite lytic hypermetabolic bone lesions seen. - We have reviewed left seventh rib bone biopsy from 06/25/2021 consistent with plasma cell neoplasm.  Cells are positive for CD138, CD56 and a kappa restricted.  - Labs on 06/13/2021 M spike 1.7 g.  Immunofixation IgA kappa.  Free light chain shows kappa light chain 71, ratio 8.22.  LDH and beta-2 microglobulin was normal. - Bone marrow biopsy on 07/04/2021 consistent with plasma cell neoplasm with 26% plasma cells on aspirate with atypical features and 50 to 60% on core biopsy.  Chromosome analysis was normal.  Multiple myeloma FISH panel shows loss of NF1/chromosome 17/17 q.  Gain of CCN D1 or chromosome 11/11 q.  Gain of chromosome 4/4P.  Loss of material from the long-arm of chromosome 13.  These are all standard risk features. - 24-hour urine total protein to 40 mg.  Urine immunofixation was negative.  Urine kappa light chains were 4.13. - 8 cycles of Dara RVD from 07/26/2021 through 03/01/2022 - T11 and T12 vertebroplasty by Dr. Reatha Armour on 08/31/2021. - She has decided against bone marrow transplant after the advice of Dr. Lorenda Cahill.   Social/family history: - She is married and lives at home with her husband.  She has not worked but had help her husband and his work.  She is a non-smoker. - Maternal grandmother had colon  cancer.  Mother had a squamous cell carcinoma of the skin.  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  Free light chain ratio 2.12 with kappa light chain 17.4. - CBC shows normal white count and platelets.  Creatinine was normal. - She will proceed with daratumumab today. - I will see her back in 4 weeks for follow-up with myeloma labs and Dara specific immunofixation.  2.  Mid back pain (s/p vertebroplasty of T11 and T12 by Dr. Reatha Armour on 08/31/2021): - She had recent injection for lower back pain. - Continue methocarbamol/baclofen/tizanidine as needed.  3.  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 Lorenda Cahill at [facility]. - She is having a bronchoscopy next Wednesday in [place] and possibly cultures sent on it.  4.  Myeloma bone disease: - Calcium is normal today.  Proceed with Zometa every 4 weeks.  5.  ID prophylaxis: - She discontinued Valtrex 1 month after last Velcade.  She could not continue it due to abdominal side effects.   Orders placed this encounter:  Orders Placed This Encounter  Procedures   CBC with Differential   Comprehensive metabolic panel   CBC with Differential   Comprehensive metabolic panel   Magnesium   Protein electrophoresis, serum   Kappa/lambda light chains       Derek Jack, MD [place] [phone]

## 2022-06-26 NOTE — Patient Instructions (Signed)
[facility]  Discharge Instructions: Thank you for choosing [facility] to provide your oncology and hematology care.  If you have a lab appointment with the [facility], please come in thru the Main Entrance and check in at the main information desk.  Wear comfortable clothing and clothing appropriate for easy access to any Portacath or PICC line.   We strive to give you quality time with your provider. You may need to reschedule your appointment if you arrive late (15 or more minutes).  Arriving late affects you and other patients whose appointments are after yours.  Also, if you miss three or more appointments without notifying the office, you may be dismissed from the clinic at the provider's discretion.      For prescription refill requests, have your pharmacy contact our office and allow 72 hours for refills to be completed.    Today you received the following chemotherapy and/or immunotherapy agents Daratumumab/Zometa.  Zoledronic Acid Injection (Cancer) What is this medication? ZOLEDRONIC ACID (ZOE le dron ik AS id) treats high calcium levels in the blood caused by cancer. It may also be used with chemotherapy to treat weakened bones caused by cancer. It works by slowing down the release of calcium from bones. This lowers calcium levels in your blood. It also makes your bones stronger and less likely to break (fracture). It belongs to a group of medications called bisphosphonates. This medicine may be used for other purposes; ask your health care provider or pharmacist if you have questions. COMMON BRAND NAME(S): Zometa, Zometa Powder What should I tell my care team before I take this medication? They need to know if you have any of these conditions: Dehydration Dental disease Kidney disease Liver disease Low levels of calcium in the blood Lung or breathing disease, such as asthma Receiving steroids, such as dexamethasone or prednisone An  unusual or allergic reaction to zoledronic acid, other medications, foods, dyes, or preservatives Pregnant or trying to get pregnant Breast-feeding How should I use this medication? This medication is injected into a vein. It is given by your care team in a hospital or clinic setting. Talk to your care team about the use of this medication in children. Special care may be needed. Overdosage: If you think you have taken too much of this medicine contact a poison control center or emergency room at once. NOTE: This medicine is only for you. Do not share this medicine with others. What if I miss a dose? Keep appointments for follow-up doses. It is important not to miss your dose. Call your care team if you are unable to keep an appointment. What may interact with this medication? Certain antibiotics given by injection Diuretics, such as bumetanide, furosemide NSAIDs, medications for pain and inflammation, such as ibuprofen or naproxen Teriparatide Thalidomide This list may not describe all possible interactions. Give your health care provider a list of all the medicines, herbs, non-prescription drugs, or dietary supplements you use. Also tell them if you smoke, drink alcohol, or use illegal drugs. Some items may interact with your medicine. What should I watch for while using this medication? Visit your care team for regular checks on your progress. It may be some time before you see the benefit from this medication. Some people who take this medication have severe bone, joint, or muscle pain. This medication may also increase your risk for jaw problems or a broken thigh bone. Tell your care team right away if you have severe  pain in your jaw, bones, joints, or muscles. Tell you care team if you have any pain that does not go away or that gets worse. Tell your dentist and dental surgeon that you are taking this medication. You should not have major dental surgery while on this medication. See your  dentist to have a dental exam and fix any dental problems before starting this medication. Take good care of your teeth while on this medication. Make sure you see your dentist for regular follow-up appointments. You should make sure you get enough calcium and vitamin D while you are taking this medication. Discuss the foods you eat and the vitamins you take with your care team. Check with your care team if you have severe diarrhea, nausea, and vomiting, or if you sweat a lot. The loss of too much body fluid may make it dangerous for you to take this medication. You may need bloodwork while taking this medication. Talk to your care team if you wish to become pregnant or think you might be pregnant. This medication can cause serious birth defects. What side effects may I notice from receiving this medication? Side effects that you should report to your care team as soon as possible: Allergic reactions--skin rash, itching, hives, swelling of the face, lips, tongue, or throat Kidney injury--decrease in the amount of urine, swelling of the ankles, hands, or feet Low calcium level--muscle pain or cramps, confusion, tingling, or numbness in the hands or feet Osteonecrosis of the jaw--pain, swelling, or redness in the mouth, numbness of the jaw, poor healing after dental work, unusual discharge from the mouth, visible bones in the mouth Severe bone, joint, or muscle pain Side effects that usually do not require medical attention (report to your care team if they continue or are bothersome): Constipation Fatigue Fever Loss of appetite Nausea Stomach pain This list may not describe all possible side effects. Call your doctor for medical advice about side effects. You may report side effects to FDA at 1-800-FDA-1088. Where should I keep my medication? This medication is given in a hospital or clinic. It will not be stored at home. NOTE: This sheet is a summary. It may not cover all possible information.  If you have questions about this medicine, talk to your doctor, pharmacist, or health care provider.  2023 Elsevier/Gold Standard (2021-08-16 00:00:00) Daratumumab Injection What is this medication? DARATUMUMAB (dar a toom ue mab) treats multiple myeloma, a type of bone marrow cancer. It works by helping your immune system slow or stop the spread of cancer cells. It is a monoclonal antibody. This medicine may be used for other purposes; ask your health care provider or pharmacist if you have questions. COMMON BRAND NAME(S): DARZALEX What should I tell my care team before I take this medication? They need to know if you have any of these conditions: Hereditary fructose intolerance Infection, such as chickenpox, herpes, hepatitis B virus Lung or breathing disease, such as asthma, COPD An unusual or allergic reaction to daratumumab, sorbitol, other medications, foods, dyes, or preservatives Pregnant or trying to get pregnant Breast-feeding How should I use this medication? This medication is injected into a vein. It is given by your care team in a hospital or clinic setting. Talk to your care team about the use of this medication in children. Special care may be needed. Overdosage: If you think you have taken too much of this medicine contact a poison control center or emergency room at once. NOTE: This medicine is only for  you. Do not share this medicine with others. What if I miss a dose? Keep appointments for follow-up doses. It is important not to miss your dose. Call your care team if you are unable to keep an appointment. What may interact with this medication? Interactions have not been studied. This list may not describe all possible interactions. Give your health care provider a list of all the medicines, herbs, non-prescription drugs, or dietary supplements you use. Also tell them if you smoke, drink alcohol, or use illegal drugs. Some items may interact with your medicine. What  should I watch for while using this medication? Your condition will be monitored carefully while you are receiving this medication. This medication can cause serious allergic reactions. To reduce your risk, your care team may give you other medication to take before receiving this one. Be sure to follow the directions from your care team. This medication can affect the results of blood tests to match your blood type. These changes can last for up to 6 months after the final dose. Your care team will do blood tests to match your blood type before you start treatment. Tell all of your care team that you are being treated with this medication before receiving a blood transfusion. This medication can affect the results of some tests used to determine treatment response; extra tests may be needed to evaluate response. Talk to your care team if you wish to become pregnant or think you are pregnant. This medication can cause serious birth defects if taken during pregnancy and for 3 months after the last dose. A reliable form of contraception is recommended while taking this medication and for 3 months after the last dose. Talk to your care team about effective forms of contraception. Do not breast-feed while taking this medication. What side effects may I notice from receiving this medication? Side effects that you should report to your care team as soon as possible: Allergic reactions--skin rash, itching, hives, swelling of the face, lips, tongue, or throat Infection--fever, chills, cough, sore throat, wounds that don't heal, pain or trouble when passing urine, general feeling of discomfort or being unwell Infusion reactions--chest pain, shortness of breath or trouble breathing, feeling faint or lightheaded Unusual bruising or bleeding Side effects that usually do not require medical attention (report to your care team if they continue or are bothersome): Constipation Diarrhea Fatigue Nausea Pain,  tingling, or numbness in the hands or feet Swelling of the ankles, hands, or feet This list may not describe all possible side effects. Call your doctor for medical advice about side effects. You may report side effects to FDA at 1-800-FDA-1088. Where should I keep my medication? This medication is given in a hospital or clinic. It will not be stored at home. NOTE: This sheet is a summary. It may not cover all possible information. If you have questions about this medicine, talk to your doctor, pharmacist, or health care provider.  2023 Elsevier/Gold Standard (2021-10-24 00:00:00)        To help prevent nausea and vomiting after your treatment, we encourage you to take your nausea medication as directed.  BELOW ARE SYMPTOMS THAT SHOULD BE REPORTED IMMEDIATELY: *FEVER GREATER THAN 100.4 F (38 C) OR HIGHER *CHILLS OR SWEATING *NAUSEA AND VOMITING THAT IS NOT CONTROLLED WITH YOUR NAUSEA MEDICATION *UNUSUAL SHORTNESS OF BREATH *UNUSUAL BRUISING OR BLEEDING *URINARY PROBLEMS (pain or burning when urinating, or frequent urination) *BOWEL PROBLEMS (unusual diarrhea, constipation, pain near the anus) TENDERNESS IN MOUTH AND THROAT WITH  OR WITHOUT PRESENCE OF ULCERS (sore throat, sores in mouth, or a toothache) UNUSUAL RASH, SWELLING OR PAIN  UNUSUAL VAGINAL DISCHARGE OR ITCHING   Items with * indicate a potential emergency and should be followed up as soon as possible or go to the Emergency Department if any problems should occur.  Please show the CHEMOTHERAPY ALERT CARD or IMMUNOTHERAPY ALERT CARD at check-in to the Emergency Department and triage nurse.  Should you have questions after your visit or need to cancel or reschedule your appointment, please contact [facility] [phone]  and follow the prompts.  Office hours are 8:00 a.m. to 4:30 p.m. Monday - Friday. Please note that voicemails left after 4:00 p.m. may not be returned until the following business day.  We  are closed weekends and major holidays. You have access to a nurse at all times for urgent questions. Please call the main number to the clinic [phone] and follow the prompts.  For any non-urgent questions, you may also contact your provider using MyChart. We now offer e-Visits for anyone 7 and older to request care online for non-urgent symptoms. For details visit [email or web address].   Also download the MyChart app! Go to the app store, search "MyChart", open the app, select [facility], and log in with your MyChart username and password.  Masks are optional in the cancer centers. If you would like for your care team to wear a mask while they are taking care of you, please let them know. You may have one support person who is at least 72 years old accompany you for your appointments.

## 2022-06-26 NOTE — Progress Notes (Signed)
Patient has been assessed, vital signs and labs have been reviewed by Dr. Katragadda. ANC, Creatinine, LFTs, and Platelets are within treatment parameters per Dr. Katragadda. The patient is good to proceed with treatment at this time. Primary RN and pharmacy aware.  

## 2022-06-26 NOTE — Progress Notes (Signed)
Patient presents today for Daratumumab injection and Zometa infusion.  Patient is in satisfactory condition with no new complaints voiced.  Vital signs are stable.  Labs reviewed by Dr. Delton Coombes during her office visit.  All labs are within treatment parameters.  We will proceed with treatment per MD orders.  Patient tolerated treatment well with no complaints voiced.  Patient left with rollaider in stable condition.  Vital signs stable at discharge.  Follow up as scheduled.

## 2022-06-26 NOTE — Telephone Encounter (Signed)
Chart reviewed. Revlimid refilled per last office note with Dr. Katragadda.  

## 2022-06-26 NOTE — Patient Instructions (Signed)
[facility]  Discharge Instructions  You were seen and examined today by Dr. Delton Coombes.  Proceed with treatment as scheduled.  Follow-up as scheduled.  Thank you for choosing [facility] to provide your oncology and hematology care.   To afford each patient quality time with our provider, please arrive at least 15 minutes before your scheduled appointment time. You may need to reschedule your appointment if you arrive late (10 or more minutes). Arriving late affects you and other patients whose appointments are after yours.  Also, if you miss three or more appointments without notifying the office, you may be dismissed from the clinic at the provider's discretion.    Again, thank you for choosing [facility].  Our hope is that these requests will decrease the amount of time that you wait before being seen by our physicians.   If you have a lab appointment with the [facility] please come in thru the Main Entrance and check in at the main information desk.           _____________________________________________________________  Should you have questions after your visit to [facility], please contact our office at [phone] and follow the prompts.  Our office hours are 8:00 a.m. to 4:30 p.m. Monday - Thursday and 8:00 a.m. to 2:30 p.m. Friday.  Please note that voicemails left after 4:00 p.m. may not be returned until the following business day.  We are closed weekends and all major holidays.  You do have access to a nurse 24-7, just call the main number to the clinic [phone] and do not press any options, hold on the line and a nurse will answer the phone.    For prescription refill requests, have your pharmacy contact our office and allow 72 hours.    Masks are optional in the cancer centers. If you would like for your care team to wear a mask while they are taking care of you, please let them know.  You may have one support person who is at least 72 years old accompany you for your appointments.

## 2022-06-27 ENCOUNTER — Other Ambulatory Visit: Payer: Self-pay

## 2022-06-30 ENCOUNTER — Other Ambulatory Visit: Payer: Self-pay

## 2022-07-09 ENCOUNTER — Other Ambulatory Visit: Payer: Self-pay | Admitting: *Deleted

## 2022-07-09 DIAGNOSIS — C9000 Multiple myeloma not having achieved remission: Secondary | ICD-10-CM

## 2022-07-26 ENCOUNTER — Other Ambulatory Visit: Payer: Self-pay

## 2022-07-26 MED ORDER — LENALIDOMIDE 10 MG PO CAPS: 10.0000 mg | ORAL_CAPSULE | Freq: Every day | ORAL | 0 refills | Status: DC

## 2022-07-26 NOTE — Telephone Encounter (Signed)
Chart reviewed. Revlimid refilled per verbal order from Dr. Katragadda. 

## 2022-07-30 ENCOUNTER — Ambulatory Visit: Payer: Medicare Other

## 2022-07-30 ENCOUNTER — Inpatient Hospital Stay: Payer: Medicare Other | Attending: Hematology

## 2022-07-30 ENCOUNTER — Inpatient Hospital Stay: Payer: Medicare Other

## 2022-07-30 ENCOUNTER — Encounter: Payer: Self-pay | Admitting: Hematology

## 2022-07-30 ENCOUNTER — Inpatient Hospital Stay (HOSPITAL_BASED_OUTPATIENT_CLINIC_OR_DEPARTMENT_OTHER): Payer: Medicare Other | Admitting: Hematology

## 2022-07-30 ENCOUNTER — Ambulatory Visit: Payer: Medicare Other | Admitting: Hematology

## 2022-07-30 DIAGNOSIS — Z808 Family history of malignant neoplasm of other organs or systems: Secondary | ICD-10-CM | POA: Insufficient documentation

## 2022-07-30 DIAGNOSIS — M5489 Other dorsalgia: Secondary | ICD-10-CM | POA: Diagnosis not present

## 2022-07-30 DIAGNOSIS — A319 Mycobacterial infection, unspecified: Secondary | ICD-10-CM | POA: Insufficient documentation

## 2022-07-30 DIAGNOSIS — C9000 Multiple myeloma not having achieved remission: Secondary | ICD-10-CM

## 2022-07-30 DIAGNOSIS — R197 Diarrhea, unspecified: Secondary | ICD-10-CM | POA: Insufficient documentation

## 2022-07-30 DIAGNOSIS — R0602 Shortness of breath: Secondary | ICD-10-CM | POA: Diagnosis not present

## 2022-07-30 DIAGNOSIS — Z5112 Encounter for antineoplastic immunotherapy: Secondary | ICD-10-CM | POA: Insufficient documentation

## 2022-07-30 DIAGNOSIS — Z9221 Personal history of antineoplastic chemotherapy: Secondary | ICD-10-CM | POA: Diagnosis not present

## 2022-07-30 DIAGNOSIS — K5900 Constipation, unspecified: Secondary | ICD-10-CM | POA: Insufficient documentation

## 2022-07-30 DIAGNOSIS — Z85828 Personal history of other malignant neoplasm of skin: Secondary | ICD-10-CM | POA: Insufficient documentation

## 2022-07-30 DIAGNOSIS — R059 Cough, unspecified: Secondary | ICD-10-CM | POA: Diagnosis not present

## 2022-07-30 DIAGNOSIS — Z800 Family history of malignant neoplasm of digestive organs: Secondary | ICD-10-CM | POA: Insufficient documentation

## 2022-07-30 DIAGNOSIS — Z79899 Other long term (current) drug therapy: Secondary | ICD-10-CM | POA: Diagnosis not present

## 2022-07-30 LAB — CBC WITH DIFFERENTIAL/PLATELET
Abs Immature Granulocytes: 0.02 10*3/uL (ref 0.00–0.07)
Basophils Absolute: 0.1 10*3/uL (ref 0.0–0.1)
Basophils Relative: 1 %
Eosinophils Absolute: 0.4 10*3/uL (ref 0.0–0.5)
Eosinophils Relative: 7 %
HCT: 37.2 % (ref 36.0–46.0)
Hemoglobin: 12.2 g/dL (ref 12.0–15.0)
Immature Granulocytes: 0 %
Lymphocytes Relative: 24 %
Lymphs Abs: 1.3 10*3/uL (ref 0.7–4.0)
MCH: 28.5 pg (ref 26.0–34.0)
MCHC: 32.8 g/dL (ref 30.0–36.0)
MCV: 86.9 fL (ref 80.0–100.0)
Monocytes Absolute: 0.4 10*3/uL (ref 0.1–1.0)
Monocytes Relative: 7 %
Neutro Abs: 3.5 10*3/uL (ref 1.7–7.7)
Neutrophils Relative %: 61 %
Platelets: 175 10*3/uL (ref 150–400)
RBC: 4.28 MIL/uL (ref 3.87–5.11)
RDW: 18.8 % — ABNORMAL HIGH (ref 11.5–15.5)
WBC: 5.7 10*3/uL (ref 4.0–10.5)
nRBC: 0 % (ref 0.0–0.2)

## 2022-07-30 LAB — COMPREHENSIVE METABOLIC PANEL
ALT: 24 U/L (ref 0–44)
AST: 22 U/L (ref 15–41)
Albumin: 4.1 g/dL (ref 3.5–5.0)
Alkaline Phosphatase: 64 U/L (ref 38–126)
Anion gap: 10 (ref 5–15)
BUN: 18 mg/dL (ref 8–23)
CO2: 26 mmol/L (ref 22–32)
Calcium: 9.2 mg/dL (ref 8.9–10.3)
Chloride: 99 mmol/L (ref 98–111)
Creatinine, Ser: 0.83 mg/dL (ref 0.44–1.00)
GFR, Estimated: >60 mL/min (ref >60)
Glucose, Bld: 107 mg/dL — ABNORMAL HIGH (ref 70–99)
Potassium: 3.7 mmol/L (ref 3.5–5.1)
Sodium: 135 mmol/L (ref 135–145)
Total Bilirubin: 0.4 mg/dL (ref 0.3–1.2)
Total Protein: 6.8 g/dL (ref 6.5–8.1)

## 2022-07-30 LAB — MAGNESIUM: Magnesium: 1.8 mg/dL (ref 1.7–2.4)

## 2022-07-30 MED ORDER — ZOLEDRONIC ACID 4 MG/100ML IV SOLN
4.0000 mg | Freq: Once | INTRAVENOUS | Status: AC
  Administered 2022-07-30: 4 mg via INTRAVENOUS
  Filled 2022-07-30: qty 100

## 2022-07-30 MED ORDER — DARATUMUMAB-HYALURONIDASE-FIHJ 1800-30000 MG-UT/15ML ~~LOC~~ SOLN
1800.0000 mg | Freq: Once | SUBCUTANEOUS | Status: AC
  Administered 2022-07-30: 1800 mg via SUBCUTANEOUS
  Filled 2022-07-30: qty 15

## 2022-07-30 MED ORDER — SODIUM CHLORIDE 0.9 % IV SOLN: Freq: Once | INTRAVENOUS | Status: AC

## 2022-07-30 NOTE — Patient Instructions (Addendum)
[facility]  Discharge Instructions  You were seen and examined today by Dr. Delton Coombes.  What labs we have back are currently stable, of course your Myeloma labs are not back yet.  Proceed with treatment as planned.  Please restart Valtrex '500mg'$  daily.  Follow-up as scheduled.  Thank you for choosing [facility] to provide your oncology and hematology care.   To afford each patient quality time with our provider, please arrive at least 15 minutes before your scheduled appointment time. You may need to reschedule your appointment if you arrive late (10 or more minutes). Arriving late affects you and other patients whose appointments are after yours.  Also, if you miss three or more appointments without notifying the office, you may be dismissed from the clinic at the provider's discretion.    Again, thank you for choosing [facility].  Our hope is that these requests will decrease the amount of time that you wait before being seen by our physicians.   If you have a lab appointment with the [facility] please come in thru the Main Entrance and check in at the main information desk.           _____________________________________________________________  Should you have questions after your visit to [facility], please contact our office at [phone] and follow the prompts.  Our office hours are 8:00 a.m. to 4:30 p.m. Monday - Thursday and 8:00 a.m. to 2:30 p.m. Friday.  Please note that voicemails left after 4:00 p.m. may not be returned until the following business day.  We are closed weekends and all major holidays.  You do have access to a nurse 24-7, just call the main number to the clinic [phone] and do not press any options, hold on the line and a nurse will answer the phone.    For prescription refill requests, have your pharmacy contact our office and allow 72 hours.    Masks are optional  in the cancer centers. If you would like for your care team to wear a mask while they are taking care of you, please let them know. You may have one support person who is at least 73 years old accompany you for your appointments.

## 2022-07-30 NOTE — Patient Instructions (Signed)
[facility]  Discharge Instructions: Thank you for choosing [facility] to provide your oncology and hematology care.  If you have a lab appointment with the [facility], please come in thru the Main Entrance and check in at the main information desk.  Wear comfortable clothing and clothing appropriate for easy access to any Portacath or PICC line.   We strive to give you quality time with your provider. You may need to reschedule your appointment if you arrive late (15 or more minutes).  Arriving late affects you and other patients whose appointments are after yours.  Also, if you miss three or more appointments without notifying the office, you may be dismissed from the clinic at the provider's discretion.      For prescription refill requests, have your pharmacy contact our office and allow 72 hours for refills to be completed.    Today you received the following chemotherapy and/or immunotherapy agents Dara Gaston and 4 mg IV   To help prevent nausea and vomiting after your treatment, we encourage you to take your nausea medication as directed.  BELOW ARE SYMPTOMS THAT SHOULD BE REPORTED IMMEDIATELY: *FEVER GREATER THAN 100.4 F (38 C) OR HIGHER *CHILLS OR SWEATING *NAUSEA AND VOMITING THAT IS NOT CONTROLLED WITH YOUR NAUSEA MEDICATION *UNUSUAL SHORTNESS OF BREATH *UNUSUAL BRUISING OR BLEEDING *URINARY PROBLEMS (pain or burning when urinating, or frequent urination) *BOWEL PROBLEMS (unusual diarrhea, constipation, pain near the anus) TENDERNESS IN MOUTH AND THROAT WITH OR WITHOUT PRESENCE OF ULCERS (sore throat, sores in mouth, or a toothache) UNUSUAL RASH, SWELLING OR PAIN  UNUSUAL VAGINAL DISCHARGE OR ITCHING   Items with * indicate a potential emergency and should be followed up as soon as possible or go to the Emergency Department if any problems should occur.  Please show the CHEMOTHERAPY ALERT CARD or IMMUNOTHERAPY ALERT CARD at check-in to the  Emergency Department and triage nurse.  Should you have questions after your visit or need to cancel or reschedule your appointment, please contact [facility] [phone]  and follow the prompts.  Office hours are 8:00 a.m. to 4:30 p.m. Monday - Friday. Please note that voicemails left after 4:00 p.m. may not be returned until the following business day.  We are closed weekends and major holidays. You have access to a nurse at all times for urgent questions. Please call the main number to the clinic [phone] and follow the prompts.  For any non-urgent questions, you may also contact your provider using MyChart. We now offer e-Visits for anyone 51 and older to request care online for non-urgent symptoms. For details visit [email or web address].   Also download the MyChart app! Go to the app store, search "MyChart", open the app, select [facility], and log in with your MyChart username and password.

## 2022-07-30 NOTE — Progress Notes (Signed)
Patient is taking Revlimid as prescribed. She has not missed any doses and reports no side effects at this time.   Patient has been assessed, vital signs and labs have been reviewed by Dr. Delton Coombes. ANC, Creatinine, LFTs, and Platelets are within treatment parameters per Dr. Delton Coombes. The patient is good to proceed with treatment at this time. Primary RN and pharmacy aware.

## 2022-07-30 NOTE — Progress Notes (Signed)
[address] [place], [place] [address]   CLINIC:  Medical Oncology/Hematology  PCP:  Yvone Neu, MD [facility] [address] /* [phone]   REASON FOR VISIT:  Follow-up for multiple myeloma  PRIOR THERAPY: none  NGS Results: not done  CURRENT THERAPY: DaraVRd (Daratumumab SQ) q21d x 6 Cycles (Induction/Consolidation)  BRIEF ONCOLOGIC HISTORY:  Oncology History  Multiple myeloma ([place])  07/17/2021 Initial Diagnosis   Multiple myeloma ([place])   07/26/2021 - 03/15/2022 Chemotherapy   Patient is on Treatment Plan : MYELOMA NEWLY DIAGNOSED TRANSPLANT CANDIDATE DaraVRd (Daratumumab SQ) q21d x 6 Cycles (Induction/Consolidation)     04/17/2022 -  Chemotherapy   Patient is on Treatment Plan : MYELOMA NEWLY DIAGNOSED TRANSPLANT CANDIDATE Daratumumab SQ + Lenalidomide q28d (Maintenance)       CANCER STAGING:  Cancer Staging  Multiple myeloma ([place]) Staging form: Multiple Myeloma, AJCC 6th Edition - Clinical stage from 07/17/2021: Stage IA - Unsigned   INTERVAL HISTORY:  Ms. TIFFANNY LAMARCHE, a 72 y.o. female, seen for follow-up of multiple myeloma and toxicity assessment prior to next cycle of daratumumab.  She is continuing Revlimid and will finish her last tablet tomorrow.  She reported some nausea in the last 3 to [redacted] weeks along with on and off diarrhea up to 3 loose stools per day.  Zofran helped with nausea.  She had to take Imodium and Pepto-Bismol couple of times.  Reported decreased appetite which is improving in the last 1 week.  Back pain is stable.  REVIEW OF SYSTEMS:  Review of Systems  Respiratory:  Positive for cough and shortness of breath.   Gastrointestinal:  Positive for constipation and diarrhea. Negative for abdominal pain.  Musculoskeletal:  Positive for back pain (5/10).  All other systems reviewed and are negative.   PAST MEDICAL/SURGICAL HISTORY:  Past Medical History:  Diagnosis Date    Arthritis    osteoarthritis   Back pain    Bronchiectasis (HCC)    Cancer (HCC)    basal cell nose   Chronic cough    Complication of anesthesia    Dyspnea    Gastrointestinal symptoms    GERD (gastroesophageal reflux disease)    Glaucoma    History of hiatal hernia    Hyperlipemia    Hypertension    Multiple myeloma (HCC)    Osteopenia    Pneumonia    PONV (postoperative nausea and vomiting)    "only one time"   Spondylisthesis    Past Surgical History:  Procedure Laterality Date   ABDOMINAL EXPOSURE N/A 12/29/2018   Procedure: ABDOMINAL EXPOSURE;  Surgeon: Angelia Mould, MD;  Location: [facility];  Service: Vascular;  Laterality: N/A;   ABDOMINOPLASTY  2002   ABDOMINOPLASTY  1970's   ANKLE SURGERY Left 10/2015   ANTERIOR LATERAL LUMBAR FUSION WITH PERCUTANEOUS SCREW 3 LEVEL Left 12/29/2018   Procedure: Lumbar Two to Lumbar Five Anterolateral lumbar interbody fusion;  Surgeon: Erline Levine, MD;  Location: [facility];  Service: Neurosurgery;  Laterality: Left;  anterolateral   ANTERIOR LUMBAR FUSION N/A 12/29/2018   Procedure: Lumbar Five Sacral One Anterior lumbar interbody fusion;  Surgeon: Erline Levine, MD;  Location: [facility];  Service: Neurosurgery;  Laterality: N/A;  anterior approach   BASAL CELL CARCINOMA EXCISION  06/2018   nose   BLEPHAROPLASTY Bilateral 1999   BREAST ENHANCEMENT SURGERY  1970's   COLONOSCOPY     CYSTOURETHROSCOPY  2018   [place] J  ureteal stents   DECOMPRESSION CORE HIP Left 2014   FACIAL COSMETIC SURGERY  2004   FLUOROSCOPY GUIDANCE     HIP ARTHROPLASTY Left    INCISIONAL HERNIA REPAIR N/A 10/08/2019   Procedure: OPEN INCISIONAL HERNIA REPAIR WITH MESH;  Surgeon: Armandina Gemma, MD;  Location: [facility];  Service: General;  Laterality: N/A;   JOINT REPLACEMENT Left    Hip; 05/2014   KYPHOPLASTY N/A 08/31/2021   Procedure: KYPHOPLASTY THORACIC ELEVEN, THORACIC TWELVE;  Surgeon: Karsten Ro, DO;  Location: [facility];  Service: Neurosurgery;   Laterality: N/A;   LAPAROSCOPIC PARTIAL COLECTOMY  06/26/2017   LUMBAR PERCUTANEOUS PEDICLE SCREW 4 LEVEL N/A 12/29/2018   Procedure: Lumbar Percutaneous Pedicle Screw Placement Lumbar two-Sacral one;  Surgeon: Erline Levine, MD;  Location: [facility];  Service: Neurosurgery;  Laterality: N/A;   MOHS SURGERY  2019   nose   PARTIAL COLECTOMY  06/2017   for diverticulitis   REMOVAL OF BILATERAL TISSUE EXPANDERS WITH PLACEMENT OF BILATERAL BREAST IMPLANTS  2004   THIGH LIFT  1994   TOE FUSION Right    great toe   TONSILLECTOMY     UMBILICAL HERNIA REPAIR     with tummy tuck   UMBILICAL HERNIA REPAIR  2002    SOCIAL HISTORY:  Social History   Socioeconomic History   Marital status: Married    Spouse name: Not on file   Number of children: Not on file   Years of education: Not on file   Highest education level: Not on file  Occupational History   Not on file  Tobacco Use   Smoking status: Never   Smokeless tobacco: Never  Vaping Use   Vaping Use: Never used  Substance and Sexual Activity   Alcohol use: Yes    Comment: occasional   Drug use: Never   Sexual activity: Not on file  Other Topics Concern   Not on file  Social History Narrative   Not on file   Social Determinants of Health   Financial Resource Strain: Low Risk  (07/24/2021)   Overall Financial Resource Strain (CARDIA)    Difficulty of Paying Living Expenses: Not hard at all  Food Insecurity: No Food Insecurity (07/24/2021)   Hunger Vital Sign    Worried About Running Out of Food in the Last Year: Never true    [place] in the Last Year: Never true  Transportation Needs: No Transportation Needs (07/24/2021)   PRAPARE - Hydrologist (Medical): No    Lack of Transportation (Non-Medical): No  Physical Activity: Not on file  Stress: Not on file  Social Connections: Socially Integrated (07/24/2021)   Social Connection and Isolation Panel [NHANES]    Frequency of Communication with  Friends and Family: More than three times a week    Frequency of Social Gatherings with Friends and Family: More than three times a week    Attends Religious Services: 1 to 4 times per year    Active Member of [employer] or Organizations: No    Attends Music therapist: 1 to 4 times per year    Marital Status: Married  Human resources officer Violence: Not on file    FAMILY HISTORY:  Family History  Problem Relation Age of Onset   Hypertension Mother    COPD Mother    Hypertension Father    Heart disease Father     CURRENT MEDICATIONS:  Current Outpatient Medications  Medication Sig Dispense Refill  albuterol (VENTOLIN HFA) 108 (90 Base) MCG/ACT inhaler Inhale 2 puffs into the lungs every 6 (six) hours as needed for wheezing or shortness of breath.     aspirin EC 81 MG tablet Take 81 mg by mouth daily. Swallow whole.     Baclofen 5 MG TABS Take 1 tablet by mouth 3 (three) times daily as needed.     benzonatate (TESSALON) 100 MG capsule Take 100 mg by mouth 3 (three) times daily as needed for cough.     Biotin 1000 MCG tablet Take 1,000 mcg by mouth daily.     calcitonin, salmon, (MIACALCIN/FORTICAL) 200 UNIT/ACT nasal spray Place 1 spray into alternate nostrils daily.     Calcium Carb-Cholecalciferol 600-800 MG-UNIT TABS Take 1 tablet by mouth daily.     daratumumab-hyaluronidase-fihj (DARZALEX FASPRO) 1800-30000 MG-UT/15ML SOLN Inject 1,800 mg into the skin once a week.     dexamethasone (DECADRON) 2 MG tablet Take 5 tablets (10 mg total) by mouth every 30 (thirty) days. 20 tablet 4   dextromethorphan-guaiFENesin (MUCINEX DM) 30-600 MG 12hr tablet Take 1 tablet by mouth daily.      estrogen, conjugated,-medroxyprogesterone (PREMPRO) 0.45-1.5 MG tablet Take 1 tablet by mouth daily.     feeding supplement (ENSURE ENLIVE / ENSURE PLUS) LIQD Take 237 mLs by mouth 2 (two) times daily between meals. (Patient taking differently: Take 237 mLs by mouth daily.) 237 mL 12   fluticasone  (FLONASE) 50 MCG/ACT nasal spray Place 1 spray into both nostrils daily.     furosemide (LASIX) 20 MG tablet TAKE 1 TABLET BY MOUTH EVERY DAY AS NEEDED 90 tablet 3   hydrochlorothiazide (HYDRODIURIL) 25 MG tablet Take 25 mg by mouth daily as needed (swelling).     HYDROcodone bit-homatropine (HYCODAN) 5-1.5 MG/5ML syrup Take 5 mLs by mouth every 6 (six) hours as needed for cough. 473 mL 0   Lactulose 20 GM/30ML SOLN Take 30 ml by mouth every 3 hours until bowel movement is had, then take 30 ml daily (Patient taking differently: Take 20 g by mouth daily as needed (constipation).) 946 mL 3   latanoprost (XALATAN) 0.005 % ophthalmic solution Place 1 drop into both eyes at bedtime.      lenalidomide (REVLIMID) 10 MG capsule Take 1 capsule (10 mg total) by mouth daily. 21 days on, 7 days off 21 capsule 0   linaclotide (LINZESS) 145 MCG CAPS capsule Take 145 mcg by mouth daily as needed (constipation).     lisinopril (ZESTRIL) 20 MG tablet Take 20 mg by mouth daily.     loratadine (CLARITIN) 10 MG tablet Take 10 mg by mouth daily.     loteprednol (LOTEMAX) 0.5 % ophthalmic suspension Place into the left eye.     methocarbamol (ROBAXIN) 500 MG tablet Take 1 tablet (500 mg total) by mouth every 6 (six) hours as needed for muscle spasms. 40 tablet 3   omeprazole (PRILOSEC) 20 MG capsule Take 20 mg by mouth daily.     ondansetron (ZOFRAN ODT) 4 MG disintegrating tablet Take 1 tablet (4 mg total) by mouth every 4 (four) hours as needed for nausea or vomiting. 20 tablet 6   Oxycodone HCl 10 MG TABS Take 1 tablet (10 mg total) by mouth every 6 (six) hours as needed. 120 tablet 0   pantoprazole (PROTONIX) 40 MG tablet Take 40 mg by mouth daily.     sucralfate (CARAFATE) 1 g tablet Take 1 g by mouth 4 (four) times daily.     SUMAtriptan (IMITREX)  100 MG tablet Take 100 mg by mouth every 2 (two) hours as needed for migraine. May repeat in 2 hours if headache persists or recurs.     tiZANidine (ZANAFLEX) 2 MG  tablet Take 2 mg by mouth every 6 (six) hours as needed.     UNABLE TO FIND Place 0.5 g into both eyes 3 (three) times daily. Med Name: Jearld Pies (otepredol)     No current facility-administered medications for this visit.    ALLERGIES:  Allergies  Allergen Reactions   Tobramycin-Dexamethasone Other (See Comments)    Itching and swelling   Ethambutol Other (See Comments)    Fever   Neomycin-Polymyxin-Dexameth Itching and Swelling   Amikacin Other (See Comments)    Eosinophilia with chills and aching when given with cefoxitin   Biaxin [Clarithromycin] Itching   Cefoxitin Other (See Comments)    Eosinophlia when given with amikacin   Sulfamethoxazole-Trimethoprim Itching   Vancomycin     "Got red splotches on skin after several doses"   Bacitracin-Polymyxin B Rash    PHYSICAL EXAM:  Performance status (ECOG): 1 - Symptomatic but completely ambulatory  There were no vitals filed for this visit.   Wt Readings from Last 3 Encounters:  06/26/22 115 lb 8.3 oz (52.4 kg)  06/02/22 117 lb (53.1 kg)  05/30/22 119 lb (54 kg)   Physical Exam Vitals reviewed.  Constitutional:      Appearance: Normal appearance.     Comments: In wheelchair  Cardiovascular:     Rate and Rhythm: Normal rate and regular rhythm.     Pulses: Normal pulses.     Heart sounds: Normal heart sounds.  Pulmonary:     Effort: Pulmonary effort is normal.     Breath sounds: Normal breath sounds.  Neurological:     General: No focal deficit present.     Mental Status: She is alert and oriented to person, place, and time.  Psychiatric:        Mood and Affect: Mood normal.        Behavior: Behavior normal.     LABORATORY DATA:  I have reviewed the labs as listed.     Latest Ref Rng & Units 06/26/2022   12:08 PM 06/02/2022    3:38 PM 05/30/2022    9:34 AM  CBC  WBC 4.0 - 10.5 K/uL 7.8  8.5  5.6   Hemoglobin 12.0 - 15.0 g/dL 11.7  11.6  11.5   Hematocrit 36.0 - 46.0 % 36.7  36.2  36.3   Platelets 150 -  400 K/uL 243  167  181       Latest Ref Rng & Units 06/26/2022   12:08 PM 06/02/2022    3:38 PM 05/30/2022    8:57 AM  CMP  Glucose 70 - 99 mg/dL 108  100  127   BUN 8 - 23 mg/dL '16  11  13   '$ Creatinine 0.44 - 1.00 mg/dL 0.74  0.81  0.83   Sodium 135 - 145 mmol/L 136  134  135   Potassium 3.5 - 5.1 mmol/L 4.6  4.5  3.8   Chloride 98 - 111 mmol/L 102  100  101   CO2 22 - 32 mmol/L '27  27  27   '$ Calcium 8.9 - 10.3 mg/dL 9.0  9.4  9.1   Total Protein 6.5 - 8.1 g/dL 7.2   6.6   Total Bilirubin 0.3 - 1.2 mg/dL 0.6   0.6   Alkaline Phos 38 - 126 U/L  62   59   AST 15 - 41 U/L 22   23   ALT 0 - 44 U/L 22   25     DIAGNOSTIC IMAGING:  I have independently reviewed the scans and discussed with the patient. No results found.   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 Wilburn Mylar - She reported discomfort in the left posterior back for the last 1 month.  She was seen by Dr. Chauncey Reading and x-rays showed abnormality.  A CT scan of the chest was done on 05/24/2021. - CT chest report reviewed by me from 05/24/2021 showed expansile oval-shaped lesion within the posterior aspect of the seventh rib with a nodule measuring 2.1 x 1.1 cm.  Adjacent cortex demonstrates area of thinning and destruction.  Stable emphysematous and interstitial changes within the lungs.  Stable pulmonary nodules within the right upper lobe and left upper lobe when compared to CT from September 2020. - CT of the abdomen and pelvis did not show any significant abnormalities. - She denies any fevers, night sweats or weight loss in the last 6 months.  She had history of basal cell carcinoma on the nose removed by Mohs surgery. - She also reported history of osteoporosis and broke left upper rib 1 year ago after sneezing. -Reviewed PET scan from 06/14/2021.  Mild hypermetabolism involving left aspect of the L5 vertebral body and also pedicle region surrounding the screw.   SUV 4.2.  There appears to be a lytic process on the CT scan.  The lytic left posterior seventh rib lesion is mildly hypermetabolic with SUV 9.81.  No other definite lytic hypermetabolic bone lesions seen. - We have reviewed left seventh rib bone biopsy from 06/25/2021 consistent with plasma cell neoplasm.  Cells are positive for CD138, CD56 and a kappa restricted.  - Labs on 06/13/2021 M spike 1.7 g.  Immunofixation IgA kappa.  Free light chain shows kappa light chain 71, ratio 8.22.  LDH and beta-2 microglobulin was normal. - Bone marrow biopsy on 07/04/2021 consistent with plasma cell neoplasm with 26% plasma cells on aspirate with atypical features and 50 to 60% on core biopsy.  Chromosome analysis was normal.  Multiple myeloma FISH panel shows loss of NF1/chromosome 17/17 q.  Gain of CCN D1 or chromosome 11/11 q.  Gain of chromosome 4/4P.  Loss of material from the long-arm of chromosome 13.  These are all standard risk features. - 24-hour urine total protein to 40 mg.  Urine immunofixation was negative.  Urine kappa light chains were 4.13. - 8 cycles of Dara RVD from 07/26/2021 through 03/01/2022 - T11 and T12 vertebroplasty by Dr. Reatha Armour on 08/31/2021. - She has decided against bone marrow transplant after the advice of Dr. Lorenda Cahill.   Social/family history: - She is married and lives at home with her husband.  She has not worked but had help her husband and his work.  She is a non-smoker. - Maternal grandmother had colon cancer.  Mother had a squamous cell carcinoma of the skin.   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  Free light chain ratio is 2.12. - Reviewed labs today which shows normal LFTs, creatinine and calcium.  CBC was normal. - Proceed with daratumumab today.  We have sent daratumumab specific immunofixation today.  RTC 4 weeks for follow-up.  She will start back next cycle  of Revlimid 10 mg 3 weeks on/1 week off on  08/06/2022.  2.  Mid back pain (s/p vertebroplasty of T11 and T12 by Dr. Reatha Armour on 08/31/2021): - Continue methocarbamol/baclofen/tizanidine as needed.  3.  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 Lorenda C[facility]ill at [facility]. - Respiratory symptoms are stable.  4.  Myeloma bone disease: - Calcium is 9.2.  Continue Zometa every 4 weeks.  5.  ID prophylaxis: - Recommend starting back on Valtrex due to increased risk of shingles.  She will start back Valtrex 500 mg once daily as twice daily dose is causing abdominal symptoms.   Orders placed this encounter:  No orders of the defined types were placed in this encounter.      Derek Jack, MD [place] [phone]

## 2022-07-30 NOTE — Progress Notes (Signed)
Pt presents today for Amanda Conner and Zometa '4mg'$  IV per provider's order. Vital signs and labs WNL for treatment. Okay to proceed with treatment today per Dr.K.  Pt took pre-meds at home prior to arrival. Peripheral IV started with good blood return pre and post infusion.  Amanda Conner and Zometa '4mg'$  IV given today per MD orders. Tolerated infusion without adverse affects. Vital signs stable. No complaints at this time. Discharged from clinic ambulatory in stable condition. Alert and oriented x 3. F/U with [facility] as scheduled.

## 2022-07-31 ENCOUNTER — Other Ambulatory Visit: Payer: Self-pay

## 2022-07-31 LAB — KAPPA/LAMBDA LIGHT CHAINS
Kappa free light chain: 15 mg/L (ref 3.3–19.4)
Kappa, lambda light chain ratio: 1.6 (ref 0.26–1.65)
Lambda free light chains: 9.4 mg/L (ref 5.7–26.3)

## 2022-08-01 LAB — PROTEIN ELECTROPHORESIS, SERUM
A/G Ratio: 1.8 — ABNORMAL HIGH (ref 0.7–1.7)
Albumin ELP: 4.1 g/dL (ref 2.9–4.4)
Alpha-1-Globulin: 0.3 g/dL (ref 0.0–0.4)
Alpha-2-Globulin: 0.6 g/dL (ref 0.4–1.0)
Beta Globulin: 0.9 g/dL (ref 0.7–1.3)
Gamma Globulin: 0.6 g/dL (ref 0.4–1.8)
Globulin, Total: 2.3 g/dL (ref 2.2–3.9)
Total Protein ELP: 6.4 g/dL (ref 6.0–8.5)

## 2022-08-02 ENCOUNTER — Other Ambulatory Visit: Payer: Self-pay

## 2022-08-05 LAB — MISC LABCORP TEST (SEND OUT): Labcorp test code: 123218

## 2022-08-07 ENCOUNTER — Other Ambulatory Visit: Payer: Self-pay

## 2022-08-27 ENCOUNTER — Other Ambulatory Visit: Payer: Self-pay

## 2022-08-27 MED ORDER — LENALIDOMIDE 10 MG PO CAPS: 10.0000 mg | ORAL_CAPSULE | Freq: Every day | ORAL | 0 refills | Status: DC

## 2022-08-27 NOTE — Telephone Encounter (Signed)
Chart reviewed. Revlimid refilled per last office note with Dr. Katragadda.  

## 2022-08-29 ENCOUNTER — Inpatient Hospital Stay: Payer: Medicare Other | Attending: Hematology

## 2022-08-29 ENCOUNTER — Inpatient Hospital Stay: Payer: Medicare Other

## 2022-08-29 ENCOUNTER — Inpatient Hospital Stay (HOSPITAL_BASED_OUTPATIENT_CLINIC_OR_DEPARTMENT_OTHER): Payer: Medicare Other | Admitting: Hematology

## 2022-08-29 VITALS — BP 128/67 | HR 58 | Temp 98.2°F | Resp 17 | Ht 61.0 in | Wt 112.4 lb

## 2022-08-29 DIAGNOSIS — Z85828 Personal history of other malignant neoplasm of skin: Secondary | ICD-10-CM | POA: Insufficient documentation

## 2022-08-29 DIAGNOSIS — Z808 Family history of malignant neoplasm of other organs or systems: Secondary | ICD-10-CM | POA: Diagnosis not present

## 2022-08-29 DIAGNOSIS — C9000 Multiple myeloma not having achieved remission: Secondary | ICD-10-CM

## 2022-08-29 DIAGNOSIS — R0602 Shortness of breath: Secondary | ICD-10-CM | POA: Diagnosis not present

## 2022-08-29 DIAGNOSIS — R197 Diarrhea, unspecified: Secondary | ICD-10-CM | POA: Insufficient documentation

## 2022-08-29 DIAGNOSIS — Z800 Family history of malignant neoplasm of digestive organs: Secondary | ICD-10-CM | POA: Diagnosis not present

## 2022-08-29 DIAGNOSIS — R110 Nausea: Secondary | ICD-10-CM | POA: Insufficient documentation

## 2022-08-29 DIAGNOSIS — R059 Cough, unspecified: Secondary | ICD-10-CM | POA: Insufficient documentation

## 2022-08-29 DIAGNOSIS — Z5112 Encounter for antineoplastic immunotherapy: Secondary | ICD-10-CM | POA: Insufficient documentation

## 2022-08-29 DIAGNOSIS — Z9221 Personal history of antineoplastic chemotherapy: Secondary | ICD-10-CM | POA: Insufficient documentation

## 2022-08-29 DIAGNOSIS — M5489 Other dorsalgia: Secondary | ICD-10-CM | POA: Diagnosis not present

## 2022-08-29 DIAGNOSIS — K5900 Constipation, unspecified: Secondary | ICD-10-CM | POA: Insufficient documentation

## 2022-08-29 DIAGNOSIS — Z79899 Other long term (current) drug therapy: Secondary | ICD-10-CM | POA: Diagnosis not present

## 2022-08-29 LAB — COMPREHENSIVE METABOLIC PANEL
ALT: 20 U/L (ref 0–44)
AST: 18 U/L (ref 15–41)
Albumin: 3.5 g/dL (ref 3.5–5.0)
Alkaline Phosphatase: 70 U/L (ref 38–126)
Anion gap: 7 (ref 5–15)
BUN: 12 mg/dL (ref 8–23)
CO2: 26 mmol/L (ref 22–32)
Calcium: 8.2 mg/dL — ABNORMAL LOW (ref 8.9–10.3)
Chloride: 97 mmol/L — ABNORMAL LOW (ref 98–111)
Creatinine, Ser: 0.69 mg/dL (ref 0.44–1.00)
GFR, Estimated: >60 mL/min (ref >60)
Glucose, Bld: 106 mg/dL — ABNORMAL HIGH (ref 70–99)
Potassium: 3.4 mmol/L — ABNORMAL LOW (ref 3.5–5.1)
Sodium: 130 mmol/L — ABNORMAL LOW (ref 135–145)
Total Bilirubin: 0.4 mg/dL (ref 0.3–1.2)
Total Protein: 6.4 g/dL — ABNORMAL LOW (ref 6.5–8.1)

## 2022-08-29 LAB — CBC WITH DIFFERENTIAL/PLATELET
Abs Immature Granulocytes: 0.04 10*3/uL (ref 0.00–0.07)
Basophils Absolute: 0 10*3/uL (ref 0.0–0.1)
Basophils Relative: 0 %
Eosinophils Absolute: 1 10*3/uL — ABNORMAL HIGH (ref 0.0–0.5)
Eosinophils Relative: 13 %
HCT: 34.4 % — ABNORMAL LOW (ref 36.0–46.0)
Hemoglobin: 11 g/dL — ABNORMAL LOW (ref 12.0–15.0)
Immature Granulocytes: 1 %
Lymphocytes Relative: 20 %
Lymphs Abs: 1.6 10*3/uL (ref 0.7–4.0)
MCH: 28.2 pg (ref 26.0–34.0)
MCHC: 32 g/dL (ref 30.0–36.0)
MCV: 88.2 fL (ref 80.0–100.0)
Monocytes Absolute: 0.9 10*3/uL (ref 0.1–1.0)
Monocytes Relative: 12 %
Neutro Abs: 4.4 10*3/uL (ref 1.7–7.7)
Neutrophils Relative %: 54 %
Platelets: 328 10*3/uL (ref 150–400)
RBC: 3.9 MIL/uL (ref 3.87–5.11)
RDW: 18.2 % — ABNORMAL HIGH (ref 11.5–15.5)
WBC: 8 10*3/uL (ref 4.0–10.5)
nRBC: 0 % (ref 0.0–0.2)

## 2022-08-29 LAB — MAGNESIUM: Magnesium: 1.8 mg/dL (ref 1.7–2.4)

## 2022-08-29 MED ORDER — SODIUM CHLORIDE 0.9 % IV SOLN: Freq: Once | INTRAVENOUS | Status: AC

## 2022-08-29 MED ORDER — ACETAMINOPHEN 325 MG PO TABS: 650.0000 mg | ORAL_TABLET | Freq: Once | ORAL | Status: DC

## 2022-08-29 MED ORDER — DIPHENHYDRAMINE HCL 25 MG PO CAPS: 50.0000 mg | ORAL_CAPSULE | Freq: Once | ORAL | Status: DC

## 2022-08-29 MED ORDER — ZOLEDRONIC ACID 4 MG/100ML IV SOLN
4.0000 mg | Freq: Once | INTRAVENOUS | Status: AC
  Administered 2022-08-29: 4 mg via INTRAVENOUS
  Filled 2022-08-29: qty 100

## 2022-08-29 MED ORDER — DEXAMETHASONE 4 MG PO TABS: 20.0000 mg | ORAL_TABLET | Freq: Once | ORAL | Status: DC

## 2022-08-29 MED ORDER — DARATUMUMAB-HYALURONIDASE-FIHJ 1800-30000 MG-UT/15ML ~~LOC~~ SOLN
1800.0000 mg | Freq: Once | SUBCUTANEOUS | Status: AC
  Administered 2022-08-29: 1800 mg via SUBCUTANEOUS
  Filled 2022-08-29: qty 15

## 2022-08-29 MED ORDER — ONDANSETRON 4 MG PO TBDP: 4.0000 mg | ORAL_TABLET | ORAL | 3 refills | Status: DC | PRN

## 2022-08-29 NOTE — Patient Instructions (Addendum)
[facility] at [facility] Discharge Instructions   You were seen and examined today by Dr. Delton Coombes.  He reviewed the results of your lab work which are normal/stable. Your myeloma labs from today are pending.  Your myeloma labs from 4 weeks ago are normal. Your m-spike was undetectable. We also sent a special test to eliminate the Darzalex from your immunofixation which was also normal. Your light chains are normal also.   We will proceed with your Darzalex injection and Zometa. Today will be your last Darzalex injection. We will continue Zometa every 4 weeks.   Continue Revlimid as prescribed.   Return as scheduled.    Thank you for choosing [place] at [facility] to provide your oncology and hematology care.  To afford each patient quality time with our provider, please arrive at least 15 minutes before your scheduled appointment time.   If you have a lab appointment with the [facility] please come in thru the Main Entrance and check in at the main information desk.  You need to re-schedule your appointment should you arrive 10 or more minutes late.  We strive to give you quality time with our providers, and arriving late affects you and other patients whose appointments are after yours.  Also, if you no show three or more times for appointments you may be dismissed from the clinic at the providers discretion.     Again, thank you for choosing [facility].  Our hope is that these requests will decrease the amount of time that you wait before being seen by our physicians.       _____________________________________________________________  Should you have questions after your visit to [facility], please contact our office at [phone] and follow the prompts.  Our office hours are 8:00 a.m. and 4:30 p.m. Monday - Friday.  Please note that voicemails left after 4:00 p.m. may not be returned until the  following business day.  We are closed weekends and major holidays.  You do have access to a nurse 24-7, just call the main number to the clinic [phone] and do not press any options, hold on the line and a nurse will answer the phone.    For prescription refill requests, have your pharmacy contact our office and allow 72 hours.    Due to Covid, you will need to wear a mask upon entering the hospital. If you do not have a mask, a mask will be given to you at the Main Entrance upon arrival. For doctor visits, patients may have 1 support person age 60 or older with them. For treatment visits, patients can not have anyone with them due to social distancing guidelines and our immunocompromised population.

## 2022-08-29 NOTE — Progress Notes (Signed)
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 Wilburn Mylar - She reported discomfort in the left posterior back for the last 1 month.  She was seen by Dr. Chauncey Reading and x-rays showed abnormality.  A CT scan of the chest was done on 05/24/2021. - CT chest report reviewed by me from 05/24/2021 showed expansile oval-shaped lesion within the posterior aspect of the seventh rib with a nodule measuring 2.1 x 1.1 cm.  Adjacent cortex demonstrates area of thinning and destruction.  Stable emphysematous and interstitial changes within the lungs.  Stable pulmonary nodules within the right upper lobe and left upper lobe when compared to CT from September 2020. - CT of the abdomen and pelvis did not show any significant abnormalities. - She denies any fevers, night sweats or weight loss in the last 6 months.  She had history of basal cell carcinoma on the nose removed by Mohs surgery. - She also reported history of osteoporosis and broke left upper rib 1 year ago after sneezing. -Reviewed PET scan from 06/14/2021.  Mild hypermetabolism involving left aspect of the L5 vertebral body and also pedicle region surrounding the screw.  SUV 4.2.  There appears to be a lytic process on the CT scan.  The lytic left posterior seventh rib lesion is mildly hypermetabolic with SUV [id number].  No other definite lytic hypermetabolic bone lesions seen. - We have reviewed left seventh rib bone biopsy from 06/25/2021 consistent with plasma cell neoplasm.  Cells are positive for CD138, CD56 and a kappa restricted.  - Labs on 06/13/2021 M spike 1.7 g.  Immunofixation  IgA kappa.  Free light chain shows kappa light chain 71, ratio 8.22.  LDH and beta-2 microglobulin was normal. - Bone marrow biopsy on 07/04/2021 consistent with plasma cell neoplasm with 26% plasma cells on aspirate with atypical features and 50 to 60% on core biopsy.  Chromosome analysis was normal.  Multiple myeloma FISH panel shows loss of NF1/chromosome 17/17 q.  Gain of CCN D1 or chromosome 11/11 q.  Gain of chromosome 4/4P.  Loss of material from the long-arm of chromosome 13.  These are all standard risk features. - 24-hour urine total protein to 40 mg.  Urine immunofixation was negative.  Urine kappa light chains were 4.13. - 8 cycles of Dara RVD from 07/26/2021 through 03/01/2022 - T11 and T12 vertebroplasty by Dr. Reatha Armour on 08/31/2021. - She has decided against bone marrow transplant after the advice of Dr. Lorenda Cahill.   Social/family history: - She is married and lives at home with her husband.  She has not worked but had help her husband and his work.  She is a non-smoker. - Maternal grandmother had colon cancer.  Mother had a squamous cell carcinoma of the skin.    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  M spike not detected.  Free light chain ratio is normal at 1.6.  Daratumumab specific immunofixation is normal. - Labs today shows normal CBC.  LFTs are normal.  Creatinine is normal.  She will proceed with the last dose of Darzalex today.  I  will stop Darzalex after today. - She will continue Revlimid 10 mg 3 weeks on/1 week off. - RTC 8 weeks for follow-up.  2.  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  Smear for Mycobacterium abscessus was reportedly positive. - She will continue follow-up with Dr. Lorenda Cahill at [facility].  3.  ID prophylaxis: - She is reluctant to start Valtrex due to side effects.  We are discontinuing Darzalex.  Hence no need to  start back on Valtrex.  4.  Myeloma bone disease: - Calcium is normal today.  Proceed with Zometa today and every 4 weeks.    No orders of the defined types were placed in this encounter.     Beverly Gust Oliver,acting as a scribe for Derek Jack, MD.,have documented all relevant documentation on the behalf of Derek Jack, MD,as directed by  Derek Jack, MD while in the presence of Derek Jack, MD.   I, Derek Jack MD, have reviewed the above documentation for accuracy and completeness, and I agree with the above.   Derek Jack, MD   2/15/20246:30 PM  CHIEF COMPLAINT:   Diagnosis: multiple myeloma    Cancer Staging  Multiple myeloma ([place]) Staging form: Multiple Myeloma, AJCC 6th Edition - Clinical stage from 07/17/2021: Stage IA - Unsigned    Prior Therapy:  DaraVRd (Daratumumab SQ) q21d x 6 Cycles (Induction/Consolidation)   Current Therapy: Revlimid maintenance   HISTORY OF PRESENT ILLNESS:   Oncology History  Multiple myeloma ([place])  07/17/2021 Initial Diagnosis   Multiple myeloma ([place])   07/26/2021 - 03/15/2022 Chemotherapy   Patient is on Treatment Plan : MYELOMA NEWLY DIAGNOSED TRANSPLANT CANDIDATE DaraVRd (Daratumumab SQ) q21d x 6 Cycles (Induction/Consolidation)     04/17/2022 -  Chemotherapy   Patient is on Treatment Plan : MYELOMA NEWLY DIAGNOSED TRANSPLANT CANDIDATE Daratumumab SQ + Lenalidomide q28d (Maintenance)        INTERVAL HISTORY:   Amanda Conner is a 72 y.o. female presenting to clinic today for follow up of multiple myeloma . She was last seen by me on 07/30/2022.  She had a bronchoscopy 08/13/2022.   Today, she states that she is doing well overall. Her appetite level is at 75%. Her energy level is at 0%. She has 5/10 back pain.She occasionally feel nauseas.  She has lost 3lbs since her last visit. She has had a lot of coughing recently and has been sore in the ribs area.She currently has dry flaky  skin.   PAST MEDICAL HISTORY:   Past Medical History: Past Medical History:  Diagnosis Date   Arthritis    osteoarthritis   Back pain    Bronchiectasis (HCC)    Cancer (HCC)    basal cell nose   Chronic cough    Complication of anesthesia    Dyspnea    Gastrointestinal symptoms    GERD (gastroesophageal reflux disease)    Glaucoma    History of hiatal hernia    Hyperlipemia    Hypertension    Multiple myeloma (HCC)    Osteopenia    Pneumonia    PONV (postoperative nausea and vomiting)    "only one time"   Spondylisthesis     Surgical History: Past Surgical History:  Procedure Laterality Date   ABDOMINAL EXPOSURE N/A 12/29/2018   Procedure: ABDOMINAL EXPOSURE;  Surgeon: Angelia Mould, MD;  Location: [facility] OR;  Service: Vascular;  Laterality: N/A;   ABDOMINOPLASTY  2002   ABDOMINOPLASTY  1970's  ANKLE SURGERY Left 10/2015   ANTERIOR LATERAL LUMBAR FUSION WITH PERCUTANEOUS SCREW 3 LEVEL Left 12/29/2018   Procedure: Lumbar Two to Lumbar Five Anterolateral lumbar interbody fusion;  Surgeon: Erline Levine, MD;  Location: [place];  Service: Neurosurgery;  Laterality: Left;  anterolateral   ANTERIOR LUMBAR FUSION N/A 12/29/2018   Procedure: Lumbar Five Sacral One Anterior lumbar interbody fusion;  Surgeon: Erline Levine, MD;  Location: [place];  Service: Neurosurgery;  Laterality: N/A;  anterior approach   BASAL CELL CARCINOMA EXCISION  06/2018   nose   BLEPHAROPLASTY Bilateral 1999   BREAST ENHANCEMENT SURGERY  1970's   COLONOSCOPY     CYSTOURETHROSCOPY  2018   [place] J ureteal stents   DECOMPRESSION CORE HIP Left 2014   FACIAL COSMETIC SURGERY  2004   FLUOROSCOPY GUIDANCE     HIP ARTHROPLASTY Left    INCISIONAL HERNIA REPAIR N/A 10/08/2019   Procedure: OPEN INCISIONAL HERNIA REPAIR WITH MESH;  Surgeon: Armandina Gemma, MD;  Location: [facility];  Service: General;  Laterality: N/A;   JOINT REPLACEMENT Left    Hip; 05/2014   KYPHOPLASTY N/A 08/31/2021   Procedure:  KYPHOPLASTY THORACIC ELEVEN, THORACIC TWELVE;  Surgeon: Karsten Ro, DO;  Location: [place];  Service: Neurosurgery;  Laterality: N/A;   LAPAROSCOPIC PARTIAL COLECTOMY  06/26/2017   LUMBAR PERCUTANEOUS PEDICLE SCREW 4 LEVEL N/A 12/29/2018   Procedure: Lumbar Percutaneous Pedicle Screw Placement Lumbar two-Sacral one;  Surgeon: Erline Levine, MD;  Location: [place];  Service: Neurosurgery;  Laterality: N/A;   MOHS SURGERY  2019   nose   PARTIAL COLECTOMY  06/2017   for diverticulitis   REMOVAL OF BILATERAL TISSUE EXPANDERS WITH PLACEMENT OF BILATERAL BREAST IMPLANTS  2004   THIGH LIFT  1994   TOE FUSION Right    great toe   TONSILLECTOMY     UMBILICAL HERNIA REPAIR     with tummy tuck   UMBILICAL HERNIA REPAIR  2002    Social History: Social History   Socioeconomic History   Marital status: Married    Spouse name: Not on file   Number of children: Not on file   Years of education: Not on file   Highest education level: Not on file  Occupational History   Not on file  Tobacco Use   Smoking status: Never   Smokeless tobacco: Never  Vaping Use   Vaping Use: Never used  Substance and Sexual Activity   Alcohol use: Yes    Comment: occasional   Drug use: Never   Sexual activity: Not on file  Other Topics Concern   Not on file  Social History Narrative   Not on file   Social Determinants of Health   Financial Resource Strain: Low Risk  (07/24/2021)   Overall Financial Resource Strain (CARDIA)    Difficulty of Paying Living Expenses: Not hard at all  Food Insecurity: No Food Insecurity (07/24/2021)   Hunger Vital Sign    Worried About Running Out of Food in the Last Year: Never true    [place] in the Last Year: Never true  Transportation Needs: No Transportation Needs (07/24/2021)   PRAPARE - Hydrologist (Medical): No    Lack of Transportation (Non-Medical): No  Physical Activity: Not on file  Stress: Not on file  Social Connections:  Socially Integrated (07/24/2021)   Social Connection and Isolation Panel [NHANES]    Frequency of Communication with Friends and Family: More than three times  a week    Frequency of Social Gatherings with Friends and Family: More than three times a week    Attends Religious Services: 1 to 4 times per year    Active Member of [employer] or Organizations: No    Attends Music therapist: 1 to 4 times per year    Marital Status: Married  Human resources officer Violence: Not on file    Family History: Family History  Problem Relation Age of Onset   Hypertension Mother    COPD Mother    Hypertension Father    Heart disease Father     Current Medications:  Current Outpatient Medications:    albuterol (VENTOLIN HFA) 108 (90 Base) MCG/ACT inhaler, Inhale 2 puffs into the lungs every 6 (six) hours as needed for wheezing or shortness of breath., Disp: , Rfl:    aspirin EC 81 MG tablet, Take 81 mg by mouth daily. Swallow whole., Disp: , Rfl:    Baclofen 5 MG TABS, Take 1 tablet by mouth 3 (three) times daily as needed., Disp: , Rfl:    benzonatate (TESSALON) 100 MG capsule, Take 100 mg by mouth 3 (three) times daily as needed for cough., Disp: , Rfl:    Biotin 1000 MCG tablet, Take 1,000 mcg by mouth daily., Disp: , Rfl:    calcitonin, salmon, (MIACALCIN/FORTICAL) 200 UNIT/ACT nasal spray, Place 1 spray into alternate nostrils daily., Disp: , Rfl:    Calcium Carb-Cholecalciferol 600-800 MG-UNIT TABS, Take 1 tablet by mouth daily., Disp: , Rfl:    daratumumab-hyaluronidase-fihj (DARZALEX FASPRO) 1800-30000 MG-UT/15ML SOLN, Inject 1,800 mg into the skin once a week., Disp: , Rfl:    dexamethasone (DECADRON) 2 MG tablet, Take 5 tablets (10 mg total) by mouth every 30 (thirty) days., Disp: 20 tablet, Rfl: 4   dextromethorphan-guaiFENesin (MUCINEX DM) 30-600 MG 12hr tablet, Take 1 tablet by mouth daily. , Disp: , Rfl:    estrogen, conjugated,-medroxyprogesterone (PREMPRO) 0.45-1.5 MG tablet, Take 1  tablet by mouth daily., Disp: , Rfl:    feeding supplement (ENSURE ENLIVE / ENSURE PLUS) LIQD, Take 237 mLs by mouth 2 (two) times daily between meals. (Patient taking differently: Take 237 mLs by mouth daily.), Disp: 237 mL, Rfl: 12   fluticasone (FLONASE) 50 MCG/ACT nasal spray, Place 1 spray into both nostrils daily., Disp: , Rfl:    furosemide (LASIX) 20 MG tablet, TAKE 1 TABLET BY MOUTH EVERY DAY AS NEEDED, Disp: 90 tablet, Rfl: 3   hydrochlorothiazide (HYDRODIURIL) 25 MG tablet, Take 25 mg by mouth daily as needed (swelling)., Disp: , Rfl:    HYDROcodone bit-homatropine (HYCODAN) 5-1.5 MG/5ML syrup, Take 5 mLs by mouth every 6 (six) hours as needed for cough., Disp: 473 mL, Rfl: 0   Lactulose 20 GM/30ML SOLN, Take 30 ml by mouth every 3 hours until bowel movement is had, then take 30 ml daily (Patient taking differently: Take 20 g by mouth daily as needed (constipation).), Disp: 946 mL, Rfl: 3   latanoprost (XALATAN) 0.005 % ophthalmic solution, Place 1 drop into both eyes at bedtime. , Disp: , Rfl:    lenalidomide (REVLIMID) 10 MG capsule, Take 1 capsule (10 mg total) by mouth daily. 21 days on, 7 days off, Disp: 21 capsule, Rfl: 0   linaclotide (LINZESS) 145 MCG CAPS capsule, Take 145 mcg by mouth daily as needed (constipation)., Disp: , Rfl:    lisinopril (ZESTRIL) 20 MG tablet, Take 20 mg by mouth daily., Disp: , Rfl:    loratadine (CLARITIN) 10 MG tablet,  Take 10 mg by mouth daily., Disp: , Rfl:    loteprednol (LOTEMAX) 0.5 % ophthalmic suspension, Place into the left eye., Disp: , Rfl:    methocarbamol (ROBAXIN) 500 MG tablet, Take 1 tablet (500 mg total) by mouth every 6 (six) hours as needed for muscle spasms., Disp: 40 tablet, Rfl: 3   omeprazole (PRILOSEC) 20 MG capsule, Take 20 mg by mouth daily., Disp: , Rfl:    Oxycodone HCl 10 MG TABS, Take 1 tablet (10 mg total) by mouth every 6 (six) hours as needed., Disp: 120 tablet, Rfl: 0   pantoprazole (PROTONIX) 40 MG tablet, Take 40 mg by  mouth daily., Disp: , Rfl:    sucralfate (CARAFATE) 1 g tablet, Take 1 g by mouth 4 (four) times daily., Disp: , Rfl:    SUMAtriptan (IMITREX) 100 MG tablet, Take 100 mg by mouth every 2 (two) hours as needed for migraine. May repeat in 2 hours if headache persists or recurs., Disp: , Rfl:    tiZANidine (ZANAFLEX) 2 MG tablet, Take 2 mg by mouth every 6 (six) hours as needed., Disp: , Rfl:    UNABLE TO FIND, Place 0.5 g into both eyes 3 (three) times daily. Med Name: Loteman (otepredol), Disp: , Rfl:    ondansetron (ZOFRAN ODT) 4 MG disintegrating tablet, Take 1 tablet (4 mg total) by mouth every 4 (four) hours as needed for nausea or vomiting., Disp: 60 tablet, Rfl: 3   Allergies: Allergies  Allergen Reactions   Tobramycin-Dexamethasone Other (See Comments)    Itching and swelling   Ethambutol Other (See Comments)    Fever   Neomycin-Polymyxin-Dexameth Itching and Swelling   Amikacin Other (See Comments)    Eosinophilia with chills and aching when given with cefoxitin   Biaxin [Clarithromycin] Itching   Cefoxitin Other (See Comments)    Eosinophlia when given with amikacin   Sulfamethoxazole-Trimethoprim Itching   Vancomycin     "Got red splotches on skin after several doses"   Bacitracin-Polymyxin B Rash    REVIEW OF SYSTEMS:   Review of Systems  Constitutional:  Negative for chills, fatigue and fever.  HENT:   Negative for lump/mass, mouth sores, nosebleeds, sore throat and trouble swallowing.   Eyes:  Negative for eye problems.  Respiratory:  Positive for cough and shortness of breath.   Cardiovascular:  Negative for chest pain, leg swelling and palpitations.  Gastrointestinal:  Positive for constipation, diarrhea and nausea. Negative for abdominal pain and vomiting.  Genitourinary:  Negative for bladder incontinence, difficulty urinating, dysuria, frequency, hematuria and nocturia.   Musculoskeletal:  Negative for arthralgias, back pain, flank pain, myalgias and neck pain.   Skin:  Negative for itching and rash.  Neurological:  Negative for dizziness, headaches and numbness.  Hematological:  Does not bruise/bleed easily.  Psychiatric/Behavioral:  Negative for depression, sleep disturbance and suicidal ideas. The patient is not nervous/anxious.   All other systems reviewed and are negative.    VITALS:   Blood pressure 128/67, pulse (!) 58, temperature 98.2 F (36.8 C), temperature source Oral, resp. rate 17, height 5' 1"$  (1.549 m), weight 112 lb 6.4 oz (51 kg), SpO2 96 %.  Wt Readings from Last 3 Encounters:  08/29/22 112 lb 6.4 oz (51 kg)  07/30/22 115 lb 1.6 oz (52.2 kg)  06/26/22 115 lb 8.3 oz (52.4 kg)    Body mass index is 21.24 kg/m.  Performance status (ECOG): 1 - Symptomatic but completely ambulatory  PHYSICAL EXAM:   Physical Exam Vitals and  nursing note reviewed. Exam conducted with a chaperone present.  Constitutional:      Appearance: Normal appearance.  Cardiovascular:     Rate and Rhythm: Normal rate and regular rhythm.     Pulses: Normal pulses.     Heart sounds: Normal heart sounds.  Pulmonary:     Effort: Pulmonary effort is normal.     Breath sounds: Normal breath sounds.  Abdominal:     Palpations: Abdomen is soft. There is no hepatomegaly, splenomegaly or mass.     Tenderness: There is no abdominal tenderness.  Musculoskeletal:     Right lower leg: No edema.     Left lower leg: No edema.  Lymphadenopathy:     Cervical: No cervical adenopathy.     Right cervical: No superficial, deep or posterior cervical adenopathy.    Left cervical: No superficial, deep or posterior cervical adenopathy.     Upper Body:     Right upper body: No supraclavicular or axillary adenopathy.     Left upper body: No supraclavicular or axillary adenopathy.  Neurological:     General: No focal deficit present.     Mental Status: She is alert and oriented to person, place, and time.  Psychiatric:        Mood and Affect: Mood normal.         Behavior: Behavior normal.     LABS:      Latest Ref Rng & Units 08/29/2022    9:26 AM 07/30/2022    9:32 AM 06/26/2022   12:08 PM  CBC  WBC 4.0 - 10.5 K/uL 8.0  5.7  7.8   Hemoglobin 12.0 - 15.0 g/dL 11.0  12.2  11.7   Hematocrit 36.0 - 46.0 % 34.4  37.2  36.7   Platelets 150 - 400 K/uL 328  175  243       Latest Ref Rng & Units 08/29/2022    9:26 AM 07/30/2022    9:32 AM 06/26/2022   12:08 PM  CMP  Glucose 70 - 99 mg/dL 106  107  108   BUN 8 - 23 mg/dL 12  18  16   $ Creatinine 0.44 - 1.00 mg/dL 0.69  0.83  0.74   Sodium 135 - 145 mmol/L 130  135  136   Potassium 3.5 - 5.1 mmol/L 3.4  3.7  4.6   Chloride 98 - 111 mmol/L 97  99  102   CO2 22 - 32 mmol/L 26  26  27   $ Calcium 8.9 - 10.3 mg/dL 8.2  9.2  9.0   Total Protein 6.5 - 8.1 g/dL 6.4  6.8  7.2   Total Bilirubin 0.3 - 1.2 mg/dL 0.4  0.4  0.6   Alkaline Phos 38 - 126 U/L 70  64  62   AST 15 - 41 U/L 18  22  22   $ ALT 0 - 44 U/L 20  24  22      $ No results found for: "CEA1", "CEA" / No results found for: "CEA1", "CEA" No results found for: "PSA1" No results found for: "CAN199" No results found for: "CAN125"  Lab Results  Component Value Date   TOTALPROTELP 6.4 07/30/2022   ALBUMINELP 4.1 07/30/2022   A1GS 0.3 07/30/2022   A2GS 0.6 07/30/2022   BETS 0.9 07/30/2022   GAMS 0.6 07/30/2022   MSPIKE Not Observed 07/30/2022   SPEI Comment 07/30/2022   No results found for: "TIBC", "FERRITIN", "IRONPCTSAT" Lab Results  Component Value Date  LDH 162 04/17/2022   LDH 140 03/15/2022   LDH 143 02/14/2022     STUDIES:   No results found.

## 2022-08-29 NOTE — Progress Notes (Signed)
Patient presents today for Darzalex Faspro and Zometa infusion. Vital signs stable. Labs within parameters for treatment. Patient took pre-medications prior to arrival at 0800 am.   Treatment given today per MD orders. Tolerated infusion without adverse affects. Vital signs stable. No complaints at this time. Discharged from clinic ambulatory in stable condition. Alert and oriented x 3. F/U with [facility] as scheduled.

## 2022-08-29 NOTE — Patient Instructions (Signed)
[facility]  Discharge Instructions: Thank you for choosing [facility] to provide your oncology and hematology care.  If you have a lab appointment with the Cancer Center, please come in thru the Main Entrance and check in at the main information desk.  Wear comfortable clothing and clothing appropriate for easy access to any Portacath or PICC line.   We strive to give you quality time with your provider. You may need to reschedule your appointment if you arrive late (15 or more minutes).  Arriving late affects you and other patients whose appointments are after yours.  Also, if you miss three or more appointments without notifying the office, you may be dismissed from the clinic at the provider's discretion.      For prescription refill requests, have your pharmacy contact our office and allow 72 hours for refills to be completed.    Today you received the following chemotherapy and/or immunotherapy agents Darzalex Faspro. Daratumumab; Hyaluronidase Injection What is this medication? DARATUMUMAB; HYALURONIDASE (dar a toom ue mab; hye al ur ON i dase) treats multiple myeloma, a type of bone marrow cancer. Daratumumab works by blocking a protein that causes cancer cells to grow and multiply. This helps to slow or stop the spread of cancer cells. Hyaluronidase works by increasing the absorption of other medications in the body to help them work better. This medication may also be used treat amyloidosis, a condition that causes the buildup of a protein (amyloid) in your body. It works by reducing the buildup of this protein, which decreases symptoms. It is a combination medication that contains a monoclonal antibody. This medicine may be used for other purposes; ask your health care provider or pharmacist if you have questions. COMMON BRAND NAME(S): DARZALEX FASPRO What should I tell my care team before I take this medication? They need to know if you have any of  these conditions: Heart disease Infection, such as chickenpox, cold sores, herpes, hepatitis B Lung or breathing disease An unusual or allergic reaction to daratumumab, hyaluronidase, other medications, foods, dyes, or preservatives Pregnant or trying to get pregnant Breast-feeding How should I use this medication? This medication is injected under the skin. It is given by your care team in a hospital or clinic setting. Talk to your care team about the use of this medication in children. Special care may be needed. Overdosage: If you think you have taken too much of this medicine contact a poison control center or emergency room at once. NOTE: This medicine is only for you. Do not share this medicine with others. What if I miss a dose? Keep appointments for follow-up doses. It is important not to miss your dose. Call your care team if you are unable to keep an appointment. What may interact with this medication? Interactions have not been studied. This list may not describe all possible interactions. Give your health care provider a list of all the medicines, herbs, non-prescription drugs, or dietary supplements you use. Also tell them if you smoke, drink alcohol, or use illegal drugs. Some items may interact with your medicine. What should I watch for while using this medication? Your condition will be monitored carefully while you are receiving this medication. This medication can cause serious allergic reactions. To reduce your risk, your care team may give you other medication to take before receiving this one. Be sure to follow the directions from your care team. This medication can affect the results of blood tests to match   your blood type. These changes can last for up to 6 months after the final dose. Your care team will do blood tests to match your blood type before you start treatment. Tell all of your care team that you are being treated with this medication before receiving a blood  transfusion. This medication can affect the results of some tests used to determine treatment response; extra tests may be needed to evaluate response. Talk to your care team if you wish to become pregnant or think you are pregnant. This medication can cause serious birth defects if taken during pregnancy and for 3 months after the last dose. A reliable form of contraception is recommended while taking this medication and for 3 months after the last dose. Talk to your care team about effective forms of contraception. Do not breast-feed while taking this medication. What side effects may I notice from receiving this medication? Side effects that you should report to your care team as soon as possible: Allergic reactions--skin rash, itching, hives, swelling of the face, lips, tongue, or throat Heart rhythm changes--fast or irregular heartbeat, dizziness, feeling faint or lightheaded, chest pain, trouble breathing Infection--fever, chills, cough, sore throat, wounds that don't heal, pain or trouble when passing urine, general feeling of discomfort or being unwell Infusion reactions--chest pain, shortness of breath or trouble breathing, feeling faint or lightheaded Sudden eye pain or change in vision such as blurry vision, seeing halos around lights, vision loss Unusual bruising or bleeding Side effects that usually do not require medical attention (report to your care team if they continue or are bothersome): Constipation Diarrhea Fatigue Nausea Pain, tingling, or numbness in the hands or feet Swelling of the ankles, hands, or feet This list may not describe all possible side effects. Call your doctor for medical advice about side effects. You may report side effects to FDA at 1-800-FDA-1088. Where should I keep my medication? This medication is given in a hospital or clinic. It will not be stored at home. NOTE: This sheet is a summary. It may not cover all possible information. If you have  questions about this medicine, talk to your doctor, pharmacist, or health care provider.  2023 Elsevier/Gold Standard (2021-10-24 00:00:00)       To help prevent nausea and vomiting after your treatment, we encourage you to take your nausea medication as directed.  BELOW ARE SYMPTOMS THAT SHOULD BE REPORTED IMMEDIATELY: *FEVER GREATER THAN 100.4 F (38 C) OR HIGHER *CHILLS OR SWEATING *NAUSEA AND VOMITING THAT IS NOT CONTROLLED WITH YOUR NAUSEA MEDICATION *UNUSUAL SHORTNESS OF BREATH *UNUSUAL BRUISING OR BLEEDING *URINARY PROBLEMS (pain or burning when urinating, or frequent urination) *BOWEL PROBLEMS (unusual diarrhea, constipation, pain near the anus) TENDERNESS IN MOUTH AND THROAT WITH OR WITHOUT PRESENCE OF ULCERS (sore throat, sores in mouth, or a toothache) UNUSUAL RASH, SWELLING OR PAIN  UNUSUAL VAGINAL DISCHARGE OR ITCHING   Items with * indicate a potential emergency and should be followed up as soon as possible or go to the Emergency Department if any problems should occur.  Please show the CHEMOTHERAPY ALERT CARD or IMMUNOTHERAPY ALERT CARD at check-in to the Emergency Department and triage nurse.  Should you have questions after your visit or need to cancel or reschedule your appointment, please contact [facility] AT  [phone]  and follow the prompts.  Office hours are 8:00 a.m. to 4:30 p.m. Monday - Friday. Please note that voicemails left after 4:00 p.m. may not be returned until the following business day.    We are closed weekends and major holidays. You have access to a nurse at all times for urgent questions. Please call the main number to the clinic [phone] and follow the prompts.  For any non-urgent questions, you may also contact your provider using MyChart. We now offer e-Visits for anyone 18 and older to request care online for non-urgent symptoms. For details visit [email or web address].   Also download the MyChart app! Go to the app  store, search "MyChart", open the app, select [facility], and log in with your MyChart username and password.   

## 2022-08-29 NOTE — Progress Notes (Signed)
Patient is taking Revlimid as prescribed.  She has not missed any doses and reports no side effects at this time.    Patient has been examined by Dr. Katragadda, and vital signs and labs have been reviewed. ANC, Creatinine, LFTs, hemoglobin, and platelets are within treatment parameters per M.D. - pt may proceed with treatment.  Primary RN and pharmacy notified.  

## 2022-08-30 ENCOUNTER — Other Ambulatory Visit: Payer: Self-pay

## 2022-08-30 LAB — KAPPA/LAMBDA LIGHT CHAINS
Kappa free light chain: 14.7 mg/L (ref 3.3–19.4)
Kappa, lambda light chain ratio: 1.53 (ref 0.26–1.65)
Lambda free light chains: 9.6 mg/L (ref 5.7–26.3)

## 2022-09-02 LAB — PROTEIN ELECTROPHORESIS, SERUM
A/G Ratio: 1.5 (ref 0.7–1.7)
Albumin ELP: 3.5 g/dL (ref 2.9–4.4)
Alpha-1-Globulin: 0.3 g/dL (ref 0.0–0.4)
Alpha-2-Globulin: 0.7 g/dL (ref 0.4–1.0)
Beta Globulin: 0.8 g/dL (ref 0.7–1.3)
Gamma Globulin: 0.5 g/dL (ref 0.4–1.8)
Globulin, Total: 2.3 g/dL (ref 2.2–3.9)
Total Protein ELP: 5.8 g/dL — ABNORMAL LOW (ref 6.0–8.5)

## 2022-09-05 ENCOUNTER — Other Ambulatory Visit: Payer: Self-pay

## 2022-09-19 ENCOUNTER — Other Ambulatory Visit: Payer: Self-pay

## 2022-09-19 MED ORDER — LENALIDOMIDE 10 MG PO CAPS: 10.0000 mg | ORAL_CAPSULE | Freq: Every day | ORAL | 0 refills | Status: DC

## 2022-09-19 NOTE — Telephone Encounter (Signed)
Chart reviewed. Revlimid refilled per last office note with Dr. Katragadda.  

## 2022-09-26 ENCOUNTER — Inpatient Hospital Stay: Payer: Medicare Other

## 2022-09-27 ENCOUNTER — Inpatient Hospital Stay: Payer: Medicare Other | Attending: Hematology

## 2022-09-27 ENCOUNTER — Inpatient Hospital Stay: Payer: Medicare Other | Admitting: Hematology

## 2022-09-27 VITALS — BP 141/73 | HR 65 | Temp 98.0°F | Resp 17 | Wt 111.9 lb

## 2022-09-27 DIAGNOSIS — C9000 Multiple myeloma not having achieved remission: Secondary | ICD-10-CM

## 2022-09-27 DIAGNOSIS — R059 Cough, unspecified: Secondary | ICD-10-CM | POA: Insufficient documentation

## 2022-09-27 DIAGNOSIS — M5489 Other dorsalgia: Secondary | ICD-10-CM | POA: Diagnosis not present

## 2022-09-27 DIAGNOSIS — Z79899 Other long term (current) drug therapy: Secondary | ICD-10-CM | POA: Diagnosis not present

## 2022-09-27 DIAGNOSIS — Z800 Family history of malignant neoplasm of digestive organs: Secondary | ICD-10-CM | POA: Diagnosis not present

## 2022-09-27 DIAGNOSIS — Z85828 Personal history of other malignant neoplasm of skin: Secondary | ICD-10-CM | POA: Diagnosis not present

## 2022-09-27 DIAGNOSIS — Z808 Family history of malignant neoplasm of other organs or systems: Secondary | ICD-10-CM | POA: Diagnosis not present

## 2022-09-27 DIAGNOSIS — R110 Nausea: Secondary | ICD-10-CM | POA: Diagnosis not present

## 2022-09-27 DIAGNOSIS — R197 Diarrhea, unspecified: Secondary | ICD-10-CM | POA: Diagnosis not present

## 2022-09-27 DIAGNOSIS — K5900 Constipation, unspecified: Secondary | ICD-10-CM | POA: Diagnosis not present

## 2022-09-27 DIAGNOSIS — R0602 Shortness of breath: Secondary | ICD-10-CM | POA: Diagnosis not present

## 2022-09-27 DIAGNOSIS — Z9221 Personal history of antineoplastic chemotherapy: Secondary | ICD-10-CM | POA: Insufficient documentation

## 2022-09-27 LAB — COMPREHENSIVE METABOLIC PANEL
ALT: 20 U/L (ref 0–44)
AST: 20 U/L (ref 15–41)
Albumin: 3.9 g/dL (ref 3.5–5.0)
Alkaline Phosphatase: 65 U/L (ref 38–126)
Anion gap: 10 (ref 5–15)
BUN: 12 mg/dL (ref 8–23)
CO2: 27 mmol/L (ref 22–32)
Calcium: 8.7 mg/dL — ABNORMAL LOW (ref 8.9–10.3)
Chloride: 100 mmol/L (ref 98–111)
Creatinine, Ser: 0.8 mg/dL (ref 0.44–1.00)
GFR, Estimated: >60 mL/min (ref >60)
Glucose, Bld: 91 mg/dL (ref 70–99)
Potassium: 3.9 mmol/L (ref 3.5–5.1)
Sodium: 137 mmol/L (ref 135–145)
Total Bilirubin: 0.5 mg/dL (ref 0.3–1.2)
Total Protein: 6.5 g/dL (ref 6.5–8.1)

## 2022-09-27 LAB — CBC WITH DIFFERENTIAL/PLATELET
Abs Immature Granulocytes: 0.01 10*3/uL (ref 0.00–0.07)
Basophils Absolute: 0.1 10*3/uL (ref 0.0–0.1)
Basophils Relative: 2 %
Eosinophils Absolute: 1.2 10*3/uL — ABNORMAL HIGH (ref 0.0–0.5)
Eosinophils Relative: 21 %
HCT: 36.5 % (ref 36.0–46.0)
Hemoglobin: 11.5 g/dL — ABNORMAL LOW (ref 12.0–15.0)
Immature Granulocytes: 0 %
Lymphocytes Relative: 23 %
Lymphs Abs: 1.3 10*3/uL (ref 0.7–4.0)
MCH: 28 pg (ref 26.0–34.0)
MCHC: 31.5 g/dL (ref 30.0–36.0)
MCV: 89 fL (ref 80.0–100.0)
Monocytes Absolute: 0.7 10*3/uL (ref 0.1–1.0)
Monocytes Relative: 12 %
Neutro Abs: 2.4 10*3/uL (ref 1.7–7.7)
Neutrophils Relative %: 42 %
Platelets: 273 10*3/uL (ref 150–400)
RBC: 4.1 MIL/uL (ref 3.87–5.11)
RDW: 18.1 % — ABNORMAL HIGH (ref 11.5–15.5)
WBC: 5.7 10*3/uL (ref 4.0–10.5)
nRBC: 0 % (ref 0.0–0.2)

## 2022-09-27 LAB — MAGNESIUM: Magnesium: 1.9 mg/dL (ref 1.7–2.4)

## 2022-09-27 MED ORDER — SODIUM CHLORIDE 0.9 % IV SOLN: Freq: Once | INTRAVENOUS | Status: AC

## 2022-09-27 MED ORDER — ZOLEDRONIC ACID 4 MG/100ML IV SOLN
4.0000 mg | Freq: Once | INTRAVENOUS | Status: AC
  Administered 2022-09-27: 4 mg via INTRAVENOUS
  Filled 2022-09-27: qty 100

## 2022-09-27 NOTE — Patient Instructions (Signed)
[facility]  Discharge Instructions: Thank you for choosing [facility] to provide your oncology and hematology care.  If you have a lab appointment with the [facility], please come in thru the Main Entrance and check in at the main information desk.  Wear comfortable clothing and clothing appropriate for easy access to any Portacath or PICC line.   We strive to give you quality time with your provider. You may need to reschedule your appointment if you arrive late (15 or more minutes).  Arriving late affects you and other patients whose appointments are after yours.  Also, if you miss three or more appointments without notifying the office, you may be dismissed from the clinic at the provider's discretion.      For prescription refill requests, have your pharmacy contact our office and allow 72 hours for refills to be completed.    Today you received the following chemotherapy and/or immunotherapy agents zometa. Zoledronic Acid Injection (Cancer) What is this medication? ZOLEDRONIC ACID (ZOE le dron ik AS id) treats high calcium levels in the blood caused by cancer. It may also be used with chemotherapy to treat weakened bones caused by cancer. It works by slowing down the release of calcium from bones. This lowers calcium levels in your blood. It also makes your bones stronger and less likely to break (fracture). It belongs to a group of medications called bisphosphonates. This medicine may be used for other purposes; ask your health care provider or pharmacist if you have questions. COMMON BRAND NAME(S): Zometa, Zometa Powder What should I tell my care team before I take this medication? They need to know if you have any of these conditions: Dehydration Dental disease Kidney disease Liver disease Low levels of calcium in the blood Lung or breathing disease, such as asthma Receiving steroids, such as dexamethasone or prednisone An unusual or allergic  reaction to zoledronic acid, other medications, foods, dyes, or preservatives Pregnant or trying to get pregnant Breast-feeding How should I use this medication? This medication is injected into a vein. It is given by your care team in a hospital or clinic setting. Talk to your care team about the use of this medication in children. Special care may be needed. Overdosage: If you think you have taken too much of this medicine contact a poison control center or emergency room at once. NOTE: This medicine is only for you. Do not share this medicine with others. What if I miss a dose? Keep appointments for follow-up doses. It is important not to miss your dose. Call your care team if you are unable to keep an appointment. What may interact with this medication? Certain antibiotics given by injection Diuretics, such as bumetanide, furosemide NSAIDs, medications for pain and inflammation, such as ibuprofen or naproxen Teriparatide Thalidomide This list may not describe all possible interactions. Give your health care provider a list of all the medicines, herbs, non-prescription drugs, or dietary supplements you use. Also tell them if you smoke, drink alcohol, or use illegal drugs. Some items may interact with your medicine. What should I watch for while using this medication? Visit your care team for regular checks on your progress. It may be some time before you see the benefit from this medication. Some people who take this medication have severe bone, joint, or muscle pain. This medication may also increase your risk for jaw problems or a broken thigh bone. Tell your care team right away if you have severe pain  in your jaw, bones, joints, or muscles. Tell you care team if you have any pain that does not go away or that gets worse. Tell your dentist and dental surgeon that you are taking this medication. You should not have major dental surgery while on this medication. See your dentist to have a  dental exam and fix any dental problems before starting this medication. Take good care of your teeth while on this medication. Make sure you see your dentist for regular follow-up appointments. You should make sure you get enough calcium and vitamin D while you are taking this medication. Discuss the foods you eat and the vitamins you take with your care team. Check with your care team if you have severe diarrhea, nausea, and vomiting, or if you sweat a lot. The loss of too much body fluid may make it dangerous for you to take this medication. You may need bloodwork while taking this medication. Talk to your care team if you wish to become pregnant or think you might be pregnant. This medication can cause serious birth defects. What side effects may I notice from receiving this medication? Side effects that you should report to your care team as soon as possible: Allergic reactions--skin rash, itching, hives, swelling of the face, lips, tongue, or throat Kidney injury--decrease in the amount of urine, swelling of the ankles, hands, or feet Low calcium level--muscle pain or cramps, confusion, tingling, or numbness in the hands or feet Osteonecrosis of the jaw--pain, swelling, or redness in the mouth, numbness of the jaw, poor healing after dental work, unusual discharge from the mouth, visible bones in the mouth Severe bone, joint, or muscle pain Side effects that usually do not require medical attention (report to your care team if they continue or are bothersome): Constipation Fatigue Fever Loss of appetite Nausea Stomach pain This list may not describe all possible side effects. Call your doctor for medical advice about side effects. You may report side effects to FDA at 1-800-FDA-1088. Where should I keep my medication? This medication is given in a hospital or clinic. It will not be stored at home. NOTE: This sheet is a summary. It may not cover all possible information. If you have  questions about this medicine, talk to your doctor, pharmacist, or health care provider.  2023 Elsevier/Gold Standard (2007-08-22 00:00:00)       To help prevent nausea and vomiting after your treatment, we encourage you to take your nausea medication as directed.  BELOW ARE SYMPTOMS THAT SHOULD BE REPORTED IMMEDIATELY: *FEVER GREATER THAN 100.4 F (38 C) OR HIGHER *CHILLS OR SWEATING *NAUSEA AND VOMITING THAT IS NOT CONTROLLED WITH YOUR NAUSEA MEDICATION *UNUSUAL SHORTNESS OF BREATH *UNUSUAL BRUISING OR BLEEDING *URINARY PROBLEMS (pain or burning when urinating, or frequent urination) *BOWEL PROBLEMS (unusual diarrhea, constipation, pain near the anus) TENDERNESS IN MOUTH AND THROAT WITH OR WITHOUT PRESENCE OF ULCERS (sore throat, sores in mouth, or a toothache) UNUSUAL RASH, SWELLING OR PAIN  UNUSUAL VAGINAL DISCHARGE OR ITCHING   Items with * indicate a potential emergency and should be followed up as soon as possible or go to the Emergency Department if any problems should occur.  Please show the CHEMOTHERAPY ALERT CARD or IMMUNOTHERAPY ALERT CARD at check-in to the Emergency Department and triage nurse.  Should you have questions after your visit or need to cancel or reschedule your appointment, please contact [facility] [phone]  and follow the prompts.  Office hours are 8:00 a.m. to 4:30  p.m. Monday - Friday. Please note that voicemails left after 4:00 p.m. may not be returned until the following business day.  We are closed weekends and major holidays. You have access to a nurse at all times for urgent questions. Please call the main number to the clinic [phone] and follow the prompts.  For any non-urgent questions, you may also contact your provider using MyChart. We now offer e-Visits for anyone 34 and older to request care online for non-urgent symptoms. For details visit [email or web address].   Also download the MyChart app! Go to the app  store, search "MyChart", open the app, select [facility], and log in with your MyChart username and password.

## 2022-09-27 NOTE — Progress Notes (Signed)
Patient presents today for Zometa infusion. Patient taking Calcium Carb-Cholecalciferol 600-800 mg unit tabs daily. Calcium 8.7 and creatinine 0.80. Patient has no complaints today.   Zometa  given today per MD orders. Tolerated infusion without adverse affects. Vital signs stable. No complaints at this time. Discharged from clinic ambulatory in stable condition. Alert and oriented x 3. F/U with [facility] as scheduled.

## 2022-09-30 LAB — KAPPA/LAMBDA LIGHT CHAINS
Kappa free light chain: 16.7 mg/L (ref 3.3–19.4)
Kappa, lambda light chain ratio: 1.59 (ref 0.26–1.65)
Lambda free light chains: 10.5 mg/L (ref 5.7–26.3)

## 2022-10-02 LAB — PROTEIN ELECTROPHORESIS, SERUM
A/G Ratio: 1.7 (ref 0.7–1.7)
Albumin ELP: 3.7 g/dL (ref 2.9–4.4)
Alpha-1-Globulin: 0.3 g/dL (ref 0.0–0.4)
Alpha-2-Globulin: 0.6 g/dL (ref 0.4–1.0)
Beta Globulin: 0.8 g/dL (ref 0.7–1.3)
Gamma Globulin: 0.5 g/dL (ref 0.4–1.8)
Globulin, Total: 2.2 g/dL (ref 2.2–3.9)
Total Protein ELP: 5.9 g/dL — ABNORMAL LOW (ref 6.0–8.5)

## 2022-10-02 LAB — MISC LABCORP TEST (SEND OUT): Labcorp test code: 123218

## 2022-10-18 ENCOUNTER — Other Ambulatory Visit: Payer: Self-pay

## 2022-10-24 ENCOUNTER — Other Ambulatory Visit: Payer: Self-pay | Admitting: *Deleted

## 2022-10-24 ENCOUNTER — Other Ambulatory Visit: Payer: Self-pay

## 2022-10-24 MED ORDER — LENALIDOMIDE 10 MG PO CAPS: 10.0000 mg | ORAL_CAPSULE | Freq: Every day | ORAL | 0 refills | Status: DC

## 2022-10-24 NOTE — Telephone Encounter (Signed)
Chart reviewed. Revlimid refilled per last office note with Dr. Katragadda.  

## 2022-10-25 ENCOUNTER — Other Ambulatory Visit: Payer: Self-pay

## 2022-10-25 DIAGNOSIS — C9000 Multiple myeloma not having achieved remission: Secondary | ICD-10-CM

## 2022-10-27 NOTE — Progress Notes (Signed)
[facility] [address], [place] [address]    Clinic Day:  11/08/2022  Referring physician: Maximiano Coss,*  Patient Care Team: Maximiano Coss, MD as PCP - General (Family Medicine) Doreatha Massed, MD as Medical Oncologist (Medical Oncology)   ASSESSMENT & PLAN:   Assessment: 1. 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 Jacklynn Ganong - She reported discomfort in the left posterior back for the last 1 month.  She was seen by Dr. Leary Roca and x-rays showed abnormality.  A CT scan of the chest was done on 05/24/2021. - CT chest report reviewed by me from 05/24/2021 showed expansile oval-shaped lesion within the posterior aspect of the seventh rib with a nodule measuring 2.1 x 1.1 cm.  Adjacent cortex demonstrates area of thinning and destruction.  Stable emphysematous and interstitial changes within the lungs.  Stable pulmonary nodules within the right upper lobe and left upper lobe when compared to CT from September 2020. - CT of the abdomen and pelvis did not show any significant abnormalities. - She denies any fevers, night sweats or weight loss in the last 6 months.  She had history of basal cell carcinoma on the nose removed by Mohs surgery. - She also reported history of osteoporosis and broke left upper rib 1 year ago after sneezing. -Reviewed PET scan from 06/14/2021.  Mild hypermetabolism involving left aspect of the L5 vertebral body and also pedicle region surrounding the screw.  SUV 4.2.  There appears to be a lytic process on the CT scan.  The lytic left posterior seventh rib lesion is mildly hypermetabolic with SUV 2.98.  No other definite lytic hypermetabolic bone lesions seen. - We have reviewed left seventh rib bone biopsy from 06/25/2021 consistent with plasma cell neoplasm.  Cells are positive for CD138, CD56 and a kappa restricted.  - Labs on 06/13/2021 M spike 1.7 g.   Immunofixation IgA kappa.  Free light chain shows kappa light chain 71, ratio 8.22.  LDH and beta-2 microglobulin was normal. - Bone marrow biopsy on 07/04/2021 consistent with plasma cell neoplasm with 26% plasma cells on aspirate with atypical features and 50 to 60% on core biopsy.  Chromosome analysis was normal.  Multiple myeloma FISH panel shows loss of NF1/chromosome 17/17 q.  Gain of CCN D1 or chromosome 11/11 q.  Gain of chromosome 4/4P.  Loss of material from the long-arm of chromosome 13.  These are all standard risk features. - 24-hour urine total protein to 40 mg.  Urine immunofixation was negative.  Urine kappa light chains were 4.13. - 8 cycles of Dara RVD from 07/26/2021 through 03/01/2022 - T11 and T12 vertebroplasty by Dr. Jake Samples on 08/31/2021. - She has decided against bone marrow transplant after the advice of Dr. Valentina Lucks.   2. Social/family history: - She is married and lives at home with her husband.  She has not worked but had help her husband and his work.  She is a non-smoker. - Maternal grandmother had colon cancer.  Mother had a squamous cell carcinoma of the skin.   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 - She reports nausea on and off for 1 week.  She takes Zofran daily.  She has constipation alternating with loose stools.  She has more joint pains from coughing. - Myeloma panel from 09/27/2022 with M spike not observed.  Light chain ratio  is normal. - Reviewed labs from today which shows normal CBC.  LFTs and creatinine are normal. - Continue Revlimid 10 mg 3 weeks on/1 week off.  RTC 8 weeks for follow-up with repeat myeloma labs.  2.  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  No antibiotics in the last 1 month. - Recent  cultures showed MAI, blastobotrys raffinase, scedosporium, and paecilomyces.  She has  an appointment to see Dr. Valentina Lucks next Monday.  3.  ID prophylaxis: - Valtrex was discontinued after stopping Darzalex.  4.  Myeloma bone disease: - Calcium is 8.7.  Proceed with Zometa today and in 4 weeks.  No orders of the defined types were placed in this encounter.     I,Katie Daubenspeck,acting as a Neurosurgeon for Doreatha Massed, MD.,have documented all relevant documentation on the behalf of Doreatha Massed, MD,as directed by  Doreatha Massed, MD while in the presence of Doreatha Massed, MD.   I, Doreatha Massed MD, have reviewed the above documentation for accuracy and completeness, and I agree with the above.   Doreatha Massed, MD   4/26/20246:18 PM  CHIEF COMPLAINT:   Diagnosis: multiple myeloma    Cancer Staging  Multiple myeloma (HCC) Staging form: Multiple Myeloma, AJCC 6th Edition - Clinical stage from 07/17/2021: Stage IA - Unsigned    Prior Therapy: DaraVRd (Daratumumab SQ) q21d x 6 Cycles (Induction/Consolidation)   Current Therapy:  Revlimid maintenance    HISTORY OF PRESENT ILLNESS:   Oncology History  Multiple myeloma (HCC)  07/17/2021 Initial Diagnosis   Multiple myeloma (HCC)   07/26/2021 - 03/15/2022 Chemotherapy   Patient is on Treatment Plan : MYELOMA NEWLY DIAGNOSED TRANSPLANT CANDIDATE DaraVRd (Daratumumab SQ) q21d x 6 Cycles (Induction/Consolidation)     04/17/2022 -  Chemotherapy   Patient is on Treatment Plan : MYELOMA NEWLY DIAGNOSED TRANSPLANT CANDIDATE Daratumumab SQ + Lenalidomide q28d (Maintenance)        INTERVAL HISTORY:   Amanda Conner is a 72 y.o. female presenting to clinic today for follow up of multiple myeloma. She was last seen by me on 08/29/22.  Today, she states that she is doing well overall. Her appetite level is at 50%. Her energy level is at 10%.  PAST MEDICAL HISTORY:   Past Medical History: Past Medical History:  Diagnosis Date   Arthritis    osteoarthritis   Back pain    Bronchiectasis (HCC)     Cancer (HCC)    basal cell nose   Chronic cough    Complication of anesthesia    Dyspnea    Gastrointestinal symptoms    GERD (gastroesophageal reflux disease)    Glaucoma    History of hiatal hernia    Hyperlipemia    Hypertension    Multiple myeloma (HCC)    Osteopenia    Pneumonia    PONV (postoperative nausea and vomiting)    "only one time"   Spondylisthesis     Surgical History: Past Surgical History:  Procedure Laterality Date   ABDOMINAL EXPOSURE N/A 12/29/2018   Procedure: ABDOMINAL EXPOSURE;  Surgeon: Chuck Hint, MD;  Location: [facility] OR;  Service: Vascular;  Laterality: N/A;   ABDOMINOPLASTY  2002   ABDOMINOPLASTY  1970's   ANKLE SURGERY Left 10/2015   ANTERIOR LATERAL LUMBAR FUSION WITH PERCUTANEOUS SCREW 3 LEVEL Left 12/29/2018   Procedure: Lumbar Two to Lumbar Five Anterolateral lumbar interbody fusion;  Surgeon: Maeola Harman, MD;  Location: [facility] OR;  Service: Neurosurgery;  Laterality: Left;  anterolateral   ANTERIOR LUMBAR FUSION N/A 12/29/2018   Procedure: Lumbar Five Sacral One Anterior lumbar interbody fusion;  Surgeon: Maeola Harman, MD;  Location: [facility] OR;  Service: Neurosurgery;  Laterality: N/A;  anterior approach   BASAL CELL CARCINOMA EXCISION  06/2018   nose   BLEPHAROPLASTY Bilateral 1999   BREAST ENHANCEMENT SURGERY  1970's   COLONOSCOPY     CYSTOURETHROSCOPY  2018   Doble J ureteal stents   DECOMPRESSION CORE HIP Left 2014   FACIAL COSMETIC SURGERY  2004   FLUOROSCOPY GUIDANCE     HIP ARTHROPLASTY Left    INCISIONAL HERNIA REPAIR N/A 10/08/2019   Procedure: OPEN INCISIONAL HERNIA REPAIR WITH MESH;  Surgeon: Darnell Level, MD;  Location: [facility] ORS;  Service: General;  Laterality: N/A;   JOINT REPLACEMENT Left    Hip; 05/2014   KYPHOPLASTY N/A 08/31/2021   Procedure: KYPHOPLASTY THORACIC ELEVEN, THORACIC TWELVE;  Surgeon: Bethann Goo, DO;  Location: [facility] OR;  Service: Neurosurgery;  Laterality: N/A;   LAPAROSCOPIC PARTIAL COLECTOMY   06/26/2017   LUMBAR PERCUTANEOUS PEDICLE SCREW 4 LEVEL N/A 12/29/2018   Procedure: Lumbar Percutaneous Pedicle Screw Placement Lumbar two-Sacral one;  Surgeon: Maeola Harman, MD;  Location: [facility] OR;  Service: Neurosurgery;  Laterality: N/A;   MOHS SURGERY  2019   nose   PARTIAL COLECTOMY  06/2017   for diverticulitis   REMOVAL OF BILATERAL TISSUE EXPANDERS WITH PLACEMENT OF BILATERAL BREAST IMPLANTS  2004   THIGH LIFT  1994   TOE FUSION Right    great toe   TONSILLECTOMY     UMBILICAL HERNIA REPAIR     with tummy tuck   UMBILICAL HERNIA REPAIR  2002    Social History: Social History   Socioeconomic History   Marital status: Married    Spouse name: Not on file   Number of children: Not on file   Years of education: Not on file   Highest education level: Not on file  Occupational History   Not on file  Tobacco Use   Smoking status: Never   Smokeless tobacco: Never  Vaping Use   Vaping Use: Never used  Substance and Sexual Activity   Alcohol use: Yes    Comment: occasional   Drug use: Never   Sexual activity: Not on file  Other Topics Concern   Not on file  Social History Narrative   Not on file   Social Determinants of Health   Financial Resource Strain: Low Risk  (07/24/2021)   Overall Financial Resource Strain (CARDIA)    Difficulty of Paying Living Expenses: Not hard at all  Food Insecurity: No Food Insecurity (07/24/2021)   Hunger Vital Sign    Worried About Running Out of Food in the Last Year: Never true    Ran Out of Food in the Last Year: Never true  Transportation Needs: No Transportation Needs (07/24/2021)   PRAPARE - Administrator, Civil Service (Medical): No    Lack of Transportation (Non-Medical): No  Physical Activity: Not on file  Stress: Not on file  Social Connections: Socially Integrated (07/24/2021)   Social Connection and Isolation Panel [NHANES]    Frequency of Communication with Friends and Family: More than three times a week     Frequency of Social Gatherings with Friends and Family: More than three times a week    Attends Religious Services: 1 to 4 times per year    Active Member of Clubs or  Organizations: No    Attends Engineer, structural: 1 to 4 times per year    Marital Status: Married  Catering manager Violence: Not on file    Family History: Family History  Problem Relation Age of Onset   Hypertension Mother    COPD Mother    Hypertension Father    Heart disease Father     Current Medications:  Current Outpatient Medications:    albuterol (VENTOLIN HFA) 108 (90 Base) MCG/ACT inhaler, Inhale 2 puffs into the lungs every 6 (six) hours as needed for wheezing or shortness of breath., Disp: , Rfl:    ANORO ELLIPTA 62.5-25 MCG/ACT AEPB, Inhale 1 puff into the lungs daily., Disp: , Rfl:    aspirin EC 81 MG tablet, Take 81 mg by mouth daily. Swallow whole., Disp: , Rfl:    Baclofen 5 MG TABS, Take 1 tablet by mouth 3 (three) times daily as needed., Disp: , Rfl:    benzonatate (TESSALON) 100 MG capsule, Take 100 mg by mouth 3 (three) times daily as needed for cough., Disp: , Rfl:    Biotin 1000 MCG tablet, Take 1,000 mcg by mouth daily., Disp: , Rfl:    calcitonin, salmon, (MIACALCIN/FORTICAL) 200 UNIT/ACT nasal spray, Place 1 spray into alternate nostrils daily., Disp: , Rfl:    Calcium Carb-Cholecalciferol 600-800 MG-UNIT TABS, Take 1 tablet by mouth daily., Disp: , Rfl:    ciprofloxacin (CIPRO) 500 MG tablet, Take 500 mg by mouth 2 (two) times daily., Disp: , Rfl:    daratumumab-hyaluronidase-fihj (DARZALEX FASPRO) 1800-30000 MG-UT/15ML SOLN, Inject 1,800 mg into the skin once a week., Disp: , Rfl:    dexamethasone (DECADRON) 2 MG tablet, Take 5 tablets (10 mg total) by mouth every 30 (thirty) days., Disp: 20 tablet, Rfl: 4   dextromethorphan-guaiFENesin (MUCINEX DM) 30-600 MG 12hr tablet, Take 1 tablet by mouth daily. , Disp: , Rfl:    estrogen, conjugated,-medroxyprogesterone (PREMPRO) 0.45-1.5 MG  tablet, Take 1 tablet by mouth daily., Disp: , Rfl:    feeding supplement (ENSURE ENLIVE / ENSURE PLUS) LIQD, Take 237 mLs by mouth 2 (two) times daily between meals. (Patient taking differently: Take 237 mLs by mouth daily.), Disp: 237 mL, Rfl: 12   fluticasone (FLONASE) 50 MCG/ACT nasal spray, Place 1 spray into both nostrils daily., Disp: , Rfl:    furosemide (LASIX) 20 MG tablet, TAKE 1 TABLET BY MOUTH EVERY DAY AS NEEDED, Disp: 90 tablet, Rfl: 3   hydrochlorothiazide (HYDRODIURIL) 25 MG tablet, Take 25 mg by mouth daily as needed (swelling)., Disp: , Rfl:    HYDROcodone bit-homatropine (HYCODAN) 5-1.5 MG/5ML syrup, Take 5 mLs by mouth every 6 (six) hours as needed for cough., Disp: 473 mL, Rfl: 0   Lactulose 20 GM/30ML SOLN, Take 30 ml by mouth every 3 hours until bowel movement is had, then take 30 ml daily (Patient taking differently: Take 20 g by mouth daily as needed (constipation).), Disp: 946 mL, Rfl: 3   latanoprost (XALATAN) 0.005 % ophthalmic solution, Place 1 drop into both eyes at bedtime. , Disp: , Rfl:    lenalidomide (REVLIMID) 10 MG capsule, Take 1 capsule (10 mg total) by mouth daily. 21 days on, 7 days off, Disp: 21 capsule, Rfl: 0   linaclotide (LINZESS) 145 MCG CAPS capsule, Take 145 mcg by mouth daily as needed (constipation)., Disp: , Rfl:    lisinopril (ZESTRIL) 20 MG tablet, Take 20 mg by mouth daily., Disp: , Rfl:    loratadine (CLARITIN) 10 MG tablet, Take  10 mg by mouth daily., Disp: , Rfl:    loteprednol (LOTEMAX) 0.5 % ophthalmic suspension, Place into the left eye., Disp: , Rfl:    methocarbamol (ROBAXIN) 500 MG tablet, Take 1 tablet (500 mg total) by mouth every 6 (six) hours as needed for muscle spasms., Disp: 40 tablet, Rfl: 3   omeprazole (PRILOSEC) 20 MG capsule, Take 20 mg by mouth daily., Disp: , Rfl:    ondansetron (ZOFRAN ODT) 4 MG disintegrating tablet, Take 1 tablet (4 mg total) by mouth every 4 (four) hours as needed for nausea or vomiting., Disp: 60  tablet, Rfl: 3   Oxycodone HCl 10 MG TABS, Take 1 tablet (10 mg total) by mouth every 6 (six) hours as needed., Disp: 120 tablet, Rfl: 0   pantoprazole (PROTONIX) 40 MG tablet, Take 40 mg by mouth daily., Disp: , Rfl:    predniSONE (DELTASONE) 10 MG tablet, TAKE 6 TABLETS ON DAY 1 AS DIRECTED ON PACKAGE AND DECREASE BY 1 TAB EACH DAY FOR A TOTAL OF 6 DAYS, Disp: , Rfl:    sucralfate (CARAFATE) 1 g tablet, Take 1 g by mouth 4 (four) times daily., Disp: , Rfl:    SUMAtriptan (IMITREX) 100 MG tablet, Take 100 mg by mouth every 2 (two) hours as needed for migraine. May repeat in 2 hours if headache persists or recurs., Disp: , Rfl:    tiZANidine (ZANAFLEX) 2 MG tablet, Take 2 mg by mouth every 6 (six) hours as needed., Disp: , Rfl:    UNABLE TO FIND, Place 0.5 g into both eyes 3 (three) times daily. Med Name: Loteman (otepredol), Disp: , Rfl:    Allergies: Allergies  Allergen Reactions   Tobramycin-Dexamethasone Other (See Comments)    Itching and swelling   Ethambutol Other (See Comments)    Fever   Neomycin-Polymyxin-Dexameth Itching and Swelling   Amikacin Other (See Comments)    Eosinophilia with chills and aching when given with cefoxitin   Biaxin [Clarithromycin] Itching   Cefoxitin Other (See Comments)    Eosinophlia when given with amikacin   Sulfamethoxazole-Trimethoprim Itching   Vancomycin     "Got red splotches on skin after several doses"   Bacitracin-Polymyxin B Rash    REVIEW OF SYSTEMS:   Review of Systems  Constitutional:  Negative for chills, fatigue and fever.  HENT:   Negative for lump/mass, mouth sores, nosebleeds, sore throat and trouble swallowing.   Eyes:  Negative for eye problems.  Respiratory:  Positive for cough and shortness of breath.   Cardiovascular:  Negative for chest pain, leg swelling and palpitations.  Gastrointestinal:  Positive for constipation and diarrhea. Negative for abdominal pain, nausea and vomiting.  Genitourinary:  Negative for bladder  incontinence, difficulty urinating, dysuria, frequency, hematuria and nocturia.   Musculoskeletal:  Negative for arthralgias, back pain, flank pain, myalgias and neck pain.  Skin:  Negative for itching and rash.  Neurological:  Negative for dizziness, headaches and numbness.  Hematological:  Does not bruise/bleed easily.  Psychiatric/Behavioral:  Positive for sleep disturbance. Negative for depression and suicidal ideas. The patient is not nervous/anxious.   All other systems reviewed and are negative.    VITALS:   Blood pressure 136/80, pulse 64, temperature 98.2 F (36.8 C), temperature source Oral, resp. rate 18, weight 112 lb 11.2 oz (51.1 kg), SpO2 99 %.  Wt Readings from Last 3 Encounters:  10/28/22 112 lb 11.2 oz (51.1 kg)  09/27/22 111 lb 14.4 oz (50.8 kg)  08/29/22 112 lb 6.4 oz (51  kg)    Body mass index is 21.29 kg/m.  Performance status (ECOG): 1 - Symptomatic but completely ambulatory  PHYSICAL EXAM:   Physical Exam Vitals and nursing note reviewed. Exam conducted with a chaperone present.  Constitutional:      Appearance: Normal appearance.  Cardiovascular:     Rate and Rhythm: Normal rate and regular rhythm.     Pulses: Normal pulses.     Heart sounds: Normal heart sounds.  Pulmonary:     Effort: Pulmonary effort is normal.     Breath sounds: Normal breath sounds.  Abdominal:     Palpations: Abdomen is soft. There is no hepatomegaly, splenomegaly or mass.     Tenderness: There is no abdominal tenderness.  Musculoskeletal:     Right lower leg: No edema.     Left lower leg: No edema.  Lymphadenopathy:     Cervical: No cervical adenopathy.     Right cervical: No superficial, deep or posterior cervical adenopathy.    Left cervical: No superficial, deep or posterior cervical adenopathy.     Upper Body:     Right upper body: No supraclavicular or axillary adenopathy.     Left upper body: No supraclavicular or axillary adenopathy.  Neurological:     General:  No focal deficit present.     Mental Status: She is alert and oriented to person, place, and time.  Psychiatric:        Mood and Affect: Mood normal.        Behavior: Behavior normal.     LABS:      Latest Ref Rng & Units 10/28/2022   10:19 AM 09/27/2022    8:28 AM 08/29/2022    9:26 AM  CBC  WBC 4.0 - 10.5 K/uL 5.9  5.7  8.0   Hemoglobin 12.0 - 15.0 g/dL 16.1  09.6  04.5   Hematocrit 36.0 - 46.0 % 38.9  36.5  34.4   Platelets 150 - 400 K/uL 272  273  328       Latest Ref Rng & Units 10/28/2022   10:19 AM 09/27/2022    8:28 AM 08/29/2022    9:26 AM  CMP  Glucose 70 - 99 mg/dL 99  91  409   BUN 8 - 23 mg/dL 10  12  12    Creatinine 0.44 - 1.00 mg/dL 8.11  9.14  7.82   Sodium 135 - 145 mmol/L 133  137  130   Potassium 3.5 - 5.1 mmol/L 3.5  3.9  3.4   Chloride 98 - 111 mmol/L 101  100  97   CO2 22 - 32 mmol/L 25  27  26    Calcium 8.9 - 10.3 mg/dL 8.7  8.7  8.2   Total Protein 6.5 - 8.1 g/dL 7.3  6.5  6.4   Total Bilirubin 0.3 - 1.2 mg/dL 0.5  0.5  0.4   Alkaline Phos 38 - 126 U/L 68  65  70   AST 15 - 41 U/L 23  20  18    ALT 0 - 44 U/L 22  20  20       No results found for: "CEA1", "CEA" / No results found for: "CEA1", "CEA" No results found for: "PSA1" No results found for: "NFA213" No results found for: "CAN125"  Lab Results  Component Value Date   TOTALPROTELP 6.8 10/28/2022   ALBUMINELP 4.0 10/28/2022   A1GS 0.4 10/28/2022   A2GS 0.8 10/28/2022   BETS 1.0 10/28/2022   GAMS 0.6  10/28/2022   MSPIKE Not Observed 10/28/2022   SPEI Comment 10/28/2022   No results found for: "TIBC", "FERRITIN", "IRONPCTSAT" Lab Results  Component Value Date   LDH 162 04/17/2022   LDH 140 03/15/2022   LDH 143 02/14/2022     STUDIES:   No results found.

## 2022-10-28 ENCOUNTER — Other Ambulatory Visit: Payer: Self-pay

## 2022-10-28 ENCOUNTER — Inpatient Hospital Stay: Payer: Medicare Other

## 2022-10-28 ENCOUNTER — Encounter (HOSPITAL_COMMUNITY): Payer: Self-pay | Admitting: Hematology

## 2022-10-28 ENCOUNTER — Encounter: Payer: Self-pay | Admitting: Hematology

## 2022-10-28 ENCOUNTER — Inpatient Hospital Stay: Payer: Medicare Other | Attending: Hematology | Admitting: Hematology

## 2022-10-28 VITALS — BP 140/64 | HR 63 | Temp 98.2°F | Resp 16

## 2022-10-28 VITALS — BP 136/80 | HR 64 | Temp 98.2°F | Resp 18 | Wt 112.7 lb

## 2022-10-28 DIAGNOSIS — Z9221 Personal history of antineoplastic chemotherapy: Secondary | ICD-10-CM | POA: Insufficient documentation

## 2022-10-28 DIAGNOSIS — C9000 Multiple myeloma not having achieved remission: Secondary | ICD-10-CM | POA: Diagnosis not present

## 2022-10-28 DIAGNOSIS — R197 Diarrhea, unspecified: Secondary | ICD-10-CM | POA: Diagnosis not present

## 2022-10-28 DIAGNOSIS — R110 Nausea: Secondary | ICD-10-CM | POA: Insufficient documentation

## 2022-10-28 DIAGNOSIS — Z800 Family history of malignant neoplasm of digestive organs: Secondary | ICD-10-CM | POA: Insufficient documentation

## 2022-10-28 DIAGNOSIS — Z808 Family history of malignant neoplasm of other organs or systems: Secondary | ICD-10-CM | POA: Diagnosis not present

## 2022-10-28 DIAGNOSIS — K5900 Constipation, unspecified: Secondary | ICD-10-CM | POA: Diagnosis not present

## 2022-10-28 DIAGNOSIS — R059 Cough, unspecified: Secondary | ICD-10-CM | POA: Insufficient documentation

## 2022-10-28 DIAGNOSIS — Z85828 Personal history of other malignant neoplasm of skin: Secondary | ICD-10-CM | POA: Diagnosis not present

## 2022-10-28 DIAGNOSIS — Z79899 Other long term (current) drug therapy: Secondary | ICD-10-CM | POA: Insufficient documentation

## 2022-10-28 LAB — COMPREHENSIVE METABOLIC PANEL
ALT: 22 U/L (ref 0–44)
AST: 23 U/L (ref 15–41)
Albumin: 4.3 g/dL (ref 3.5–5.0)
Alkaline Phosphatase: 68 U/L (ref 38–126)
Anion gap: 7 (ref 5–15)
BUN: 10 mg/dL (ref 8–23)
CO2: 25 mmol/L (ref 22–32)
Calcium: 8.7 mg/dL — ABNORMAL LOW (ref 8.9–10.3)
Chloride: 101 mmol/L (ref 98–111)
Creatinine, Ser: 0.75 mg/dL (ref 0.44–1.00)
GFR, Estimated: >60 mL/min (ref >60)
Glucose, Bld: 99 mg/dL (ref 70–99)
Potassium: 3.5 mmol/L (ref 3.5–5.1)
Sodium: 133 mmol/L — ABNORMAL LOW (ref 135–145)
Total Bilirubin: 0.5 mg/dL (ref 0.3–1.2)
Total Protein: 7.3 g/dL (ref 6.5–8.1)

## 2022-10-28 LAB — CBC WITH DIFFERENTIAL/PLATELET
Abs Immature Granulocytes: 0.02 10*3/uL (ref 0.00–0.07)
Basophils Absolute: 0.1 10*3/uL (ref 0.0–0.1)
Basophils Relative: 1 %
Eosinophils Absolute: 0.5 10*3/uL (ref 0.0–0.5)
Eosinophils Relative: 8 %
HCT: 38.9 % (ref 36.0–46.0)
Hemoglobin: 12.3 g/dL (ref 12.0–15.0)
Immature Granulocytes: 0 %
Lymphocytes Relative: 30 %
Lymphs Abs: 1.8 10*3/uL (ref 0.7–4.0)
MCH: 28.2 pg (ref 26.0–34.0)
MCHC: 31.6 g/dL (ref 30.0–36.0)
MCV: 89.2 fL (ref 80.0–100.0)
Monocytes Absolute: 0.6 10*3/uL (ref 0.1–1.0)
Monocytes Relative: 10 %
Neutro Abs: 2.9 10*3/uL (ref 1.7–7.7)
Neutrophils Relative %: 51 %
Platelets: 272 10*3/uL (ref 150–400)
RBC: 4.36 MIL/uL (ref 3.87–5.11)
RDW: 18.1 % — ABNORMAL HIGH (ref 11.5–15.5)
WBC: 5.9 10*3/uL (ref 4.0–10.5)
nRBC: 0 % (ref 0.0–0.2)

## 2022-10-28 LAB — MAGNESIUM: Magnesium: 1.9 mg/dL (ref 1.7–2.4)

## 2022-10-28 MED ORDER — ZOLEDRONIC ACID 4 MG/100ML IV SOLN
4.0000 mg | Freq: Once | INTRAVENOUS | Status: AC
  Administered 2022-10-28: 4 mg via INTRAVENOUS
  Filled 2022-10-28: qty 100

## 2022-10-28 MED ORDER — SODIUM CHLORIDE 0.9 % IV SOLN: Freq: Once | INTRAVENOUS | Status: AC

## 2022-10-28 NOTE — Patient Instructions (Signed)
[facility]  Discharge Instructions: Thank you for choosing [facility] to provide your oncology and hematology care.  If you have a lab appointment with the Cancer Center, please come in thru the Main Entrance and check in at the main information desk.  Wear comfortable clothing and clothing appropriate for easy access to any Portacath or PICC line.   We strive to give you quality time with your provider. You may need to reschedule your appointment if you arrive late (15 or more minutes).  Arriving late affects you and other patients whose appointments are after yours.  Also, if you miss three or more appointments without notifying the office, you may be dismissed from the clinic at the provider's discretion.      For prescription refill requests, have your pharmacy contact our office and allow 72 hours for refills to be completed.    Today you received the following chemotherapy and/or immunotherapy agents zometa. Zoledronic Acid Injection (Cancer) What is this medication? ZOLEDRONIC ACID (ZOE le dron ik AS id) treats high calcium levels in the blood caused by cancer. It may also be used with chemotherapy to treat weakened bones caused by cancer. It works by slowing down the release of calcium from bones. This lowers calcium levels in your blood. It also makes your bones stronger and less likely to break (fracture). It belongs to a group of medications called bisphosphonates. This medicine may be used for other purposes; ask your health care provider or pharmacist if you have questions. COMMON BRAND NAME(S): Zometa, Zometa Powder What should I tell my care team before I take this medication? They need to know if you have any of these conditions: Dehydration Dental disease Kidney disease Liver disease Low levels of calcium in the blood Lung or breathing disease, such as asthma Receiving steroids, such as dexamethasone or prednisone An unusual or allergic  reaction to zoledronic acid, other medications, foods, dyes, or preservatives Pregnant or trying to get pregnant Breast-feeding How should I use this medication? This medication is injected into a vein. It is given by your care team in a hospital or clinic setting. Talk to your care team about the use of this medication in children. Special care may be needed. Overdosage: If you think you have taken too much of this medicine contact a poison control center or emergency room at once. NOTE: This medicine is only for you. Do not share this medicine with others. What if I miss a dose? Keep appointments for follow-up doses. It is important not to miss your dose. Call your care team if you are unable to keep an appointment. What may interact with this medication? Certain antibiotics given by injection Diuretics, such as bumetanide, furosemide NSAIDs, medications for pain and inflammation, such as ibuprofen or naproxen Teriparatide Thalidomide This list may not describe all possible interactions. Give your health care provider a list of all the medicines, herbs, non-prescription drugs, or dietary supplements you use. Also tell them if you smoke, drink alcohol, or use illegal drugs. Some items may interact with your medicine. What should I watch for while using this medication? Visit your care team for regular checks on your progress. It may be some time before you see the benefit from this medication. Some people who take this medication have severe bone, joint, or muscle pain. This medication may also increase your risk for jaw problems or a broken thigh bone. Tell your care team right away if you have severe pain   in your jaw, bones, joints, or muscles. Tell you care team if you have any pain that does not go away or that gets worse. Tell your dentist and dental surgeon that you are taking this medication. You should not have major dental surgery while on this medication. See your dentist to have a  dental exam and fix any dental problems before starting this medication. Take good care of your teeth while on this medication. Make sure you see your dentist for regular follow-up appointments. You should make sure you get enough calcium and vitamin D while you are taking this medication. Discuss the foods you eat and the vitamins you take with your care team. Check with your care team if you have severe diarrhea, nausea, and vomiting, or if you sweat a lot. The loss of too much body fluid may make it dangerous for you to take this medication. You may need bloodwork while taking this medication. Talk to your care team if you wish to become pregnant or think you might be pregnant. This medication can cause serious birth defects. What side effects may I notice from receiving this medication? Side effects that you should report to your care team as soon as possible: Allergic reactions--skin rash, itching, hives, swelling of the face, lips, tongue, or throat Kidney injury--decrease in the amount of urine, swelling of the ankles, hands, or feet Low calcium level--muscle pain or cramps, confusion, tingling, or numbness in the hands or feet Osteonecrosis of the jaw--pain, swelling, or redness in the mouth, numbness of the jaw, poor healing after dental work, unusual discharge from the mouth, visible bones in the mouth Severe bone, joint, or muscle pain Side effects that usually do not require medical attention (report to your care team if they continue or are bothersome): Constipation Fatigue Fever Loss of appetite Nausea Stomach pain This list may not describe all possible side effects. Call your doctor for medical advice about side effects. You may report side effects to FDA at 1-800-FDA-1088. Where should I keep my medication? This medication is given in a hospital or clinic. It will not be stored at home. NOTE: This sheet is a summary. It may not cover all possible information. If you have  questions about this medicine, talk to your doctor, pharmacist, or health care provider.  2023 Elsevier/Gold Standard (2007-08-22 00:00:00)       To help prevent nausea and vomiting after your treatment, we encourage you to take your nausea medication as directed.  BELOW ARE SYMPTOMS THAT SHOULD BE REPORTED IMMEDIATELY: *FEVER GREATER THAN 100.4 F (38 C) OR HIGHER *CHILLS OR SWEATING *NAUSEA AND VOMITING THAT IS NOT CONTROLLED WITH YOUR NAUSEA MEDICATION *UNUSUAL SHORTNESS OF BREATH *UNUSUAL BRUISING OR BLEEDING *URINARY PROBLEMS (pain or burning when urinating, or frequent urination) *BOWEL PROBLEMS (unusual diarrhea, constipation, pain near the anus) TENDERNESS IN MOUTH AND THROAT WITH OR WITHOUT PRESENCE OF ULCERS (sore throat, sores in mouth, or a toothache) UNUSUAL RASH, SWELLING OR PAIN  UNUSUAL VAGINAL DISCHARGE OR ITCHING   Items with * indicate a potential emergency and should be followed up as soon as possible or go to the Emergency Department if any problems should occur.  Please show the CHEMOTHERAPY ALERT CARD or IMMUNOTHERAPY ALERT CARD at check-in to the Emergency Department and triage nurse.  Should you have questions after your visit or need to cancel or reschedule your appointment, please contact [facility] [phone]  and follow the prompts.  Office hours are 8:00 a.m. to 4:30   p.m. Monday - Friday. Please note that voicemails left after 4:00 p.m. may not be returned until the following business day.  We are closed weekends and major holidays. You have access to a nurse at all times for urgent questions. Please call the main number to the clinic [phone] and follow the prompts.  For any non-urgent questions, you may also contact your provider using MyChart. We now offer e-Visits for anyone 18 and older to request care online for non-urgent symptoms. For details visit [email or web address].   Also download the MyChart app! Go to the app  store, search "MyChart", open the app, select [facility], and log in with your MyChart username and password.   

## 2022-10-28 NOTE — Patient Instructions (Signed)
[facility] Discharge Instructions   You were seen and examined today by Dr. Katragadda.  He reviewed the results of your lab work which are normal/stable.   We will proceed with your Zometa infusion today.   Continue Revlimid as prescribed.   Return as scheduled.    Thank you for choosing [facility] to provide your oncology and hematology care.  To afford each patient quality time with our provider, please arrive at least 15 minutes before your scheduled appointment time.   If you have a lab appointment with the Cancer Center please come in thru the Main Entrance and check in at the main information desk.  You need to re-schedule your appointment should you arrive 10 or more minutes late.  We strive to give you quality time with our providers, and arriving late affects you and other patients whose appointments are after yours.  Also, if you no show three or more times for appointments you may be dismissed from the clinic at the providers discretion.     Again, thank you for choosing [facility].  Our hope is that these requests will decrease the amount of time that you wait before being seen by our physicians.       _____________________________________________________________  Should you have questions after your visit to [facility], please contact our office at [phone] and follow the prompts.  Our office hours are 8:00 a.m. and 4:30 p.m. Monday - Friday.  Please note that voicemails left after 4:00 p.m. may not be returned until the following business day.  We are closed weekends and major holidays.  You do have access to a nurse 24-7, just call the main number to the clinic [phone] and do not press any options, hold on the line and a nurse will answer the phone.    For prescription refill requests, have your pharmacy contact our office and allow 72 hours.    Due to Covid,  you will need to wear a mask upon entering the hospital. If you do not have a mask, a mask will be given to you at the Main Entrance upon arrival. For doctor visits, patients may have 1 support person age 18 or older with them. For treatment visits, patients can not have anyone with them due to social distancing guidelines and our immunocompromised population.       

## 2022-10-28 NOTE — Progress Notes (Signed)
Patient is taking Revlimid as prescribed.  She has not missed any doses and reports no side effects at this time.  ? ?

## 2022-10-29 ENCOUNTER — Other Ambulatory Visit: Payer: Self-pay

## 2022-10-30 LAB — KAPPA/LAMBDA LIGHT CHAINS
Kappa free light chain: 15.4 mg/L (ref 3.3–19.4)
Kappa, lambda light chain ratio: 1.67 — ABNORMAL HIGH (ref 0.26–1.65)
Lambda free light chains: 9.2 mg/L (ref 5.7–26.3)

## 2022-10-31 LAB — MISC LABCORP TEST (SEND OUT): Labcorp test code: 123218

## 2022-11-01 LAB — PROTEIN ELECTROPHORESIS, SERUM
A/G Ratio: 1.4 (ref 0.7–1.7)
Albumin ELP: 4 g/dL (ref 2.9–4.4)
Alpha-1-Globulin: 0.4 g/dL (ref 0.0–0.4)
Alpha-2-Globulin: 0.8 g/dL (ref 0.4–1.0)
Beta Globulin: 1 g/dL (ref 0.7–1.3)
Gamma Globulin: 0.6 g/dL (ref 0.4–1.8)
Globulin, Total: 2.8 g/dL (ref 2.2–3.9)
Total Protein ELP: 6.8 g/dL (ref 6.0–8.5)

## 2022-11-08 ENCOUNTER — Encounter (HOSPITAL_COMMUNITY): Payer: Self-pay | Admitting: Hematology

## 2022-11-08 ENCOUNTER — Encounter: Payer: Self-pay | Admitting: Hematology

## 2022-11-18 ENCOUNTER — Other Ambulatory Visit: Payer: Self-pay

## 2022-11-18 MED ORDER — LENALIDOMIDE 10 MG PO CAPS: 10.0000 mg | ORAL_CAPSULE | Freq: Every day | ORAL | 0 refills | Status: DC

## 2022-11-18 NOTE — Telephone Encounter (Signed)
Chart reviewed. Revlimid refilled per last office note with Dr. Katragadda.  

## 2022-11-25 ENCOUNTER — Inpatient Hospital Stay: Payer: Medicare Other | Attending: Hematology

## 2022-11-25 ENCOUNTER — Inpatient Hospital Stay: Payer: Medicare Other

## 2022-11-25 VITALS — BP 118/72 | HR 70 | Temp 97.4°F | Resp 16

## 2022-11-25 DIAGNOSIS — C9000 Multiple myeloma not having achieved remission: Secondary | ICD-10-CM | POA: Diagnosis present

## 2022-11-25 LAB — CBC WITH DIFFERENTIAL/PLATELET
Abs Immature Granulocytes: 0.02 10*3/uL (ref 0.00–0.07)
Basophils Absolute: 0.1 10*3/uL (ref 0.0–0.1)
Basophils Relative: 1 %
Eosinophils Absolute: 0.6 10*3/uL — ABNORMAL HIGH (ref 0.0–0.5)
Eosinophils Relative: 9 %
HCT: 37.7 % (ref 36.0–46.0)
Hemoglobin: 11.6 g/dL — ABNORMAL LOW (ref 12.0–15.0)
Immature Granulocytes: 0 %
Lymphocytes Relative: 35 %
Lymphs Abs: 2.2 10*3/uL (ref 0.7–4.0)
MCH: 27.8 pg (ref 26.0–34.0)
MCHC: 30.8 g/dL (ref 30.0–36.0)
MCV: 90.2 fL (ref 80.0–100.0)
Monocytes Absolute: 0.6 10*3/uL (ref 0.1–1.0)
Monocytes Relative: 10 %
Neutro Abs: 2.8 10*3/uL (ref 1.7–7.7)
Neutrophils Relative %: 45 %
Platelets: 264 10*3/uL (ref 150–400)
RBC: 4.18 MIL/uL (ref 3.87–5.11)
RDW: 18.4 % — ABNORMAL HIGH (ref 11.5–15.5)
WBC: 6.3 10*3/uL (ref 4.0–10.5)
nRBC: 0 % (ref 0.0–0.2)

## 2022-11-25 LAB — COMPREHENSIVE METABOLIC PANEL
ALT: 19 U/L (ref 0–44)
AST: 20 U/L (ref 15–41)
Albumin: 3.9 g/dL (ref 3.5–5.0)
Alkaline Phosphatase: 61 U/L (ref 38–126)
Anion gap: 8 (ref 5–15)
BUN: 19 mg/dL (ref 8–23)
CO2: 25 mmol/L (ref 22–32)
Calcium: 9 mg/dL (ref 8.9–10.3)
Chloride: 103 mmol/L (ref 98–111)
Creatinine, Ser: 0.73 mg/dL (ref 0.44–1.00)
GFR, Estimated: >60 mL/min (ref >60)
Glucose, Bld: 106 mg/dL — ABNORMAL HIGH (ref 70–99)
Potassium: 3.5 mmol/L (ref 3.5–5.1)
Sodium: 136 mmol/L (ref 135–145)
Total Bilirubin: 0.7 mg/dL (ref 0.3–1.2)
Total Protein: 6.6 g/dL (ref 6.5–8.1)

## 2022-11-25 LAB — MAGNESIUM: Magnesium: 1.9 mg/dL (ref 1.7–2.4)

## 2022-11-25 MED ORDER — ZOLEDRONIC ACID 4 MG/100ML IV SOLN
4.0000 mg | Freq: Once | INTRAVENOUS | Status: AC
  Administered 2022-11-25: 4 mg via INTRAVENOUS
  Filled 2022-11-25: qty 100

## 2022-11-25 MED ORDER — SODIUM CHLORIDE 0.9 % IV SOLN: Freq: Once | INTRAVENOUS | Status: AC

## 2022-11-25 NOTE — Progress Notes (Signed)
Patient states she is not having any dental issues today. Patient states she is taking her calcium and vitamin D.   Zometa infusion given per orders. Patient tolerated it well without problems. Vitals stable and discharged home from clinic via wheelchair. Follow up as scheduled.

## 2022-11-25 NOTE — Patient Instructions (Signed)
[facility]  Discharge Instructions: Thank you for choosing [facility] to provide your oncology and hematology care.  If you have a lab appointment with the Cancer Center - please note that after April 8th, 2024, all labs will be drawn in the cancer center.  You do not have to check in or register with the main entrance as you have in the past but will complete your check-in in the cancer center.  Wear comfortable clothing and clothing appropriate for easy access to any Portacath or PICC line.   We strive to give you quality time with your provider. You may need to reschedule your appointment if you arrive late (15 or more minutes).  Arriving late affects you and other patients whose appointments are after yours.  Also, if you miss three or more appointments without notifying the office, you may be dismissed from the clinic at the provider's discretion.      For prescription refill requests, have your pharmacy contact our office and allow 72 hours for refills to be completed.    Today you received the following, zometa infusion   To help prevent nausea and vomiting after your treatment, we encourage you to take your nausea medication as directed.  BELOW ARE SYMPTOMS THAT SHOULD BE REPORTED IMMEDIATELY: *FEVER GREATER THAN 100.4 F (38 C) OR HIGHER *CHILLS OR SWEATING *NAUSEA AND VOMITING THAT IS NOT CONTROLLED WITH YOUR NAUSEA MEDICATION *UNUSUAL SHORTNESS OF BREATH *UNUSUAL BRUISING OR BLEEDING *URINARY PROBLEMS (pain or burning when urinating, or frequent urination) *BOWEL PROBLEMS (unusual diarrhea, constipation, pain near the anus) TENDERNESS IN MOUTH AND THROAT WITH OR WITHOUT PRESENCE OF ULCERS (sore throat, sores in mouth, or a toothache) UNUSUAL RASH, SWELLING OR PAIN  UNUSUAL VAGINAL DISCHARGE OR ITCHING   Items with * indicate a potential emergency and should be followed up as soon as possible or go to the Emergency Department if any problems  should occur.  Please show the CHEMOTHERAPY ALERT CARD or IMMUNOTHERAPY ALERT CARD at check-in to the Emergency Department and triage nurse.  Should you have questions after your visit or need to cancel or reschedule your appointment, please contact [facility] [phone]  and follow the prompts.  Office hours are 8:00 a.m. to 4:30 p.m. Monday - Friday. Please note that voicemails left after 4:00 p.m. may not be returned until the following business day.  We are closed weekends and major holidays. You have access to a nurse at all times for urgent questions. Please call the main number to the clinic [phone] and follow the prompts.  For any non-urgent questions, you may also contact your provider using MyChart. We now offer e-Visits for anyone 60 and older to request care online for non-urgent symptoms. For details visit [email or web address].   Also download the MyChart app! Go to the app store, search "MyChart", open the app, select , and log in with your MyChart username and password.

## 2022-12-08 ENCOUNTER — Other Ambulatory Visit: Payer: Self-pay

## 2022-12-16 ENCOUNTER — Other Ambulatory Visit: Payer: Self-pay

## 2022-12-16 MED ORDER — LENALIDOMIDE 10 MG PO CAPS: 10.0000 mg | ORAL_CAPSULE | Freq: Every day | ORAL | 0 refills | Status: DC

## 2022-12-16 NOTE — Telephone Encounter (Signed)
Chart reviewed. Revlimid refilled per last office note with Dr. Katragadda.  

## 2022-12-20 ENCOUNTER — Other Ambulatory Visit: Payer: Self-pay

## 2022-12-20 DIAGNOSIS — C9000 Multiple myeloma not having achieved remission: Secondary | ICD-10-CM

## 2022-12-22 NOTE — Progress Notes (Signed)
[facility] [address], [place] [address]    Clinic Day:  12/22/2022  Referring physician: Maximiano Coss,*  Patient Care Team: Maximiano Coss, MD as PCP - General (Family Medicine) Doreatha Massed, MD as Medical Oncologist (Medical Oncology)   ASSESSMENT & PLAN:   Assessment: 1. 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 Jacklynn Ganong - She reported discomfort in the left posterior back for the last 1 month.  She was seen by Dr. Leary Roca and x-rays showed abnormality.  A CT scan of the chest was done on 05/24/2021. - CT chest report reviewed by me from 05/24/2021 showed expansile oval-shaped lesion within the posterior aspect of the seventh rib with a nodule measuring 2.1 x 1.1 cm.  Adjacent cortex demonstrates area of thinning and destruction.  Stable emphysematous and interstitial changes within the lungs.  Stable pulmonary nodules within the right upper lobe and left upper lobe when compared to CT from September 2020. - CT of the abdomen and pelvis did not show any significant abnormalities. - She denies any fevers, night sweats or weight loss in the last 6 months.  She had history of basal cell carcinoma on the nose removed by Mohs surgery. - She also reported history of osteoporosis and broke left upper rib 1 year ago after sneezing. -Reviewed PET scan from 06/14/2021.  Mild hypermetabolism involving left aspect of the L5 vertebral body and also pedicle region surrounding the screw.  SUV 4.2.  There appears to be a lytic process on the CT scan.  The lytic left posterior seventh rib lesion is mildly hypermetabolic with SUV 2.98.  No other definite lytic hypermetabolic bone lesions seen. - We have reviewed left seventh rib bone biopsy from 06/25/2021 consistent with plasma cell neoplasm.  Cells are positive for CD138, CD56 and a kappa restricted.  - Labs on 06/13/2021 M spike 1.7 g.  Immunofixation  IgA kappa.  Free light chain shows kappa light chain 71, ratio 8.22.  LDH and beta-2 microglobulin was normal. - Bone marrow biopsy on 07/04/2021 consistent with plasma cell neoplasm with 26% plasma cells on aspirate with atypical features and 50 to 60% on core biopsy.  Chromosome analysis was normal.  Multiple myeloma FISH panel shows loss of NF1/chromosome 17/17 q.  Gain of CCN D1 or chromosome 11/11 q.  Gain of chromosome 4/4P.  Loss of material from the long-arm of chromosome 13.  These are all standard risk features. - 24-hour urine total protein to 40 mg.  Urine immunofixation was negative.  Urine kappa light chains were 4.13. - 8 cycles of Dara RVD from 07/26/2021 through 03/01/2022 - T11 and T12 vertebroplasty by Dr. Jake Samples on 08/31/2021. - She has decided against bone marrow transplant after the advice of Dr. Valentina Lucks.   2. Social/family history: - She is married and lives at home with her husband.  She has not worked but had help her husband and his work.  She is a non-smoker. - Maternal grandmother had colon cancer.  Mother had a squamous cell carcinoma of the skin.    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 - She reports nausea on and off for 1 week.  She takes Zofran daily.  She has constipation alternating with loose stools.  She has more joint pains from coughing. - Myeloma panel from 09/27/2022 with M spike not observed.  Light chain  ratio is normal. - Reviewed labs from today which shows normal CBC.  LFTs and creatinine are normal. - Continue Revlimid 10 mg 3 weeks on/1 week off.  RTC 8 weeks for follow-up with repeat myeloma labs.  2.  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  No antibiotics in the last 1 month. - Recent  cultures showed MAI, blastobotrys raffinase, scedosporium, and paecilomyces.  She has an appointment  to see Dr. Valentina Lucks next Monday.  3.  ID prophylaxis: - Valtrex was discontinued after stopping Darzalex.  4.  Myeloma bone disease: - Calcium is 8.7.  Proceed with Zometa today and in 4 weeks.    No orders of the defined types were placed in this encounter.     I,Katie Daubenspeck,acting as a scribe for Doreatha Massed, MD.,have documented all relevant documentation on the behalf of Doreatha Massed, MD,as directed by  Doreatha Massed, MD while in the presence of Doreatha Massed, MD.   ***  Katie Daubenspeck   6/9/20249:57 PM  CHIEF COMPLAINT:   Diagnosis: multiple myeloma   Cancer Staging  Multiple myeloma (HCC) Staging form: Multiple Myeloma, AJCC 6th Edition - Clinical stage from 07/17/2021: Stage IA - Unsigned    Prior Therapy: DaraVRd (Daratumumab SQ) q21d x 6 Cycles (Induction/Consolidation)   Current Therapy:  Revlimid maintenance    HISTORY OF PRESENT ILLNESS:   Oncology History  Multiple myeloma (HCC)  07/17/2021 Initial Diagnosis   Multiple myeloma (HCC)   07/26/2021 - 03/15/2022 Chemotherapy   Patient is on Treatment Plan : MYELOMA NEWLY DIAGNOSED TRANSPLANT CANDIDATE DaraVRd (Daratumumab SQ) q21d x 6 Cycles (Induction/Consolidation)     04/17/2022 -  Chemotherapy   Patient is on Treatment Plan : MYELOMA NEWLY DIAGNOSED TRANSPLANT CANDIDATE Daratumumab SQ + Lenalidomide q28d (Maintenance)        INTERVAL HISTORY:   Amanda Conner is a 72 y.o. female presenting to clinic today for follow up of multiple myeloma. She was last seen by me on 10/28/22.  Today, she states that she is doing well overall. Her appetite level is at ***%. Her energy level is at ***%.  PAST MEDICAL HISTORY:   Past Medical History: Past Medical History:  Diagnosis Date   Arthritis    osteoarthritis   Back pain    Bronchiectasis (HCC)    Cancer (HCC)    basal cell nose   Chronic cough    Complication of anesthesia    Dyspnea    Gastrointestinal symptoms    GERD  (gastroesophageal reflux disease)    Glaucoma    History of hiatal hernia    Hyperlipemia    Hypertension    Multiple myeloma (HCC)    Osteopenia    Pneumonia    PONV (postoperative nausea and vomiting)    "only one time"   Spondylisthesis     Surgical History: Past Surgical History:  Procedure Laterality Date   ABDOMINAL EXPOSURE N/A 12/29/2018   Procedure: ABDOMINAL EXPOSURE;  Surgeon: Chuck Hint, MD;  Location: [facility] OR;  Service: Vascular;  Laterality: N/A;   ABDOMINOPLASTY  2002   ABDOMINOPLASTY  1970's   ANKLE SURGERY Left 10/2015   ANTERIOR LATERAL LUMBAR FUSION WITH PERCUTANEOUS SCREW 3 LEVEL Left 12/29/2018   Procedure: Lumbar Two to Lumbar Five Anterolateral lumbar interbody fusion;  Surgeon: Maeola Harman, MD;  Location: [facility] OR;  Service: Neurosurgery;  Laterality: Left;  anterolateral   ANTERIOR LUMBAR FUSION N/A 12/29/2018   Procedure:  Lumbar Five Sacral One Anterior lumbar interbody fusion;  Surgeon: Maeola Harman, MD;  Location: [facility] OR;  Service: Neurosurgery;  Laterality: N/A;  anterior approach   BASAL CELL CARCINOMA EXCISION  06/2018   nose   BLEPHAROPLASTY Bilateral 1999   BREAST ENHANCEMENT SURGERY  1970's   COLONOSCOPY     CYSTOURETHROSCOPY  2018   Doble J ureteal stents   DECOMPRESSION CORE HIP Left 2014   FACIAL COSMETIC SURGERY  2004   FLUOROSCOPY GUIDANCE     HIP ARTHROPLASTY Left    INCISIONAL HERNIA REPAIR N/A 10/08/2019   Procedure: OPEN INCISIONAL HERNIA REPAIR WITH MESH;  Surgeon: Darnell Level, MD;  Location: [facility] ORS;  Service: General;  Laterality: N/A;   JOINT REPLACEMENT Left    Hip; 05/2014   KYPHOPLASTY N/A 08/31/2021   Procedure: KYPHOPLASTY THORACIC ELEVEN, THORACIC TWELVE;  Surgeon: Bethann Goo, DO;  Location: [facility] OR;  Service: Neurosurgery;  Laterality: N/A;   LAPAROSCOPIC PARTIAL COLECTOMY  06/26/2017   LUMBAR PERCUTANEOUS PEDICLE SCREW 4 LEVEL N/A 12/29/2018   Procedure: Lumbar Percutaneous Pedicle Screw Placement Lumbar  two-Sacral one;  Surgeon: Maeola Harman, MD;  Location: [facility] OR;  Service: Neurosurgery;  Laterality: N/A;   MOHS SURGERY  2019   nose   PARTIAL COLECTOMY  06/2017   for diverticulitis   REMOVAL OF BILATERAL TISSUE EXPANDERS WITH PLACEMENT OF BILATERAL BREAST IMPLANTS  2004   THIGH LIFT  1994   TOE FUSION Right    great toe   TONSILLECTOMY     UMBILICAL HERNIA REPAIR     with tummy tuck   UMBILICAL HERNIA REPAIR  2002    Social History: Social History   Socioeconomic History   Marital status: Married    Spouse name: Not on file   Number of children: Not on file   Years of education: Not on file   Highest education level: Not on file  Occupational History   Not on file  Tobacco Use   Smoking status: Never   Smokeless tobacco: Never  Vaping Use   Vaping Use: Never used  Substance and Sexual Activity   Alcohol use: Yes    Comment: occasional   Drug use: Never   Sexual activity: Not on file  Other Topics Concern   Not on file  Social History Narrative   Not on file   Social Determinants of Health   Financial Resource Strain: Low Risk  (07/24/2021)   Overall Financial Resource Strain (CARDIA)    Difficulty of Paying Living Expenses: Not hard at all  Food Insecurity: No Food Insecurity (07/24/2021)   Hunger Vital Sign    Worried About Running Out of Food in the Last Year: Never true    Ran Out of Food in the Last Year: Never true  Transportation Needs: No Transportation Needs (07/24/2021)   PRAPARE - Administrator, Civil Service (Medical): No    Lack of Transportation (Non-Medical): No  Physical Activity: Not on file  Stress: Not on file  Social Connections: Socially Integrated (07/24/2021)   Social Connection and Isolation Panel [NHANES]    Frequency of Communication with Friends and Family: More than three times a week    Frequency of Social Gatherings with Friends and Family: More than three times a week    Attends Religious Services: 1 to 4 times per  year    Active Member of [employer] or Organizations: No    Attends Banker Meetings: 1 to 4 times per year  Marital Status: Married  Catering manager Violence: Not on file    Family History: Family History  Problem Relation Age of Onset   Hypertension Mother    COPD Mother    Hypertension Father    Heart disease Father     Current Medications:  Current Outpatient Medications:    albuterol (VENTOLIN HFA) 108 (90 Base) MCG/ACT inhaler, Inhale 2 puffs into the lungs every 6 (six) hours as needed for wheezing or shortness of breath., Disp: , Rfl:    ANORO ELLIPTA 62.5-25 MCG/ACT AEPB, Inhale 1 puff into the lungs daily., Disp: , Rfl:    aspirin EC 81 MG tablet, Take 81 mg by mouth daily. Swallow whole., Disp: , Rfl:    Baclofen 5 MG TABS, Take 1 tablet by mouth 3 (three) times daily as needed., Disp: , Rfl:    benzonatate (TESSALON) 100 MG capsule, Take 100 mg by mouth 3 (three) times daily as needed for cough., Disp: , Rfl:    Biotin 1000 MCG tablet, Take 1,000 mcg by mouth daily., Disp: , Rfl:    calcitonin, salmon, (MIACALCIN/FORTICAL) 200 UNIT/ACT nasal spray, Place 1 spray into alternate nostrils daily., Disp: , Rfl:    Calcium Carb-Cholecalciferol 600-800 MG-UNIT TABS, Take 1 tablet by mouth daily., Disp: , Rfl:    daratumumab-hyaluronidase-fihj (DARZALEX FASPRO) 1800-30000 MG-UT/15ML SOLN, Inject 1,800 mg into the skin once a week., Disp: , Rfl:    dexamethasone (DECADRON) 2 MG tablet, Take 5 tablets (10 mg total) by mouth every 30 (thirty) days., Disp: 20 tablet, Rfl: 4   dextromethorphan-guaiFENesin (MUCINEX DM) 30-600 MG 12hr tablet, Take 1 tablet by mouth daily. , Disp: , Rfl:    estrogen, conjugated,-medroxyprogesterone (PREMPRO) 0.45-1.5 MG tablet, Take 1 tablet by mouth daily., Disp: , Rfl:    feeding supplement (ENSURE ENLIVE / ENSURE PLUS) LIQD, Take 237 mLs by mouth 2 (two) times daily between meals. (Patient taking differently: Take 237 mLs by mouth daily.),  Disp: 237 mL, Rfl: 12   fluticasone (FLONASE) 50 MCG/ACT nasal spray, Place 1 spray into both nostrils daily., Disp: , Rfl:    furosemide (LASIX) 20 MG tablet, TAKE 1 TABLET BY MOUTH EVERY DAY AS NEEDED, Disp: 90 tablet, Rfl: 3   hydrochlorothiazide (HYDRODIURIL) 25 MG tablet, Take 25 mg by mouth daily as needed (swelling)., Disp: , Rfl:    HYDROcodone bit-homatropine (HYCODAN) 5-1.5 MG/5ML syrup, Take 5 mLs by mouth every 6 (six) hours as needed for cough., Disp: 473 mL, Rfl: 0   Lactulose 20 GM/30ML SOLN, Take 30 ml by mouth every 3 hours until bowel movement is had, then take 30 ml daily (Patient taking differently: Take 20 g by mouth daily as needed (constipation).), Disp: 946 mL, Rfl: 3   latanoprost (XALATAN) 0.005 % ophthalmic solution, Place 1 drop into both eyes at bedtime. , Disp: , Rfl:    lenalidomide (REVLIMID) 10 MG capsule, Take 1 capsule (10 mg total) by mouth daily. 21 days on, 7 days off, Disp: 21 capsule, Rfl: 0   linaclotide (LINZESS) 145 MCG CAPS capsule, Take 145 mcg by mouth daily as needed (constipation)., Disp: , Rfl:    lisinopril (ZESTRIL) 20 MG tablet, Take 20 mg by mouth daily., Disp: , Rfl:    loratadine (CLARITIN) 10 MG tablet, Take 10 mg by mouth daily., Disp: , Rfl:    loteprednol (LOTEMAX) 0.5 % ophthalmic suspension, Place into the left eye., Disp: , Rfl:    methocarbamol (ROBAXIN) 500 MG tablet, Take 1 tablet (500 mg total)  by mouth every 6 (six) hours as needed for muscle spasms., Disp: 40 tablet, Rfl: 3   omeprazole (PRILOSEC) 20 MG capsule, Take 20 mg by mouth daily., Disp: , Rfl:    ondansetron (ZOFRAN ODT) 4 MG disintegrating tablet, Take 1 tablet (4 mg total) by mouth every 4 (four) hours as needed for nausea or vomiting., Disp: 60 tablet, Rfl: 3   Oxycodone HCl 10 MG TABS, Take 1 tablet (10 mg total) by mouth every 6 (six) hours as needed., Disp: 120 tablet, Rfl: 0   pantoprazole (PROTONIX) 40 MG tablet, Take 40 mg by mouth daily., Disp: , Rfl:     predniSONE (DELTASONE) 10 MG tablet, TAKE 6 TABLETS ON DAY 1 AS DIRECTED ON PACKAGE AND DECREASE BY 1 TAB EACH DAY FOR A TOTAL OF 6 DAYS, Disp: , Rfl:    sucralfate (CARAFATE) 1 g tablet, Take 1 g by mouth 4 (four) times daily., Disp: , Rfl:    SUMAtriptan (IMITREX) 100 MG tablet, Take 100 mg by mouth every 2 (two) hours as needed for migraine. May repeat in 2 hours if headache persists or recurs., Disp: , Rfl:    tiZANidine (ZANAFLEX) 2 MG tablet, Take 2 mg by mouth every 6 (six) hours as needed., Disp: , Rfl:    UNABLE TO FIND, Place 0.5 g into both eyes 3 (three) times daily. Med Name: Loteman (otepredol), Disp: , Rfl:    Allergies: Allergies  Allergen Reactions   Tobramycin-Dexamethasone Other (See Comments)    Itching and swelling   Ethambutol Other (See Comments)    Fever   Neomycin-Polymyxin-Dexameth Itching and Swelling   Amikacin Other (See Comments)    Eosinophilia with chills and aching when given with cefoxitin   Biaxin [Clarithromycin] Itching   Cefoxitin Other (See Comments)    Eosinophlia when given with amikacin   Sulfamethoxazole-Trimethoprim Itching   Vancomycin     "Got red splotches on skin after several doses"   Bacitracin-Polymyxin B Rash    REVIEW OF SYSTEMS:   Review of Systems  Constitutional:  Negative for chills, fatigue and fever.  HENT:   Negative for lump/mass, mouth sores, nosebleeds, sore throat and trouble swallowing.   Eyes:  Negative for eye problems.  Respiratory:  Negative for cough and shortness of breath.   Cardiovascular:  Negative for chest pain, leg swelling and palpitations.  Gastrointestinal:  Negative for abdominal pain, constipation, diarrhea, nausea and vomiting.  Genitourinary:  Negative for bladder incontinence, difficulty urinating, dysuria, frequency, hematuria and nocturia.   Musculoskeletal:  Negative for arthralgias, back pain, flank pain, myalgias and neck pain.  Skin:  Negative for itching and rash.  Neurological:  Negative  for dizziness, headaches and numbness.  Hematological:  Does not bruise/bleed easily.  Psychiatric/Behavioral:  Negative for depression, sleep disturbance and suicidal ideas. The patient is not nervous/anxious.   All other systems reviewed and are negative.    VITALS:   There were no vitals taken for this visit.  Wt Readings from Last 3 Encounters:  10/28/22 112 lb 11.2 oz (51.1 kg)  09/27/22 111 lb 14.4 oz (50.8 kg)  08/29/22 112 lb 6.4 oz (51 kg)    There is no height or weight on file to calculate BMI.  Performance status (ECOG): {CHL ONC [id number]  PHYSICAL EXAM:   Physical Exam Vitals and nursing note reviewed. Exam conducted with a chaperone present.  Constitutional:      Appearance: Normal appearance.  Cardiovascular:     Rate and Rhythm: Normal rate and  regular rhythm.     Pulses: Normal pulses.     Heart sounds: Normal heart sounds.  Pulmonary:     Effort: Pulmonary effort is normal.     Breath sounds: Normal breath sounds.  Abdominal:     Palpations: Abdomen is soft. There is no hepatomegaly, splenomegaly or mass.     Tenderness: There is no abdominal tenderness.  Musculoskeletal:     Right lower leg: No edema.     Left lower leg: No edema.  Lymphadenopathy:     Cervical: No cervical adenopathy.     Right cervical: No superficial, deep or posterior cervical adenopathy.    Left cervical: No superficial, deep or posterior cervical adenopathy.     Upper Body:     Right upper body: No supraclavicular or axillary adenopathy.     Left upper body: No supraclavicular or axillary adenopathy.  Neurological:     General: No focal deficit present.     Mental Status: She is alert and oriented to person, place, and time.  Psychiatric:        Mood and Affect: Mood normal.        Behavior: Behavior normal.     LABS:      Latest Ref Rng & Units 11/25/2022    8:06 AM 10/28/2022   10:19 AM 09/27/2022    8:28 AM  CBC  WBC 4.0 - 10.5 K/uL 6.3  5.9  5.7    Hemoglobin 12.0 - 15.0 g/dL 96.0  45.4  09.8   Hematocrit 36.0 - 46.0 % 37.7  38.9  36.5   Platelets 150 - 400 K/uL 264  272  273       Latest Ref Rng & Units 11/25/2022    8:06 AM 10/28/2022   10:19 AM 09/27/2022    8:28 AM  CMP  Glucose 70 - 99 mg/dL 119  99  91   BUN 8 - 23 mg/dL 19  10  12    Creatinine 0.44 - 1.00 mg/dL 1.47  8.29  5.62   Sodium 135 - 145 mmol/L 136  133  137   Potassium 3.5 - 5.1 mmol/L 3.5  3.5  3.9   Chloride 98 - 111 mmol/L 103  101  100   CO2 22 - 32 mmol/L 25  25  27    Calcium 8.9 - 10.3 mg/dL 9.0  8.7  8.7   Total Protein 6.5 - 8.1 g/dL 6.6  7.3  6.5   Total Bilirubin 0.3 - 1.2 mg/dL 0.7  0.5  0.5   Alkaline Phos 38 - 126 U/L 61  68  65   AST 15 - 41 U/L 20  23  20    ALT 0 - 44 U/L 19  22  20       No results found for: "CEA1", "CEA" / No results found for: "CEA1", "CEA" No results found for: "PSA1" No results found for: "CAN199" No results found for: "CAN125"  Lab Results  Component Value Date   TOTALPROTELP 6.8 10/28/2022   ALBUMINELP 4.0 10/28/2022   A1GS 0.4 10/28/2022   A2GS 0.8 10/28/2022   BETS 1.0 10/28/2022   GAMS 0.6 10/28/2022   MSPIKE Not Observed 10/28/2022   SPEI Comment 10/28/2022   No results found for: "TIBC", "FERRITIN", "IRONPCTSAT" Lab Results  Component Value Date   LDH 162 04/17/2022   LDH 140 03/15/2022   LDH 143 02/14/2022     STUDIES:   No results found.

## 2022-12-23 ENCOUNTER — Inpatient Hospital Stay: Payer: Medicare Other

## 2022-12-23 ENCOUNTER — Inpatient Hospital Stay: Payer: Medicare Other | Attending: Hematology | Admitting: Hematology

## 2022-12-23 VITALS — BP 107/53 | HR 60 | Temp 96.8°F | Resp 18 | Ht 62.0 in | Wt 107.0 lb

## 2022-12-23 DIAGNOSIS — R197 Diarrhea, unspecified: Secondary | ICD-10-CM

## 2022-12-23 DIAGNOSIS — C9000 Multiple myeloma not having achieved remission: Secondary | ICD-10-CM | POA: Diagnosis present

## 2022-12-23 LAB — COMPREHENSIVE METABOLIC PANEL
ALT: 26 U/L (ref 0–44)
AST: 24 U/L (ref 15–41)
Albumin: 4.1 g/dL (ref 3.5–5.0)
Alkaline Phosphatase: 67 U/L (ref 38–126)
Anion gap: 8 (ref 5–15)
BUN: 20 mg/dL (ref 8–23)
CO2: 26 mmol/L (ref 22–32)
Calcium: 8.9 mg/dL (ref 8.9–10.3)
Chloride: 98 mmol/L (ref 98–111)
Creatinine, Ser: 0.82 mg/dL (ref 0.44–1.00)
GFR, Estimated: >60 mL/min (ref >60)
Glucose, Bld: 98 mg/dL (ref 70–99)
Potassium: 4.4 mmol/L (ref 3.5–5.1)
Sodium: 132 mmol/L — ABNORMAL LOW (ref 135–145)
Total Bilirubin: 0.5 mg/dL (ref 0.3–1.2)
Total Protein: 6.9 g/dL (ref 6.5–8.1)

## 2022-12-23 LAB — CBC WITH DIFFERENTIAL/PLATELET
Abs Immature Granulocytes: 0.02 10*3/uL (ref 0.00–0.07)
Basophils Absolute: 0.1 10*3/uL (ref 0.0–0.1)
Basophils Relative: 1 %
Eosinophils Absolute: 0.4 10*3/uL (ref 0.0–0.5)
Eosinophils Relative: 6 %
HCT: 36.6 % (ref 36.0–46.0)
Hemoglobin: 11.6 g/dL — ABNORMAL LOW (ref 12.0–15.0)
Immature Granulocytes: 0 %
Lymphocytes Relative: 25 %
Lymphs Abs: 1.5 10*3/uL (ref 0.7–4.0)
MCH: 27.8 pg (ref 26.0–34.0)
MCHC: 31.7 g/dL (ref 30.0–36.0)
MCV: 87.8 fL (ref 80.0–100.0)
Monocytes Absolute: 0.7 10*3/uL (ref 0.1–1.0)
Monocytes Relative: 12 %
Neutro Abs: 3.4 10*3/uL (ref 1.7–7.7)
Neutrophils Relative %: 56 %
Platelets: 240 10*3/uL (ref 150–400)
RBC: 4.17 MIL/uL (ref 3.87–5.11)
RDW: 18.6 % — ABNORMAL HIGH (ref 11.5–15.5)
WBC: 6 10*3/uL (ref 4.0–10.5)
nRBC: 0 % (ref 0.0–0.2)

## 2022-12-23 LAB — MAGNESIUM: Magnesium: 1.8 mg/dL (ref 1.7–2.4)

## 2022-12-23 MED ORDER — SODIUM CHLORIDE 0.9 % IV SOLN: Freq: Once | INTRAVENOUS | Status: AC

## 2022-12-23 MED ORDER — ZOLEDRONIC ACID 4 MG/100ML IV SOLN
4.0000 mg | Freq: Once | INTRAVENOUS | Status: AC
  Administered 2022-12-23: 4 mg via INTRAVENOUS
  Filled 2022-12-23: qty 100

## 2022-12-23 NOTE — Patient Instructions (Signed)
[facility]  Discharge Instructions: Thank you for choosing [facility] to provide your oncology and hematology care.  If you have a lab appointment with the Cancer Center - please note that after April 8th, 2024, all labs will be drawn in the cancer center.  You do not have to check in or register with the main entrance as you have in the past but will complete your check-in in the cancer center.  Wear comfortable clothing and clothing appropriate for easy access to any Portacath or PICC line.   We strive to give you quality time with your provider. You may need to reschedule your appointment if you arrive late (15 or more minutes).  Arriving late affects you and other patients whose appointments are after yours.  Also, if you miss three or more appointments without notifying the office, you may be dismissed from the clinic at the provider's discretion.      For prescription refill requests, have your pharmacy contact our office and allow 72 hours for refills to be completed.    Today you received Zometa 4mg  IV infusion   BELOW ARE SYMPTOMS THAT SHOULD BE REPORTED IMMEDIATELY: *FEVER GREATER THAN 100.4 F (38 C) OR HIGHER *CHILLS OR SWEATING *NAUSEA AND VOMITING THAT IS NOT CONTROLLED WITH YOUR NAUSEA MEDICATION *UNUSUAL SHORTNESS OF BREATH *UNUSUAL BRUISING OR BLEEDING *URINARY PROBLEMS (pain or burning when urinating, or frequent urination) *BOWEL PROBLEMS (unusual diarrhea, constipation, pain near the anus) TENDERNESS IN MOUTH AND THROAT WITH OR WITHOUT PRESENCE OF ULCERS (sore throat, sores in mouth, or a toothache) UNUSUAL RASH, SWELLING OR PAIN  UNUSUAL VAGINAL DISCHARGE OR ITCHING   Items with * indicate a potential emergency and should be followed up as soon as possible or go to the Emergency Department if any problems should occur.  Please show the CHEMOTHERAPY ALERT CARD or IMMUNOTHERAPY ALERT CARD at check-in to the Emergency Department and  triage nurse.  Should you have questions after your visit or need to cancel or reschedule your appointment, please contact [facility] [phone]  and follow the prompts.  Office hours are 8:00 a.m. to 4:30 p.m. Monday - Friday. Please note that voicemails left after 4:00 p.m. may not be returned until the following business day.  We are closed weekends and major holidays. You have access to a nurse at all times for urgent questions. Please call the main number to the clinic [phone] and follow the prompts.  For any non-urgent questions, you may also contact your provider using MyChart. We now offer e-Visits for anyone 58 and older to request care online for non-urgent symptoms. For details visit [email or web address].   Also download the MyChart app! Go to the app store, search "MyChart", open the app, select [facility], and log in with your MyChart username and password.

## 2022-12-23 NOTE — Patient Instructions (Signed)
[facility] at [facility] Discharge Instructions   You were seen and examined today by Dr. Katragadda.  He reviewed the results of your lab work which are normal/stable.   We will proceed with your treatment today.  Return as scheduled.    Thank you for choosing [facility] at [facility] to provide your oncology and hematology care.  To afford each patient quality time with our provider, please arrive at least 15 minutes before your scheduled appointment time.   If you have a lab appointment with the Cancer Center please come in thru the Main Entrance and check in at the main information desk.  You need to re-schedule your appointment should you arrive 10 or more minutes late.  We strive to give you quality time with our providers, and arriving late affects you and other patients whose appointments are after yours.  Also, if you no show three or more times for appointments you may be dismissed from the clinic at the providers discretion.     Again, thank you for choosing [facility].  Our hope is that these requests will decrease the amount of time that you wait before being seen by our physicians.       _____________________________________________________________  Should you have questions after your visit to [facility], please contact our office at [phone] and follow the prompts.  Our office hours are 8:00 a.m. and 4:30 p.m. Monday - Friday.  Please note that voicemails left after 4:00 p.m. may not be returned until the following business day.  We are closed weekends and major holidays.  You do have access to a nurse 24-7, just call the main number to the clinic [phone] and do not press any options, hold on the line and a nurse will answer the phone.    For prescription refill requests, have your pharmacy contact our office and allow 72 hours.    Due to Covid, you will need to wear a mask upon entering  the hospital. If you do not have a mask, a mask will be given to you at the Main Entrance upon arrival. For doctor visits, patients may have 1 support person age 18 or older with them. For treatment visits, patients can not have anyone with them due to social distancing guidelines and our immunocompromised population.      

## 2022-12-23 NOTE — Progress Notes (Signed)
Patient has been examined by Dr. Katragadda. Vital signs and labs have been reviewed by MD - ANC, Creatinine, LFTs, hemoglobin, and platelets are within treatment parameters per M.D. - pt may proceed with treatment.  Primary RN and pharmacy notified.  

## 2022-12-23 NOTE — Progress Notes (Signed)
Pt presents today for Zometa 4mg  IV per provider's order. Vital signs stable and pt voiced no new complaints at this time.  Peripheral IV started with good blood return pre and post infusion. Pt denies tooth or jaw pain and no recent or future dental appointments at this time. Pt reports Calcium and Vit D supplements as directed.  Treatment given today per MD orders. Tolerated infusion without adverse affects. Vital signs stable. No complaints at this time. Discharged from clinic ambulatory in stable condition. Alert and oriented x 3. F/U with [facility] as scheduled.

## 2022-12-24 ENCOUNTER — Other Ambulatory Visit: Payer: Self-pay

## 2022-12-25 LAB — KAPPA/LAMBDA LIGHT CHAINS
Kappa free light chain: 15.4 mg/L (ref 3.3–19.4)
Kappa, lambda light chain ratio: 1.45 (ref 0.26–1.65)
Lambda free light chains: 10.6 mg/L (ref 5.7–26.3)

## 2022-12-26 LAB — PROTEIN ELECTROPHORESIS, SERUM
A/G Ratio: 1.5 (ref 0.7–1.7)
Albumin ELP: 3.9 g/dL (ref 2.9–4.4)
Alpha-1-Globulin: 0.3 g/dL (ref 0.0–0.4)
Alpha-2-Globulin: 0.7 g/dL (ref 0.4–1.0)
Beta Globulin: 1 g/dL (ref 0.7–1.3)
Gamma Globulin: 0.6 g/dL (ref 0.4–1.8)
Globulin, Total: 2.6 g/dL (ref 2.2–3.9)
Total Protein ELP: 6.5 g/dL (ref 6.0–8.5)

## 2022-12-27 ENCOUNTER — Other Ambulatory Visit (HOSPITAL_COMMUNITY)
Admission: RE | Admit: 2022-12-27 | Discharge: 2022-12-27 | Disposition: A | Discharge status: Home / Self Care | Payer: Medicare Other | Source: Ambulatory Visit | Attending: Hematology | Admitting: Hematology

## 2022-12-27 DIAGNOSIS — R197 Diarrhea, unspecified: Secondary | ICD-10-CM

## 2022-12-29 ENCOUNTER — Other Ambulatory Visit: Payer: Self-pay | Admitting: Hematology

## 2022-12-30 ENCOUNTER — Encounter: Payer: Self-pay | Admitting: Hematology

## 2022-12-30 ENCOUNTER — Encounter (HOSPITAL_COMMUNITY): Payer: Self-pay | Admitting: Hematology

## 2023-01-09 ENCOUNTER — Other Ambulatory Visit: Payer: Self-pay

## 2023-01-10 ENCOUNTER — Other Ambulatory Visit: Payer: Self-pay

## 2023-01-10 MED ORDER — LENALIDOMIDE 10 MG PO CAPS: 10.0000 mg | ORAL_CAPSULE | Freq: Every day | ORAL | 0 refills | Status: DC

## 2023-01-10 NOTE — Telephone Encounter (Signed)
Chart reviewed. Revlimid refilled per last office note with Dr. Katragadda.  

## 2023-01-15 ENCOUNTER — Other Ambulatory Visit: Payer: Self-pay

## 2023-01-15 DIAGNOSIS — C9000 Multiple myeloma not having achieved remission: Secondary | ICD-10-CM

## 2023-01-20 ENCOUNTER — Inpatient Hospital Stay: Payer: Medicare Other

## 2023-01-20 ENCOUNTER — Inpatient Hospital Stay: Payer: Medicare Other | Attending: Hematology

## 2023-01-20 VITALS — BP 113/74 | HR 79 | Temp 97.9°F | Resp 18 | Wt 106.4 lb

## 2023-01-20 DIAGNOSIS — C9000 Multiple myeloma not having achieved remission: Secondary | ICD-10-CM | POA: Diagnosis not present

## 2023-01-20 DIAGNOSIS — R197 Diarrhea, unspecified: Secondary | ICD-10-CM | POA: Insufficient documentation

## 2023-01-20 LAB — COMPREHENSIVE METABOLIC PANEL
ALT: 22 U/L (ref 0–44)
AST: 22 U/L (ref 15–41)
Albumin: 4.1 g/dL (ref 3.5–5.0)
Alkaline Phosphatase: 62 U/L (ref 38–126)
Anion gap: 9 (ref 5–15)
BUN: 13 mg/dL (ref 8–23)
CO2: 26 mmol/L (ref 22–32)
Calcium: 9 mg/dL (ref 8.9–10.3)
Chloride: 97 mmol/L — ABNORMAL LOW (ref 98–111)
Creatinine, Ser: 0.8 mg/dL (ref 0.44–1.00)
GFR, Estimated: >60 mL/min (ref >60)
Glucose, Bld: 90 mg/dL (ref 70–99)
Potassium: 4.1 mmol/L (ref 3.5–5.1)
Sodium: 132 mmol/L — ABNORMAL LOW (ref 135–145)
Total Bilirubin: 0.6 mg/dL (ref 0.3–1.2)
Total Protein: 6.9 g/dL (ref 6.5–8.1)

## 2023-01-20 LAB — CBC WITH DIFFERENTIAL/PLATELET
Abs Immature Granulocytes: 0.02 10*3/uL (ref 0.00–0.07)
Basophils Absolute: 0.1 10*3/uL (ref 0.0–0.1)
Basophils Relative: 1 %
Eosinophils Absolute: 0.3 10*3/uL (ref 0.0–0.5)
Eosinophils Relative: 4 %
HCT: 35.5 % — ABNORMAL LOW (ref 36.0–46.0)
Hemoglobin: 11.6 g/dL — ABNORMAL LOW (ref 12.0–15.0)
Immature Granulocytes: 0 %
Lymphocytes Relative: 25 %
Lymphs Abs: 1.6 10*3/uL (ref 0.7–4.0)
MCH: 28.9 pg (ref 26.0–34.0)
MCHC: 32.7 g/dL (ref 30.0–36.0)
MCV: 88.5 fL (ref 80.0–100.0)
Monocytes Absolute: 0.6 10*3/uL (ref 0.1–1.0)
Monocytes Relative: 10 %
Neutro Abs: 3.7 10*3/uL (ref 1.7–7.7)
Neutrophils Relative %: 60 %
Platelets: 284 10*3/uL (ref 150–400)
RBC: 4.01 MIL/uL (ref 3.87–5.11)
RDW: 19.4 % — ABNORMAL HIGH (ref 11.5–15.5)
WBC: 6.2 10*3/uL (ref 4.0–10.5)
nRBC: 0 % (ref 0.0–0.2)

## 2023-01-20 LAB — MAGNESIUM: Magnesium: 1.9 mg/dL (ref 1.7–2.4)

## 2023-01-20 MED ORDER — ZOLEDRONIC ACID 4 MG/100ML IV SOLN
4.0000 mg | Freq: Once | INTRAVENOUS | Status: AC
  Administered 2023-01-20: 4 mg via INTRAVENOUS
  Filled 2023-01-20: qty 100

## 2023-01-20 MED ORDER — SODIUM CHLORIDE 0.9 % IV SOLN: Freq: Once | INTRAVENOUS | Status: AC

## 2023-01-20 NOTE — Patient Instructions (Signed)
[facility]  Discharge Instructions: Thank you for choosing [facility] to provide your oncology and hematology care.  If you have a lab appointment with the Cancer Center - please note that after April 8th, 2024, all labs will be drawn in the cancer center.  You do not have to check in or register with the main entrance as you have in the past but will complete your check-in in the cancer center.  Wear comfortable clothing and clothing appropriate for easy access to any Portacath or PICC line.   We strive to give you quality time with your provider. You may need to reschedule your appointment if you arrive late (15 or more minutes).  Arriving late affects you and other patients whose appointments are after yours.  Also, if you miss three or more appointments without notifying the office, you may be dismissed from the clinic at the provider's discretion.      For prescription refill requests, have your pharmacy contact our office and allow 72 hours for refills to be completed.    Today you received the following chemotherapy and/or immunotherapy agents Zometa      To help prevent nausea and vomiting after your treatment, we encourage you to take your nausea medication as directed.  BELOW ARE SYMPTOMS THAT SHOULD BE REPORTED IMMEDIATELY: *FEVER GREATER THAN 100.4 F (38 C) OR HIGHER *CHILLS OR SWEATING *NAUSEA AND VOMITING THAT IS NOT CONTROLLED WITH YOUR NAUSEA MEDICATION *UNUSUAL SHORTNESS OF BREATH *UNUSUAL BRUISING OR BLEEDING *URINARY PROBLEMS (pain or burning when urinating, or frequent urination) *BOWEL PROBLEMS (unusual diarrhea, constipation, pain near the anus) TENDERNESS IN MOUTH AND THROAT WITH OR WITHOUT PRESENCE OF ULCERS (sore throat, sores in mouth, or a toothache) UNUSUAL RASH, SWELLING OR PAIN  UNUSUAL VAGINAL DISCHARGE OR ITCHING   Items with * indicate a potential emergency and should be followed up as soon as possible or go to the  Emergency Department if any problems should occur.  Please show the CHEMOTHERAPY ALERT CARD or IMMUNOTHERAPY ALERT CARD at check-in to the Emergency Department and triage nurse.  Should you have questions after your visit or need to cancel or reschedule your appointment, please contact [facility] [phone]  and follow the prompts.  Office hours are 8:00 a.m. to 4:30 p.m. Monday - Friday. Please note that voicemails left after 4:00 p.m. may not be returned until the following business day.  We are closed weekends and major holidays. You have access to a nurse at all times for urgent questions. Please call the main number to the clinic [phone] and follow the prompts.  For any non-urgent questions, you may also contact your provider using MyChart. We now offer e-Visits for anyone 18 and older to request care online for non-urgent symptoms. For details visit [email or web address].   Also download the MyChart app! Go to the app store, search "MyChart", open the app, select [facility], and log in with your MyChart username and password.   

## 2023-01-20 NOTE — Progress Notes (Signed)
Patient in clinic today for infusion. Patient stated she had sent in a stool sample on June the 14th for C-diff and a GI panel. Nurse noted that lab results needed to be recollected. Informed patient instructions and gave her supplies to collect specimen, she is aware of what to do.

## 2023-01-20 NOTE — Progress Notes (Signed)
Patient presents today for Zometa per providers order.  Vital signs WNL.  Patient has has no jaw pain, prior or upcoming dental work and calcium level within parameters.  No new complaints at this time.    Peripheral Iv started and blood return noted pre and post infusion.  Stable during infusion without adverse affects.  Vital signs stable.  No complaints at this time.  Discharge from clinic ambulatory in stable condition.  Alert and oriented X 3.  Follow up with [facility] as scheduled.

## 2023-01-27 ENCOUNTER — Encounter (HOSPITAL_COMMUNITY): Payer: Medicare Other

## 2023-01-29 ENCOUNTER — Other Ambulatory Visit (HOSPITAL_COMMUNITY): Payer: Self-pay | Admitting: Hematology

## 2023-01-29 ENCOUNTER — Other Ambulatory Visit: Payer: Self-pay | Admitting: *Deleted

## 2023-01-29 ENCOUNTER — Telehealth: Payer: Self-pay

## 2023-01-29 DIAGNOSIS — R197 Diarrhea, unspecified: Secondary | ICD-10-CM

## 2023-01-29 DIAGNOSIS — C9000 Multiple myeloma not having achieved remission: Secondary | ICD-10-CM | POA: Diagnosis not present

## 2023-01-29 LAB — C DIFFICILE QUICK SCREEN W PCR REFLEX
C Diff antigen: POSITIVE — AB
C Diff interpretation: DETECTED
C Diff toxin: POSITIVE — AB

## 2023-01-29 MED ORDER — FIDAXOMICIN 200 MG PO TABS: 200.0000 mg | ORAL_TABLET | Freq: Two times a day (BID) | ORAL | 0 refills | Status: DC

## 2023-01-29 NOTE — Progress Notes (Unsigned)
CRITICAL VALUE ALERT Critical value received:  C-diff postive  Date of notification:  01-29-2023. Time of notification: 12:54 pm. Critical value read back:  Yes.   Nurse who received alert:  B.Josepha Barbier RN.  MD notified time and response:  Dr. Ellin Saba @ 13:08 by D. Wilson Charity fundraiser.

## 2023-01-29 NOTE — Progress Notes (Signed)
Per Dr. Ellin Saba, fidaxomicin 200 mg twice daily for 10 days. Upon ordering there was a warning that she has reaction to clarithromycin with itching. Fidaxomicin is not absorbed through the stomach. She might not have the allergic reaction. But if she does have itching, she may take Benadryl.  Spoke to patient's husband and relayed above information.  Instructed them to use extra hygiene precautions as well.  Verbalized understanding.

## 2023-01-31 LAB — GI PATHOGEN PANEL BY PCR, STOOL

## 2023-02-11 ENCOUNTER — Other Ambulatory Visit: Payer: Self-pay

## 2023-02-11 MED ORDER — LENALIDOMIDE 10 MG PO CAPS: 10.0000 mg | ORAL_CAPSULE | Freq: Every day | ORAL | 0 refills | Status: DC

## 2023-02-11 NOTE — Telephone Encounter (Signed)
Chart reviewed. Revlimid refilled per last office note with Dr. Katragadda.  

## 2023-02-12 ENCOUNTER — Other Ambulatory Visit: Payer: Self-pay

## 2023-02-16 NOTE — Progress Notes (Signed)
[facility] [address], [place] [address]    Clinic Day:  02/16/2023  Referring physician: Maximiano Coss,*  Patient Care Team: Maximiano Coss, MD as PCP - General (Family Medicine) Doreatha Massed, MD as Medical Oncologist (Medical Oncology)   ASSESSMENT & PLAN:   Assessment: 1. 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 Jacklynn Ganong - She reported discomfort in the left posterior back for the last 1 month.  She was seen by Dr. Leary Roca and x-rays showed abnormality.  A CT scan of the chest was done on 05/24/2021. - CT chest report reviewed by me from 05/24/2021 showed expansile oval-shaped lesion within the posterior aspect of the seventh rib with a nodule measuring 2.1 x 1.1 cm.  Adjacent cortex demonstrates area of thinning and destruction.  Stable emphysematous and interstitial changes within the lungs.  Stable pulmonary nodules within the right upper lobe and left upper lobe when compared to CT from September 2020. - CT of the abdomen and pelvis did not show any significant abnormalities. - She denies any fevers, night sweats or weight loss in the last 6 months.  She had history of basal cell carcinoma on the nose removed by Mohs surgery. - She also reported history of osteoporosis and broke left upper rib 1 year ago after sneezing. -Reviewed PET scan from 06/14/2021.  Mild hypermetabolism involving left aspect of the L5 vertebral body and also pedicle region surrounding the screw.  SUV 4.2.  There appears to be a lytic process on the CT scan.  The lytic left posterior seventh rib lesion is mildly hypermetabolic with SUV 2.98.  No other definite lytic hypermetabolic bone lesions seen. - We have reviewed left seventh rib bone biopsy from 06/25/2021 consistent with plasma cell neoplasm.  Cells are positive for CD138, CD56 and a kappa restricted.  - Labs on 06/13/2021 M spike 1.7 g.  Immunofixation  IgA kappa.  Free light chain shows kappa light chain 71, ratio 8.22.  LDH and beta-2 microglobulin was normal. - Bone marrow biopsy on 07/04/2021 consistent with plasma cell neoplasm with 26% plasma cells on aspirate with atypical features and 50 to 60% on core biopsy.  Chromosome analysis was normal.  Multiple myeloma FISH panel shows loss of NF1/chromosome 17/17 q.  Gain of CCN D1 or chromosome 11/11 q.  Gain of chromosome 4/4P.  Loss of material from the long-arm of chromosome 13.  These are all standard risk features. - 24-hour urine total protein to 40 mg.  Urine immunofixation was negative.  Urine kappa light chains were 4.13. - 8 cycles of Dara RVD from 07/26/2021 through 03/01/2022 - T11 and T12 vertebroplasty by Dr. Jake Samples on 08/31/2021. - She has decided against bone marrow transplant after the advice of Dr. Valentina Lucks. - Maintenance daratumumab discontinued on 08/29/2022.  Maintenance Revlimid 10 mg 3 weeks on/1 week off ongoing.   2. Social/family history: - She is married and lives at home with her husband.  She has not worked but had help her husband and his work.  She is a non-smoker. - Maternal grandmother had colon cancer.  Mother had a squamous cell carcinoma of the skin.    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 - She had intermittent diarrhea lasting about 7 to 10 days.  When she has diarrhea, she has up to 5 watery bowel movements per day. -  She lost about 5 pounds in the last 2 months. - Reviewed myeloma labs from 10/28/2022: M spike is not observed.  Free light chain ratio is 1.67. - Labs today: Normal LFTs and electrolytes.  CBC grossly normal. - Recommend checking stool for GI pathogen panel and C. difficile.  If they are negative, she may use Imodium as needed for watery stools. - Continue Revlimid 10 mg 3 weeks on/1 week off.  RTC 2 months for follow-up.  2.  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 - Dr. Valentina Lucks at [facility] did not recommend any further antibiotics.  3.  Thromboprophylaxis: - Continue aspirin 81 mg daily.  4.  Myeloma bone disease: - Calcium is 8.9 today.  Proceed with Zometa today and in 4 weeks.    No orders of the defined types were placed in this encounter.     Alben Deeds Teague,acting as a Neurosurgeon for Doreatha Massed, MD.,have documented all relevant documentation on the behalf of Doreatha Massed, MD,as directed by  Doreatha Massed, MD while in the presence of Doreatha Massed, MD.  ***   [place]   8/4/20249:53 PM  CHIEF COMPLAINT:   Diagnosis: multiple myeloma    Cancer Staging  Multiple myeloma [facility]) Staging form: Multiple Myeloma, AJCC 6th Edition - Clinical stage from 07/17/2021: Stage IA - Unsigned    Prior Therapy: DaraVRd (Daratumumab SQ) q21d x 6 Cycles (Induction/Consolidation)   Current Therapy:  Revlimid maintenance    HISTORY OF PRESENT ILLNESS:   Oncology History  Multiple myeloma (HCC)  07/17/2021 Initial Diagnosis   Multiple myeloma (HCC)   07/26/2021 - 03/15/2022 Chemotherapy   Patient is on Treatment Plan : MYELOMA NEWLY DIAGNOSED TRANSPLANT CANDIDATE DaraVRd (Daratumumab SQ) q21d x 6 Cycles (Induction/Consolidation)     04/17/2022 -  Chemotherapy   Patient is on Treatment Plan : MYELOMA NEWLY DIAGNOSED TRANSPLANT CANDIDATE Daratumumab SQ + Lenalidomide q28d (Maintenance)        INTERVAL HISTORY:   Amanda Conner is a 72 y.o. female presenting to clinic today for follow up of multiple myeloma. She was last seen by me on 12/23/22.  Today, she states that she is doing well overall. Her appetite level is at ***%. Her energy level is at ***%.  PAST MEDICAL HISTORY:   Past Medical History: Past Medical History:  Diagnosis Date   Arthritis    osteoarthritis   Back pain    Bronchiectasis (HCC)    Cancer (HCC)     basal cell nose   Chronic cough    Complication of anesthesia    Dyspnea    Gastrointestinal symptoms    GERD (gastroesophageal reflux disease)    Glaucoma    History of hiatal hernia    Hyperlipemia    Hypertension    Multiple myeloma (HCC)    Osteopenia    Pneumonia    PONV (postoperative nausea and vomiting)    "only one time"   Spondylisthesis     Surgical History: Past Surgical History:  Procedure Laterality Date   ABDOMINAL EXPOSURE N/A 12/29/2018   Procedure: ABDOMINAL EXPOSURE;  Surgeon: Chuck Hint, MD;  Location: [facility] OR;  Service: Vascular;  Laterality: N/A;   ABDOMINOPLASTY  2002   ABDOMINOPLASTY  1970's   ANKLE SURGERY Left 10/2015   ANTERIOR LATERAL LUMBAR FUSION WITH PERCUTANEOUS SCREW 3 LEVEL Left 12/29/2018   Procedure: Lumbar Two to Lumbar Five Anterolateral lumbar interbody fusion;  Surgeon: Maeola Harman, MD;  Location: [facility] OR;  Service: Neurosurgery;  Laterality: Left;  anterolateral   ANTERIOR LUMBAR FUSION N/A 12/29/2018   Procedure: Lumbar Five Sacral One Anterior lumbar interbody fusion;  Surgeon: Maeola Harman, MD;  Location: [facility] OR;  Service: Neurosurgery;  Laterality: N/A;  anterior approach   BASAL CELL CARCINOMA EXCISION  06/2018   nose   BLEPHAROPLASTY Bilateral 1999   BREAST ENHANCEMENT SURGERY  1970's   COLONOSCOPY     CYSTOURETHROSCOPY  2018   Doble J ureteal stents   DECOMPRESSION CORE HIP Left 2014   FACIAL COSMETIC SURGERY  2004   FLUOROSCOPY GUIDANCE     HIP ARTHROPLASTY Left    INCISIONAL HERNIA REPAIR N/A 10/08/2019   Procedure: OPEN INCISIONAL HERNIA REPAIR WITH MESH;  Surgeon: Darnell Level, MD;  Location: [facility] ORS;  Service: General;  Laterality: N/A;   JOINT REPLACEMENT Left    Hip; 05/2014   KYPHOPLASTY N/A 08/31/2021   Procedure: KYPHOPLASTY THORACIC ELEVEN, THORACIC TWELVE;  Surgeon: Bethann Goo, DO;  Location: [facility] OR;  Service: Neurosurgery;  Laterality: N/A;   LAPAROSCOPIC PARTIAL COLECTOMY  06/26/2017   LUMBAR  PERCUTANEOUS PEDICLE SCREW 4 LEVEL N/A 12/29/2018   Procedure: Lumbar Percutaneous Pedicle Screw Placement Lumbar two-Sacral one;  Surgeon: Maeola Harman, MD;  Location: [facility] OR;  Service: Neurosurgery;  Laterality: N/A;   MOHS SURGERY  2019   nose   PARTIAL COLECTOMY  06/2017   for diverticulitis   REMOVAL OF BILATERAL TISSUE EXPANDERS WITH PLACEMENT OF BILATERAL BREAST IMPLANTS  2004   THIGH LIFT  1994   TOE FUSION Right    great toe   TONSILLECTOMY     UMBILICAL HERNIA REPAIR     with tummy tuck   UMBILICAL HERNIA REPAIR  2002    Social History: Social History   Socioeconomic History   Marital status: Married    Spouse name: Not on file   Number of children: Not on file   Years of education: Not on file   Highest education level: Not on file  Occupational History   Not on file  Tobacco Use   Smoking status: Never   Smokeless tobacco: Never  Vaping Use   Vaping status: Never Used  Substance and Sexual Activity   Alcohol use: Yes    Comment: occasional   Drug use: Never   Sexual activity: Not on file  Other Topics Concern   Not on file  Social History Narrative   Not on file   Social Determinants of Health   Financial Resource Strain: Low Risk  (07/24/2021)   Overall Financial Resource Strain (CARDIA)    Difficulty of Paying Living Expenses: Not hard at all  Food Insecurity: No Food Insecurity (03/04/2022)   Received from [facility]   Hunger Vital Sign  Transportation Needs: No Transportation Needs (07/24/2021)   PRAPARE - Administrator, Civil Service (Medical): No    Lack of Transportation (Non-Medical): No  Physical Activity: Not on file  Stress: Not on file  Social Connections: Socially Integrated (07/24/2021)   Social Connection and Isolation Panel [NHANES]    Frequency of Communication with Friends and Family: More than three times a week    Frequency of Social Gatherings with Friends and Family: More than three times a week     Attends Religious Services: 1 to 4 times per year    Active Member of [employer] or Organizations: No    Attends Banker Meetings: 1 to 4 times per year  Marital Status: Married  Catering manager Violence: Not on file    Family History: Family History  Problem Relation Age of Onset   Hypertension Mother    COPD Mother    Hypertension Father    Heart disease Father     Current Medications:  Current Outpatient Medications:    albuterol (VENTOLIN HFA) 108 (90 Base) MCG/ACT inhaler, Inhale 2 puffs into the lungs every 6 (six) hours as needed for wheezing or shortness of breath., Disp: , Rfl:    ANORO ELLIPTA 62.5-25 MCG/ACT AEPB, Inhale 1 puff into the lungs daily., Disp: , Rfl:    aspirin EC 81 MG tablet, Take 81 mg by mouth daily. Swallow whole., Disp: , Rfl:    Baclofen 5 MG TABS, Take 1 tablet by mouth 3 (three) times daily as needed., Disp: , Rfl:    benzonatate (TESSALON) 100 MG capsule, Take 100 mg by mouth 3 (three) times daily as needed for cough., Disp: , Rfl:    Biotin 1000 MCG tablet, Take 1,000 mcg by mouth daily., Disp: , Rfl:    calcitonin, salmon, (MIACALCIN/FORTICAL) 200 UNIT/ACT nasal spray, Place 1 spray into alternate nostrils daily., Disp: , Rfl:    Calcium Carb-Cholecalciferol 600-800 MG-UNIT TABS, Take 1 tablet by mouth daily., Disp: , Rfl:    daratumumab-hyaluronidase-fihj (DARZALEX FASPRO) 1800-30000 MG-UT/15ML SOLN, Inject 1,800 mg into the skin once a week., Disp: , Rfl:    dexamethasone (DECADRON) 2 MG tablet, Take 5 tablets (10 mg total) by mouth every 30 (thirty) days., Disp: 20 tablet, Rfl: 4   dextromethorphan-guaiFENesin (MUCINEX DM) 30-600 MG 12hr tablet, Take 1 tablet by mouth daily. , Disp: , Rfl:    estrogen, conjugated,-medroxyprogesterone (PREMPRO) 0.45-1.5 MG tablet, Take 1 tablet by mouth daily., Disp: , Rfl:    feeding supplement (ENSURE ENLIVE / ENSURE PLUS) LIQD, Take 237 mLs by mouth 2 (two) times daily between meals. (Patient taking  differently: Take 237 mLs by mouth daily.), Disp: 237 mL, Rfl: 12   fidaxomicin (DIFICID) 200 MG TABS tablet, Take 1 tablet (200 mg total) by mouth 2 (two) times daily., Disp: 20 tablet, Rfl: 0   fluticasone (FLONASE) 50 MCG/ACT nasal spray, Place 1 spray into both nostrils daily., Disp: , Rfl:    furosemide (LASIX) 20 MG tablet, TAKE 1 TABLET BY MOUTH EVERY DAY AS NEEDED, Disp: 90 tablet, Rfl: 3   hydrochlorothiazide (HYDRODIURIL) 25 MG tablet, Take 25 mg by mouth daily as needed (swelling)., Disp: , Rfl:    HYDROcodone bit-homatropine (HYCODAN) 5-1.5 MG/5ML syrup, Take 5 mLs by mouth every 6 (six) hours as needed for cough., Disp: 473 mL, Rfl: 0   Lactulose 20 GM/30ML SOLN, Take 30 ml by mouth every 3 hours until bowel movement is had, then take 30 ml daily (Patient taking differently: Take 20 g by mouth daily as needed (constipation).), Disp: 946 mL, Rfl: 3   latanoprost (XALATAN) 0.005 % ophthalmic solution, Place 1 drop into both eyes at bedtime. , Disp: , Rfl:    lenalidomide (REVLIMID) 10 MG capsule, Take 1 capsule (10 mg total) by mouth daily. 21 days on, 7 days off, Disp: 21 capsule, Rfl: 0   linaclotide (LINZESS) 145 MCG CAPS capsule, Take 145 mcg by mouth daily as needed (constipation)., Disp: , Rfl:    lisinopril (ZESTRIL) 20 MG tablet, Take 20 mg by mouth daily., Disp: , Rfl:    loratadine (CLARITIN) 10 MG tablet, Take 10 mg by mouth daily., Disp: , Rfl:    loteprednol (LOTEMAX) 0.5 %  ophthalmic suspension, Place into the left eye., Disp: , Rfl:    methocarbamol (ROBAXIN) 500 MG tablet, Take 1 tablet (500 mg total) by mouth every 6 (six) hours as needed for muscle spasms., Disp: 40 tablet, Rfl: 3   omeprazole (PRILOSEC) 20 MG capsule, Take 20 mg by mouth daily., Disp: , Rfl:    ondansetron (ZOFRAN ODT) 4 MG disintegrating tablet, Take 1 tablet (4 mg total) by mouth every 4 (four) hours as needed for nausea or vomiting., Disp: 60 tablet, Rfl: 3   Oxycodone HCl 10 MG TABS, Take 1 tablet  (10 mg total) by mouth every 6 (six) hours as needed., Disp: 120 tablet, Rfl: 0   pantoprazole (PROTONIX) 40 MG tablet, Take 40 mg by mouth daily., Disp: , Rfl:    predniSONE (DELTASONE) 10 MG tablet, TAKE 6 TABLETS ON DAY 1 AS DIRECTED ON PACKAGE AND DECREASE BY 1 TAB EACH DAY FOR A TOTAL OF 6 DAYS, Disp: , Rfl:    sucralfate (CARAFATE) 1 g tablet, Take 1 g by mouth 4 (four) times daily., Disp: , Rfl:    SUMAtriptan (IMITREX) 100 MG tablet, Take 100 mg by mouth every 2 (two) hours as needed for migraine. May repeat in 2 hours if headache persists or recurs., Disp: , Rfl:    tiZANidine (ZANAFLEX) 2 MG tablet, Take 2 mg by mouth every 6 (six) hours as needed., Disp: , Rfl:    UNABLE TO FIND, Place 0.5 g into both eyes 3 (three) times daily. Med Name: Loteman (otepredol), Disp: , Rfl:    Allergies: Allergies  Allergen Reactions   Tobramycin-Dexamethasone Other (See Comments)    Itching and swelling   Ethambutol Other (See Comments)    Fever   Neomycin-Polymyxin-Dexameth Itching and Swelling   Amikacin Other (See Comments)    Eosinophilia with chills and aching when given with cefoxitin   Biaxin [Clarithromycin] Itching   Cefoxitin Other (See Comments)    Eosinophlia when given with amikacin   Sulfamethoxazole-Trimethoprim Itching   Vancomycin     "Got red splotches on skin after several doses"   Bacitracin-Polymyxin B Rash    REVIEW OF SYSTEMS:   Review of Systems  Constitutional:  Negative for chills, fatigue and fever.  HENT:   Negative for lump/mass, mouth sores, nosebleeds, sore throat and trouble swallowing.   Eyes:  Negative for eye problems.  Respiratory:  Negative for cough and shortness of breath.   Cardiovascular:  Negative for chest pain, leg swelling and palpitations.  Gastrointestinal:  Negative for abdominal pain, constipation, diarrhea, nausea and vomiting.  Genitourinary:  Negative for bladder incontinence, difficulty urinating, dysuria, frequency, hematuria and  nocturia.   Musculoskeletal:  Negative for arthralgias, back pain, flank pain, myalgias and neck pain.  Skin:  Negative for itching and rash.  Neurological:  Negative for dizziness, headaches and numbness.  Hematological:  Does not bruise/bleed easily.  Psychiatric/Behavioral:  Negative for depression, sleep disturbance and suicidal ideas. The patient is not nervous/anxious.   All other systems reviewed and are negative.    VITALS:   There were no vitals taken for this visit.  Wt Readings from Last 3 Encounters:  01/20/23 106 lb 6.4 oz (48.3 kg)  12/23/22 107 lb (48.5 kg)  10/28/22 112 lb 11.2 oz (51.1 kg)    There is no height or weight on file to calculate BMI.  Performance status (ECOG): 1 - Symptomatic but completely ambulatory  PHYSICAL EXAM:   Physical Exam Vitals and nursing note reviewed. Exam conducted with  a chaperone present.  Constitutional:      Appearance: Normal appearance.  Cardiovascular:     Rate and Rhythm: Normal rate and regular rhythm.     Pulses: Normal pulses.     Heart sounds: Normal heart sounds.  Pulmonary:     Effort: Pulmonary effort is normal.     Breath sounds: Normal breath sounds.  Abdominal:     Palpations: Abdomen is soft. There is no hepatomegaly, splenomegaly or mass.     Tenderness: There is no abdominal tenderness.  Musculoskeletal:     Right lower leg: No edema.     Left lower leg: No edema.  Lymphadenopathy:     Cervical: No cervical adenopathy.     Right cervical: No superficial, deep or posterior cervical adenopathy.    Left cervical: No superficial, deep or posterior cervical adenopathy.     Upper Body:     Right upper body: No supraclavicular or axillary adenopathy.     Left upper body: No supraclavicular or axillary adenopathy.  Neurological:     General: No focal deficit present.     Mental Status: She is alert and oriented to person, place, and time.  Psychiatric:        Mood and Affect: Mood normal.        Behavior:  Behavior normal.     LABS:      Latest Ref Rng & Units 01/20/2023   12:30 PM 12/23/2022   10:58 AM 11/25/2022    8:06 AM  CBC  WBC 4.0 - 10.5 K/uL 6.2  6.0  6.3   Hemoglobin 12.0 - 15.0 g/dL 16.1  09.6  04.5   Hematocrit 36.0 - 46.0 % 35.5  36.6  37.7   Platelets 150 - 400 K/uL 284  240  264       Latest Ref Rng & Units 01/20/2023   12:30 PM 12/23/2022   10:58 AM 11/25/2022    8:06 AM  CMP  Glucose 70 - 99 mg/dL 90  98  409   BUN 8 - 23 mg/dL 13  20  19    Creatinine 0.44 - 1.00 mg/dL 8.11  9.14  7.82   Sodium 135 - 145 mmol/L 132  132  136   Potassium 3.5 - 5.1 mmol/L 4.1  4.4  3.5   Chloride 98 - 111 mmol/L 97  98  103   CO2 22 - 32 mmol/L 26  26  25    Calcium 8.9 - 10.3 mg/dL 9.0  8.9  9.0   Total Protein 6.5 - 8.1 g/dL 6.9  6.9  6.6   Total Bilirubin 0.3 - 1.2 mg/dL 0.6  0.5  0.7   Alkaline Phos 38 - 126 U/L 62  67  61   AST 15 - 41 U/L 22  24  20    ALT 0 - 44 U/L 22  26  19       No results found for: "CEA1", "CEA" / No results found for: "CEA1", "CEA" No results found for: "PSA1" No results found for: "CAN199" No results found for: "CAN125"  Lab Results  Component Value Date   TOTALPROTELP 6.5 12/23/2022   ALBUMINELP 3.9 12/23/2022   A1GS 0.3 12/23/2022   A2GS 0.7 12/23/2022   BETS 1.0 12/23/2022   GAMS 0.6 12/23/2022   MSPIKE Not Observed 12/23/2022   SPEI Comment 12/23/2022   No results found for: "TIBC", "FERRITIN", "IRONPCTSAT" Lab Results  Component Value Date   LDH 162 04/17/2022   LDH 140 03/15/2022  LDH 143 02/14/2022     STUDIES:   No results found.

## 2023-02-17 ENCOUNTER — Inpatient Hospital Stay: Payer: Medicare Other | Attending: Hematology | Admitting: Hematology

## 2023-02-17 ENCOUNTER — Inpatient Hospital Stay: Payer: Medicare Other

## 2023-02-17 VITALS — BP 134/69 | HR 60 | Resp 16

## 2023-02-17 VITALS — BP 117/56 | HR 58 | Temp 97.3°F | Resp 16 | Wt 105.7 lb

## 2023-02-17 DIAGNOSIS — C9000 Multiple myeloma not having achieved remission: Secondary | ICD-10-CM | POA: Insufficient documentation

## 2023-02-17 LAB — CBC WITH DIFFERENTIAL/PLATELET
Abs Immature Granulocytes: 0.02 10*3/uL (ref 0.00–0.07)
Basophils Absolute: 0.1 10*3/uL (ref 0.0–0.1)
Basophils Relative: 1 %
Eosinophils Absolute: 0.4 10*3/uL (ref 0.0–0.5)
Eosinophils Relative: 6 %
HCT: 36.9 % (ref 36.0–46.0)
Hemoglobin: 11.8 g/dL — ABNORMAL LOW (ref 12.0–15.0)
Immature Granulocytes: 0 %
Lymphocytes Relative: 29 %
Lymphs Abs: 1.9 10*3/uL (ref 0.7–4.0)
MCH: 29.4 pg (ref 26.0–34.0)
MCHC: 32 g/dL (ref 30.0–36.0)
MCV: 91.8 fL (ref 80.0–100.0)
Monocytes Absolute: 0.7 10*3/uL (ref 0.1–1.0)
Monocytes Relative: 11 %
Neutro Abs: 3.6 10*3/uL (ref 1.7–7.7)
Neutrophils Relative %: 53 %
Platelets: 285 10*3/uL (ref 150–400)
RBC: 4.02 MIL/uL (ref 3.87–5.11)
RDW: 18.6 % — ABNORMAL HIGH (ref 11.5–15.5)
WBC: 6.8 10*3/uL (ref 4.0–10.5)
nRBC: 0 % (ref 0.0–0.2)

## 2023-02-17 LAB — COMPREHENSIVE METABOLIC PANEL
ALT: 34 U/L (ref 0–44)
AST: 24 U/L (ref 15–41)
Albumin: 4.5 g/dL (ref 3.5–5.0)
Alkaline Phosphatase: 76 U/L (ref 38–126)
Anion gap: 8 (ref 5–15)
BUN: 13 mg/dL (ref 8–23)
CO2: 28 mmol/L (ref 22–32)
Calcium: 9.4 mg/dL (ref 8.9–10.3)
Chloride: 97 mmol/L — ABNORMAL LOW (ref 98–111)
Creatinine, Ser: 0.88 mg/dL (ref 0.44–1.00)
GFR, Estimated: >60 mL/min (ref >60)
Glucose, Bld: 91 mg/dL (ref 70–99)
Potassium: 4.2 mmol/L (ref 3.5–5.1)
Sodium: 133 mmol/L — ABNORMAL LOW (ref 135–145)
Total Bilirubin: 0.6 mg/dL (ref 0.3–1.2)
Total Protein: 7.3 g/dL (ref 6.5–8.1)

## 2023-02-17 LAB — MAGNESIUM: Magnesium: 1.8 mg/dL (ref 1.7–2.4)

## 2023-02-17 MED ORDER — SODIUM CHLORIDE 0.9 % IV SOLN: Freq: Once | INTRAVENOUS | Status: AC

## 2023-02-17 MED ORDER — ZOLEDRONIC ACID 4 MG/100ML IV SOLN
4.0000 mg | Freq: Once | INTRAVENOUS | Status: AC
  Administered 2023-02-17: 4 mg via INTRAVENOUS
  Filled 2023-02-17: qty 100

## 2023-02-17 NOTE — Patient Instructions (Signed)
[facility]  Discharge Instructions: Thank you for choosing [facility] to provide your oncology and hematology care.  If you have a lab appointment with the Cancer Center - please note that after April 8th, 2024, all labs will be drawn in the cancer center.  You do not have to check in or register with the main entrance as you have in the past but will complete your check-in in the cancer center.  Wear comfortable clothing and clothing appropriate for easy access to any Portacath or PICC line.   We strive to give you quality time with your provider. You may need to reschedule your appointment if you arrive late (15 or more minutes).  Arriving late affects you and other patients whose appointments are after yours.  Also, if you miss three or more appointments without notifying the office, you may be dismissed from the clinic at the provider's discretion.      For prescription refill requests, have your pharmacy contact our office and allow 72 hours for refills to be completed.    Today you received the following chemotherapy and/or immunotherapy agents Zometa      To help prevent nausea and vomiting after your treatment, we encourage you to take your nausea medication as directed.  BELOW ARE SYMPTOMS THAT SHOULD BE REPORTED IMMEDIATELY: *FEVER GREATER THAN 100.4 F (38 C) OR HIGHER *CHILLS OR SWEATING *NAUSEA AND VOMITING THAT IS NOT CONTROLLED WITH YOUR NAUSEA MEDICATION *UNUSUAL SHORTNESS OF BREATH *UNUSUAL BRUISING OR BLEEDING *URINARY PROBLEMS (pain or burning when urinating, or frequent urination) *BOWEL PROBLEMS (unusual diarrhea, constipation, pain near the anus) TENDERNESS IN MOUTH AND THROAT WITH OR WITHOUT PRESENCE OF ULCERS (sore throat, sores in mouth, or a toothache) UNUSUAL RASH, SWELLING OR PAIN  UNUSUAL VAGINAL DISCHARGE OR ITCHING   Items with * indicate a potential emergency and should be followed up as soon as possible or go to the  Emergency Department if any problems should occur.  Please show the CHEMOTHERAPY ALERT CARD or IMMUNOTHERAPY ALERT CARD at check-in to the Emergency Department and triage nurse.  Should you have questions after your visit or need to cancel or reschedule your appointment, please contact [facility] [phone]  and follow the prompts.  Office hours are 8:00 a.m. to 4:30 p.m. Monday - Friday. Please note that voicemails left after 4:00 p.m. may not be returned until the following business day.  We are closed weekends and major holidays. You have access to a nurse at all times for urgent questions. Please call the main number to the clinic [phone] and follow the prompts.  For any non-urgent questions, you may also contact your provider using MyChart. We now offer e-Visits for anyone 18 and older to request care online for non-urgent symptoms. For details visit [email or web address].   Also download the MyChart app! Go to the app store, search "MyChart", open the app, select [facility], and log in with your MyChart username and password.   

## 2023-02-17 NOTE — Progress Notes (Signed)
Patient presents today for Zometa infusion per providers order.  Vital signs and labs reviewed by MD.  Message received from Kennith Gain RN/Dr. Ellin Saba patient okay for treatment.  Stable during infusion without adverse affects.  Vital signs stable.  No complaints at this time.  Discharge from clinic ambulatory in stable condition.  Alert and oriented X 3.  Follow up with [facility] as scheduled.

## 2023-02-17 NOTE — Patient Instructions (Addendum)
[facility]  Discharge Instructions  You were seen and examined today by Dr. Ellin Saba.  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  Proceed with treatment as scheduled.  Follow-up as scheduled.  Thank you for choosing [facility] to provide your oncology and hematology care.   To afford each patient quality time with our provider, please arrive at least 15 minutes before your scheduled appointment time. You may need to reschedule your appointment if you arrive late (10 or more minutes). Arriving late affects you and other patients whose appointments are after yours.  Also, if you miss three or more appointments without notifying the office, you may be dismissed from the clinic at the provider's discretion.    Again, thank you for choosing [facility].  Our hope is that these requests will decrease the amount of time that you wait before being seen by our physicians.   If you have a lab appointment with the Cancer Center - please note that after April 8th, all labs will be drawn in the cancer center.  You do not have to check in or register with the main entrance as you have in the past but will complete your check-in at the cancer center.            _____________________________________________________________  Should you have questions after your visit to [facility], please contact our office at [phone] and follow the prompts.  Our office hours are 8:00 a.m. to 4:30 p.m. Monday - Thursday and 8:00 a.m. to 2:30 p.m. Friday.  Please note that voicemails left after 4:00 p.m. may not be returned until the following business day.  We are closed weekends and all major holidays.  You do have access to a nurse 24-7, just call the main number to the clinic [phone] and do not press any options, hold on the line and a nurse will answer the phone.    For prescription refill requests, have your pharmacy  contact our office and allow 72 hours.    Masks are no longer required in the cancer centers. If you would like for your care team to wear a mask while they are taking care of you, please let them know. You may have one support person who is at least 73 years old accompany you for your appointments.

## 2023-02-19 ENCOUNTER — Other Ambulatory Visit: Payer: Self-pay

## 2023-03-05 ENCOUNTER — Other Ambulatory Visit: Payer: Self-pay | Admitting: *Deleted

## 2023-03-05 ENCOUNTER — Telehealth: Payer: Self-pay | Admitting: *Deleted

## 2023-03-05 ENCOUNTER — Other Ambulatory Visit (HOSPITAL_COMMUNITY): Payer: Self-pay | Admitting: Hematology

## 2023-03-05 DIAGNOSIS — R197 Diarrhea, unspecified: Secondary | ICD-10-CM

## 2023-03-05 NOTE — Telephone Encounter (Signed)
Patient called to advise that she feels as if she may have c diff again.  Stomach cramping and loose watery stools.  Also c/o fever and chills.  She was advised to go to the ER due to the fact that she is on treatment and needs to be evaluated.  States that she may go tomorrow if it gets worse, despite recommendations.

## 2023-03-09 ENCOUNTER — Other Ambulatory Visit: Payer: Self-pay

## 2023-03-10 ENCOUNTER — Other Ambulatory Visit: Payer: Self-pay

## 2023-03-10 MED ORDER — LENALIDOMIDE 10 MG PO CAPS: 10.0000 mg | ORAL_CAPSULE | Freq: Every day | ORAL | 0 refills | Status: DC

## 2023-03-10 NOTE — Telephone Encounter (Signed)
Chart reviewed. Revlimid refilled per last office note with Dr. Katragadda.  

## 2023-03-18 ENCOUNTER — Inpatient Hospital Stay: Payer: Medicare Other | Attending: Hematology

## 2023-03-18 ENCOUNTER — Inpatient Hospital Stay: Payer: Medicare Other

## 2023-03-18 VITALS — BP 144/71 | HR 72 | Temp 97.5°F | Resp 18 | Wt 104.6 lb

## 2023-03-18 DIAGNOSIS — C9000 Multiple myeloma not having achieved remission: Secondary | ICD-10-CM | POA: Insufficient documentation

## 2023-03-18 LAB — CBC WITH DIFFERENTIAL/PLATELET
Abs Immature Granulocytes: 0.02 10*3/uL (ref 0.00–0.07)
Basophils Absolute: 0.1 10*3/uL (ref 0.0–0.1)
Basophils Relative: 1 %
Eosinophils Absolute: 0.3 10*3/uL (ref 0.0–0.5)
Eosinophils Relative: 5 %
HCT: 35.8 % — ABNORMAL LOW (ref 36.0–46.0)
Hemoglobin: 11.5 g/dL — ABNORMAL LOW (ref 12.0–15.0)
Immature Granulocytes: 0 %
Lymphocytes Relative: 27 %
Lymphs Abs: 1.8 10*3/uL (ref 0.7–4.0)
MCH: 29.4 pg (ref 26.0–34.0)
MCHC: 32.1 g/dL (ref 30.0–36.0)
MCV: 91.6 fL (ref 80.0–100.0)
Monocytes Absolute: 0.7 10*3/uL (ref 0.1–1.0)
Monocytes Relative: 11 %
Neutro Abs: 3.6 10*3/uL (ref 1.7–7.7)
Neutrophils Relative %: 56 %
Platelets: 272 10*3/uL (ref 150–400)
RBC: 3.91 MIL/uL (ref 3.87–5.11)
RDW: 17.5 % — ABNORMAL HIGH (ref 11.5–15.5)
WBC: 6.4 10*3/uL (ref 4.0–10.5)
nRBC: 0 % (ref 0.0–0.2)

## 2023-03-18 LAB — COMPREHENSIVE METABOLIC PANEL
ALT: 22 U/L (ref 0–44)
AST: 20 U/L (ref 15–41)
Albumin: 4.1 g/dL (ref 3.5–5.0)
Alkaline Phosphatase: 66 U/L (ref 38–126)
Anion gap: 9 (ref 5–15)
BUN: 16 mg/dL (ref 8–23)
CO2: 26 mmol/L (ref 22–32)
Calcium: 9.1 mg/dL (ref 8.9–10.3)
Chloride: 98 mmol/L (ref 98–111)
Creatinine, Ser: 0.84 mg/dL (ref 0.44–1.00)
GFR, Estimated: >60 mL/min (ref >60)
Glucose, Bld: 102 mg/dL — ABNORMAL HIGH (ref 70–99)
Potassium: 3.7 mmol/L (ref 3.5–5.1)
Sodium: 133 mmol/L — ABNORMAL LOW (ref 135–145)
Total Bilirubin: 0.6 mg/dL (ref 0.3–1.2)
Total Protein: 7 g/dL (ref 6.5–8.1)

## 2023-03-18 LAB — MAGNESIUM: Magnesium: 1.9 mg/dL (ref 1.7–2.4)

## 2023-03-18 MED ORDER — SODIUM CHLORIDE 0.9 % IV SOLN: Freq: Once | INTRAVENOUS | Status: AC

## 2023-03-18 MED ORDER — ZOLEDRONIC ACID 4 MG/100ML IV SOLN
4.0000 mg | Freq: Once | INTRAVENOUS | Status: AC
  Administered 2023-03-18: 4 mg via INTRAVENOUS
  Filled 2023-03-18: qty 100

## 2023-03-18 NOTE — Progress Notes (Signed)
Patient presents today for Zometa 4mg  IV per provider's order. Vitals signs stable and pt voiced no new complaints at this time.  Peripheral IV started with good blood return pre and post infusion.  Discharged from clinic ambulatory in stable condition. Alert and oriented x 3. F/U with [facility] as scheduled.

## 2023-03-18 NOTE — Patient Instructions (Signed)
[facility]  Discharge Instructions: Thank you for choosing [facility] to provide your oncology and hematology care.  If you have a lab appointment with the Cancer Center - please note that after April 8th, 2024, all labs will be drawn in the cancer center.  You do not have to check in or register with the main entrance as you have in the past but will complete your check-in in the cancer center.  Wear comfortable clothing and clothing appropriate for easy access to any Portacath or PICC line.   We strive to give you quality time with your provider. You may need to reschedule your appointment if you arrive late (15 or more minutes).  Arriving late affects you and other patients whose appointments are after yours.  Also, if you miss three or more appointments without notifying the office, you may be dismissed from the clinic at the provider's discretion.      For prescription refill requests, have your pharmacy contact our office and allow 72 hours for refills to be completed.    Today you received Zometa 4mg IV     BELOW ARE SYMPTOMS THAT SHOULD BE REPORTED IMMEDIATELY: *FEVER GREATER THAN 100.4 F (38 C) OR HIGHER *CHILLS OR SWEATING *NAUSEA AND VOMITING THAT IS NOT CONTROLLED WITH YOUR NAUSEA MEDICATION *UNUSUAL SHORTNESS OF BREATH *UNUSUAL BRUISING OR BLEEDING *URINARY PROBLEMS (pain or burning when urinating, or frequent urination) *BOWEL PROBLEMS (unusual diarrhea, constipation, pain near the anus) TENDERNESS IN MOUTH AND THROAT WITH OR WITHOUT PRESENCE OF ULCERS (sore throat, sores in mouth, or a toothache) UNUSUAL RASH, SWELLING OR PAIN  UNUSUAL VAGINAL DISCHARGE OR ITCHING   Items with * indicate a potential emergency and should be followed up as soon as possible or go to the Emergency Department if any problems should occur.  Please show the CHEMOTHERAPY ALERT CARD or IMMUNOTHERAPY ALERT CARD at check-in to the Emergency Department and triage  nurse.  Should you have questions after your visit or need to cancel or reschedule your appointment, please contact [facility] [phone]  and follow the prompts.  Office hours are 8:00 a.m. to 4:30 p.m. Monday - Friday. Please note that voicemails left after 4:00 p.m. may not be returned until the following business day.  We are closed weekends and major holidays. You have access to a nurse at all times for urgent questions. Please call the main number to the clinic [phone] and follow the prompts.  For any non-urgent questions, you may also contact your provider using MyChart. We now offer e-Visits for anyone 18 and older to request care online for non-urgent symptoms. For details visit [email or web address].   Also download the MyChart app! Go to the app store, search "MyChart", open the app, select [facility], and log in with your MyChart username and password.   

## 2023-03-31 ENCOUNTER — Other Ambulatory Visit (HOSPITAL_COMMUNITY): Payer: Self-pay | Admitting: Hematology

## 2023-03-31 ENCOUNTER — Telehealth: Payer: Self-pay | Admitting: *Deleted

## 2023-03-31 ENCOUNTER — Other Ambulatory Visit: Payer: Self-pay

## 2023-03-31 DIAGNOSIS — R197 Diarrhea, unspecified: Secondary | ICD-10-CM

## 2023-03-31 DIAGNOSIS — C9000 Multiple myeloma not having achieved remission: Secondary | ICD-10-CM | POA: Diagnosis not present

## 2023-03-31 LAB — CLOSTRIDIUM DIFFICILE BY PCR, REFLEXED: Toxigenic C. Difficile by PCR: NEGATIVE

## 2023-03-31 LAB — C DIFFICILE QUICK SCREEN W PCR REFLEX
C Diff antigen: POSITIVE — AB
C Diff toxin: NEGATIVE

## 2023-03-31 NOTE — Telephone Encounter (Signed)
Patient has had loose stools like she had when she experienced Cdiff in June.  She has a specimen that she collected and will bring for [place] to send off.

## 2023-04-03 ENCOUNTER — Other Ambulatory Visit: Payer: Self-pay

## 2023-04-03 MED ORDER — LENALIDOMIDE 10 MG PO CAPS: 10.0000 mg | ORAL_CAPSULE | Freq: Every day | ORAL | 0 refills | Status: DC

## 2023-04-03 NOTE — Telephone Encounter (Signed)
Chart reviewed. Revlimid refilled per last office note with Dr. Katragadda.  

## 2023-04-10 ENCOUNTER — Encounter (HOSPITAL_COMMUNITY): Payer: Self-pay | Admitting: Hematology

## 2023-04-10 ENCOUNTER — Encounter: Payer: Self-pay | Admitting: Hematology

## 2023-04-10 NOTE — Progress Notes (Signed)
Refill medications

## 2023-04-15 ENCOUNTER — Inpatient Hospital Stay: Payer: Medicare Other | Attending: Hematology | Admitting: Hematology

## 2023-04-15 ENCOUNTER — Other Ambulatory Visit: Payer: Self-pay

## 2023-04-15 ENCOUNTER — Inpatient Hospital Stay: Payer: Medicare Other

## 2023-04-15 VITALS — BP 123/59 | HR 62 | Temp 98.3°F | Resp 18

## 2023-04-15 DIAGNOSIS — C9000 Multiple myeloma not having achieved remission: Secondary | ICD-10-CM

## 2023-04-15 DIAGNOSIS — E876 Hypokalemia: Secondary | ICD-10-CM

## 2023-04-15 LAB — COMPREHENSIVE METABOLIC PANEL
ALT: 27 U/L (ref 0–44)
AST: 22 U/L (ref 15–41)
Albumin: 3.9 g/dL (ref 3.5–5.0)
Alkaline Phosphatase: 58 U/L (ref 38–126)
Anion gap: 9 (ref 5–15)
BUN: 17 mg/dL (ref 8–23)
CO2: 26 mmol/L (ref 22–32)
Calcium: 8.6 mg/dL — ABNORMAL LOW (ref 8.9–10.3)
Chloride: 98 mmol/L (ref 98–111)
Creatinine, Ser: 1.03 mg/dL — ABNORMAL HIGH (ref 0.44–1.00)
GFR, Estimated: 57 mL/min — ABNORMAL LOW (ref >60)
Glucose, Bld: 110 mg/dL — ABNORMAL HIGH (ref 70–99)
Potassium: 3.3 mmol/L — ABNORMAL LOW (ref 3.5–5.1)
Sodium: 133 mmol/L — ABNORMAL LOW (ref 135–145)
Total Bilirubin: 0.3 mg/dL (ref 0.3–1.2)
Total Protein: 6.7 g/dL (ref 6.5–8.1)

## 2023-04-15 LAB — CBC WITH DIFFERENTIAL/PLATELET
Abs Immature Granulocytes: 0.02 10*3/uL (ref 0.00–0.07)
Basophils Absolute: 0.1 10*3/uL (ref 0.0–0.1)
Basophils Relative: 1 %
Eosinophils Absolute: 0.2 10*3/uL (ref 0.0–0.5)
Eosinophils Relative: 3 %
HCT: 36.4 % (ref 36.0–46.0)
Hemoglobin: 11.3 g/dL — ABNORMAL LOW (ref 12.0–15.0)
Immature Granulocytes: 0 %
Lymphocytes Relative: 22 %
Lymphs Abs: 1.4 10*3/uL (ref 0.7–4.0)
MCH: 29 pg (ref 26.0–34.0)
MCHC: 31 g/dL (ref 30.0–36.0)
MCV: 93.3 fL (ref 80.0–100.0)
Monocytes Absolute: 0.6 10*3/uL (ref 0.1–1.0)
Monocytes Relative: 9 %
Neutro Abs: 3.9 10*3/uL (ref 1.7–7.7)
Neutrophils Relative %: 65 %
Platelets: 275 10*3/uL (ref 150–400)
RBC: 3.9 MIL/uL (ref 3.87–5.11)
RDW: 17.4 % — ABNORMAL HIGH (ref 11.5–15.5)
WBC: 6.1 10*3/uL (ref 4.0–10.5)
nRBC: 0 % (ref 0.0–0.2)

## 2023-04-15 LAB — MAGNESIUM: Magnesium: 1.8 mg/dL (ref 1.7–2.4)

## 2023-04-15 LAB — LACTATE DEHYDROGENASE: LDH: 122 U/L (ref 98–192)

## 2023-04-15 MED ORDER — ZOLEDRONIC ACID 4 MG/100ML IV SOLN
4.0000 mg | Freq: Once | INTRAVENOUS | Status: AC
  Administered 2023-04-15: 4 mg via INTRAVENOUS
  Filled 2023-04-15: qty 100

## 2023-04-15 MED ORDER — SODIUM CHLORIDE 0.9 % IV SOLN: Freq: Once | INTRAVENOUS | Status: AC

## 2023-04-15 MED ORDER — POTASSIUM CHLORIDE CRYS ER 20 MEQ PO TBCR
40.0000 meq | EXTENDED_RELEASE_TABLET | Freq: Once | ORAL | Status: AC
  Administered 2023-04-15: 40 meq via ORAL
  Filled 2023-04-15: qty 2

## 2023-04-15 NOTE — Progress Notes (Signed)
[facility] [address], [place] [address]    Clinic Day:  04/15/23   Referring physician: Maximiano Coss,*  Patient Care Team: Maximiano Coss, MD as PCP - General (Family Medicine) Doreatha Massed, MD as Medical Oncologist (Medical Oncology)   ASSESSMENT & PLAN:   Assessment: 1. 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 Jacklynn Ganong - She reported discomfort in the left posterior back for the last 1 month.  She was seen by Dr. Leary Roca and x-rays showed abnormality.  A CT scan of the chest was done on 05/24/2021. - CT chest report reviewed by me from 05/24/2021 showed expansile oval-shaped lesion within the posterior aspect of the seventh rib with a nodule measuring 2.1 x 1.1 cm.  Adjacent cortex demonstrates area of thinning and destruction.  Stable emphysematous and interstitial changes within the lungs.  Stable pulmonary nodules within the right upper lobe and left upper lobe when compared to CT from September 2020. - CT of the abdomen and pelvis did not show any significant abnormalities. - She denies any fevers, night sweats or weight loss in the last 6 months.  She had history of basal cell carcinoma on the nose removed by Mohs surgery. - She also reported history of osteoporosis and broke left upper rib 1 year ago after sneezing. -Reviewed PET scan from 06/14/2021.  Mild hypermetabolism involving left aspect of the L5 vertebral body and also pedicle region surrounding the screw.  SUV 4.2.  There appears to be a lytic process on the CT scan.  The lytic left posterior seventh rib lesion is mildly hypermetabolic with SUV 2.98.  No other definite lytic hypermetabolic bone lesions seen. - We have reviewed left seventh rib bone biopsy from 06/25/2021 consistent with plasma cell neoplasm.  Cells are positive for CD138, CD56 and a kappa restricted.  - Labs on 06/13/2021 M spike 1.7 g.   Immunofixation IgA kappa.  Free light chain shows kappa light chain 71, ratio 8.22.  LDH and beta-2 microglobulin was normal. - Bone marrow biopsy on 07/04/2021 consistent with plasma cell neoplasm with 26% plasma cells on aspirate with atypical features and 50 to 60% on core biopsy.  Chromosome analysis was normal.  Multiple myeloma FISH panel shows loss of NF1/chromosome 17/17 q.  Gain of CCN D1 or chromosome 11/11 q.  Gain of chromosome 4/4P.  Loss of material from the long-arm of chromosome 13.  These are all standard risk features. - 24-hour urine total protein to 40 mg.  Urine immunofixation was negative.  Urine kappa light chains were 4.13. - 8 cycles of Dara RVD from 07/26/2021 through 03/01/2022 - T11 and T12 vertebroplasty by Dr. Jake Samples on 08/31/2021. - She has decided against bone marrow transplant after the advice of Dr. Valentina Lucks. - Maintenance daratumumab discontinued on 08/29/2022.  Maintenance Revlimid 10 mg 3 weeks on/1 week off ongoing.   2. Social/family history: - She is married and lives at home with her husband.  She has not worked but had help her husband and his work.  She is a non-smoker. - Maternal grandmother had colon cancer.  Mother had a squamous cell carcinoma of the skin.    Plan: Stage I IgA kappa plasma cell myeloma, standard risk: - Reviewed multiple myeloma labs from 02/17/2023: M spike is negative.  FLC ratio is mildly elevated at 1.73.  Kappa light chains are normal at 16.3. - Reviewed labs from today: Normal  LFTs.  Creatinine mildly elevated at 1.03.  LDH was normal.  CBC grossly normal. - Recommend continuing Revlimid 10 mg 3 weeks on 1 week off. - Myeloma labs from today are pending.  Recommend RTC 2 months with repeat labs.  2.  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 - Dr. Valentina Lucks at [facility] did not recommend any further antibiotics.  3.  Thromboprophylaxis: -  Continue aspirin 81 mg daily.  4.  Myeloma bone disease: - Calcium today is 8.6 with albumin of 3.9.  Continue Zometa today and every 4 weeks.  5.  Recurrent C. difficile infection: - Stool testing on 03/31/2023 showed C. difficile antigen positive but PCR was negative. - She has followed up with Dr. Samuella Cota.  She was given another 14-day course of Dificid.  She reports improvement in diarrhea.    Orders Placed This Encounter  Procedures   CBC with Differential    Standing Status:   Standing    Number of Occurrences:   10    Standing Expiration Date:   04/14/2024   Comprehensive metabolic panel    Standing Status:   Standing    Number of Occurrences:   10    Standing Expiration Date:   04/14/2024   Magnesium    Standing Status:   Standing    Number of Occurrences:   10    Standing Expiration Date:   04/14/2024   Kappa/lambda light chains    Standing Status:   Standing    Number of Occurrences:   10    Standing Expiration Date:   04/14/2024   Immunofixation electrophoresis    Standing Status:   Standing    Number of Occurrences:   10    Standing Expiration Date:   04/14/2024   Protein electrophoresis, serum    Standing Status:   Standing    Number of Occurrences:   10    Standing Expiration Date:   04/14/2024      Mikeal Hawthorne R Teague,acting as a scribe for Doreatha Massed, MD.,have documented all relevant documentation on the behalf of Doreatha Massed, MD,as directed by  Doreatha Massed, MD while in the presence of Doreatha Massed, MD.  I, Doreatha Massed MD, have reviewed the above documentation for accuracy and completeness, and I agree with the above.     Doreatha Massed, MD   10/1/20244:20 PM  CHIEF COMPLAINT:   Diagnosis: multiple myeloma    Cancer Staging  Multiple myeloma (HCC) Staging form: Multiple Myeloma, AJCC 6th Edition - Clinical stage from 07/17/2021: Stage IA - Unsigned    Prior Therapy: DaraVRd (Daratumumab SQ) q21d x 6 Cycles  (Induction/Consolidation)   Current Therapy:  Revlimid maintenance    HISTORY OF PRESENT ILLNESS:   Oncology History  Multiple myeloma (HCC)  07/17/2021 Initial Diagnosis   Multiple myeloma (HCC)   07/26/2021 - 03/15/2022 Chemotherapy   Patient is on Treatment Plan : MYELOMA NEWLY DIAGNOSED TRANSPLANT CANDIDATE DaraVRd (Daratumumab SQ) q21d x 6 Cycles (Induction/Consolidation)     04/17/2022 -  Chemotherapy   Patient is on Treatment Plan : MYELOMA NEWLY DIAGNOSED TRANSPLANT CANDIDATE Daratumumab SQ + Lenalidomide q28d (Maintenance)        INTERVAL HISTORY:   Amanda Conner is a 72 y.o. female presenting to clinic today for follow up of multiple myeloma. She was last seen by me on 02/17/23.  Today, she states that she is doing well overall. Her appetite level is at 60%. Her energy level  is at 50%. She is accompanied by her husband. She is tolerating Revlimid well without any side effects, including dental issues. She is taking Zometa every 4 weeks. Her diarrhea has improved since her last visit and she is taking a second round of Dificid. C. Diff antigen was positive, but C. Diff toxin and C. Diff by PCR was negative on 03/31/23. She is unsure if she has an infection. She also notes she now has constipation.   Her husband has his second knee replacement on 06/06/23 and would like to bump her Zometa treatment back a week or skip it next month. She also has Moh's surgery planned in the next few weeks due to a squamous cell of the skin ion her scalp.   PAST MEDICAL HISTORY:   Past Medical History: Past Medical History:  Diagnosis Date   Arthritis    osteoarthritis   Back pain    Bronchiectasis (HCC)    Cancer (HCC)    basal cell nose   Chronic cough    Complication of anesthesia    Dyspnea    Gastrointestinal symptoms    GERD (gastroesophageal reflux disease)    Glaucoma    History of hiatal hernia    Hyperlipemia    Hypertension    Multiple myeloma (HCC)    Osteopenia    Pneumonia     PONV (postoperative nausea and vomiting)    "only one time"   Spondylisthesis     Surgical History: Past Surgical History:  Procedure Laterality Date   ABDOMINAL EXPOSURE N/A 12/29/2018   Procedure: ABDOMINAL EXPOSURE;  Surgeon: Chuck Hint, MD;  Location: [facility] OR;  Service: Vascular;  Laterality: N/A;   ABDOMINOPLASTY  2002   ABDOMINOPLASTY  1970's   ANKLE SURGERY Left 10/2015   ANTERIOR LATERAL LUMBAR FUSION WITH PERCUTANEOUS SCREW 3 LEVEL Left 12/29/2018   Procedure: Lumbar Two to Lumbar Five Anterolateral lumbar interbody fusion;  Surgeon: Maeola Harman, MD;  Location: [facility] OR;  Service: Neurosurgery;  Laterality: Left;  anterolateral   ANTERIOR LUMBAR FUSION N/A 12/29/2018   Procedure: Lumbar Five Sacral One Anterior lumbar interbody fusion;  Surgeon: Maeola Harman, MD;  Location: [facility] OR;  Service: Neurosurgery;  Laterality: N/A;  anterior approach   BASAL CELL CARCINOMA EXCISION  06/2018   nose   BLEPHAROPLASTY Bilateral 1999   BREAST ENHANCEMENT SURGERY  1970's   COLONOSCOPY     CYSTOURETHROSCOPY  2018   Doble J ureteal stents   DECOMPRESSION CORE HIP Left 2014   FACIAL COSMETIC SURGERY  2004   FLUOROSCOPY GUIDANCE     HIP ARTHROPLASTY Left    INCISIONAL HERNIA REPAIR N/A 10/08/2019   Procedure: OPEN INCISIONAL HERNIA REPAIR WITH MESH;  Surgeon: Darnell Level, MD;  Location: [facility] ORS;  Service: General;  Laterality: N/A;   JOINT REPLACEMENT Left    Hip; 05/2014   KYPHOPLASTY N/A 08/31/2021   Procedure: KYPHOPLASTY THORACIC ELEVEN, THORACIC TWELVE;  Surgeon: Bethann Goo, DO;  Location: [facility] OR;  Service: Neurosurgery;  Laterality: N/A;   LAPAROSCOPIC PARTIAL COLECTOMY  06/26/2017   LUMBAR PERCUTANEOUS PEDICLE SCREW 4 LEVEL N/A 12/29/2018   Procedure: Lumbar Percutaneous Pedicle Screw Placement Lumbar two-Sacral one;  Surgeon: Maeola Harman, MD;  Location: [facility] OR;  Service: Neurosurgery;  Laterality: N/A;   MOHS SURGERY  2019   nose   PARTIAL COLECTOMY  06/2017   for  diverticulitis   REMOVAL OF BILATERAL TISSUE EXPANDERS WITH PLACEMENT OF BILATERAL BREAST IMPLANTS  2004   THIGH LIFT  1994  TOE FUSION Right    great toe   TONSILLECTOMY     UMBILICAL HERNIA REPAIR     with tummy tuck   UMBILICAL HERNIA REPAIR  2002    Social History: Social History   Socioeconomic History   Marital status: Married    Spouse name: Not on file   Number of children: Not on file   Years of education: Not on file   Highest education level: Not on file  Occupational History   Not on file  Tobacco Use   Smoking status: Never   Smokeless tobacco: Never  Vaping Use   Vaping status: Never Used  Substance and Sexual Activity   Alcohol use: Yes    Comment: occasional   Drug use: Never   Sexual activity: Not on file  Other Topics Concern   Not on file  Social History Narrative   Not on file   Social Determinants of Health   Financial Resource Strain: Low Risk  (07/24/2021)   Overall Financial Resource Strain (CARDIA)    Difficulty of Paying Living Expenses: Not hard at all  Food Insecurity: No Food Insecurity (03/04/2022)   Received from [facility]   Hunger Vital Sign  Transportation Needs: No Transportation Needs (07/24/2021)   PRAPARE - Administrator, Civil Service (Medical): No    Lack of Transportation (Non-Medical): No  Physical Activity: Not on file  Stress: Not on file  Social Connections: Socially Integrated (07/24/2021)   Social Connection and Isolation Panel [NHANES]    Frequency of Communication with Friends and Family: More than three times a week    Frequency of Social Gatherings with Friends and Family: More than three times a week    Attends Religious Services: 1 to 4 times per year    Active Member of [employer] or Organizations: No    Attends Engineer, structural: 1 to 4 times per year    Marital Status: Married  Catering manager Violence: Not on file    Family History: Family History  Problem  Relation Age of Onset   Hypertension Mother    COPD Mother    Hypertension Father    Heart disease Father     Current Medications:  Current Outpatient Medications:    albuterol (VENTOLIN HFA) 108 (90 Base) MCG/ACT inhaler, Inhale 2 puffs into the lungs every 6 (six) hours as needed for wheezing or shortness of breath., Disp: , Rfl:    ammonium lactate (AMLACTIN) 12 % cream, SMARTSIG:2-4 Gram(s) Topical Twice Daily, Disp: , Rfl:    ANORO ELLIPTA 62.5-25 MCG/ACT AEPB, Inhale 1 puff into the lungs daily., Disp: , Rfl:    aspirin EC 81 MG tablet, Take 81 mg by mouth daily. Swallow whole., Disp: , Rfl:    Baclofen 5 MG TABS, Take 1 tablet by mouth 3 (three) times daily as needed., Disp: , Rfl:    benzonatate (TESSALON) 100 MG capsule, Take 100 mg by mouth 3 (three) times daily as needed for cough., Disp: , Rfl:    Biotin 1000 MCG tablet, Take 1,000 mcg by mouth daily., Disp: , Rfl:    calcitonin, salmon, (MIACALCIN/FORTICAL) 200 UNIT/ACT nasal spray, Place 1 spray into alternate nostrils daily., Disp: , Rfl:    Calcium Carb-Cholecalciferol 600-800 MG-UNIT TABS, Take 1 tablet by mouth daily., Disp: , Rfl:    daratumumab-hyaluronidase-fihj (DARZALEX FASPRO) 1800-30000 MG-UT/15ML SOLN, Inject 1,800 mg into the skin once a week., Disp: , Rfl:    dexamethasone (DECADRON) 2 MG  tablet, Take 5 tablets (10 mg total) by mouth every 30 (thirty) days., Disp: 20 tablet, Rfl: 4   dextromethorphan-guaiFENesin (MUCINEX DM) 30-600 MG 12hr tablet, Take 1 tablet by mouth daily. , Disp: , Rfl:    estrogen, conjugated,-medroxyprogesterone (PREMPRO) 0.45-1.5 MG tablet, Take 1 tablet by mouth daily., Disp: , Rfl:    feeding supplement (ENSURE ENLIVE / ENSURE PLUS) LIQD, Take 237 mLs by mouth 2 (two) times daily between meals. (Patient taking differently: Take 237 mLs by mouth daily.), Disp: 237 mL, Rfl: 12   fidaxomicin (DIFICID) 200 MG TABS tablet, Take 1 tablet (200 mg total) by mouth 2 (two) times daily., Disp: 20  tablet, Rfl: 0   fluticasone (FLONASE) 50 MCG/ACT nasal spray, Place 1 spray into both nostrils daily., Disp: , Rfl:    furosemide (LASIX) 20 MG tablet, TAKE 1 TABLET BY MOUTH EVERY DAY AS NEEDED, Disp: 90 tablet, Rfl: 3   hydrochlorothiazide (HYDRODIURIL) 25 MG tablet, Take 25 mg by mouth daily as needed (swelling)., Disp: , Rfl:    HYDROcodone bit-homatropine (HYCODAN) 5-1.5 MG/5ML syrup, Take 5 mLs by mouth every 6 (six) hours as needed for cough., Disp: 473 mL, Rfl: 0   Lactulose 20 GM/30ML SOLN, Take 30 ml by mouth every 3 hours until bowel movement is had, then take 30 ml daily (Patient taking differently: Take 20 g by mouth daily as needed (constipation).), Disp: 946 mL, Rfl: 3   latanoprost (XALATAN) 0.005 % ophthalmic solution, Place 1 drop into both eyes at bedtime. , Disp: , Rfl:    lenalidomide (REVLIMID) 10 MG capsule, Take 1 capsule (10 mg total) by mouth daily. 21 days on, 7 days off, Disp: 21 capsule, Rfl: 0   linaclotide (LINZESS) 145 MCG CAPS capsule, Take 145 mcg by mouth daily as needed (constipation)., Disp: , Rfl:    lisinopril (ZESTRIL) 20 MG tablet, Take 20 mg by mouth daily., Disp: , Rfl:    loratadine (CLARITIN) 10 MG tablet, Take 10 mg by mouth daily., Disp: , Rfl:    loteprednol (LOTEMAX) 0.5 % ophthalmic suspension, Place into the left eye., Disp: , Rfl:    methocarbamol (ROBAXIN) 500 MG tablet, Take 1 tablet (500 mg total) by mouth every 6 (six) hours as needed for muscle spasms., Disp: 40 tablet, Rfl: 3   omeprazole (PRILOSEC) 20 MG capsule, Take 20 mg by mouth daily., Disp: , Rfl:    ondansetron (ZOFRAN ODT) 4 MG disintegrating tablet, Take 1 tablet (4 mg total) by mouth every 4 (four) hours as needed for nausea or vomiting., Disp: 60 tablet, Rfl: 3   Oxycodone HCl 10 MG TABS, Take 1 tablet (10 mg total) by mouth every 6 (six) hours as needed., Disp: 120 tablet, Rfl: 0   pantoprazole (PROTONIX) 40 MG tablet, Take 40 mg by mouth daily., Disp: , Rfl:    predniSONE  (DELTASONE) 10 MG tablet, TAKE 6 TABLETS ON DAY 1 AS DIRECTED ON PACKAGE AND DECREASE BY 1 TAB EACH DAY FOR A TOTAL OF 6 DAYS, Disp: , Rfl:    sucralfate (CARAFATE) 1 g tablet, Take 1 g by mouth 4 (four) times daily., Disp: , Rfl:    SUMAtriptan (IMITREX) 100 MG tablet, Take 100 mg by mouth every 2 (two) hours as needed for migraine. May repeat in 2 hours if headache persists or recurs., Disp: , Rfl:    tiZANidine (ZANAFLEX) 2 MG tablet, Take 2 mg by mouth every 6 (six) hours as needed., Disp: , Rfl:    UNABLE TO  FIND, Place 0.5 g into both eyes 3 (three) times daily. Med Name: Loteman (otepredol), Disp: , Rfl:    Allergies: Allergies  Allergen Reactions   Tobramycin-Dexamethasone Other (See Comments)    Itching and swelling   Ethambutol Other (See Comments)    Fever   Neomycin-Polymyxin-Dexameth Itching and Swelling   Amikacin Other (See Comments)    Eosinophilia with chills and aching when given with cefoxitin   Biaxin [Clarithromycin] Itching   Cefoxitin Other (See Comments)    Eosinophlia when given with amikacin   Sulfamethoxazole-Trimethoprim Itching   Vancomycin     "Got red splotches on skin after several doses"   Bacitracin-Polymyxin B Rash    REVIEW OF SYSTEMS:   Review of Systems  Constitutional:  Negative for chills, fatigue and fever.  HENT:   Positive for trouble swallowing. Negative for lump/mass, mouth sores, nosebleeds and sore throat.   Eyes:  Negative for eye problems.  Respiratory:  Positive for cough and shortness of breath.   Cardiovascular:  Negative for chest pain, leg swelling and palpitations.  Gastrointestinal:  Positive for constipation and nausea. Negative for abdominal pain, diarrhea and vomiting.  Genitourinary:  Negative for bladder incontinence, difficulty urinating, dysuria, frequency, hematuria and nocturia.   Musculoskeletal:  Positive for back pain (7/10 severity). Negative for arthralgias, flank pain, myalgias and neck pain.  Skin:  Negative  for itching and rash.  Neurological:  Positive for headaches. Negative for dizziness and numbness.  Hematological:  Does not bruise/bleed easily.  Psychiatric/Behavioral:  Positive for sleep disturbance. Negative for depression and suicidal ideas. The patient is not nervous/anxious.   All other systems reviewed and are negative.    VITALS:   Blood pressure (!) 123/59, pulse 62, temperature 98.3 F (36.8 C), temperature source Oral, resp. rate 18, SpO2 100%.  Wt Readings from Last 3 Encounters:  03/18/23 104 lb 9.6 oz (47.4 kg)  02/17/23 105 lb 11.2 oz (47.9 kg)  01/20/23 106 lb 6.4 oz (48.3 kg)    There is no height or weight on file to calculate BMI.  Performance status (ECOG): 1 - Symptomatic but completely ambulatory  PHYSICAL EXAM:   Physical Exam Vitals and nursing note reviewed. Exam conducted with a chaperone present.  Constitutional:      Appearance: Normal appearance.  Cardiovascular:     Rate and Rhythm: Normal rate and regular rhythm.     Pulses: Normal pulses.     Heart sounds: Normal heart sounds.  Pulmonary:     Effort: Pulmonary effort is normal.     Breath sounds: Normal breath sounds.  Abdominal:     Palpations: Abdomen is soft. There is no hepatomegaly, splenomegaly or mass.     Tenderness: There is no abdominal tenderness.  Musculoskeletal:     Right lower leg: No edema.     Left lower leg: No edema.  Lymphadenopathy:     Cervical: No cervical adenopathy.     Right cervical: No superficial, deep or posterior cervical adenopathy.    Left cervical: No superficial, deep or posterior cervical adenopathy.     Upper Body:     Right upper body: No supraclavicular or axillary adenopathy.     Left upper body: No supraclavicular or axillary adenopathy.  Neurological:     General: No focal deficit present.     Mental Status: She is alert and oriented to person, place, and time.  Psychiatric:        Mood and Affect: Mood normal.  Behavior: Behavior  normal.     LABS:      Latest Ref Rng & Units 04/15/2023   10:52 AM 03/18/2023   12:37 PM 02/17/2023   12:13 PM  CBC  WBC 4.0 - 10.5 K/uL 6.1  6.4  6.8   Hemoglobin 12.0 - 15.0 g/dL 16.1  09.6  04.5   Hematocrit 36.0 - 46.0 % 36.4  35.8  36.9   Platelets 150 - 400 K/uL 275  272  285       Latest Ref Rng & Units 04/15/2023   10:52 AM 03/18/2023   12:37 PM 02/17/2023   12:13 PM  CMP  Glucose 70 - 99 mg/dL 409  811  91   BUN 8 - 23 mg/dL 17  16  13    Creatinine 0.44 - 1.00 mg/dL 9.14  7.82  9.56   Sodium 135 - 145 mmol/L 133  133  133   Potassium 3.5 - 5.1 mmol/L 3.3  3.7  4.2   Chloride 98 - 111 mmol/L 98  98  97   CO2 22 - 32 mmol/L 26  26  28    Calcium 8.9 - 10.3 mg/dL 8.6  9.1  9.4   Total Protein 6.5 - 8.1 g/dL 6.7  7.0  7.3   Total Bilirubin 0.3 - 1.2 mg/dL 0.3  0.6  0.6   Alkaline Phos 38 - 126 U/L 58  66  76   AST 15 - 41 U/L 22  20  24    ALT 0 - 44 U/L 27  22  34      No results found for: "CEA1", "CEA" / No results found for: "CEA1", "CEA" No results found for: "PSA1" No results found for: "CAN199" No results found for: "CAN125"  Lab Results  Component Value Date   TOTALPROTELP 6.7 02/17/2023   TOTALPROTELP 6.6 02/17/2023   ALBUMINELP 4.1 02/17/2023   A1GS 0.4 02/17/2023   A2GS 0.7 02/17/2023   BETS 1.0 02/17/2023   GAMS 0.6 02/17/2023   MSPIKE Not Observed 02/17/2023   SPEI Comment 02/17/2023   No results found for: "TIBC", "FERRITIN", "IRONPCTSAT" Lab Results  Component Value Date   LDH 122 04/15/2023   LDH 162 04/17/2022   LDH 140 03/15/2022     STUDIES:   No results found.

## 2023-04-15 NOTE — Progress Notes (Signed)
Patient presents today for Zometa infusion. Patient is in satisfactory condition with no new complaints voiced.  Vital signs are stable.  Labs reviewed by Dr. Ellin Saba during the office visit and all labs are within treatment parameters.  Potassium today is 3.3.  We will give Klor Con 40 mEq PO x one dose today per standing orders by Dr. Ellin Saba.  We will proceed with treatment per MD orders.   Patient tolerated treatment well with no complaints voiced.  Patient left via wheelchair in stable condition.  Vital signs stable at discharge.  Follow up as scheduled.

## 2023-04-15 NOTE — Patient Instructions (Signed)
[facility]  Discharge Instructions: Thank you for choosing [facility] to provide your oncology and hematology care.  If you have a lab appointment with the Cancer Center - please note that after April 8th, 2024, all labs will be drawn in the cancer center.  You do not have to check in or register with the main entrance as you have in the past but will complete your check-in in the cancer center.  Wear comfortable clothing and clothing appropriate for easy access to any Portacath or PICC line.   We strive to give you quality time with your provider. You may need to reschedule your appointment if you arrive late (15 or more minutes).  Arriving late affects you and other patients whose appointments are after yours.  Also, if you miss three or more appointments without notifying the office, you may be dismissed from the clinic at the provider's discretion.      For prescription refill requests, have your pharmacy contact our office and allow 72 hours for refills to be completed.    Today you received the following: Zometa.  Zoledronic Acid Injection (Cancer) What is this medication? ZOLEDRONIC ACID (ZOE le dron ik AS id) treats high calcium levels in the blood caused by cancer. It may also be used with chemotherapy to treat weakened bones caused by cancer. It works by slowing down the release of calcium from bones. This lowers calcium levels in your blood. It also makes your bones stronger and less likely to break (fracture). It belongs to a group of medications called bisphosphonates. This medicine may be used for other purposes; ask your health care provider or pharmacist if you have questions. COMMON BRAND NAME(S): Zometa, Zometa Powder What should I tell my care team before I take this medication? They need to know if you have any of these conditions: Dehydration Dental disease Kidney disease Liver disease Low levels of calcium in the blood Lung or  breathing disease, such as asthma Receiving steroids, such as dexamethasone or prednisone An unusual or allergic reaction to zoledronic acid, other medications, foods, dyes, or preservatives Pregnant or trying to get pregnant Breast-feeding How should I use this medication? This medication is injected into a vein. It is given by your care team in a hospital or clinic setting. Talk to your care team about the use of this medication in children. Special care may be needed. Overdosage: If you think you have taken too much of this medicine contact a poison control center or emergency room at once. NOTE: This medicine is only for you. Do not share this medicine with others. What if I miss a dose? Keep appointments for follow-up doses. It is important not to miss your dose. Call your care team if you are unable to keep an appointment. What may interact with this medication? Certain antibiotics given by injection Diuretics, such as bumetanide, furosemide NSAIDs, medications for pain and inflammation, such as ibuprofen or naproxen Teriparatide Thalidomide This list may not describe all possible interactions. Give your health care provider a list of all the medicines, herbs, non-prescription drugs, or dietary supplements you use. Also tell them if you smoke, drink alcohol, or use illegal drugs. Some items may interact with your medicine. What should I watch for while using this medication? Visit your care team for regular checks on your progress. It may be some time before you see the benefit from this medication. Some people who take this medication have severe bone, joint, or  muscle pain. This medication may also increase your risk for jaw problems or a broken thigh bone. Tell your care team right away if you have severe pain in your jaw, bones, joints, or muscles. Tell you care team if you have any pain that does not go away or that gets worse. Tell your dentist and dental surgeon that you are taking  this medication. You should not have major dental surgery while on this medication. See your dentist to have a dental exam and fix any dental problems before starting this medication. Take good care of your teeth while on this medication. Make sure you see your dentist for regular follow-up appointments. You should make sure you get enough calcium and vitamin D while you are taking this medication. Discuss the foods you eat and the vitamins you take with your care team. Check with your care team if you have severe diarrhea, nausea, and vomiting, or if you sweat a lot. The loss of too much body fluid may make it dangerous for you to take this medication. You may need bloodwork while taking this medication. Talk to your care team if you wish to become pregnant or think you might be pregnant. This medication can cause serious birth defects. What side effects may I notice from receiving this medication? Side effects that you should report to your care team as soon as possible: Allergic reactions--skin rash, itching, hives, swelling of the face, lips, tongue, or throat Kidney injury--decrease in the amount of urine, swelling of the ankles, hands, or feet Low calcium level--muscle pain or cramps, confusion, tingling, or numbness in the hands or feet Osteonecrosis of the jaw--pain, swelling, or redness in the mouth, numbness of the jaw, poor healing after dental work, unusual discharge from the mouth, visible bones in the mouth Severe bone, joint, or muscle pain Side effects that usually do not require medical attention (report to your care team if they continue or are bothersome): Constipation Fatigue Fever Loss of appetite Nausea Stomach pain This list may not describe all possible side effects. Call your doctor for medical advice about side effects. You may report side effects to FDA at 1-800-FDA-1088. Where should I keep my medication? This medication is given in a hospital or clinic. It will not  be stored at home. NOTE: This sheet is a summary. It may not cover all possible information. If you have questions about this medicine, talk to your doctor, pharmacist, or health care provider.  2024 Elsevier/Gold Standard (2021-08-24 00:00:00)     To help prevent nausea and vomiting after your treatment, we encourage you to take your nausea medication as directed.  BELOW ARE SYMPTOMS THAT SHOULD BE REPORTED IMMEDIATELY: *FEVER GREATER THAN 100.4 F (38 C) OR HIGHER *CHILLS OR SWEATING *NAUSEA AND VOMITING THAT IS NOT CONTROLLED WITH YOUR NAUSEA MEDICATION *UNUSUAL SHORTNESS OF BREATH *UNUSUAL BRUISING OR BLEEDING *URINARY PROBLEMS (pain or burning when urinating, or frequent urination) *BOWEL PROBLEMS (unusual diarrhea, constipation, pain near the anus) TENDERNESS IN MOUTH AND THROAT WITH OR WITHOUT PRESENCE OF ULCERS (sore throat, sores in mouth, or a toothache) UNUSUAL RASH, SWELLING OR PAIN  UNUSUAL VAGINAL DISCHARGE OR ITCHING   Items with * indicate a potential emergency and should be followed up as soon as possible or go to the Emergency Department if any problems should occur.  Please show the CHEMOTHERAPY ALERT CARD or IMMUNOTHERAPY ALERT CARD at check-in to the Emergency Department and triage nurse.  Should you have questions after your visit or need to  cancel or reschedule your appointment, please contact [facility] [phone]  and follow the prompts.  Office hours are 8:00 a.m. to 4:30 p.m. Monday - Friday. Please note that voicemails left after 4:00 p.m. may not be returned until the following business day.  We are closed weekends and major holidays. You have access to a nurse at all times for urgent questions. Please call the main number to the clinic [phone] and follow the prompts.  For any non-urgent questions, you may also contact your provider using MyChart. We now offer e-Visits for anyone 74 and older to request care online for non-urgent  symptoms. For details visit [email or web address].   Also download the MyChart app! Go to the app store, search "MyChart", open the app, select [place], and log in with your MyChart username and password.

## 2023-04-15 NOTE — Patient Instructions (Signed)

## 2023-04-16 ENCOUNTER — Other Ambulatory Visit: Payer: Self-pay

## 2023-04-16 LAB — KAPPA/LAMBDA LIGHT CHAINS
Kappa free light chain: 25.8 mg/L — ABNORMAL HIGH (ref 3.3–19.4)
Kappa, lambda light chain ratio: 1.62 (ref 0.26–1.65)
Lambda free light chains: 15.9 mg/L (ref 5.7–26.3)

## 2023-04-18 LAB — PROTEIN ELECTROPHORESIS, SERUM
A/G Ratio: 1.6 (ref 0.7–1.7)
Albumin ELP: 3.9 g/dL (ref 2.9–4.4)
Alpha-1-Globulin: 0.3 g/dL (ref 0.0–0.4)
Alpha-2-Globulin: 0.6 g/dL (ref 0.4–1.0)
Beta Globulin: 0.9 g/dL (ref 0.7–1.3)
Gamma Globulin: 0.7 g/dL (ref 0.4–1.8)
Globulin, Total: 2.5 g/dL (ref 2.2–3.9)
Total Protein ELP: 6.4 g/dL (ref 6.0–8.5)

## 2023-04-21 LAB — IMMUNOFIXATION ELECTROPHORESIS
IgA: 190 mg/dL (ref 64–422)
IgG (Immunoglobin G), Serum: 680 mg/dL (ref 586–1602)
IgM (Immunoglobulin M), Srm: 73 mg/dL (ref 26–217)
Total Protein ELP: 6.5 g/dL (ref 6.0–8.5)

## 2023-04-29 ENCOUNTER — Other Ambulatory Visit: Payer: Self-pay

## 2023-05-02 ENCOUNTER — Other Ambulatory Visit: Payer: Self-pay

## 2023-05-02 MED ORDER — LENALIDOMIDE 10 MG PO CAPS: 10.0000 mg | ORAL_CAPSULE | Freq: Every day | ORAL | 0 refills | Status: DC

## 2023-05-02 NOTE — Telephone Encounter (Signed)
Chart reviewed. Revlimid refilled per last office note with Dr. Katragadda.  

## 2023-05-06 ENCOUNTER — Other Ambulatory Visit: Payer: Self-pay

## 2023-05-12 ENCOUNTER — Encounter: Payer: Self-pay | Admitting: Hematology

## 2023-05-12 ENCOUNTER — Encounter (HOSPITAL_COMMUNITY): Payer: Self-pay | Admitting: Hematology

## 2023-05-12 NOTE — Telephone Encounter (Signed)
Open in error

## 2023-05-27 ENCOUNTER — Other Ambulatory Visit: Payer: Self-pay

## 2023-05-27 MED ORDER — LENALIDOMIDE 10 MG PO CAPS: 10.0000 mg | ORAL_CAPSULE | Freq: Every day | ORAL | 0 refills | Status: DC

## 2023-05-27 NOTE — Telephone Encounter (Signed)
Chart reviewed. Revlimid refilled per last office note with Dr. Katragadda.  

## 2023-06-10 ENCOUNTER — Inpatient Hospital Stay: Payer: Medicare Other | Attending: Hematology

## 2023-06-10 ENCOUNTER — Inpatient Hospital Stay: Payer: Medicare Other

## 2023-06-10 ENCOUNTER — Inpatient Hospital Stay: Payer: Medicare Other | Admitting: Hematology

## 2023-06-10 DIAGNOSIS — C9000 Multiple myeloma not having achieved remission: Secondary | ICD-10-CM | POA: Insufficient documentation

## 2023-06-10 DIAGNOSIS — E876 Hypokalemia: Secondary | ICD-10-CM

## 2023-06-10 LAB — CBC WITH DIFFERENTIAL/PLATELET
Abs Immature Granulocytes: 0.03 10*3/uL (ref 0.00–0.07)
Basophils Absolute: 0.1 10*3/uL (ref 0.0–0.1)
Basophils Relative: 1 %
Eosinophils Absolute: 0.3 10*3/uL (ref 0.0–0.5)
Eosinophils Relative: 4 %
HCT: 36.3 % (ref 36.0–46.0)
Hemoglobin: 11.8 g/dL — ABNORMAL LOW (ref 12.0–15.0)
Immature Granulocytes: 0 %
Lymphocytes Relative: 23 %
Lymphs Abs: 1.5 10*3/uL (ref 0.7–4.0)
MCH: 31.1 pg (ref 26.0–34.0)
MCHC: 32.5 g/dL (ref 30.0–36.0)
MCV: 95.8 fL (ref 80.0–100.0)
Monocytes Absolute: 0.7 10*3/uL (ref 0.1–1.0)
Monocytes Relative: 10 %
Neutro Abs: 4.1 10*3/uL (ref 1.7–7.7)
Neutrophils Relative %: 62 %
Platelets: 276 10*3/uL (ref 150–400)
RBC: 3.79 MIL/uL — ABNORMAL LOW (ref 3.87–5.11)
RDW: 18.8 % — ABNORMAL HIGH (ref 11.5–15.5)
WBC: 6.7 10*3/uL (ref 4.0–10.5)
nRBC: 0 % (ref 0.0–0.2)

## 2023-06-10 LAB — COMPREHENSIVE METABOLIC PANEL
ALT: 36 U/L (ref 0–44)
AST: 35 U/L (ref 15–41)
Albumin: 4.7 g/dL (ref 3.5–5.0)
Alkaline Phosphatase: 58 U/L (ref 38–126)
Anion gap: 10 (ref 5–15)
BUN: 9 mg/dL (ref 8–23)
CO2: 25 mmol/L (ref 22–32)
Calcium: 9.3 mg/dL (ref 8.9–10.3)
Chloride: 97 mmol/L — ABNORMAL LOW (ref 98–111)
Creatinine, Ser: 1.14 mg/dL — ABNORMAL HIGH (ref 0.44–1.00)
GFR, Estimated: 51 mL/min — ABNORMAL LOW (ref >60)
Glucose, Bld: 89 mg/dL (ref 70–99)
Potassium: 3.4 mmol/L — ABNORMAL LOW (ref 3.5–5.1)
Sodium: 132 mmol/L — ABNORMAL LOW (ref 135–145)
Total Bilirubin: 0.9 mg/dL (ref <1.2)
Total Protein: 8 g/dL (ref 6.5–8.1)

## 2023-06-10 LAB — MAGNESIUM: Magnesium: 2.2 mg/dL (ref 1.7–2.4)

## 2023-06-10 MED ORDER — POTASSIUM CHLORIDE CRYS ER 20 MEQ PO TBCR
40.0000 meq | EXTENDED_RELEASE_TABLET | Freq: Once | ORAL | Status: AC
  Administered 2023-06-10: 40 meq via ORAL
  Filled 2023-06-10: qty 2

## 2023-06-10 NOTE — Patient Instructions (Signed)

## 2023-06-10 NOTE — Progress Notes (Signed)
Patient is refusing Zometa today due to C. Diff diagnosis.  Patient is currently taking oral Vancomycin and read that Vancomycin and Zometa could interact.  Potassium today is 3.4.  We will give Klor Con 40 mEq PO x one dose today per standing orders by Dr. Ellin Saba.  Patient left via wheelchair with husband in stable condition.

## 2023-06-11 LAB — KAPPA/LAMBDA LIGHT CHAINS
Kappa free light chain: 27.6 mg/L — ABNORMAL HIGH (ref 3.3–19.4)
Kappa, lambda light chain ratio: 1.29 (ref 0.26–1.65)
Lambda free light chains: 21.4 mg/L (ref 5.7–26.3)

## 2023-06-16 LAB — PROTEIN ELECTROPHORESIS, SERUM
A/G Ratio: 1.5 (ref 0.7–1.7)
Albumin ELP: 4.5 g/dL — ABNORMAL HIGH (ref 2.9–4.4)
Alpha-1-Globulin: 0.4 g/dL (ref 0.0–0.4)
Alpha-2-Globulin: 0.7 g/dL (ref 0.4–1.0)
Beta Globulin: 1.1 g/dL (ref 0.7–1.3)
Gamma Globulin: 0.8 g/dL (ref 0.4–1.8)
Globulin, Total: 3 g/dL (ref 2.2–3.9)
M-Spike, %: 0.6 g/dL — ABNORMAL HIGH
Total Protein ELP: 7.5 g/dL (ref 6.0–8.5)

## 2023-06-17 LAB — IMMUNOFIXATION ELECTROPHORESIS
IgA: 273 mg/dL (ref 64–422)
IgG (Immunoglobin G), Serum: 808 mg/dL (ref 586–1602)
IgM (Immunoglobulin M), Srm: 102 mg/dL (ref 26–217)
Total Protein ELP: 7.6 g/dL (ref 6.0–8.5)

## 2023-06-19 ENCOUNTER — Other Ambulatory Visit: Payer: Self-pay

## 2023-06-26 ENCOUNTER — Other Ambulatory Visit: Payer: Self-pay

## 2023-06-26 MED ORDER — LENALIDOMIDE 10 MG PO CAPS: 10.0000 mg | ORAL_CAPSULE | Freq: Every day | ORAL | 0 refills | Status: DC

## 2023-06-26 NOTE — Telephone Encounter (Signed)
Chart reviewed. Revlimid refilled per last office note with Dr. Katragadda.  

## 2023-07-08 ENCOUNTER — Other Ambulatory Visit: Payer: Medicare Other

## 2023-07-08 ENCOUNTER — Ambulatory Visit: Payer: Medicare Other | Admitting: Hematology

## 2023-07-08 ENCOUNTER — Ambulatory Visit: Payer: Medicare Other

## 2023-07-11 NOTE — Progress Notes (Signed)
Patient called stating that she has not really been eating and that she is still has C-Diff. Patient has been unable to get in touch with her GI doctor.  Patient was recommended to go to the Emergency department to be evaluated since she is not eating and has worsening C-Diff symptoms.

## 2023-07-14 ENCOUNTER — Inpatient Hospital Stay: Payer: Medicare Other

## 2023-07-14 ENCOUNTER — Inpatient Hospital Stay (HOSPITAL_BASED_OUTPATIENT_CLINIC_OR_DEPARTMENT_OTHER): Payer: Medicare Other | Admitting: Hematology

## 2023-07-14 ENCOUNTER — Inpatient Hospital Stay: Payer: Medicare Other | Attending: Hematology

## 2023-07-14 ENCOUNTER — Ambulatory Visit: Payer: Medicare Other | Admitting: Dietician

## 2023-07-14 VITALS — BP 119/72 | HR 60 | Temp 98.2°F | Resp 18 | Wt 100.5 lb

## 2023-07-14 DIAGNOSIS — C9001 Multiple myeloma in remission: Secondary | ICD-10-CM | POA: Diagnosis not present

## 2023-07-14 DIAGNOSIS — C9000 Multiple myeloma not having achieved remission: Secondary | ICD-10-CM

## 2023-07-14 DIAGNOSIS — Z7982 Long term (current) use of aspirin: Secondary | ICD-10-CM | POA: Insufficient documentation

## 2023-07-14 DIAGNOSIS — Z800 Family history of malignant neoplasm of digestive organs: Secondary | ICD-10-CM | POA: Insufficient documentation

## 2023-07-14 DIAGNOSIS — Z85828 Personal history of other malignant neoplasm of skin: Secondary | ICD-10-CM | POA: Insufficient documentation

## 2023-07-14 DIAGNOSIS — A0471 Enterocolitis due to Clostridium difficile, recurrent: Secondary | ICD-10-CM | POA: Insufficient documentation

## 2023-07-14 LAB — CBC WITH DIFFERENTIAL/PLATELET
Abs Immature Granulocytes: 0.01 10*3/uL (ref 0.00–0.07)
Basophils Absolute: 0.1 10*3/uL (ref 0.0–0.1)
Basophils Relative: 3 %
Eosinophils Absolute: 0.2 10*3/uL (ref 0.0–0.5)
Eosinophils Relative: 6 %
HCT: 36.9 % (ref 36.0–46.0)
Hemoglobin: 12.2 g/dL (ref 12.0–15.0)
Immature Granulocytes: 0 %
Lymphocytes Relative: 23 %
Lymphs Abs: 0.9 10*3/uL (ref 0.7–4.0)
MCH: 32.1 pg (ref 26.0–34.0)
MCHC: 33.1 g/dL (ref 30.0–36.0)
MCV: 97.1 fL (ref 80.0–100.0)
Monocytes Absolute: 0.2 10*3/uL (ref 0.1–1.0)
Monocytes Relative: 6 %
Neutro Abs: 2.4 10*3/uL (ref 1.7–7.7)
Neutrophils Relative %: 62 %
Platelets: 206 10*3/uL (ref 150–400)
RBC: 3.8 MIL/uL — ABNORMAL LOW (ref 3.87–5.11)
RDW: 17.6 % — ABNORMAL HIGH (ref 11.5–15.5)
WBC: 3.9 10*3/uL — ABNORMAL LOW (ref 4.0–10.5)
nRBC: 0 % (ref 0.0–0.2)

## 2023-07-14 LAB — COMPREHENSIVE METABOLIC PANEL
ALT: 51 U/L — ABNORMAL HIGH (ref 0–44)
AST: 64 U/L — ABNORMAL HIGH (ref 15–41)
Albumin: 4.7 g/dL (ref 3.5–5.0)
Alkaline Phosphatase: 57 U/L (ref 38–126)
Anion gap: 12 (ref 5–15)
BUN: 9 mg/dL (ref 8–23)
CO2: 23 mmol/L (ref 22–32)
Calcium: 9.7 mg/dL (ref 8.9–10.3)
Chloride: 95 mmol/L — ABNORMAL LOW (ref 98–111)
Creatinine, Ser: 1.22 mg/dL — ABNORMAL HIGH (ref 0.44–1.00)
GFR, Estimated: 47 mL/min — ABNORMAL LOW (ref >60)
Glucose, Bld: 139 mg/dL — ABNORMAL HIGH (ref 70–99)
Potassium: 3.7 mmol/L (ref 3.5–5.1)
Sodium: 130 mmol/L — ABNORMAL LOW (ref 135–145)
Total Bilirubin: 1 mg/dL (ref 0.0–1.2)
Total Protein: 8 g/dL (ref 6.5–8.1)

## 2023-07-14 LAB — MAGNESIUM: Magnesium: 2.3 mg/dL (ref 1.7–2.4)

## 2023-07-14 MED ORDER — ZOLEDRONIC ACID 4 MG/100ML IV SOLN
4.0000 mg | Freq: Once | INTRAVENOUS | Status: AC
  Administered 2023-07-14: 4 mg via INTRAVENOUS
  Filled 2023-07-14: qty 100

## 2023-07-14 MED ORDER — SODIUM CHLORIDE 0.9 % IV SOLN: Freq: Once | INTRAVENOUS | Status: AC

## 2023-07-14 NOTE — Patient Instructions (Signed)
[facility] at [facility] Discharge Instructions   You were seen and examined today by Dr. Ellin Saba.  He reviewed the results of your lab work which are normal/stable.   We will proceed with your Zometa infusion today.   Continue Revlimid as prescribed.   Return as scheduled.    Thank you for choosing [facility] at [facility] to provide your oncology and hematology care.  To afford each patient quality time with our provider, please arrive at least 15 minutes before your scheduled appointment time.   If you have a lab appointment with the Cancer Center please come in thru the Main Entrance and check in at the main information desk.  You need to re-schedule your appointment should you arrive 10 or more minutes late.  We strive to give you quality time with our providers, and arriving late affects you and other patients whose appointments are after yours.  Also, if you no show three or more times for appointments you may be dismissed from the clinic at the providers discretion.     Again, thank you for choosing [facility].  Our hope is that these requests will decrease the amount of time that you wait before being seen by our physicians.       _____________________________________________________________  Should you have questions after your visit to [facility], please contact our office at [phone] and follow the prompts.  Our office hours are 8:00 a.m. and 4:30 p.m. Monday - Friday.  Please note that voicemails left after 4:00 p.m. may not be returned until the following business day.  We are closed weekends and major holidays.  You do have access to a nurse 24-7, just call the main number to the clinic [phone] and do not press any options, hold on the line and a nurse will answer the phone.    For prescription refill requests, have your pharmacy contact our office and allow 72 hours.    Due to Covid,  you will need to wear a mask upon entering the hospital. If you do not have a mask, a mask will be given to you at the Main Entrance upon arrival. For doctor visits, patients may have 1 support person age 11 or older with them. For treatment visits, patients can not have anyone with them due to social distancing guidelines and our immunocompromised population.

## 2023-07-14 NOTE — Patient Instructions (Signed)
[facility] [place] - A DEPT OF [facility]  Discharge Instructions: Thank you for choosing [facility] to provide your oncology and hematology care.  If you have a lab appointment with the Cancer Center - please note that after April 8th, 2024, all labs will be drawn in the cancer center.  You do not have to check in or register with the main entrance as you have in the past but will complete your check-in in the cancer center.  Wear comfortable clothing and clothing appropriate for easy access to any Portacath or PICC line.   We strive to give you quality time with your provider. You may need to reschedule your appointment if you arrive late (15 or more minutes).  Arriving late affects you and other patients whose appointments are after yours.  Also, if you miss three or more appointments without notifying the office, you may be dismissed from the clinic at the provider's discretion.      For prescription refill requests, have your pharmacy contact our office and allow 72 hours for refills to be completed.    Today you received Zometa IV.     BELOW ARE SYMPTOMS THAT SHOULD BE REPORTED IMMEDIATELY: *FEVER GREATER THAN 100.4 F (38 C) OR HIGHER *CHILLS OR SWEATING *NAUSEA AND VOMITING THAT IS NOT CONTROLLED WITH YOUR NAUSEA MEDICATION *UNUSUAL SHORTNESS OF BREATH *UNUSUAL BRUISING OR BLEEDING *URINARY PROBLEMS (pain or burning when urinating, or frequent urination) *BOWEL PROBLEMS (unusual diarrhea, constipation, pain near the anus) TENDERNESS IN MOUTH AND THROAT WITH OR WITHOUT PRESENCE OF ULCERS (sore throat, sores in mouth, or a toothache) UNUSUAL RASH, SWELLING OR PAIN  UNUSUAL VAGINAL DISCHARGE OR ITCHING   Items with * indicate a potential emergency and should be followed up as soon as possible or go to the Emergency Department if any problems should occur.  Please show the CHEMOTHERAPY ALERT CARD or IMMUNOTHERAPY ALERT CARD at check-in to the  Emergency Department and triage nurse.  Should you have questions after your visit or need to cancel or reschedule your appointment, please contact [facility] [place] - A DEPT OF Eligha Bridegroom [facility] [phone]  and follow the prompts.  Office hours are 8:00 a.m. to 4:30 p.m. Monday - Friday. Please note that voicemails left after 4:00 p.m. may not be returned until the following business day.  We are closed weekends and major holidays. You have access to a nurse at all times for urgent questions. Please call the main number to the clinic [phone] and follow the prompts.  For any non-urgent questions, you may also contact your provider using MyChart. We now offer e-Visits for anyone 47 and older to request care online for non-urgent symptoms. For details visit [email or web address].   Also download the MyChart app! Go to the app store, search "MyChart", open the app, select [place], and log in with your MyChart username and password.

## 2023-07-14 NOTE — Progress Notes (Signed)
Patient is taking Revlimid as prescribed.  She has not missed any doses and reports no side effects at this time.   

## 2023-07-14 NOTE — Progress Notes (Signed)
Nutrition Follow-up:  Patient with multiple myeloma in remission. Patient is currently receiving maintenance Revlimid + zometa  Last seen by RD on 11/01/21  Met with patient and husband in infusion. 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 Patient did not require hospitalization and is followed by GI in [place]. Patient reports she has not had a bowel movement in the last 3 days, which has been a welcomed change. She is following the brat diet at this time. Patient has questions about advancing diet as she is tired of eating the same things.     Medications: reviewed   Labs: Na 130, glucose 139, Cr 1.22  Anthropometrics: Wt 100 lb 8 oz today - trending down  9/3 - 104 lb 9.6 oz 8/5 - 105 lb 11.2 oz 7/8 - 106 lb 6.4 oz    NUTRITION DIAGNOSIS: Unintentional weight loss - continues    INTERVENTION:  Discussed introducing new foods one at a time and in small portion Educated on high and low fiber foods, recommend gradual increase in fiber Recommend drinking Gatorlyte/Liquid IV to replace GI losses Bowel regimen per GI     MONITORING, EVALUATION, GOAL: wt trends, intake   NEXT VISIT: Monday January 13 via telephone

## 2023-07-14 NOTE — Progress Notes (Signed)
[facility] [address], [place] [address]    Clinic Day:  07/14/23   Referring physician: Maximiano Coss,*  Patient Care Team: Maximiano Coss, MD as PCP - General (Family Medicine) Doreatha Massed, MD as Medical Oncologist (Medical Oncology)   ASSESSMENT & PLAN:   Assessment: 1. 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 Jacklynn Ganong - She reported discomfort in the left posterior back for the last 1 month.  She was seen by Dr. Leary Roca and x-rays showed abnormality.  A CT scan of the chest was done on 05/24/2021. - CT chest report reviewed by me from 05/24/2021 showed expansile oval-shaped lesion within the posterior aspect of the seventh rib with a nodule measuring 2.1 x 1.1 cm.  Adjacent cortex demonstrates area of thinning and destruction.  Stable emphysematous and interstitial changes within the lungs.  Stable pulmonary nodules within the right upper lobe and left upper lobe when compared to CT from September 2020. - CT of the abdomen and pelvis did not show any significant abnormalities. - She denies any fevers, night sweats or weight loss in the last 6 months.  She had history of basal cell carcinoma on the nose removed by Mohs surgery. - She also reported history of osteoporosis and broke left upper rib 1 year ago after sneezing. -Reviewed PET scan from 06/14/2021.  Mild hypermetabolism involving left aspect of the L5 vertebral body and also pedicle region surrounding the screw.  SUV 4.2.  There appears to be a lytic process on the CT scan.  The lytic left posterior seventh rib lesion is mildly hypermetabolic with SUV 2.98.  No other definite lytic hypermetabolic bone lesions seen. - We have reviewed left seventh rib bone biopsy from 06/25/2021 consistent with plasma cell neoplasm.  Cells are positive for CD138, CD56 and a kappa restricted.  - Labs on 06/13/2021 M spike 1.7 g.   Immunofixation IgA kappa.  Free light chain shows kappa light chain 71, ratio 8.22.  LDH and beta-2 microglobulin was normal. - Bone marrow biopsy on 07/04/2021 consistent with plasma cell neoplasm with 26% plasma cells on aspirate with atypical features and 50 to 60% on core biopsy.  Chromosome analysis was normal.  Multiple myeloma FISH panel shows loss of NF1/chromosome 17/17 q.  Gain of CCN D1 or chromosome 11/11 q.  Gain of chromosome 4/4P.  Loss of material from the long-arm of chromosome 13.  These are all standard risk features. - 24-hour urine total protein to 40 mg.  Urine immunofixation was negative.  Urine kappa light chains were 4.13. - 8 cycles of Dara RVD from 07/26/2021 through 03/01/2022 - T11 and T12 vertebroplasty by Dr. Jake Samples on 08/31/2021. - She has decided against bone marrow transplant after the advice of Dr. Valentina Lucks. - Maintenance daratumumab discontinued on 08/29/2022.  Maintenance Revlimid 10 mg 3 weeks on/1 week off ongoing.   2. Social/family history: - She is married and lives at home with her husband.  She has not worked but had help her husband and his work.  She is a non-smoker. - Maternal grandmother had colon cancer.  Mother had a squamous cell carcinoma of the skin.    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 - Reviewed multiple myeloma labs from 06/10/2023: M spike is 0.6.  FLC ratio is normal at 1.29 with kappa light chains 27.  Immunofixation was  normal. - Labs today: AST and ALT are minimally elevated at 64 and 51 with normal bilirubin.  Creatinine 1.22.  CBC with with mild leukopenia and normal ANC. - We have sent myeloma labs today.  I think the last M spike is insignificant as immunofixation and FLC ratio would normal. - Continue Revlimid maintenance 10 mg 3 weeks on/1 week off.  RTC 12 weeks for follow-up with repeat myeloma labs 1 week prior.  2.  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 - Dr. Valentina Lucks at [facility] did not recommend any further antibiotics.  3.  Thromboprophylaxis: - Continue aspirin 81 mg daily.  4.  Myeloma bone disease: - Calcium today is 9.7 with albumin 4.7. - She will receive Zometa today.  I will switch Zometa to every 12 weeks.  5.  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  Since last visit, she was treated with vancomycin followed by Dificid until 07/09/2023. - She will continue follow-up with Dr. Samuella Cota in [place].    No orders of the defined types were placed in this encounter.      Doreatha Massed, MD   12/30/202411:40 AM  CHIEF COMPLAINT:   Diagnosis: multiple myeloma    Cancer Staging  Multiple myeloma (HCC) Staging form: Multiple Myeloma, AJCC 6th Edition - Clinical stage from 07/17/2021: Stage IA - Unsigned    Prior Therapy: DaraVRd (Daratumumab SQ) q21d x 6 Cycles (Induction/Consolidation)   Current Therapy:  Revlimid maintenance    HISTORY OF PRESENT ILLNESS:   Oncology History  Multiple myeloma (HCC)  07/17/2021 Initial Diagnosis   Multiple myeloma (HCC)   07/26/2021 - 03/15/2022 Chemotherapy   Patient is on Treatment Plan : MYELOMA NEWLY DIAGNOSED TRANSPLANT CANDIDATE DaraVRd (Daratumumab SQ) q21d x 6 Cycles (Induction/Consolidation)     04/17/2022 -  Chemotherapy   Patient is on Treatment Plan : MYELOMA NEWLY DIAGNOSED TRANSPLANT CANDIDATE Daratumumab SQ + Lenalidomide q28d (Maintenance)        INTERVAL HISTORY:   Amanda Conner is a 72 y.o. female seen for follow-up of multiple myeloma.  She reports that she is having problems with diarrhea from C. difficile.  Since last visit she was treated with vancomycin and Dificid which she completed recently on the day of Christmas.  She reports her back pain is stable to slightly worse.  Reports appetite 40% and energy levels 25%.  PAST MEDICAL HISTORY:    Past Medical History: Past Medical History:  Diagnosis Date   Arthritis    osteoarthritis   Back pain    Bronchiectasis (HCC)    Cancer (HCC)    basal cell nose   Chronic cough    Complication of anesthesia    Dyspnea    Gastrointestinal symptoms    GERD (gastroesophageal reflux disease)    Glaucoma    History of hiatal hernia    Hyperlipemia    Hypertension    Multiple myeloma (HCC)    Osteopenia    Pneumonia    PONV (postoperative nausea and vomiting)    "only one time"   Spondylisthesis     Surgical History: Past Surgical History:  Procedure Laterality Date   ABDOMINAL EXPOSURE N/A 12/29/2018   Procedure: ABDOMINAL EXPOSURE;  Surgeon: Chuck Hint, MD;  Location: [facility] OR;  Service: Vascular;  Laterality: N/A;   ABDOMINOPLASTY  2002   ABDOMINOPLASTY  1970's   ANKLE SURGERY Left 10/2015   ANTERIOR LATERAL LUMBAR FUSION WITH PERCUTANEOUS SCREW  3 LEVEL Left 12/29/2018   Procedure: Lumbar Two to Lumbar Five Anterolateral lumbar interbody fusion;  Surgeon: Maeola Harman, MD;  Location: [facility] OR;  Service: Neurosurgery;  Laterality: Left;  anterolateral   ANTERIOR LUMBAR FUSION N/A 12/29/2018   Procedure: Lumbar Five Sacral One Anterior lumbar interbody fusion;  Surgeon: Maeola Harman, MD;  Location[facility] OR;  Service: Neurosurgery;  Laterality: N/A;  anterior approach   BASAL CELL CARCINOMA EXCISION  06/2018   nose   BLEPHAROPLASTY Bilateral 1999   BREAST ENHANCEMENT SURGERY  1970's   COLONOSCOPY     CYSTOURETHROSCOPY  2018   Doble J ureteal stents   DECOMPRESSION CORE HIP Left 2014   FACIAL COSMETIC SURGERY  2004   FLUOROSCOPY GUIDANCE     HIP ARTHROPLASTY Left    INCISIONAL HERNIA REPAIR N/A 10/08/2019   Procedure: OPEN INCISIONAL HERNIA REPAIR WITH MESH;  Surgeon: Darnell Level, MD;  Location: [facility] ORS;  Service: General;  Laterality: N/A;   JOINT REPLACEMENT Left    Hip; 05/2014   KYPHOPLASTY N/A 08/31/2021   Procedure: KYPHOPLASTY THORACIC ELEVEN, THORACIC  TWELVE;  Surgeon: Bethann Goo, DO;  Location: [facility] OR;  Service: Neurosurgery;  Laterality: N/A;   LAPAROSCOPIC PARTIAL COLECTOMY  06/26/2017   LUMBAR PERCUTANEOUS PEDICLE SCREW 4 LEVEL N/A 12/29/2018   Procedure: Lumbar Percutaneous Pedicle Screw Placement Lumbar two-Sacral one;  Surgeon: Maeola Harman, MD;  Location: [facility] OR;  Service: Neurosurgery;  Laterality: N/A;   MOHS SURGERY  2019   nose   PARTIAL COLECTOMY  06/2017   for diverticulitis   REMOVAL OF BILATERAL TISSUE EXPANDERS WITH PLACEMENT OF BILATERAL BREAST IMPLANTS  2004   THIGH LIFT  1994   TOE FUSION Right    great toe   TONSILLECTOMY     UMBILICAL HERNIA REPAIR     with tummy tuck   UMBILICAL HERNIA REPAIR  2002    Social History: Social History   Socioeconomic History   Marital status: Married    Spouse name: Not on file   Number of children: Not on file   Years of education: Not on file   Highest education level: Not on file  Occupational History   Not on file  Tobacco Use   Smoking status: Never   Smokeless tobacco: Never  Vaping Use   Vaping status: Never Used  Substance and Sexual Activity   Alcohol use: Yes    Comment: occasional   Drug use: Never   Sexual activity: Not on file  Other Topics Concern   Not on file  Social History Narrative   Not on file   Social Drivers of Health   Financial Resource Strain: Low Risk  (07/24/2021)   Overall Financial Resource Strain (CARDIA)    Difficulty of Paying Living Expenses: Not hard at all  Food Insecurity: No Food Insecurity (03/04/2022)   [facility] from [facility]   Hunger Vital Sign  Transportation Needs: No Transportation Needs (07/24/2021)   PRAPARE - Administrator, Civil Service (Medical): No    Lack of Transportation (Non-Medical): No  Physical Activity: Not on file  Stress: Not on file  Social Connections: Socially Integrated (07/24/2021)   Social Connection and Isolation Panel [NHANES]    Frequency of  Communication with Friends and Family: More than three times a week    Frequency of Social Gatherings with Friends and Family: More than three times a week    Attends Religious Services: 1 to 4 times per year  Active Member of Clubs or Organizations: No    Attends Banker Meetings: 1 to 4 times per year    Marital Status: Married  Catering manager Violence: Not on file    Family History: Family History  Problem Relation Age of Onset   Hypertension Mother    COPD Mother    Hypertension Father    Heart disease Father     Current Medications:  Current Outpatient Medications:    albuterol (VENTOLIN HFA) 108 (90 Base) MCG/ACT inhaler, Inhale 2 puffs into the lungs every 6 (six) hours as needed for wheezing or shortness of breath., Disp: , Rfl:    ammonium lactate (AMLACTIN) 12 % cream, SMARTSIG:2-4 Gram(s) Topical Twice Daily, Disp: , Rfl:    ANORO ELLIPTA 62.5-25 MCG/ACT AEPB, Inhale 1 puff into the lungs daily., Disp: , Rfl:    aspirin EC 81 MG tablet, Take 81 mg by mouth daily. Swallow whole., Disp: , Rfl:    Baclofen 5 MG TABS, Take 1 tablet by mouth 3 (three) times daily as needed., Disp: , Rfl:    benzonatate (TESSALON) 100 MG capsule, Take 100 mg by mouth 3 (three) times daily as needed for cough., Disp: , Rfl:    Biotin 1000 MCG tablet, Take 1,000 mcg by mouth daily., Disp: , Rfl:    calcitonin, salmon, (MIACALCIN/FORTICAL) 200 UNIT/ACT nasal spray, Place 1 spray into alternate nostrils daily., Disp: , Rfl:    Calcium Carb-Cholecalciferol 600-800 MG-UNIT TABS, Take 1 tablet by mouth daily., Disp: , Rfl:    daratumumab-hyaluronidase-fihj (DARZALEX FASPRO) 1800-30000 MG-UT/15ML SOLN, Inject 1,800 mg into the skin once a week., Disp: , Rfl:    dexamethasone (DECADRON) 2 MG tablet, Take 5 tablets (10 mg total) by mouth every 30 (thirty) days., Disp: 20 tablet, Rfl: 4   dextromethorphan-guaiFENesin (MUCINEX DM) 30-600 MG 12hr tablet, Take 1 tablet by mouth daily. , Disp:  , Rfl:    estrogen, conjugated,-medroxyprogesterone (PREMPRO) 0.45-1.5 MG tablet, Take 1 tablet by mouth daily., Disp: , Rfl:    feeding supplement (ENSURE ENLIVE / ENSURE PLUS) LIQD, Take 237 mLs by mouth 2 (two) times daily between meals. (Patient taking differently: Take 237 mLs by mouth daily.), Disp: 237 mL, Rfl: 12   fidaxomicin (DIFICID) 200 MG TABS tablet, Take 1 tablet (200 mg total) by mouth 2 (two) times daily., Disp: 20 tablet, Rfl: 0   fluticasone (FLONASE) 50 MCG/ACT nasal spray, Place 1 spray into both nostrils daily., Disp: , Rfl:    furosemide (LASIX) 20 MG tablet, TAKE 1 TABLET BY MOUTH EVERY DAY AS NEEDED, Disp: 90 tablet, Rfl: 3   hydrochlorothiazide (HYDRODIURIL) 25 MG tablet, Take 25 mg by mouth daily as needed (swelling)., Disp: , Rfl:    HYDROcodone bit-homatropine (HYCODAN) 5-1.5 MG/5ML syrup, Take 5 mLs by mouth every 6 (six) hours as needed for cough., Disp: 473 mL, Rfl: 0   Lactulose 20 GM/30ML SOLN, Take 30 ml by mouth every 3 hours until bowel movement is had, then take 30 ml daily (Patient taking differently: Take 20 g by mouth daily as needed (constipation).), Disp: 946 mL, Rfl: 3   latanoprost (XALATAN) 0.005 % ophthalmic solution, Place 1 drop into both eyes at bedtime. , Disp: , Rfl:    lenalidomide (REVLIMID) 10 MG capsule, Take 1 capsule (10 mg total) by mouth daily. 21 days on, 7 days off, Disp: 21 capsule, Rfl: 0   linaclotide (LINZESS) 145 MCG CAPS capsule, Take 145 mcg by mouth daily as needed (constipation)., Disp: ,  Rfl:    lisinopril (ZESTRIL) 20 MG tablet, Take 20 mg by mouth daily., Disp: , Rfl:    loratadine (CLARITIN) 10 MG tablet, Take 10 mg by mouth daily., Disp: , Rfl:    loteprednol (LOTEMAX) 0.5 % ophthalmic suspension, Place into the left eye., Disp: , Rfl:    methocarbamol (ROBAXIN) 500 MG tablet, Take 1 tablet (500 mg total) by mouth every 6 (six) hours as needed for muscle spasms., Disp: 40 tablet, Rfl: 3   omeprazole (PRILOSEC) 20 MG capsule,  Take 20 mg by mouth daily., Disp: , Rfl:    ondansetron (ZOFRAN ODT) 4 MG disintegrating tablet, Take 1 tablet (4 mg total) by mouth every 4 (four) hours as needed for nausea or vomiting., Disp: 60 tablet, Rfl: 3   Oxycodone HCl 10 MG TABS, Take 1 tablet (10 mg total) by mouth every 6 (six) hours as needed., Disp: 120 tablet, Rfl: 0   pantoprazole (PROTONIX) 40 MG tablet, Take 40 mg by mouth daily., Disp: , Rfl:    predniSONE (DELTASONE) 10 MG tablet, TAKE 6 TABLETS ON DAY 1 AS DIRECTED ON PACKAGE AND DECREASE BY 1 TAB EACH DAY FOR A TOTAL OF 6 DAYS, Disp: , Rfl:    sucralfate (CARAFATE) 1 g tablet, Take 1 g by mouth 4 (four) times daily., Disp: , Rfl:    SUMAtriptan (IMITREX) 100 MG tablet, Take 100 mg by mouth every 2 (two) hours as needed for migraine. May repeat in 2 hours if headache persists or recurs., Disp: , Rfl:    tiZANidine (ZANAFLEX) 2 MG tablet, Take 2 mg by mouth every 6 (six) hours as needed., Disp: , Rfl:    UNABLE TO FIND, Place 0.5 g into both eyes 3 (three) times daily. Med Name: Loteman (otepredol), Disp: , Rfl:    Allergies: Allergies  Allergen Reactions   Tobramycin-Dexamethasone Other (See Comments)    Itching and swelling   Ethambutol Other (See Comments)    Fever   Neomycin-Polymyxin-Dexameth Itching and Swelling   Amikacin Other (See Comments)    Eosinophilia with chills and aching when given with cefoxitin   Biaxin [Clarithromycin] Itching   Cefoxitin Other (See Comments)    Eosinophlia when given with amikacin   Sulfamethoxazole-Trimethoprim Itching   Vancomycin     "Got red splotches on skin after several doses"   Bacitracin-Polymyxin B Rash    REVIEW OF SYSTEMS:   Review of Systems  Constitutional:  Positive for fatigue. Negative for chills and fever.  HENT:   Negative for lump/mass, mouth sores, nosebleeds and sore throat.   Eyes:  Negative for eye problems.  Respiratory:  Positive for shortness of breath.   Cardiovascular:  Negative for chest pain,  leg swelling and palpitations.  Gastrointestinal:  Positive for diarrhea and nausea. Negative for abdominal pain and vomiting.  Genitourinary:  Negative for bladder incontinence, difficulty urinating, dysuria, frequency, hematuria and nocturia.   Musculoskeletal:  Positive for back pain (7/10 severity). Negative for arthralgias, flank pain, myalgias and neck pain.  Skin:  Negative for itching and rash.  Neurological:  Positive for headaches. Negative for dizziness and numbness.  Hematological:  Does not bruise/bleed easily.  Psychiatric/Behavioral:  Positive for depression and sleep disturbance. Negative for suicidal ideas. The patient is nervous/anxious.   All other systems reviewed and are negative.    VITALS:   Blood pressure 119/72, pulse 60, temperature 98.2 F (36.8 C), temperature source Oral, resp. rate 18, weight 100 lb 8 oz (45.6 kg), SpO2 93%.  Wt  Readings from Last 3 Encounters:  07/14/23 100 lb 8 oz (45.6 kg)  03/18/23 104 lb 9.6 oz (47.4 kg)  02/17/23 105 lb 11.2 oz (47.9 kg)    Body mass index is 18.38 kg/m.  Performance status (ECOG): 1 - Symptomatic but completely ambulatory  PHYSICAL EXAM:   Physical Exam Vitals and nursing note reviewed. Exam conducted with a chaperone present.  Constitutional:      Appearance: Normal appearance.  Cardiovascular:     Rate and Rhythm: Normal rate and regular rhythm.     Pulses: Normal pulses.     Heart sounds: Normal heart sounds.  Pulmonary:     Effort: Pulmonary effort is normal.     Breath sounds: Normal breath sounds.  Abdominal:     Palpations: Abdomen is soft. There is no hepatomegaly, splenomegaly or mass.     Tenderness: There is no abdominal tenderness.  Musculoskeletal:     Right lower leg: No edema.     Left lower leg: No edema.  Lymphadenopathy:     Cervical: No cervical adenopathy.     Right cervical: No superficial, deep or posterior cervical adenopathy.    Left cervical: No superficial, deep or posterior  cervical adenopathy.     Upper Body:     Right upper body: No supraclavicular or axillary adenopathy.     Left upper body: No supraclavicular or axillary adenopathy.  Neurological:     General: No focal deficit present.     Mental Status: She is alert and oriented to person, place, and time.  Psychiatric:        Mood and Affect: Mood normal.        Behavior: Behavior normal.     LABS:      Latest Ref Rng & Units 07/14/2023   10:57 AM 06/10/2023   11:54 AM 04/15/2023   10:52 AM  CBC  WBC 4.0 - 10.5 K/uL 3.9  6.7  6.1   Hemoglobin 12.0 - 15.0 g/dL 81.0  17.5  10.2   Hematocrit 36.0 - 46.0 % 36.9  36.3  36.4   Platelets 150 - 400 K/uL 206  276  275       Latest Ref Rng & Units 06/10/2023   11:54 AM 04/15/2023   10:52 AM 03/18/2023   12:37 PM  CMP  Glucose 70 - 99 mg/dL 89  585  277   BUN 8 - 23 mg/dL 9  17  16    Creatinine 0.44 - 1.00 mg/dL 8.24  2.35  3.61   Sodium 135 - 145 mmol/L 132  133  133   Potassium 3.5 - 5.1 mmol/L 3.4  3.3  3.7   Chloride 98 - 111 mmol/L 97  98  98   CO2 22 - 32 mmol/L 25  26  26    Calcium 8.9 - 10.3 mg/dL 9.3  8.6  9.1   Total Protein 6.5 - 8.1 g/dL 8.0  6.7  7.0   Total Bilirubin <1.2 mg/dL 0.9  0.3  0.6   Alkaline Phos 38 - 126 U/L 58  58  66   AST 15 - 41 U/L 35  22  20   ALT 0 - 44 U/L 36  27  22      No results found for: "CEA1", "CEA" / No results found for: "CEA1", "CEA" No results found for: "PSA1" No results found for: "WER154" No results found for: "CAN125"  Lab Results  Component Value Date   TOTALPROTELP 7.5 06/10/2023  TOTALPROTELP 7.6 06/10/2023   ALBUMINELP 4.5 (H) 06/10/2023   A1GS 0.4 06/10/2023   A2GS 0.7 06/10/2023   BETS 1.1 06/10/2023   GAMS 0.8 06/10/2023   MSPIKE 0.6 (H) 06/10/2023   SPEI Comment 06/10/2023   No results found for: "TIBC", "FERRITIN", "IRONPCTSAT" Lab Results  Component Value Date   LDH 122 04/15/2023   LDH 162 04/17/2022   LDH 140 03/15/2022     STUDIES:   No results found.

## 2023-07-14 NOTE — Progress Notes (Signed)
Patient presents today for Zometa 4mg  IV per provider's order. Vital signs stable and patient voiced no new complaints at this time.  Peripheral IV started with good blood return pre and post infusion. Patient's Calcium noted to be 9.7 today. Patient reports taking Calcium/Vit D supplements as directed. Patient denies any tooth or jaw pain and no recent or future dental appointments noted at this time.  Discharged from clinic via wheelchair in stable condition. Alert and oriented x 3. F/U with [facility] as scheduled.

## 2023-07-14 NOTE — Progress Notes (Deleted)
Patient has been examined by Dr. Katragadda. Vital signs and labs have been reviewed by MD - ANC, Creatinine, LFTs, hemoglobin, and platelets are within treatment parameters per M.D. - pt may proceed with treatment.  Primary RN and pharmacy notified.  

## 2023-07-15 LAB — KAPPA/LAMBDA LIGHT CHAINS
Kappa free light chain: 30.9 mg/L — ABNORMAL HIGH (ref 3.3–19.4)
Kappa, lambda light chain ratio: 1.62 (ref 0.26–1.65)
Lambda free light chains: 19.1 mg/L (ref 5.7–26.3)

## 2023-07-20 ENCOUNTER — Telehealth: Payer: Self-pay

## 2023-07-20 LAB — PROTEIN ELECTROPHORESIS, SERUM
A/G Ratio: 2 — ABNORMAL HIGH (ref 0.7–1.7)
Albumin ELP: 4.9 g/dL — ABNORMAL HIGH (ref 2.9–4.4)
Alpha-1-Globulin: 0.3 g/dL (ref 0.0–0.4)
Alpha-2-Globulin: 0.7 g/dL (ref 0.4–1.0)
Beta Globulin: 1.1 g/dL (ref 0.7–1.3)
Gamma Globulin: 0.4 g/dL (ref 0.4–1.8)
Globulin, Total: 2.4 g/dL (ref 2.2–3.9)
Total Protein ELP: 7.3 g/dL (ref 6.0–8.5)

## 2023-07-20 NOTE — Telephone Encounter (Addendum)
 Oral Oncology Patient Advocate Encounter  Was successful in securing patient a $12,000.00 grant from [facility] to provide copayment coverage for Lenalidomide .  This will keep the out of pocket expense at $0.     [employer] ID: [id number]   The billing information is as follows and has been shared with [facility].    RxBin: [id number] PCN: [id number] Member ID: [id number] Group ID: [id number] Dates of Eligibility: 06/26/23 through 06/24/24  Fund:  Multiple Myeloma - Medicare Access   Morene Potters, CPhT Oncology Pharmacy Patient Advocate  [facility]  [phone] (phone) [phone] (fax)

## 2023-07-23 LAB — IMMUNOFIXATION ELECTROPHORESIS
IgA: 308 mg/dL (ref 64–422)
IgG (Immunoglobin G), Serum: 744 mg/dL (ref 586–1602)
IgM (Immunoglobulin M), Srm: 102 mg/dL (ref 26–217)
Total Protein ELP: 7.3 g/dL (ref 6.0–8.5)

## 2023-07-25 ENCOUNTER — Other Ambulatory Visit: Payer: Self-pay

## 2023-07-25 MED ORDER — LENALIDOMIDE 10 MG PO CAPS: 10.0000 mg | ORAL_CAPSULE | Freq: Every day | ORAL | 0 refills | Status: DC

## 2023-07-25 NOTE — Telephone Encounter (Signed)
 Chart reviewed. Revlimid refilled per last office note with Dr. Ellin Saba.

## 2023-07-28 ENCOUNTER — Inpatient Hospital Stay: Payer: Medicare Other | Admitting: Dietician

## 2023-07-28 ENCOUNTER — Telehealth: Payer: Self-pay | Admitting: Dietician

## 2023-07-28 NOTE — Telephone Encounter (Signed)
 Brief Nutrition Note  Attempted to contact patient via telephone for nutrition follow-up. Left VM with contact information.

## 2023-08-26 ENCOUNTER — Other Ambulatory Visit: Payer: Self-pay

## 2023-08-26 MED ORDER — LENALIDOMIDE 10 MG PO CAPS: 10.0000 mg | ORAL_CAPSULE | Freq: Every day | ORAL | 0 refills | Status: DC

## 2023-08-26 NOTE — Telephone Encounter (Signed)
Chart reviewed. Revlimid refilled per last office note with Dr. Ellin Saba.

## 2023-09-23 ENCOUNTER — Other Ambulatory Visit: Payer: Self-pay

## 2023-09-23 MED ORDER — LENALIDOMIDE 10 MG PO CAPS: 10.0000 mg | ORAL_CAPSULE | Freq: Every day | ORAL | 0 refills | Status: DC

## 2023-09-23 NOTE — Telephone Encounter (Signed)
 Chart reviewed. Revlimid refilled per last office note with Dr. Ellin Saba.

## 2023-09-29 ENCOUNTER — Telehealth: Payer: Self-pay | Admitting: *Deleted

## 2023-09-29 NOTE — Telephone Encounter (Signed)
 Patient called stating that she feels like she has c-diff again, as she is having loose stools.  Is on chronic antibiotic therapy.  Per Dr. Ellin Saba, advised that she should follow up with her gastroenterologist at this point.  Verbalized understanding.

## 2023-10-02 ENCOUNTER — Inpatient Hospital Stay: Payer: Medicare Other | Attending: Hematology

## 2023-10-02 DIAGNOSIS — Z7982 Long term (current) use of aspirin: Secondary | ICD-10-CM | POA: Insufficient documentation

## 2023-10-02 DIAGNOSIS — D72819 Decreased white blood cell count, unspecified: Secondary | ICD-10-CM | POA: Diagnosis not present

## 2023-10-02 DIAGNOSIS — Z800 Family history of malignant neoplasm of digestive organs: Secondary | ICD-10-CM | POA: Diagnosis not present

## 2023-10-02 DIAGNOSIS — C9000 Multiple myeloma not having achieved remission: Secondary | ICD-10-CM | POA: Diagnosis present

## 2023-10-02 DIAGNOSIS — Z85828 Personal history of other malignant neoplasm of skin: Secondary | ICD-10-CM | POA: Insufficient documentation

## 2023-10-02 DIAGNOSIS — A0471 Enterocolitis due to Clostridium difficile, recurrent: Secondary | ICD-10-CM | POA: Diagnosis not present

## 2023-10-02 DIAGNOSIS — R531 Weakness: Secondary | ICD-10-CM | POA: Diagnosis not present

## 2023-10-02 DIAGNOSIS — E876 Hypokalemia: Secondary | ICD-10-CM | POA: Diagnosis not present

## 2023-10-02 DIAGNOSIS — C9001 Multiple myeloma in remission: Secondary | ICD-10-CM

## 2023-10-02 LAB — CBC WITH DIFFERENTIAL/PLATELET
Abs Immature Granulocytes: 0.01 10*3/uL (ref 0.00–0.07)
Basophils Absolute: 0.1 10*3/uL (ref 0.0–0.1)
Basophils Relative: 1 %
Eosinophils Absolute: 0.1 10*3/uL (ref 0.0–0.5)
Eosinophils Relative: 3 %
HCT: 31.1 % — ABNORMAL LOW (ref 36.0–46.0)
Hemoglobin: 10 g/dL — ABNORMAL LOW (ref 12.0–15.0)
Immature Granulocytes: 0 %
Lymphocytes Relative: 26 %
Lymphs Abs: 1 10*3/uL (ref 0.7–4.0)
MCH: 31.7 pg (ref 26.0–34.0)
MCHC: 32.2 g/dL (ref 30.0–36.0)
MCV: 98.7 fL (ref 80.0–100.0)
Monocytes Absolute: 0.5 10*3/uL (ref 0.1–1.0)
Monocytes Relative: 13 %
Neutro Abs: 2.2 10*3/uL (ref 1.7–7.7)
Neutrophils Relative %: 57 %
Platelets: 200 10*3/uL (ref 150–400)
RBC: 3.15 MIL/uL — ABNORMAL LOW (ref 3.87–5.11)
RDW: 16.2 % — ABNORMAL HIGH (ref 11.5–15.5)
WBC: 3.9 10*3/uL — ABNORMAL LOW (ref 4.0–10.5)
nRBC: 0 % (ref 0.0–0.2)

## 2023-10-02 LAB — COMPREHENSIVE METABOLIC PANEL
ALT: 37 U/L (ref 0–44)
AST: 57 U/L — ABNORMAL HIGH (ref 15–41)
Albumin: 4.1 g/dL (ref 3.5–5.0)
Alkaline Phosphatase: 93 U/L (ref 38–126)
Anion gap: 12 (ref 5–15)
BUN: 7 mg/dL — ABNORMAL LOW (ref 8–23)
CO2: 29 mmol/L (ref 22–32)
Calcium: 9.2 mg/dL (ref 8.9–10.3)
Chloride: 88 mmol/L — ABNORMAL LOW (ref 98–111)
Creatinine, Ser: 1 mg/dL (ref 0.44–1.00)
GFR, Estimated: 59 mL/min — ABNORMAL LOW (ref >60)
Glucose, Bld: 98 mg/dL (ref 70–99)
Potassium: 2.9 mmol/L — ABNORMAL LOW (ref 3.5–5.1)
Sodium: 129 mmol/L — ABNORMAL LOW (ref 135–145)
Total Bilirubin: 0.6 mg/dL (ref 0.0–1.2)
Total Protein: 7.2 g/dL (ref 6.5–8.1)

## 2023-10-02 LAB — MAGNESIUM: Magnesium: 2.1 mg/dL (ref 1.7–2.4)

## 2023-10-03 LAB — KAPPA/LAMBDA LIGHT CHAINS
Kappa free light chain: 34.2 mg/L — ABNORMAL HIGH (ref 3.3–19.4)
Kappa, lambda light chain ratio: 1.52 (ref 0.26–1.65)
Lambda free light chains: 22.5 mg/L (ref 5.7–26.3)

## 2023-10-06 LAB — IMMUNOFIXATION ELECTROPHORESIS
IgA: 331 mg/dL (ref 64–422)
IgG (Immunoglobin G), Serum: 809 mg/dL (ref 586–1602)
IgM (Immunoglobulin M), Srm: 116 mg/dL (ref 26–217)
Total Protein ELP: 6.7 g/dL (ref 6.0–8.5)

## 2023-10-07 ENCOUNTER — Other Ambulatory Visit: Payer: Self-pay | Admitting: *Deleted

## 2023-10-07 LAB — PROTEIN ELECTROPHORESIS, SERUM
A/G Ratio: 1.5 (ref 0.7–1.7)
Albumin ELP: 4.1 g/dL (ref 2.9–4.4)
Alpha-1-Globulin: 0.3 g/dL (ref 0.0–0.4)
Alpha-2-Globulin: 0.7 g/dL (ref 0.4–1.0)
Beta Globulin: 1 g/dL (ref 0.7–1.3)
Gamma Globulin: 0.8 g/dL (ref 0.4–1.8)
Globulin, Total: 2.8 g/dL (ref 2.2–3.9)
Total Protein ELP: 6.9 g/dL (ref 6.0–8.5)

## 2023-10-07 MED ORDER — POTASSIUM CHLORIDE CRYS ER 20 MEQ PO TBCR: 20.0000 meq | EXTENDED_RELEASE_TABLET | Freq: Every day | ORAL | 3 refills | Status: DC

## 2023-10-09 ENCOUNTER — Inpatient Hospital Stay (HOSPITAL_BASED_OUTPATIENT_CLINIC_OR_DEPARTMENT_OTHER): Payer: Medicare Other | Admitting: Hematology

## 2023-10-09 ENCOUNTER — Inpatient Hospital Stay: Payer: Medicare Other

## 2023-10-09 VITALS — BP 140/73 | HR 62 | Temp 97.7°F | Resp 17 | Wt 102.0 lb

## 2023-10-09 DIAGNOSIS — C9001 Multiple myeloma in remission: Secondary | ICD-10-CM | POA: Diagnosis not present

## 2023-10-09 DIAGNOSIS — C9000 Multiple myeloma not having achieved remission: Secondary | ICD-10-CM | POA: Diagnosis not present

## 2023-10-09 NOTE — Patient Instructions (Addendum)
 [facility] at [facility] Discharge Instructions   You were seen and examined today by Dr. Ellin Saba.  He reviewed the results of your lab work which are normal/stable.   We will see you back in 12 weeks. We will repeat lab work one week prior to this visit.   Return as scheduled.    Thank you for choosing [facility] at [facility] to provide your oncology and hematology care.  To afford each patient quality time with our provider, please arrive at least 15 minutes before your scheduled appointment time.   If you have a lab appointment with the Cancer Center please come in thru the Main Entrance and check in at the main information desk.  You need to re-schedule your appointment should you arrive 10 or more minutes late.  We strive to give you quality time with our providers, and arriving late affects you and other patients whose appointments are after yours.  Also, if you no show three or more times for appointments you may be dismissed from the clinic at the providers discretion.     Again, thank you for choosing [facility]  Our hope is that these requests will decrease the amount of time that you wait before being seen by our physicians.       _____________________________________________________________  Should you have questions after your visit to [facility], please contact our office at [phone] and follow the prompts.  Our office hours are 8:00 a.m. and 4:30 p.m. Monday - Friday.  Please note that voicemails left after 4:00 p.m. may not be returned until the following business day.  We are closed weekends and major holidays.  You do have access to a nurse 24-7, just call the main number to the clinic [phone] and do not press any options, hold on the line and a nurse will answer the phone.    For prescription refill requests, have your pharmacy contact our office and allow 72 hours.    Due to Covid,  you will need to wear a mask upon entering the hospital. If you do not have a mask, a mask will be given to you at the Main Entrance upon arrival. For doctor visits, patients may have 1 support person age 71 or older with them. For treatment visits, patients can not have anyone with them due to social distancing guidelines and our immunocompromised population.

## 2023-10-09 NOTE — Progress Notes (Signed)
Patient is taking Revlimid as prescribed.  She has not missed any doses and reports no side effects at this time.   

## 2023-10-09 NOTE — Progress Notes (Signed)
 [facility] [address], [place] [address]    Clinic Day:  10/09/23   Referring physician: Maximiano Coss,*  Patient Care Team: Maximiano Coss, MD as PCP - General (Family Medicine) Doreatha Massed, MD as Medical Oncologist (Medical Oncology)   ASSESSMENT & PLAN:   Assessment: 1. 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 Jacklynn Ganong - She reported discomfort in the left posterior back for the last 1 month.  She was seen by Dr. Leary Roca and x-rays showed abnormality.  A CT scan of the chest was done on 05/24/2021. - CT chest report reviewed by me from 05/24/2021 showed expansile oval-shaped lesion within the posterior aspect of the seventh rib with a nodule measuring 2.1 x 1.1 cm.  Adjacent cortex demonstrates area of thinning and destruction.  Stable emphysematous and interstitial changes within the lungs.  Stable pulmonary nodules within the right upper lobe and left upper lobe when compared to CT from September 2020. - CT of the abdomen and pelvis did not show any significant abnormalities. - She denies any fevers, night sweats or weight loss in the last 6 months.  She had history of basal cell carcinoma on the nose removed by Mohs surgery. - She also reported history of osteoporosis and broke left upper rib 1 year ago after sneezing. -Reviewed PET scan from 06/14/2021.  Mild hypermetabolism involving left aspect of the L5 vertebral body and also pedicle region surrounding the screw.  SUV 4.2.  There appears to be a lytic process on the CT scan.  The lytic left posterior seventh rib lesion is mildly hypermetabolic with SUV 2.98.  No other definite lytic hypermetabolic bone lesions seen. - We have reviewed left seventh rib bone biopsy from 06/25/2021 consistent with plasma cell neoplasm.  Cells are positive for CD138, CD56 and a kappa restricted.  - Labs on 06/13/2021 M spike 1.7 g.   Immunofixation IgA kappa.  Free light chain shows kappa light chain 71, ratio 8.22.  LDH and beta-2 microglobulin was normal. - Bone marrow biopsy on 07/04/2021 consistent with plasma cell neoplasm with 26% plasma cells on aspirate with atypical features and 50 to 60% on core biopsy.  Chromosome analysis was normal.  Multiple myeloma FISH panel shows loss of NF1/chromosome 17/17 q.  Gain of CCN D1 or chromosome 11/11 q.  Gain of chromosome 4/4P.  Loss of material from the long-arm of chromosome 13.  These are all standard risk features. - 24-hour urine total protein to 40 mg.  Urine immunofixation was negative.  Urine kappa light chains were 4.13. - 8 cycles of Dara RVD from 07/26/2021 through 03/01/2022 - T11 and T12 vertebroplasty by Dr. Jake Samples on 08/31/2021. - She has decided against bone marrow transplant after the advice of Dr. Valentina Lucks. - Maintenance daratumumab discontinued on 08/29/2022.  Maintenance Revlimid 10 mg 3 weeks on/1 week off ongoing.   2. Social/family history: - She is married and lives at home with her husband.  She has not worked but had help her husband and his work.  She is a non-smoker. - Maternal grandmother had colon cancer.  Mother had a squamous cell carcinoma of the skin.    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 - She fell on hardwood floor on 09/14/2023 and cracked ribs on the left side due to her balance being off.  For the last 2  weeks she reports swelling of her right ankle.  Denies any pain in the right ankle or decreased range of motion. - She is also feeling weak as she has been on brat diet for a long time for diarrhea. - Reviewed labs from 10/02/2023: M spike is not detected.  FLC ratio is normal at 1.52.  Immunofixation was unremarkable.  Mild leukopenia with normal ANC stable.  Potassium and sodium were low at 2.9 and 129 respectively. - She will start back on potassium 20 mill equivalents daily. -  Continue Revlimid 10 mg 3 weeks on/1 week off.  RTC 12 weeks for follow-up with repeat myeloma labs.  2.  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 - Dr. Valentina Lucks at [facility] did not recommend any further antibiotics.  3.  Thromboprophylaxis: - She will continue aspirin 81 mg daily.  4.  Myeloma bone disease: - Calcium is 9.2.  She is receiving Zometa every 12 weeks.  We are holding today's dose as she is not feeling well.  5.  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  She has used vancomycin almost 7 times.  She was evaluated at [facility] and planning to undergo fecal microbial transplant in the first week of May.    No orders of the defined types were placed in this encounter.    Alben Deeds Teague,acting as a Neurosurgeon for Doreatha Massed, MD.,have documented all relevant documentation on the behalf of Doreatha Massed, MD,as directed by  Doreatha Massed, MD while in the presence of Doreatha Massed, MD.  I, Doreatha Massed MD, have reviewed the above documentation for accuracy and completeness, and I agree with the above.     Doreatha Massed, MD   3/27/20251:39 PM  CHIEF COMPLAINT:   Diagnosis: multiple myeloma    Cancer Staging  Multiple myeloma (HCC) Staging form: Multiple Myeloma, AJCC 6th Edition - Clinical stage from 07/17/2021: Stage IA - Unsigned    Prior Therapy: DaraVRd (Daratumumab SQ) q21d x 6 Cycles (Induction/Consolidation)   Current Therapy:  Revlimid maintenance    HISTORY OF PRESENT ILLNESS:   Oncology History  Multiple myeloma (HCC)  07/17/2021 Initial Diagnosis   Multiple myeloma (HCC)   07/26/2021 - 03/15/2022 Chemotherapy   Patient is on Treatment Plan : MYELOMA NEWLY DIAGNOSED TRANSPLANT CANDIDATE DaraVRd (Daratumumab SQ) q21d x 6 Cycles (Induction/Consolidation)     04/17/2022 - 08/29/2022 Chemotherapy   Patient is  on Treatment Plan : MYELOMA NEWLY DIAGNOSED TRANSPLANT CANDIDATE Daratumumab SQ + Lenalidomide q28d (Maintenance)        INTERVAL HISTORY:   Griselle is a 73 y.o. female seen for follow-up of multiple myeloma.  She was last seen by me on 07/14/23.  Today, she states that she is doing well overall. Her appetite level is at 50%. Her energy level is at 0%.  She is accompanied by her husband.  Climmie notes swelling in the right ankle that has worsened over the past 2 weeks with associated tightness to the area. She denies any pain or decreased range of motion in the right ankle. Swelling has not improved with foot elevation and has slightly improved with lasix. She had a fall on 09/14/23, hitting her face and head on the edge of the table, and resulted in a left cracked rib. She denies any ankle injury during her fall. Chardonnay states she has arthritis in both ankles, which she originally attributed to ankle tightness. Her left ankle will slightly  swell as the day progresses due to sitting. She has an appointment with orthopedics next week and will have her right ankle evaluated at that time.   Alizay also notes generalized weakness and feeling off balanced, which she attributes to her BRAT diet due to recurrent C. Diff infection. She recently received potassium supplements and will start them soon. Mariesa reports constant diarrhea and is on her 7th treatment of vancomycin without any improvement in symptoms. She has been accepted for fecal transplant at [facility] with an appointment to do so on 11/17/23. She has a CT abdomen scheduled at [facility] to determine if there is a co diagnosis with the recurrent C. diff.   She is tolerating Revlimid well, though she notes an upset stomach when first starting Revlimid after her off week.   She has multiple spots of ecchymosis on the lower extremities worse on the right than the left that has worsened over the past month. She is not taking prednisone.   PAST  MEDICAL HISTORY:   Past Medical History: Past Medical History:  Diagnosis Date   Arthritis    osteoarthritis   Back pain    Bronchiectasis (HCC)    Cancer (HCC)    basal cell nose   Chronic cough    Complication of anesthesia    Dyspnea    Gastrointestinal symptoms    GERD (gastroesophageal reflux disease)    Glaucoma    History of hiatal hernia    Hyperlipemia    Hypertension    Multiple myeloma (HCC)    Osteopenia    Pneumonia    PONV (postoperative nausea and vomiting)    "only one time"   Spondylisthesis     Surgical History: Past Surgical History:  Procedure Laterality Date   ABDOMINAL EXPOSURE N/A 12/29/2018   Procedure: ABDOMINAL EXPOSURE;  Surgeon: Chuck Hint, MD;  Location: [facility] OR;  Service: Vascular;  Laterality: N/A;   ABDOMINOPLASTY  2002   ABDOMINOPLASTY  1970's   ANKLE SURGERY Left 10/2015   ANTERIOR LATERAL LUMBAR FUSION WITH PERCUTANEOUS SCREW 3 LEVEL Left 12/29/2018   Procedure: Lumbar Two to Lumbar Five Anterolateral lumbar interbody fusion;  Surgeon: Maeola Harman, MD;  Location: [facility] OR;  Service: Neurosurgery;  Laterality: Left;  anterolateral   ANTERIOR LUMBAR FUSION N/A 12/29/2018   Procedure: Lumbar Five Sacral One Anterior lumbar interbody fusion;  Surgeon: Maeola Harman, MD;  Location: [facility] OR;  Service: Neurosurgery;  Laterality: N/A;  anterior approach   BASAL CELL CARCINOMA EXCISION  06/2018   nose   BLEPHAROPLASTY Bilateral 1999   BREAST ENHANCEMENT SURGERY  1970's   COLONOSCOPY     CYSTOURETHROSCOPY  2018   Doble J ureteal stents   DECOMPRESSION CORE HIP Left 2014   FACIAL COSMETIC SURGERY  2004   FLUOROSCOPY GUIDANCE     HIP ARTHROPLASTY Left    INCISIONAL HERNIA REPAIR N/A 10/08/2019   Procedure: OPEN INCISIONAL HERNIA REPAIR WITH MESH;  Surgeon: Darnell Level, MD;  Location: [facility] ORS;  Service: General;  Laterality: N/A;   JOINT REPLACEMENT Left    Hip; 05/2014   KYPHOPLASTY N/A 08/31/2021   Procedure: KYPHOPLASTY THORACIC  ELEVEN, THORACIC TWELVE;  Surgeon: Bethann Goo, DO;  Location: [facility] OR;  Service: Neurosurgery;  Laterality: N/A;   LAPAROSCOPIC PARTIAL COLECTOMY  06/26/2017   LUMBAR PERCUTANEOUS PEDICLE SCREW 4 LEVEL N/A 12/29/2018   Procedure: Lumbar Percutaneous Pedicle Screw Placement Lumbar two-Sacral one;  Surgeon: Maeola Harman, MD;  Location: [facility] OR;  Service: Neurosurgery;  Laterality: N/A;  MOHS SURGERY  2019   nose   PARTIAL COLECTOMY  06/2017   for diverticulitis   REMOVAL OF BILATERAL TISSUE EXPANDERS WITH PLACEMENT OF BILATERAL BREAST IMPLANTS  2004   THIGH LIFT  1994   TOE FUSION Right    great toe   TONSILLECTOMY     UMBILICAL HERNIA REPAIR     with tummy tuck   UMBILICAL HERNIA REPAIR  2002    Social History: Social History   Socioeconomic History   Marital status: Married    Spouse name: Not on file   Number of children: Not on file   Years of education: Not on file   Highest education level: Not on file  Occupational History   Not on file  Tobacco Use   Smoking status: Never   Smokeless tobacco: Never  Vaping Use   Vaping status: Never Used  Substance and Sexual Activity   Alcohol use: Yes    Comment: occasional   Drug use: Never   Sexual activity: Not on file  Other Topics Concern   Not on file  Social History Narrative   Not on file   Social Drivers of Health   Financial Resource Strain: Low Risk  (07/24/2021)   Overall Financial Resource Strain (CARDIA)    Difficulty of Paying Living Expenses: Not hard at all  Food Insecurity: No Food Insecurity (03/04/2022)   [facility] from [facility]   Hunger Vital Sign  Transportation Needs: No Transportation Needs (07/24/2021)   PRAPARE - Administrator, Civil Service (Medical): No    Lack of Transportation (Non-Medical): No  Physical Activity: Not on file  Stress: Not on file  Social Connections: Socially Integrated (07/24/2021)   Social Connection and Isolation Panel [NHANES]     Frequency of Communication with Friends and Family: More than three times a week    Frequency of Social Gatherings with Friends and Family: More than three times a week    Attends Religious Services: 1 to 4 times per year    Active Member of [employer] or Organizations: No    Attends Engineer, structural: 1 to 4 times per year    Marital Status: Married  Catering manager Violence: Not on file    Family History: Family History  Problem Relation Age of Onset   Hypertension Mother    COPD Mother    Hypertension Father    Heart disease Father     Current Medications:  Current Outpatient Medications:    albuterol (VENTOLIN HFA) 108 (90 Base) MCG/ACT inhaler, Inhale 2 puffs into the lungs every 6 (six) hours as needed for wheezing or shortness of breath., Disp: , Rfl:    ammonium lactate (AMLACTIN) 12 % cream, SMARTSIG:2-4 Gram(s) Topical Twice Daily, Disp: , Rfl:    ANORO ELLIPTA 62.5-25 MCG/ACT AEPB, Inhale 1 puff into the lungs daily., Disp: , Rfl:    aspirin EC 81 MG tablet, Take 81 mg by mouth daily. Swallow whole., Disp: , Rfl:    Baclofen 5 MG TABS, Take 1 tablet by mouth 3 (three) times daily as needed., Disp: , Rfl:    benzonatate (TESSALON) 100 MG capsule, Take 100 mg by mouth 3 (three) times daily as needed for cough., Disp: , Rfl:    Biotin 1000 MCG tablet, Take 1,000 mcg by mouth daily., Disp: , Rfl:    calcitonin, salmon, (MIACALCIN/FORTICAL) 200 UNIT/ACT nasal spray, Place 1 spray into alternate nostrils daily., Disp: , Rfl:    Calcium Carb-Cholecalciferol  600-800 MG-UNIT TABS, Take 1 tablet by mouth daily., Disp: , Rfl:    dextromethorphan-guaiFENesin (MUCINEX DM) 30-600 MG 12hr tablet, Take 1 tablet by mouth daily. , Disp: , Rfl:    dicyclomine (BENTYL) 10 MG capsule, Take 10 mg by mouth 2 (two) times daily., Disp: , Rfl:    estrogen, conjugated,-medroxyprogesterone (PREMPRO) 0.45-1.5 MG tablet, Take 1 tablet by mouth daily., Disp: , Rfl:    feeding supplement (ENSURE  ENLIVE / ENSURE PLUS) LIQD, Take 237 mLs by mouth 2 (two) times daily between meals. (Patient taking differently: Take 237 mLs by mouth daily.), Disp: 237 mL, Rfl: 12   fidaxomicin (DIFICID) 200 MG TABS tablet, Take 1 tablet (200 mg total) by mouth 2 (two) times daily., Disp: 20 tablet, Rfl: 0   fluticasone (FLONASE) 50 MCG/ACT nasal spray, Place 1 spray into both nostrils daily., Disp: , Rfl:    furosemide (LASIX) 20 MG tablet, TAKE 1 TABLET BY MOUTH EVERY DAY AS NEEDED, Disp: 90 tablet, Rfl: 3   hydrochlorothiazide (HYDRODIURIL) 25 MG tablet, Take 25 mg by mouth daily as needed (swelling)., Disp: , Rfl:    HYDROcodone bit-homatropine (HYCODAN) 5-1.5 MG/5ML syrup, Take 5 mLs by mouth every 6 (six) hours as needed for cough., Disp: 473 mL, Rfl: 0   Lactulose 20 GM/30ML SOLN, Take 30 ml by mouth every 3 hours until bowel movement is had, then take 30 ml daily (Patient taking differently: Take 20 g by mouth daily as needed (constipation).), Disp: 946 mL, Rfl: 3   latanoprost (XALATAN) 0.005 % ophthalmic solution, Place 1 drop into both eyes at bedtime. , Disp: , Rfl:    lenalidomide (REVLIMID) 10 MG capsule, Take 1 capsule (10 mg total) by mouth daily. 21 days on, 7 days off, Disp: 21 capsule, Rfl: 0   linaclotide (LINZESS) 145 MCG CAPS capsule, Take 145 mcg by mouth daily as needed (constipation)., Disp: , Rfl:    lisinopril (ZESTRIL) 20 MG tablet, Take 20 mg by mouth daily., Disp: , Rfl:    loratadine (CLARITIN) 10 MG tablet, Take 10 mg by mouth daily., Disp: , Rfl:    loteprednol (LOTEMAX) 0.5 % ophthalmic suspension, Place into the left eye., Disp: , Rfl:    methocarbamol (ROBAXIN) 500 MG tablet, Take 1 tablet (500 mg total) by mouth every 6 (six) hours as needed for muscle spasms., Disp: 40 tablet, Rfl: 3   omeprazole (PRILOSEC) 20 MG capsule, Take 20 mg by mouth daily., Disp: , Rfl:    ondansetron (ZOFRAN ODT) 4 MG disintegrating tablet, Take 1 tablet (4 mg total) by mouth every 4 (four) hours as  needed for nausea or vomiting., Disp: 60 tablet, Rfl: 3   Oxycodone HCl 10 MG TABS, Take 1 tablet (10 mg total) by mouth every 6 (six) hours as needed., Disp: 120 tablet, Rfl: 0   pantoprazole (PROTONIX) 40 MG tablet, Take 40 mg by mouth daily., Disp: , Rfl:    potassium chloride SA (KLOR-CON M) 20 MEQ tablet, Take 1 tablet (20 mEq total) by mouth daily., Disp: 30 tablet, Rfl: 3   sucralfate (CARAFATE) 1 g tablet, Take 1 g by mouth 4 (four) times daily., Disp: , Rfl:    SUMAtriptan (IMITREX) 100 MG tablet, Take 100 mg by mouth every 2 (two) hours as needed for migraine. May repeat in 2 hours if headache persists or recurs., Disp: , Rfl:    tiZANidine (ZANAFLEX) 2 MG tablet, Take 2 mg by mouth every 6 (six) hours as needed., Disp: , Rfl:  traMADol (ULTRAM) 50 MG tablet, Take 50 mg by mouth 4 (four) times daily as needed., Disp: , Rfl:    UNABLE TO FIND, Place 0.5 g into both eyes 3 (three) times daily. Med Name: Loteman (otepredol), Disp: , Rfl:    vancomycin (VANCOCIN) 125 MG capsule, Take 1 capsule (125 mg total) by mouth every 6 (six) hours for 7 days, THEN 1 capsule (125 mg total) every 8 (eight) hours for 14 days, THEN 1 capsule (125 mg total) every 12 (twelve) hours for 14 days, THEN 1 capsule (125 mg total) once daily for 14 days, THEN 1 capsule (125 mg total) every other day for 14 days, THEN 1 capsule (125 mg total) twice a week for 14 days, THEN 1 capsule (125 mg total) once a week for 7 days., Disp: , Rfl:    Allergies: Allergies  Allergen Reactions   Tobramycin-Dexamethasone Other (See Comments)    Itching and swelling   Ethambutol Other (See Comments)    Fever   Neomycin-Polymyxin-Dexameth Itching and Swelling   Amikacin Other (See Comments)    Eosinophilia with chills and aching when given with cefoxitin   Biaxin [Clarithromycin] Itching   Cefoxitin Other (See Comments)    Eosinophlia when given with amikacin   Sulfamethoxazole-Trimethoprim Itching   Vancomycin     "Got red  splotches on skin after several doses"   Bacitracin-Polymyxin B Rash    REVIEW OF SYSTEMS:   Review of Systems  Constitutional:  Negative for chills, fatigue and fever.  HENT:   Negative for lump/mass, mouth sores, nosebleeds, sore throat and trouble swallowing.   Eyes:  Negative for eye problems.  Respiratory:  Positive for cough and shortness of breath.   Cardiovascular:  Positive for leg swelling. Negative for chest pain and palpitations.  Gastrointestinal:  Positive for diarrhea. Negative for abdominal pain, constipation, nausea and vomiting.  Genitourinary:  Negative for bladder incontinence, difficulty urinating, dysuria, frequency, hematuria and nocturia.   Musculoskeletal:  Negative for arthralgias, back pain, flank pain, myalgias and neck pain.       +rib pain, 3/10 severity  Skin:  Negative for itching and rash.  Neurological:  Positive for extremity weakness and numbness (in feet). Negative for dizziness and headaches.       +off-balanced  Hematological:  Bruises/bleeds easily (on lower extremitities, particularly the right).  Psychiatric/Behavioral:  Positive for sleep disturbance. Negative for depression and suicidal ideas. The patient is not nervous/anxious.   All other systems reviewed and are negative.    VITALS:   Blood pressure (!) 140/73, pulse 62, temperature 97.7 F (36.5 C), temperature source Tympanic, resp. rate 17, weight 102 lb (46.3 kg), SpO2 97%.  Wt Readings from Last 3 Encounters:  10/09/23 102 lb (46.3 kg)  07/14/23 100 lb 8 oz (45.6 kg)  03/18/23 104 lb 9.6 oz (47.4 kg)    Body mass index is 18.66 kg/m.  Performance status (ECOG): 1 - Symptomatic but completely ambulatory  PHYSICAL EXAM:   Physical Exam Vitals and nursing note reviewed. Exam conducted with a chaperone present.  Constitutional:      Appearance: Normal appearance.  Cardiovascular:     Rate and Rhythm: Normal rate and regular rhythm.     Pulses: Normal pulses.     Heart  sounds: Normal heart sounds.  Pulmonary:     Effort: Pulmonary effort is normal.     Breath sounds: Normal breath sounds.  Abdominal:     Palpations: Abdomen is soft. There is no hepatomegaly, splenomegaly  or mass.     Tenderness: There is no abdominal tenderness.  Musculoskeletal:     Right lower leg: No edema.     Left lower leg: No edema.  Lymphadenopathy:     Cervical: No cervical adenopathy.     Right cervical: No superficial, deep or posterior cervical adenopathy.    Left cervical: No superficial, deep or posterior cervical adenopathy.     Upper Body:     Right upper body: No supraclavicular or axillary adenopathy.     Left upper body: No supraclavicular or axillary adenopathy.  Neurological:     General: No focal deficit present.     Mental Status: She is alert and oriented to person, place, and time.  Psychiatric:        Mood and Affect: Mood normal.        Behavior: Behavior normal.     LABS:      Latest Ref Rng & Units 10/02/2023    2:31 PM 07/14/2023   10:57 AM 06/10/2023   11:54 AM  CBC  WBC 4.0 - 10.5 K/uL 3.9  3.9  6.7   Hemoglobin 12.0 - 15.0 g/dL 40.9  81.1  91.4   Hematocrit 36.0 - 46.0 % 31.1  36.9  36.3   Platelets 150 - 400 K/uL 200  206  276       Latest Ref Rng & Units 10/02/2023    2:31 PM 07/14/2023   10:57 AM 06/10/2023   11:54 AM  CMP  Glucose 70 - 99 mg/dL 98  782  89   BUN 8 - 23 mg/dL 7  9  9    Creatinine 0.44 - 1.00 mg/dL 9.56  2.13  0.86   Sodium 135 - 145 mmol/L 129  130  132   Potassium 3.5 - 5.1 mmol/L 2.9  3.7  3.4   Chloride 98 - 111 mmol/L 88  95  97   CO2 22 - 32 mmol/L 29  23  25    Calcium 8.9 - 10.3 mg/dL 9.2  9.7  9.3   Total Protein 6.5 - 8.1 g/dL 7.2  8.0  8.0   Total Bilirubin 0.0 - 1.2 mg/dL 0.6  1.0  0.9   Alkaline Phos 38 - 126 U/L 93  57  58   AST 15 - 41 U/L 57  64  35   ALT 0 - 44 U/L 37  51  36      No results found for: "CEA1", "CEA" / No results found for: "CEA1", "CEA" No results found for: "PSA1" No  results found for: "CAN199" No results found for: "CAN125"  Lab Results  Component Value Date   TOTALPROTELP 6.9 10/02/2023   ALBUMINELP 4.1 10/02/2023   A1GS 0.3 10/02/2023   A2GS 0.7 10/02/2023   BETS 1.0 10/02/2023   GAMS 0.8 10/02/2023   MSPIKE Not Observed 10/02/2023   SPEI Comment 10/02/2023   No results found for: "TIBC", "FERRITIN", "IRONPCTSAT" Lab Results  Component Value Date   LDH 122 04/15/2023   LDH 162 04/17/2022   LDH 140 03/15/2022     STUDIES:   No results found.

## 2023-10-09 NOTE — Progress Notes (Signed)
 No zometa today per patient request.

## 2023-10-20 ENCOUNTER — Other Ambulatory Visit: Payer: Self-pay

## 2023-10-20 MED ORDER — LENALIDOMIDE 10 MG PO CAPS: 10.0000 mg | ORAL_CAPSULE | Freq: Every day | ORAL | 0 refills | Status: DC

## 2023-10-20 NOTE — Telephone Encounter (Signed)
 Chart reviewed. Revlimid refilled per last office note with Dr. Ellin Saba.

## 2023-10-23 ENCOUNTER — Other Ambulatory Visit: Payer: Self-pay | Admitting: *Deleted

## 2023-10-23 MED ORDER — POTASSIUM CHLORIDE 20 MEQ PO PACK: 20.0000 meq | PACK | Freq: Every day | ORAL | 3 refills | Status: DC

## 2023-11-13 ENCOUNTER — Other Ambulatory Visit: Payer: Self-pay | Admitting: *Deleted

## 2023-11-13 MED ORDER — LENALIDOMIDE 10 MG PO CAPS: 10.0000 mg | ORAL_CAPSULE | Freq: Every day | ORAL | 0 refills | Status: DC

## 2023-11-26 ENCOUNTER — Observation Stay (HOSPITAL_COMMUNITY)
Admission: EM | Admit: 2023-11-26 | Discharge: 2023-11-27 | Disposition: A | Discharge status: Home / Self Care | Attending: Family Medicine | Admitting: Family Medicine

## 2023-11-26 ENCOUNTER — Other Ambulatory Visit: Payer: Self-pay

## 2023-11-26 ENCOUNTER — Emergency Department (HOSPITAL_COMMUNITY)

## 2023-11-26 ENCOUNTER — Encounter (HOSPITAL_COMMUNITY): Payer: Self-pay

## 2023-11-26 ENCOUNTER — Observation Stay (HOSPITAL_COMMUNITY)

## 2023-11-26 DIAGNOSIS — R609 Edema, unspecified: Secondary | ICD-10-CM | POA: Diagnosis not present

## 2023-11-26 DIAGNOSIS — R0602 Shortness of breath: Secondary | ICD-10-CM | POA: Diagnosis not present

## 2023-11-26 DIAGNOSIS — Z79899 Other long term (current) drug therapy: Secondary | ICD-10-CM | POA: Insufficient documentation

## 2023-11-26 DIAGNOSIS — Z9049 Acquired absence of other specified parts of digestive tract: Secondary | ICD-10-CM | POA: Insufficient documentation

## 2023-11-26 DIAGNOSIS — I10 Essential (primary) hypertension: Secondary | ICD-10-CM | POA: Diagnosis not present

## 2023-11-26 DIAGNOSIS — J470 Bronchiectasis with acute lower respiratory infection: Secondary | ICD-10-CM | POA: Diagnosis not present

## 2023-11-26 DIAGNOSIS — L89151 Pressure ulcer of sacral region, stage 1: Secondary | ICD-10-CM | POA: Diagnosis not present

## 2023-11-26 DIAGNOSIS — H409 Unspecified glaucoma: Secondary | ICD-10-CM | POA: Insufficient documentation

## 2023-11-26 DIAGNOSIS — E871 Hypo-osmolality and hyponatremia: Secondary | ICD-10-CM | POA: Insufficient documentation

## 2023-11-26 DIAGNOSIS — E039 Hypothyroidism, unspecified: Secondary | ICD-10-CM | POA: Diagnosis not present

## 2023-11-26 DIAGNOSIS — A0471 Enterocolitis due to Clostridium difficile, recurrent: Secondary | ICD-10-CM | POA: Insufficient documentation

## 2023-11-26 DIAGNOSIS — L03119 Cellulitis of unspecified part of limb: Secondary | ICD-10-CM | POA: Diagnosis present

## 2023-11-26 DIAGNOSIS — Z96642 Presence of left artificial hip joint: Secondary | ICD-10-CM | POA: Diagnosis not present

## 2023-11-26 DIAGNOSIS — L089 Local infection of the skin and subcutaneous tissue, unspecified: Secondary | ICD-10-CM | POA: Diagnosis present

## 2023-11-26 DIAGNOSIS — L03115 Cellulitis of right lower limb: Principal | ICD-10-CM | POA: Insufficient documentation

## 2023-11-26 DIAGNOSIS — D72819 Decreased white blood cell count, unspecified: Secondary | ICD-10-CM | POA: Diagnosis not present

## 2023-11-26 DIAGNOSIS — Z85828 Personal history of other malignant neoplasm of skin: Secondary | ICD-10-CM | POA: Diagnosis not present

## 2023-11-26 DIAGNOSIS — Z7982 Long term (current) use of aspirin: Secondary | ICD-10-CM | POA: Diagnosis not present

## 2023-11-26 DIAGNOSIS — A498 Other bacterial infections of unspecified site: Secondary | ICD-10-CM | POA: Diagnosis not present

## 2023-11-26 DIAGNOSIS — D649 Anemia, unspecified: Secondary | ICD-10-CM | POA: Insufficient documentation

## 2023-11-26 DIAGNOSIS — R600 Localized edema: Secondary | ICD-10-CM

## 2023-11-26 DIAGNOSIS — Z8579 Personal history of other malignant neoplasms of lymphoid, hematopoietic and related tissues: Secondary | ICD-10-CM | POA: Diagnosis not present

## 2023-11-26 DIAGNOSIS — L0390 Cellulitis, unspecified: Principal | ICD-10-CM

## 2023-11-26 LAB — CBC WITH DIFFERENTIAL/PLATELET
Abs Immature Granulocytes: 0.02 10*3/uL (ref 0.00–0.07)
Basophils Absolute: 0.1 10*3/uL (ref 0.0–0.1)
Basophils Relative: 1 %
Eosinophils Absolute: 0.4 10*3/uL (ref 0.0–0.5)
Eosinophils Relative: 8 %
HCT: 31.1 % — ABNORMAL LOW (ref 36.0–46.0)
Hemoglobin: 10.2 g/dL — ABNORMAL LOW (ref 12.0–15.0)
Immature Granulocytes: 0 %
Lymphocytes Relative: 25 %
Lymphs Abs: 1.2 10*3/uL (ref 0.7–4.0)
MCH: 32.2 pg (ref 26.0–34.0)
MCHC: 32.8 g/dL (ref 30.0–36.0)
MCV: 98.1 fL (ref 80.0–100.0)
Monocytes Absolute: 0.6 10*3/uL (ref 0.1–1.0)
Monocytes Relative: 13 %
Neutro Abs: 2.5 10*3/uL (ref 1.7–7.7)
Neutrophils Relative %: 53 %
Platelets: 178 10*3/uL (ref 150–400)
RBC: 3.17 MIL/uL — ABNORMAL LOW (ref 3.87–5.11)
RDW: 15.9 % — ABNORMAL HIGH (ref 11.5–15.5)
WBC: 4.7 10*3/uL (ref 4.0–10.5)
nRBC: 0 % (ref 0.0–0.2)

## 2023-11-26 LAB — COMPREHENSIVE METABOLIC PANEL WITH GFR
ALT: 31 U/L (ref 0–44)
AST: 58 U/L — ABNORMAL HIGH (ref 15–41)
Albumin: 3.9 g/dL (ref 3.5–5.0)
Alkaline Phosphatase: 46 U/L (ref 38–126)
Anion gap: 11 (ref 5–15)
BUN: 14 mg/dL (ref 8–23)
CO2: 29 mmol/L (ref 22–32)
Calcium: 8.8 mg/dL — ABNORMAL LOW (ref 8.9–10.3)
Chloride: 92 mmol/L — ABNORMAL LOW (ref 98–111)
Creatinine, Ser: 0.97 mg/dL (ref 0.44–1.00)
GFR, Estimated: >60 mL/min (ref >60)
Glucose, Bld: 98 mg/dL (ref 70–99)
Potassium: 3.9 mmol/L (ref 3.5–5.1)
Sodium: 132 mmol/L — ABNORMAL LOW (ref 135–145)
Total Bilirubin: 0.6 mg/dL (ref 0.0–1.2)
Total Protein: 6.7 g/dL (ref 6.5–8.1)

## 2023-11-26 LAB — I-STAT CHEM 8, ED
BUN: 18 mg/dL (ref 8–23)
Calcium, Ion: 1.04 mmol/L — ABNORMAL LOW (ref 1.15–1.40)
Chloride: 90 mmol/L — ABNORMAL LOW (ref 98–111)
Creatinine, Ser: 1.1 mg/dL — ABNORMAL HIGH (ref 0.44–1.00)
Glucose, Bld: 96 mg/dL (ref 70–99)
HCT: 33 % — ABNORMAL LOW (ref 36.0–46.0)
Hemoglobin: 11.2 g/dL — ABNORMAL LOW (ref 12.0–15.0)
Potassium: 3.8 mmol/L (ref 3.5–5.1)
Sodium: 129 mmol/L — ABNORMAL LOW (ref 135–145)
TCO2: 32 mmol/L (ref 22–32)

## 2023-11-26 LAB — LACTIC ACID, PLASMA
Lactic Acid, Venous: 1.1 mmol/L (ref 0.5–1.9)
Lactic Acid, Venous: 1 mmol/L (ref 0.5–1.9)

## 2023-11-26 MED ORDER — DICYCLOMINE HCL 10 MG PO CAPS: 10.0000 mg | ORAL_CAPSULE | Freq: Two times a day (BID) | ORAL | Status: DC | PRN

## 2023-11-26 MED ORDER — ACETAMINOPHEN 500 MG PO TABS: 1000.0000 mg | ORAL_TABLET | Freq: Four times a day (QID) | ORAL | Status: DC | PRN

## 2023-11-26 MED ORDER — SODIUM CHLORIDE 0.9% FLUSH
3.0000 mL | Freq: Two times a day (BID) | INTRAVENOUS | Status: DC
  Administered 2023-11-26 – 2023-11-27 (×3): 3 mL via INTRAVENOUS

## 2023-11-26 MED ORDER — LATANOPROST 0.005 % OP SOLN
1.0000 [drp] | Freq: Every day | OPHTHALMIC | Status: DC
  Administered 2023-11-26: 1 [drp] via OPHTHALMIC
  Filled 2023-11-26: qty 2.5

## 2023-11-26 MED ORDER — LISINOPRIL 10 MG PO TABS
20.0000 mg | ORAL_TABLET | Freq: Every day | ORAL | Status: DC
  Administered 2023-11-26 – 2023-11-27 (×2): 20 mg via ORAL
  Filled 2023-11-26 (×2): qty 2

## 2023-11-26 MED ORDER — LENALIDOMIDE 10 MG PO CAPS
10.0000 mg | ORAL_CAPSULE | Freq: Every day | ORAL | Status: DC
Start: 2023-11-26 — End: 2023-11-26

## 2023-11-26 MED ORDER — ALBUTEROL SULFATE (2.5 MG/3ML) 0.083% IN NEBU: 2.5000 mg | INHALATION_SOLUTION | Freq: Four times a day (QID) | RESPIRATORY_TRACT | Status: DC | PRN

## 2023-11-26 MED ORDER — PREDNISONE 5 MG PO TABS: 5.0000 mg | ORAL_TABLET | Freq: Every day | ORAL | Status: DC

## 2023-11-26 MED ORDER — FENTANYL CITRATE PF 50 MCG/ML IJ SOSY
50.0000 ug | PREFILLED_SYRINGE | Freq: Once | INTRAMUSCULAR | Status: AC
  Administered 2023-11-26: 50 ug via INTRAVENOUS
  Filled 2023-11-26: qty 1

## 2023-11-26 MED ORDER — VANCOMYCIN HCL IN DEXTROSE 1-5 GM/200ML-% IV SOLN
20.0000 mg/kg | Freq: Once | INTRAVENOUS | Status: AC
  Administered 2023-11-26: 926 mg via INTRAVENOUS
  Filled 2023-11-26: qty 200

## 2023-11-26 MED ORDER — OXYCODONE HCL 5 MG PO TABS
5.0000 mg | ORAL_TABLET | ORAL | Status: DC | PRN
  Administered 2023-11-26: 5 mg via ORAL
  Filled 2023-11-26: qty 1

## 2023-11-26 MED ORDER — ALBUTEROL SULFATE HFA 108 (90 BASE) MCG/ACT IN AERS: 2.0000 | INHALATION_SPRAY | Freq: Four times a day (QID) | RESPIRATORY_TRACT | Status: DC | PRN

## 2023-11-26 MED ORDER — ENOXAPARIN SODIUM 40 MG/0.4ML IJ SOSY
40.0000 mg | PREFILLED_SYRINGE | INTRAMUSCULAR | Status: DC
  Administered 2023-11-26 – 2023-11-27 (×2): 40 mg via SUBCUTANEOUS
  Filled 2023-11-26 (×2): qty 0.4

## 2023-11-26 MED ORDER — VANCOMYCIN HCL 125 MG PO CAPS
125.0000 mg | ORAL_CAPSULE | Freq: Every day | ORAL | Status: DC
  Administered 2023-11-26 – 2023-11-27 (×2): 125 mg via ORAL
  Filled 2023-11-26 (×3): qty 1

## 2023-11-26 MED ORDER — HYDROMORPHONE HCL 1 MG/ML IJ SOLN
0.5000 mg | Freq: Once | INTRAMUSCULAR | Status: AC
  Administered 2023-11-26: 0.5 mg via INTRAVENOUS
  Filled 2023-11-26: qty 0.5

## 2023-11-26 MED ORDER — OXYCODONE HCL 5 MG PO TABS: 2.5000 mg | ORAL_TABLET | ORAL | Status: DC | PRN

## 2023-11-26 MED ORDER — ASPIRIN 81 MG PO TBEC
81.0000 mg | DELAYED_RELEASE_TABLET | Freq: Every day | ORAL | Status: DC
  Administered 2023-11-26 – 2023-11-27 (×2): 81 mg via ORAL
  Filled 2023-11-26 (×2): qty 1

## 2023-11-26 MED ORDER — VANCOMYCIN HCL 500 MG/100ML IV SOLN
500.0000 mg | INTRAVENOUS | Status: DC
  Administered 2023-11-27: 500 mg via INTRAVENOUS
  Filled 2023-11-26 (×2): qty 100

## 2023-11-26 MED ORDER — UMECLIDINIUM-VILANTEROL 62.5-25 MCG/ACT IN AEPB
1.0000 | INHALATION_SPRAY | Freq: Every day | RESPIRATORY_TRACT | Status: DC
  Administered 2023-11-27: 1 via RESPIRATORY_TRACT
  Filled 2023-11-26: qty 14

## 2023-11-26 MED ORDER — LACTATED RINGERS IV SOLN: INTRAVENOUS | Status: DC

## 2023-11-26 NOTE — Plan of Care (Signed)

## 2023-11-26 NOTE — Progress Notes (Addendum)
 Pharmacy Antibiotic Note  Amanda Conner is a 73 y.o. female admitted on 11/26/2023 with cellulitis. PMH pertinent for: MAC and M abscessus currently off antibiotics, GERD, HTN, MM s/p 8 cycles of Dara/RVD last 03/01/22 currently on maintenance revlimid , T11-T12 vertebroplasty. Patient also has had 7 rounds of antibiotics for recurrent C. Diff infection pending stool transplant consultation at [facility].   Pharmacy has been consulted for Vancomycin  dosing.  Plan: -Vancomycin  1g IV x1 -Vancomycin  500mg  IV every 24 hours (AUC 468, Vd 0.72, TBW, sCr 1.1) -Will run over 2 hours given past reaction to vancomycin  IV -Vancomycin  PO 125mg  daily -Monitor renal function -Follow up signs of clinical improvement, LOT, de-escalation of antibiotics   Height: 5\' 2"  (157.5 cm) Weight: 46.3 kg (102 lb 1.2 oz) IBW/kg (Calculated) : 50.1  Temp (24hrs), Avg:97.8 F (36.6 C), Min:97.7 F (36.5 C), Max:97.8 F (36.6 C)  Recent Labs  Lab 11/26/23 0144 11/26/23 0151  WBC 4.7  --   CREATININE 0.97 1.10*  LATICACIDVEN 1.1  --     Estimated Creatinine Clearance: 33.3 mL/min (A) (by C-G formula based on SCr of 1.1 mg/dL (H)).    Allergies  Allergen Reactions   Tobramycin-Dexamethasone  Other (See Comments)    Itching and swelling   Ethambutol Other (See Comments)    Fever   Neomycin-Polymyxin-Dexameth Itching and Swelling   Amikacin Other (See Comments)    Eosinophilia with chills and aching when given with cefoxitin   Biaxin [Clarithromycin] Itching   Cefoxitin Other (See Comments)    Eosinophlia when given with amikacin   Sulfamethoxazole-Trimethoprim Itching   Vancomycin      "Got red splotches on skin after several doses"   Bacitracin-Polymyxin B Rash    Antimicrobials this admission: Vancomycin  5/14 >>   Microbiology results: 5/14 BCx:   Thank you for allowing pharmacy to be a part of this patient's care.  Young Hensen, PharmD, BCPS Clinical Pharmacist 11/26/2023 5:16 AM

## 2023-11-26 NOTE — Progress Notes (Addendum)
 PROGRESS NOTE  Amanda Conner, is a 73 y.o. female, DOB - 16-Sep-1950, BJY:[id number]  Admit date - 11/26/2023   Admitting Physician Arnulfo Larch, MD  Outpatient Primary MD for the patient is Hungarland, [name], MD  LOS - 0  Chief Complaint  Patient presents with   Wound Infection      Brief Narrative:  73 y.o. 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   -Assessment and Plan: 1)Rt Leg Cellulitis--- please see photos in epic - No fevers or leukocytosis (at baseline patient has chronic leukopenia in the setting of bone marrow disorder/multiple myeloma) - Patient reports subjective chills - Exam of Rt Leg with erythema and swelling and tenderness (new Rt Leg Medial wound at the medial ankle with clear/ yellow, bloody drainage, and rapid spreading erythema around this area ) WBC 3.9 >>4.7  -- Continue IV vancomycin --pending Blood Cx -Attempted to reach patient's infectious disease team at [facility] (Dr. Rufus Council and Dr. Lelia Putnam Zavala)--[phone] and [phone] -- Wound culture obtained and sent on 11/26/23  2)Rt Leg Swelling + Pain + Redness-patient is sedentary, on estrogen therapy - Venous Dopplers without DVT - Elevate right lower extremity - Please see photos in epic  3)Recurrent C diff Infection---7 prior episodes of  C diff Colitis--currently completing oral Vanco taper with plan for upcoming FMT on 12/01/23--- after coming off oral Vanco taper --Attempted to reach patient's infectious disease team at [facility] (Dr. Rufus Council and Dr. Monzon Zavala)--[phone] and [phone] -- Continue oral vancomycin  while on systemic antibiotics (vancomycin  125 mg p.o. daily  while on antibiotic for C. difficile prophylaxis)----prior to admission patient has been tapered down to oral vancomycin  on a twice weekly  4)Reported stage I pressure wound sacrum, present on admission  - Pressure injury prevention bed - Turn patient every 2 hours, skin care daily.  5)HypoNatremia--- sodium up to 132 from 129 - Maintain adequate hydration  6) generalized weakness/ambulatory dysfunction--- patient uses walker to transfer and wheelchair for ambulation at baseline  Chronic medical problems: History multiple myeloma: Continue home Revlimid , aspirin  for thromboprophylaxis.  Leukopenia, anemia: Stable hgb, WBC normalized in setting of multiple myeloma  Chronic nontuberculous mycobacterial infection of the lung, bronchiectasis: Not on antimicrobial treatment at this time.  Follows with ID at [facility].  Continue PTA bronchodilators Hypertension: Continue home lisinopril  Glaucoma: Home latanoprost  GTTs.   -- Plan of care discussed with patient and daughter at bedside -Reached out to patient's infectious disease team at [facility] Total care time over 43 minutes  Status is: Inpatient  Disposition: The patient is from: Home              Anticipated d/c is to: Home              Anticipated d/c date is: 1 day              Patient currently is not medically stable to d/c. Barriers: Not Clinically Stable-   Code Status :  -  Code Status: Full Code   Family Communication:  (patient is alert, awake and coherent)  Discussed with Daughter [name] at bedside  DVT Prophylaxis  :   - SCDs  enoxaparin (LOVENOX) injection 40 mg Start: 11/26/23 1000  Lab Results  Component Value Date   PLT 178 11/26/2023   Inpatient Medications  Scheduled Meds:  aspirin  EC  81 mg Oral Daily   enoxaparin (LOVENOX) injection  40 mg Subcutaneous Q24H   latanoprost   1 drop Both Eyes QHS   lisinopril   20 mg Oral Daily   sodium chloride  flush  3 mL Intravenous Q12H   umeclidinium-vilanterol  1 puff Inhalation  Daily   vancomycin   125 mg Oral Daily   Continuous Infusions:  [START ON 11/27/2023] vancomycin      PRN Meds:.acetaminophen , albuterol , dicyclomine, oxyCODONE  **OR** oxyCODONE    Anti-infectives (From admission, onward)    Start     Dose/Rate Route Frequency Ordered Stop   11/27/23 0500  vancomycin  (VANCOREADY) IVPB 500 mg/100 mL        500 mg 50 mL/hr over 120 Minutes Intravenous Every 24 hours 11/26/23 0518     11/26/23 1000  vancomycin  (VANCOCIN ) capsule 125 mg       Note to Pharmacy: For C diff prophylaxis, hx recurrent c diff   125 mg Oral Daily 11/26/23 0507     11/26/23 0200  vancomycin  (VANCOCIN ) IVPB 1000 mg/200 mL premix        20 mg/kg  46.3 kg 92.6 mL/hr over 120 Minutes Intravenous  Once 11/26/23 0158 11/26/23 1610       Subjective: Raighan Wunderlich today has no fevers, no emesis,  No chest pain,    - Patient's daughter [name] at bedside, questions answered -- Right leg swelling warmth tenderness is not worse  Objective: Vitals:   11/26/23 0300 11/26/23 0330 11/26/23 0430 11/26/23 0617  BP: (!) 145/78 (!) 141/79 129/67 (!) 146/78  Pulse: (!) 49 (!) 48 (!) 50 (!) 49  Resp:   16 18  Temp:   97.7 F (36.5 C) 97.6 F (36.4 C)  TempSrc:   Oral Oral  SpO2: 94% 90% 99% 95%  Weight:    49.4 kg  Height:    5\' 2"  (1.575 m)    Intake/Output Summary (Last 24 hours) at 11/26/2023 1217 Last data filed at 11/26/2023 0600 Gross per 24 hour  Intake 233.06 ml  Output --  Net 233.06 ml   Filed Weights   11/26/23 0104 11/26/23 0617  Weight: 46.3 kg 49.4 kg    Physical Exam  Gen:- Awake Alert,  in no apparent distress  HEENT:- [place].AT, No sclera icterus Neck-Supple Neck,No JVD,.  Lungs-  CTAB , fair symmetrical air movement CV- S1, S2 normal, regular  Abd-  +ve B.Sounds, Abd Soft, No tenderness,    Extremity--pedal pulses present  Psych-affect is appropriate, oriented x3 Neuro-generalized weakness, no new focal deficits, no tremors MSK--right leg swelling,  redness, warmth and tenderness----please see photos in epic  Data Reviewed: I have personally reviewed following labs and imaging studies  CBC: Recent Labs  Lab 11/26/23 0144 11/26/23 0151  WBC 4.7  --   NEUTROABS 2.5  --   HGB 10.2* 11.2*  HCT 31.1* 33.0*  MCV 98.1  --   PLT 178  --    Basic Metabolic Panel: Recent Labs  Lab 11/26/23 0144 11/26/23 0151  NA 132* 129*  K 3.9 3.8  CL 92* 90*  CO2 29  --   GLUCOSE 98 96  BUN 14 18  CREATININE 0.97 1.10*  CALCIUM  8.8*  --    GFR: Estimated Creatinine Clearance: 35.5 mL/min (A) (by C-G formula based on SCr of 1.1 mg/dL (H)). Liver Function Tests: Recent Labs  Lab 11/26/23 0144  AST 58*  ALT 31  ALKPHOS 46  BILITOT 0.6  PROT 6.7  ALBUMIN  3.9   Recent Results (from the past 240 hours)  Blood culture (routine x 2)     Status: None (Preliminary result)   Collection Time: 11/26/23  1:44 AM   Specimen: BLOOD  Result Value Ref Range Status   Specimen Description BLOOD BLOOD LEFT ARM  Final   Special Requests   Final    BOTTLES DRAWN AEROBIC AND ANAEROBIC Blood Culture adequate volume   Culture   Final    NO GROWTH < 12 HOURS Performed at [facility], [address], [place], [place] [address]    Report Status PENDING  Incomplete  Blood culture (routine x 2)     Status: None (Preliminary result)   Collection Time: 11/26/23  2:13 AM   Specimen: BLOOD  Result Value Ref Range Status   Specimen Description BLOOD BLOOD LEFT ARM  Final   Special Requests   Final    BOTTLES DRAWN AEROBIC AND ANAEROBIC Blood Culture adequate volume   Culture   Final    NO GROWTH < 12 HOURS Performed at [facility], [address], [place], [place] [address]    Report Status PENDING  Incomplete    Radiology Studies: US  Venous Img Lower Unilateral Right (DVT) Result Date: 11/26/2023 CLINICAL DATA:  Swelling, redness and edema EXAM: Right LOWER EXTREMITY VENOUS DOPPLER ULTRASOUND TECHNIQUE: Gray-scale sonography with graded compression,  as well as color Doppler and duplex ultrasound were performed to evaluate the lower extremity deep venous systems from the level of the common femoral vein and including the common femoral, femoral, profunda femoral, popliteal and calf veins including the posterior tibial, peroneal and gastrocnemius veins when visible. The superficial great saphenous vein was also interrogated. Spectral Doppler was utilized to evaluate flow at rest and with distal augmentation maneuvers in the common femoral, femoral and popliteal veins. COMPARISON:  None Available. FINDINGS: Contralateral Common Femoral Vein: Respiratory phasicity is normal and symmetric with the symptomatic side. No evidence of thrombus. Normal compressibility. Common Femoral Vein: No evidence of thrombus. Normal compressibility, respiratory phasicity and response to augmentation. Saphenofemoral Junction: No evidence of thrombus. Normal compressibility and flow on color Doppler imaging. Profunda Femoral Vein: No evidence of thrombus. Normal compressibility and flow on color Doppler imaging. Femoral Vein: No evidence of thrombus. Normal compressibility, respiratory phasicity and response to augmentation. Popliteal Vein: No evidence of thrombus. Normal compressibility, respiratory phasicity and response to augmentation. Calf Veins: No evidence of thrombus. Normal compressibility and flow on color Doppler imaging. Superficial Great Saphenous Vein: No evidence of thrombus. Normal compressibility. Venous Reflux:  None. Other Findings:  None. IMPRESSION: No evidence of right lower extremity DVT. Electronically Signed   By: Adrianna Horde M.D.   On: 11/26/2023 11:09   DG Tibia/Fibula Right Result Date: 11/26/2023 CLINICAL DATA:  eval for gas Delayed due to labs (patient a hard stick): eval for gas - leg edema and wound infection EXAM: RIGHT TIBIA AND FIBULA - 2 VIEW COMPARISON:  None Available. FINDINGS: Limited evaluation due to overlapping osseous structures and  overlying soft tissues. No cortical erosion or destruction. There is no evidence of fracture or other focal bone lesions. Diffuse subcutaneus soft tissue edema. No radiographic finding of subcutaneus soft tissue emphysema. IMPRESSION: 1. Diffuse subcutaneus soft tissue edema. 2. No radiographic finding of subcutaneus soft tissue emphysema. Electronically Signed   By: Morgane  Naveau M.D.   On: 11/26/2023 02:33   Scheduled Meds:  aspirin  EC  81  mg Oral Daily   enoxaparin (LOVENOX) injection  40 mg Subcutaneous Q24H   latanoprost   1 drop Both Eyes QHS   lisinopril   20 mg Oral Daily   sodium chloride  flush  3 mL Intravenous Q12H   umeclidinium-vilanterol  1 puff Inhalation Daily   vancomycin   125 mg Oral Daily   Continuous Infusions:  [START ON 11/27/2023] vancomycin       LOS: 0 days   Colin Dawley M.D on 11/26/2023 at 12:17 PM  Go to [email or web address] - for contact info  [facility] - Office  [phone]  If 7PM-7AM, please contact night-coverage [email or web address] 11/26/2023, 12:17 PM

## 2023-11-26 NOTE — ED Notes (Signed)
 Admitting provider at bedside.

## 2023-11-26 NOTE — ED Triage Notes (Signed)
 Pt to ED from home with spouse to be evaluated for wound on right lower leg, swelling has been for "a while" but wound, redness, and pain developed over past 48 hours.

## 2023-11-26 NOTE — ED Notes (Signed)
 Portable xray at bedside.

## 2023-11-26 NOTE — Progress Notes (Signed)
 Mobility Specialist Progress Note:    11/26/23 1330  Mobility  Activity Ambulated with assistance to bathroom  Level of Assistance Minimal assist, patient does 75% or more  Assistive Device Front wheel walker  Distance Ambulated (ft) 20 ft  Range of Motion/Exercises Active;All extremities  Activity Response Tolerated well  Mobility Referral Yes  Mobility visit 1 Mobility  Mobility Specialist Start Time (ACUTE ONLY) 1315  Mobility Specialist Stop Time (ACUTE ONLY) 1330  Mobility Specialist Time Calculation (min) (ACUTE ONLY) 15 min   Pt received in room with daughter, attempting to ambulate to bathroom. Required MinA to stand and ambulate with RW. Tolerated well, asx throughout. Returned pt supine, all needs met.  Glinda Lapping Mobility Specialist Please contact via Special educational needs teacher or  Rehab office at [phone]

## 2023-11-26 NOTE — H&P (Addendum)
 History and Physical    Amanda Conner GNF:[id number] DOB: March 08, 1951 DOA: 11/26/2023  PCP: Huston Maiers, MD   Patient coming from: Home   Chief Complaint:  Chief Complaint  Patient presents with   Wound Infection    HPI:  Amanda Conner is a 73 y.o. 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 Reports that yesterday noted pain near the ankle on the R. Today with new wound at the medial ankle with clear/ yellow, bloody drainage, and rapid spreading erythema around this area. Also having significant pain near the ankle. Otherwise reports having more chronic bilateral LE edema, but this has worsened in both legs in a symmetric fashion over past few weeks. In the past day noting that now has new asymmetric RLE swelling. No change in chronic SOB. No chest pain. She reports on and off fever / chills, but none acutely with the recent skin changes above.    Review of Systems:  + chronic loose stools, not currently c/w prior C diff episodes.  + chronic dyspnea relates to NTM lung infection   ROS complete and negative except as marked above   Allergies  Allergen Reactions   Tobramycin-Dexamethasone  Other (See Comments)    Itching and swelling   Ethambutol Other (See Comments)    Fever   Neomycin-Polymyxin-Dexameth Itching and Swelling   Amikacin Other (See Comments)    Eosinophilia with chills and aching when given with cefoxitin   Biaxin [Clarithromycin] Itching   Cefoxitin Other (See Comments)    Eosinophlia when given with amikacin   Sulfamethoxazole-Trimethoprim Itching   Vancomycin      "Got red splotches on skin after several doses"   Bacitracin-Polymyxin B Rash    Prior to Admission medications   Medication Sig Start Date End Date Taking? Authorizing  Provider  albuterol  (VENTOLIN  HFA) 108 (90 Base) MCG/ACT inhaler Inhale 2 puffs into the lungs every 6 (six) hours as needed for wheezing or shortness of breath.    [provider]  ammonium lactate (AMLACTIN) 12 % cream SMARTSIG:2-4 Gram(s) Topical Twice Daily 03/05/23   [provider]  ANORO ELLIPTA 62.5-25 MCG/ACT AEPB Inhale 1 puff into the lungs daily. 09/06/22   [provider]  aspirin  EC 81 MG tablet Take 81 mg by mouth daily. Swallow whole.    [provider]  Baclofen 5 MG TABS Take 1 tablet by mouth 3 (three) times daily as needed. 10/08/21   [provider]  benzonatate  (TESSALON ) 100 MG capsule Take 100 mg by mouth 3 (three) times daily as needed for cough.    [provider]  Biotin  1000 MCG tablet Take 1,000 mcg by mouth daily.    [provider]  calcitonin, salmon, (MIACALCIN/FORTICAL) 200 UNIT/ACT nasal spray Place 1 spray into alternate nostrils daily. 07/19/21   [provider]  Calcium  Carb-Cholecalciferol  600-800 MG-UNIT TABS Take 1 tablet by mouth daily.    [provider]  dextromethorphan -guaiFENesin  (MUCINEX  DM) 30-600 MG 12hr tablet Take 1 tablet by mouth daily.     [provider]  dicyclomine (BENTYL) 10 MG capsule Take 10 mg by mouth 2 (two) times daily. 09/30/23   [provider]  estrogen, conjugated,-medroxyprogesterone  (PREMPRO) 0.45-1.5 MG tablet Take 1 tablet by mouth daily.    [provider]  feeding supplement (ENSURE ENLIVE /  ENSURE PLUS) LIQD Take 237 mLs by mouth 2 (two) times daily between meals. Patient taking differently: Take 237 mLs by mouth daily. 08/09/21   Mason Sole, Pratik D, DO  fidaxomicin  (DIFICID ) 200 MG TABS tablet Take 1 tablet (200 mg total) by mouth 2 (two) times daily. 01/29/23   Paulett Boros, MD  fluticasone  (FLONASE ) 50 MCG/ACT nasal spray Place 1 spray into both nostrils daily.    [provider]  furosemide  (LASIX ) 20 MG  tablet TAKE 1 TABLET BY MOUTH EVERY DAY AS NEEDED 12/30/22   Paulett Boros, MD  hydrochlorothiazide  (HYDRODIURIL ) 25 MG tablet Take 25 mg by mouth daily as needed (swelling).    [provider]  HYDROcodone  bit-homatropine (HYCODAN) 5-1.5 MG/5ML syrup Take 5 mLs by mouth every 6 (six) hours as needed for cough. 10/17/21   Paulett Boros, MD  Lactulose  20 GM/30ML SOLN Take 30 ml by mouth every 3 hours until bowel movement is had, then take 30 ml daily Patient taking differently: Take 20 g by mouth daily as needed (constipation). 07/26/21   Paulett Boros, MD  latanoprost  (XALATAN ) 0.005 % ophthalmic solution Place 1 drop into both eyes at bedtime.     [provider]  lenalidomide  (REVLIMID ) 10 MG capsule Take 1 capsule (10 mg total) by mouth daily. 21 days on, 7 days off 11/13/23   Paulett Boros, MD  linaclotide  (LINZESS ) 145 MCG CAPS capsule Take 145 mcg by mouth daily as needed (constipation).    [provider]  lisinopril  (ZESTRIL ) 20 MG tablet Take 20 mg by mouth daily.    [provider]  loratadine  (CLARITIN ) 10 MG tablet Take 10 mg by mouth daily.    [provider]  loteprednol (LOTEMAX) 0.5 % ophthalmic suspension Place into the left eye. 06/17/22   [provider]  methocarbamol  (ROBAXIN ) 500 MG tablet Take 1 tablet (500 mg total) by mouth every 6 (six) hours as needed for muscle spasms. 01/05/19   Elna Haggis, MD  omeprazole (PRILOSEC) 20 MG capsule Take 20 mg by mouth daily.    [provider]  ondansetron  (ZOFRAN  ODT) 4 MG disintegrating tablet Take 1 tablet (4 mg total) by mouth every 4 (four) hours as needed for nausea or vomiting. 08/29/22   Paulett Boros, MD  Oxycodone  HCl 10 MG TABS Take 1 tablet (10 mg total) by mouth every 6 (six) hours as needed. 08/22/21   Katragadda, Sreedhar, MD  pantoprazole  (PROTONIX ) 40 MG tablet Take 40 mg by mouth daily.    [provider]  potassium chloride   (KLOR-CON ) 20 MEQ packet Take 20 mEq by mouth daily. 10/23/23   Paulett Boros, MD  sucralfate (CARAFATE) 1 g tablet Take 1 g by mouth 4 (four) times daily. 12/04/21   [provider]  SUMAtriptan  (IMITREX ) 100 MG tablet Take 100 mg by mouth every 2 (two) hours as needed for migraine. May repeat in 2 hours if headache persists or recurs.    [provider]  tiZANidine (ZANAFLEX) 2 MG tablet Take 2 mg by mouth every 6 (six) hours as needed. 05/30/22   [provider]  traMADol  (ULTRAM ) 50 MG tablet Take 50 mg by mouth 4 (four) times daily as needed. 09/15/23   [provider]  UNABLE TO FIND Place 0.5 g into both eyes 3 (three) times daily. Med Name: Loteman (otepredol)    [provider]    Past Medical History:  Diagnosis Date   Arthritis    osteoarthritis   Back pain  Bronchiectasis (HCC)    Cancer (HCC)    basal cell nose   Chronic cough    Complication of anesthesia    Dyspnea    Gastrointestinal symptoms    GERD (gastroesophageal reflux disease)    Glaucoma    History of hiatal hernia    Hyperlipemia    Hypertension    Multiple myeloma (HCC)    Osteopenia    Pneumonia    PONV (postoperative nausea and vomiting)    "only one time"   Spondylisthesis     Past Surgical History:  Procedure Laterality Date   ABDOMINAL EXPOSURE N/A 12/29/2018   Procedure: ABDOMINAL EXPOSURE;  Surgeon: Dannis Dy, MD;  Location[facility] OR;  Service: Vascular;  Laterality: N/A;   ABDOMINOPLASTY  2002   ABDOMINOPLASTY  1970's   ANKLE SURGERY Left 10/2015   ANTERIOR LATERAL LUMBAR FUSION WITH PERCUTANEOUS SCREW 3 LEVEL Left 12/29/2018   Procedure: Lumbar Two to Lumbar Five Anterolateral lumbar interbody fusion;  Surgeon: Manya Sells, MD;  Location: [facility] OR;  Service: Neurosurgery;  Laterality: Left;  anterolateral   ANTERIOR LUMBAR FUSION N/A 12/29/2018   Procedure: Lumbar Five Sacral One Anterior lumbar interbody fusion;  Surgeon: Manya Sells, MD;  Location: [facility] OR;  Service: Neurosurgery;  Laterality: N/A;  anterior approach   BASAL CELL CARCINOMA EXCISION  06/2018   nose   BLEPHAROPLASTY Bilateral 1999   BREAST ENHANCEMENT SURGERY  1970's   COLONOSCOPY     CYSTOURETHROSCOPY  2018   Doble J ureteal stents   DECOMPRESSION CORE HIP Left 2014   FACIAL COSMETIC SURGERY  2004   FLUOROSCOPY GUIDANCE     HIP ARTHROPLASTY Left    INCISIONAL HERNIA REPAIR N/A 10/08/2019   Procedure: OPEN INCISIONAL HERNIA REPAIR WITH MESH;  Surgeon: Oralee Billow, MD;  Location: [facility] ORS;  Service: General;  Laterality: N/A;   JOINT REPLACEMENT Left    Hip; 05/2014   KYPHOPLASTY N/A 08/31/2021   Procedure: KYPHOPLASTY THORACIC ELEVEN, THORACIC TWELVE;  Surgeon: Pincus Bridgeman, DO;  Location: [facility] OR;  Service: Neurosurgery;  Laterality: N/A;   LAPAROSCOPIC PARTIAL COLECTOMY  06/26/2017   LUMBAR PERCUTANEOUS PEDICLE SCREW 4 LEVEL N/A 12/29/2018   Procedure: Lumbar Percutaneous Pedicle Screw Placement Lumbar two-Sacral one;  Surgeon: Manya Sells, MD;  Location: [facility] OR;  Service: Neurosurgery;  Laterality: N/A;   MOHS SURGERY  2019   nose   PARTIAL COLECTOMY  06/2017   for diverticulitis   REMOVAL OF BILATERAL TISSUE EXPANDERS WITH PLACEMENT OF BILATERAL BREAST IMPLANTS  2004   THIGH LIFT  1994   TOE FUSION Right    great toe   TONSILLECTOMY     UMBILICAL HERNIA REPAIR     with tummy tuck   UMBILICAL HERNIA REPAIR  2002     reports that she has never smoked. She has never used smokeless tobacco. She reports current alcohol use. She reports that she does not use drugs.  Family History  Problem Relation Age of Onset   Hypertension Mother    COPD Mother    Hypertension Father    Heart disease Father      Physical Exam: Vitals:   11/26/23 0300 11/26/23 0330 11/26/23 0430 11/26/23 0617  BP: (!) 145/78 (!) 141/79 129/67 (!) 146/78  Pulse: (!) 49 (!) 48 (!) 50 (!) 49  Resp:   16 18  Temp:   97.7 F (36.5 C) 97.6 F (36.4 C)  TempSrc:    Oral Oral  SpO2: 94% 90% 99% 95%  Weight:    49.4 kg  Height:    5\' 2"  (1.575 m)    Gen: Awake, alert, chronically ill appearing  HEENT:: Hoarse CV: Regular, normal S1, S2, no murmurs  Resp: Normal WOB, with scatterred rales.  Abd: round, slightly distended, normoactive, nontender MSK: asymmetric RLE > LLE edema, pitting edema on the R from the knee down. L moreso localized below the ankle.  Skin: scattered purpura over LE, small ~ 1.5 cm hemorrhagic pustule on the R medial ankle with surrounding erythema, warmth which has been outline in the ED.  Neuro: Alert and interactive  Psych: euthymic, appropriate    Data review:   Labs reviewed, notable for:   NA 132, near baseline WBC 4, hemoglobin 10  Micro:  Results for orders placed or performed in visit on 03/31/23  C difficile quick screen w PCR reflex     Status: Abnormal   Collection Time: 03/31/23  2:36 PM   Specimen: STOOL  Result Value Ref Range Status   C Diff antigen POSITIVE (A) NEGATIVE Final   C Diff toxin NEGATIVE NEGATIVE Final   C Diff interpretation Results are indeterminate. See PCR results.  Final    Comment: Performed at [facility], [address], [place], [place] [address]  C. Diff by PCR, Reflexed     Status: None   Collection Time: 03/31/23  2:36 PM  Result Value Ref Range Status   Toxigenic C. Difficile by PCR NEGATIVE NEGATIVE Final    Comment: Patient is colonized with non toxigenic C. difficile. May not need treatment unless significant symptoms are present. Performed at [facility], [address], [place], [place] [address]     Imaging reviewed:  DG Tibia/Fibula Right Result Date: 11/26/2023 CLINICAL DATA:  eval for gas Delayed due to labs (patient a hard stick): eval for gas - leg edema and wound infection EXAM: RIGHT TIBIA AND FIBULA - 2 VIEW COMPARISON:  None Available. FINDINGS: Limited evaluation due to overlapping osseous structures and overlying soft tissues. No cortical erosion  or destruction. There is no evidence of fracture or other focal bone lesions. Diffuse subcutaneus soft tissue edema. No radiographic finding of subcutaneus soft tissue emphysema. IMPRESSION: 1. Diffuse subcutaneus soft tissue edema. 2. No radiographic finding of subcutaneus soft tissue emphysema. Electronically Signed   By: Morgane  Naveau M.D.   On: 11/26/2023 02:33     ED Course:  Treated with vancomycin  IV, Dilaudid , fentanyl , started on LR at 40 cc an hour.   Assessment/Plan:  73 y.o. 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   RLE cellulitis  Asymmetric RLE edema  Acute onset with development of a central draining pustule and relatively rapid spread of erythema around this, pain near the ankle. Afebrile, WBC 4 (hx leukopenia with multiple myeloma). XR with soft tissue swelling, no subcutaneous emphysema.  - Continue vancomycin , pharmacy to dose. - Antibiotic prophylaxis for history of recurrent C. difficile per below. - Duplex right lower extremity rule out DVT in setting of asymmetric edema  History of recurrent C. difficile Reports ongoing chronic loose stools although not consistent with prior history of C. Difficile.  - Started on vancomycin  125 mg p.o. daily while on antibiotic for C. difficile prophylaxis. - Was planned for upcoming FMT next week, suspect will need to be delayed with  abx use. Advised her to contact   Mild hyponatremia, chronic Monitor BMP   Reported stage I pressure wound sacrum, present on admission  - Pressure injury prevention bed - Turn patient every 2 hours, skin care daily.  Chronic medical problems: History multiple myeloma: Continue home Revlimid , aspirin  for thromboprophylaxis.  Leukopenia, anemia: Stable, in setting of multiple myeloma   Chronic nontuberculous mycobacterial infection of the lung, bronchiectasis: Not on antimicrobial treatment at this time.  Follows with ID at [facility].  Continue home Anoro Ellipta, albuterol  prn. Hypertension: Continue home lisinopril  Glaucoma: Home latanoprost  GTTs.   Body mass index is 19.92 kg/m.    DVT prophylaxis:  Lovenox Code Status:  DNR with Intubation; Confirmed with patient  Diet:  Diet Orders (From admission, onward)     Start     Ordered   11/26/23 0617  DIET SOFT Room service appropriate? Yes; Fluid consistency: Thin  Diet effective now       Comments: Lactose free, no beef  Question Answer Comment  Room service appropriate? Yes   Fluid consistency: Thin      11/26/23 0616           Family Communication:  Yes discussed with husband at bedside   Consults:  None   Admission status:   Observation, Med-Surg  Severity of Illness: The appropriate patient status for this patient is OBSERVATION. Observation status is judged to be reasonable and necessary in order to provide the required intensity of service to ensure the patient's safety. The patient's presenting symptoms, physical exam findings, and initial radiographic and laboratory data in the context of their medical condition is felt to place them at decreased risk for further clinical deterioration. Furthermore, it is anticipated that the patient will be medically stable for discharge from the hospital within 2 midnights of admission.    Arnulfo Larch, MD [facility]  How to contact the [facility] Attending or Consulting provider 7A - 7P or covering provider during after hours 7P -7A, for this patient.  Check the care team in [facility] and look for a) attending/consulting [facility] provider listed and b) the [facility] team listed Log into [email or web address] and use [place]'s universal password to access. If you do not have the password, please contact the hospital operator. Locate the [facility] provider you are looking for under [facility] and page to a number that you can be directly reached. If you still have difficulty reaching the provider, please page the [facility] (Director on Call) for the Hospitalists listed on amion for assistance.  11/26/2023, 6:21 AM

## 2023-11-26 NOTE — Plan of Care (Signed)
  Problem: Education: Goal: Knowledge of General Education information will improve Description: Including pain rating scale, medication(s)/side effects and non-pharmacologic comfort measures Outcome: Progressing   Problem: Clinical Measurements: Goal: Ability to maintain clinical measurements within normal limits will improve Outcome: Progressing   Problem: Activity: Goal: Risk for activity intolerance will decrease Outcome: Progressing   Problem: Nutrition: Goal: Adequate nutrition will be maintained Outcome: Progressing   Problem: Coping: Goal: Level of anxiety will decrease Outcome: Progressing   Problem: Pain Managment: Goal: General experience of comfort will improve and/or be controlled Outcome: Progressing   Problem: Safety: Goal: Ability to remain free from injury will improve Outcome: Progressing   Problem: Skin Integrity: Goal: Risk for impaired skin integrity will decrease Outcome: Progressing

## 2023-11-26 NOTE — ED Provider Notes (Signed)
 [place] EMERGENCY DEPARTMENT AT [facility] Provider Note   CSN: [id number] Arrival date & time: 11/26/23  0046     History  Chief Complaint  Patient presents with   Wound Infection    Amanda Conner is a 73 y.o. female.  73 year old female who presents ER today secondary to a wound on her right leg.  Patient has a history of wounds and poor healing.  She has history multiple myeloma.  She has a history of C. difficile currently just finished antibiotics and is scheduled for stool transplant consultation on Monday.  Patient states that the wound had been there for a while but just over the last 24 hours or so has become painful and red and seems to be spreading quickly.  No fevers, nausea, vomiting, weakness or other systemic symptoms.        Home Medications Prior to Admission medications   Medication Sig Start Date End Date Taking? Authorizing Provider  albuterol  (VENTOLIN  HFA) 108 (90 Base) MCG/ACT inhaler Inhale 2 puffs into the lungs every 6 (six) hours as needed for wheezing or shortness of breath.    [provider]  ammonium lactate (AMLACTIN) 12 % cream SMARTSIG:2-4 Gram(s) Topical Twice Daily 03/05/23   [provider]  ANORO ELLIPTA 62.5-25 MCG/ACT AEPB Inhale 1 puff into the lungs daily. 09/06/22   [provider]  aspirin  EC 81 MG tablet Take 81 mg by mouth daily. Swallow whole.    [provider]  Baclofen 5 MG TABS Take 1 tablet by mouth 3 (three) times daily as needed. 10/08/21   [provider]  benzonatate  (TESSALON ) 100 MG capsule Take 100 mg by mouth 3 (three) times daily as needed for cough.    [provider]  Biotin  1000 MCG tablet Take 1,000 mcg by mouth daily.    [provider]  calcitonin, salmon, (MIACALCIN/FORTICAL) 200 UNIT/ACT nasal spray Place 1 spray into alternate nostrils daily. 07/19/21   [provider]  Calcium  Carb-Cholecalciferol  600-800 MG-UNIT TABS Take 1  tablet by mouth daily.    [provider]  dextromethorphan -guaiFENesin  (MUCINEX  DM) 30-600 MG 12hr tablet Take 1 tablet by mouth daily.     [provider]  dicyclomine (BENTYL) 10 MG capsule Take 10 mg by mouth 2 (two) times daily. 09/30/23   [provider]  estrogen, conjugated,-medroxyprogesterone  (PREMPRO) 0.45-1.5 MG tablet Take 1 tablet by mouth daily.    [provider]  feeding supplement (ENSURE ENLIVE / ENSURE PLUS) LIQD Take 237 mLs by mouth 2 (two) times daily between meals. Patient taking differently: Take 237 mLs by mouth daily. 08/09/21   Mason Sole, Pratik D, DO  fidaxomicin  (DIFICID ) 200 MG TABS tablet Take 1 tablet (200 mg total) by mouth 2 (two) times daily. 01/29/23   Paulett Boros, MD  fluticasone  (FLONASE ) 50 MCG/ACT nasal spray Place 1 spray into both nostrils daily.    [provider]  furosemide  (LASIX ) 20 MG tablet TAKE 1 TABLET BY MOUTH EVERY DAY AS NEEDED 12/30/22   Paulett Boros, MD  hydrochlorothiazide  (HYDRODIURIL ) 25 MG tablet Take 25 mg by mouth daily as needed (swelling).    [provider]  HYDROcodone  bit-homatropine (HYCODAN) 5-1.5 MG/5ML syrup Take 5 mLs by mouth every 6 (six) hours as needed for cough. 10/17/21   Paulett Boros, MD  Lactulose  20 GM/30ML SOLN Take 30 ml by mouth every 3 hours until bowel movement is had, then take 30 ml daily Patient taking differently: Take 20 g by  mouth daily as needed (constipation). 07/26/21   Katragadda, Sreedhar, MD  latanoprost  (XALATAN ) 0.005 % ophthalmic solution Place 1 drop into both eyes at bedtime.     [provider]  lenalidomide  (REVLIMID ) 10 MG capsule Take 1 capsule (10 mg total) by mouth daily. 21 days on, 7 days off 11/13/23   Paulett Boros, MD  linaclotide  (LINZESS ) 145 MCG CAPS capsule Take 145 mcg by mouth daily as needed (constipation).    [provider]  lisinopril  (ZESTRIL ) 20 MG tablet Take 20 mg by mouth daily.     [provider]  loratadine  (CLARITIN ) 10 MG tablet Take 10 mg by mouth daily.    [provider]  loteprednol (LOTEMAX) 0.5 % ophthalmic suspension Place into the left eye. 06/17/22   [provider]  methocarbamol  (ROBAXIN ) 500 MG tablet Take 1 tablet (500 mg total) by mouth every 6 (six) hours as needed for muscle spasms. 01/05/19   Elna Haggis, MD  omeprazole (PRILOSEC) 20 MG capsule Take 20 mg by mouth daily.    [provider]  ondansetron  (ZOFRAN  ODT) 4 MG disintegrating tablet Take 1 tablet (4 mg total) by mouth every 4 (four) hours as needed for nausea or vomiting. 08/29/22   Paulett Boros, MD  Oxycodone  HCl 10 MG TABS Take 1 tablet (10 mg total) by mouth every 6 (six) hours as needed. 08/22/21   Paulett Boros, MD  pantoprazole  (PROTONIX ) 40 MG tablet Take 40 mg by mouth daily.    [provider]  potassium chloride  (KLOR-CON ) 20 MEQ packet Take 20 mEq by mouth daily. 10/23/23   Paulett Boros, MD  sucralfate (CARAFATE) 1 g tablet Take 1 g by mouth 4 (four) times daily. 12/04/21   [provider]  SUMAtriptan  (IMITREX ) 100 MG tablet Take 100 mg by mouth every 2 (two) hours as needed for migraine. May repeat in 2 hours if headache persists or recurs.    [provider]  tiZANidine (ZANAFLEX) 2 MG tablet Take 2 mg by mouth every 6 (six) hours as needed. 05/30/22   [provider]  traMADol  (ULTRAM ) 50 MG tablet Take 50 mg by mouth 4 (four) times daily as needed. 09/15/23   [provider]  UNABLE TO FIND Place 0.5 g into both eyes 3 (three) times daily. Med Name: Loteman (otepredol)    [provider]      Allergies    Tobramycin-dexamethasone , Ethambutol, Neomycin-polymyxin-dexameth, Amikacin, Biaxin [clarithromycin], Cefoxitin, Sulfamethoxazole-trimethoprim, Vancomycin , and Bacitracin-polymyxin b    Review of Systems   Review of Systems  Physical Exam Updated Vital Signs BP (!)  154/89   Pulse (!) 53   Temp 97.8 F (36.6 C) (Oral)   Resp 18   Ht 5\' 2"  (1.575 m)   Wt 46.3 kg   SpO2 95%   BMI 18.67 kg/m  Physical Exam Vitals and nursing note reviewed.  Constitutional:      Appearance: She is well-developed.  HENT:     Head: Normocephalic and atraumatic.  Cardiovascular:     Rate and Rhythm: Normal rate and regular rhythm.  Pulmonary:     Effort: No respiratory distress.     Breath sounds: No stridor.  Abdominal:     General: There is no distension.  Musculoskeletal:     Cervical back: Normal range of motion.  Skin:    Comments: Lower extremities: Both full wounds in various stages of healing, purpura, petechiae which are chronic.  She has some evidence of chronic venous stasis in  both legs.  Her right anterior shin has not scabbed over wound at approximately 2 x 4 cm with surrounding erythema that has in total diameter of about 7-8 cm.  Tender, warm.  Her whole right leg is significantly swollen compared to her left.  She has intact DP pulses on the right.  Neurological:     Mental Status: She is alert.     ED Results / Procedures / Treatments   Labs (all labs ordered are listed, but only abnormal results are displayed) Labs Reviewed  CULTURE, BLOOD (ROUTINE X 2)  CULTURE, BLOOD (ROUTINE X 2)  CBC WITH DIFFERENTIAL/PLATELET  COMPREHENSIVE METABOLIC PANEL WITH GFR  LACTIC ACID, PLASMA  LACTIC ACID, PLASMA  I-STAT CHEM 8, ED    EKG None  Radiology No results found.  Procedures Procedures    Medications Ordered in ED Medications  fentaNYL  (SUBLIMAZE ) injection 50 mcg (has no administration in time range)    ED Course/ Medical Decision Making/ A&P                                 Medical Decision Making Amount and/or Complexity of Data Reviewed Labs: ordered. Radiology: ordered.  Risk Prescription drug management. Decision regarding hospitalization.  Cellulitis without e/o sepsis but with the reported quick progression, will  check labs and xr to ensure no e/o gas forming organism. Will discuss antibiotics with pharmacy with her cdiff history and extensive allergy list.  Pharmacy recommended vanc IV initially then doxy if/when d/c.  Patient doing well but the erythema seems to be worsening even while here. Still appears well overall but with age/worsening erythema will plan for observation/admission with [facility] until improvement.    Final Clinical Impression(s) / ED Diagnoses Final diagnoses:  Cellulitis, unspecified cellulitis site  Leg edema, right    Rx / DC Orders ED Discharge Orders     None         Stedman [place], Reymundo Caulk, MD 11/26/23 2307

## 2023-11-26 NOTE — ED Notes (Signed)
 Redness to R lower leg marked with skin marker

## 2023-11-26 NOTE — Care Management Obs Status (Signed)
 MEDICARE OBSERVATION STATUS NOTIFICATION   Patient Details  Name: Amanda Conner MRN: [id number] Date of Birth: 04-30-1951   Medicare Observation Status Notification Given:  Yes    Geraldina Klinefelter, RN 11/26/2023, 6:03 PM

## 2023-11-26 NOTE — Progress Notes (Signed)
   11/26/23 1013  TOC Brief Assessment  Insurance and Status Reviewed  Patient has primary care physician Yes  Home environment has been reviewed Home with Spouse  Prior level of function: Independent  Prior/Current Home Services No current home services  Social Drivers of Health Review SDOH reviewed no interventions necessary  Readmission risk has been reviewed Yes  Transition of care needs no transition of care needs at this time   Transition of Care Department [facility]) has reviewed patient and no TOC needs have been identified at this time. We will continue to monitor patient advancement through interdisciplinary progression rounds. If new patient transition needs arise, please place a TOC consult.

## 2023-11-27 DIAGNOSIS — A498 Other bacterial infections of unspecified site: Secondary | ICD-10-CM | POA: Diagnosis not present

## 2023-11-27 DIAGNOSIS — L03115 Cellulitis of right lower limb: Secondary | ICD-10-CM | POA: Diagnosis not present

## 2023-11-27 LAB — BASIC METABOLIC PANEL WITH GFR
Anion gap: 7 (ref 5–15)
BUN: 13 mg/dL (ref 8–23)
CO2: 31 mmol/L (ref 22–32)
Calcium: 8 mg/dL — ABNORMAL LOW (ref 8.9–10.3)
Chloride: 95 mmol/L — ABNORMAL LOW (ref 98–111)
Creatinine, Ser: 1.07 mg/dL — ABNORMAL HIGH (ref 0.44–1.00)
GFR, Estimated: 55 mL/min — ABNORMAL LOW (ref >60)
Glucose, Bld: 85 mg/dL (ref 70–99)
Potassium: 3.3 mmol/L — ABNORMAL LOW (ref 3.5–5.1)
Sodium: 133 mmol/L — ABNORMAL LOW (ref 135–145)

## 2023-11-27 LAB — CBC
HCT: 26.1 % — ABNORMAL LOW (ref 36.0–46.0)
Hemoglobin: 8.3 g/dL — ABNORMAL LOW (ref 12.0–15.0)
MCH: 31.3 pg (ref 26.0–34.0)
MCHC: 31.8 g/dL (ref 30.0–36.0)
MCV: 98.5 fL (ref 80.0–100.0)
Platelets: 155 10*3/uL (ref 150–400)
RBC: 2.65 MIL/uL — ABNORMAL LOW (ref 3.87–5.11)
RDW: 16.4 % — ABNORMAL HIGH (ref 11.5–15.5)
WBC: 4.3 10*3/uL (ref 4.0–10.5)
nRBC: 0 % (ref 0.0–0.2)

## 2023-11-27 LAB — PHOSPHORUS: Phosphorus: 3.4 mg/dL (ref 2.5–4.6)

## 2023-11-27 LAB — BRAIN NATRIURETIC PEPTIDE: B Natriuretic Peptide: 195 pg/mL — ABNORMAL HIGH (ref 0.0–100.0)

## 2023-11-27 LAB — TSH: TSH: 98.987 u[IU]/mL — ABNORMAL HIGH (ref 0.350–4.500)

## 2023-11-27 LAB — MAGNESIUM: Magnesium: 2.2 mg/dL (ref 1.7–2.4)

## 2023-11-27 MED ORDER — POTASSIUM CHLORIDE ER 10 MEQ PO TBCR: 10.0000 meq | EXTENDED_RELEASE_TABLET | Freq: Every day | ORAL | 0 refills | Status: DC

## 2023-11-27 MED ORDER — VANCOMYCIN HCL 125 MG PO CAPS: 125.0000 mg | ORAL_CAPSULE | ORAL | 3 refills | Status: DC

## 2023-11-27 MED ORDER — POTASSIUM CHLORIDE CRYS ER 20 MEQ PO TBCR
40.0000 meq | EXTENDED_RELEASE_TABLET | ORAL | Status: AC
  Administered 2023-11-27 (×2): 40 meq via ORAL
  Filled 2023-11-27 (×2): qty 2

## 2023-11-27 MED ORDER — LEVOTHYROXINE SODIUM 150 MCG PO TABS: 150.0000 ug | ORAL_TABLET | Freq: Every day | ORAL | 1 refills | Status: DC

## 2023-11-27 MED ORDER — CEPHALEXIN 500 MG PO CAPS: 500.0000 mg | ORAL_CAPSULE | Freq: Three times a day (TID) | ORAL | 0 refills | Status: AC

## 2023-11-27 MED ORDER — ASPIRIN EC 81 MG PO TBEC: 81.0000 mg | DELAYED_RELEASE_TABLET | Freq: Every day | ORAL | 11 refills | Status: DC

## 2023-11-27 NOTE — Discharge Summary (Signed)
 Amanda Conner, is a 73 y.o. female  DOB 1950-12-17  MRN [id number].  Admission date:  11/26/2023  Admitting Physician  Arnulfo Larch, MD  Discharge Date:  11/27/2023   Primary MD  Huston Maiers, MD  Recommendations for primary care physician for things to follow:   1)follow up with infectious disease team at [facility] (Dr. Rufus Council and Dr. Lelia Putnam Zavala)--[phone] and [phone] 1 week  2)Repeat CBC and BMP blood test in 5 to 7 days advised  3) follow up with Endocrinologist Dr. Frann Ivans, MD, Endocrinology, Diabetes & Metabolism-- [facility], [address] [place], [place] [address], Phone Number- tel:[phone]--- due to your abnormal thyroid  test 4) please call [phone] on Saturday, 11/29/2023 to get updated on your wound culture results  5) local wound care for right leg wound including Xeroform/petroleum dressing with bandage wraps as advised  6)Take oral vancomycin  capsules---- 1 capsule daily while on cephalexin  antibiotic, and continue to take 1 cap daily for 7 days after completing cephalexin  antibiotic treatment--then call Dr. Zavala for taper schedule after that  Admission Diagnosis  Leg edema, right [R60.0] Cellulitis of lower extremity [L03.119] Cellulitis, unspecified cellulitis site [L03.90]   Discharge Diagnosis  Leg edema, right [R60.0] Cellulitis of lower extremity [L03.119] Cellulitis, unspecified cellulitis site [L03.90]    Principal Problem:   Cellulitis of lower extremity Active Problems:   Recurrent Clostridioides difficile infection      Past Medical History:  Diagnosis Date   Arthritis    osteoarthritis   Back pain    Bronchiectasis (HCC)    Cancer (HCC)    basal cell nose   Chronic cough    Complication of anesthesia    Dyspnea    Gastrointestinal symptoms    GERD (gastroesophageal reflux disease)    Glaucoma    History of  hiatal hernia    Hyperlipemia    Hypertension    Multiple myeloma (HCC)    Osteopenia    Pneumonia    PONV (postoperative nausea and vomiting)    "only one time"   Spondylisthesis     Past Surgical History:  Procedure Laterality Date   ABDOMINAL EXPOSURE N/A 12/29/2018   Procedure: ABDOMINAL EXPOSURE;  Surgeon: Dannis Dy, MD;  Location: [facility] OR;  Service: Vascular;  Laterality: N/A;   ABDOMINOPLASTY  2002   ABDOMINOPLASTY  1970's   ANKLE SURGERY Left 10/2015   ANTERIOR LATERAL LUMBAR FUSION WITH PERCUTANEOUS SCREW 3 LEVEL Left 12/29/2018   Procedure: Lumbar Two to Lumbar Five Anterolateral lumbar interbody fusion;  Surgeon: Manya Sells, MD;  Location: [facility] OR;  Service: Neurosurgery;  Laterality: Left;  anterolateral   ANTERIOR LUMBAR FUSION N/A 12/29/2018   Procedure: Lumbar Five Sacral One Anterior lumbar interbody fusion;  Surgeon: Manya Sells, MD;  Location: [facility] OR;  Service: Neurosurgery;  Laterality: N/A;  anterior approach   BASAL CELL CARCINOMA EXCISION  06/2018   nose   BLEPHAROPLASTY Bilateral 1999   BREAST ENHANCEMENT SURGERY  1970's   COLONOSCOPY     CYSTOURETHROSCOPY  2018   Doble J ureteal stents   DECOMPRESSION CORE HIP Left 2014   FACIAL COSMETIC SURGERY  2004   FLUOROSCOPY GUIDANCE     HIP ARTHROPLASTY Left    INCISIONAL HERNIA REPAIR N/A 10/08/2019   Procedure: OPEN INCISIONAL HERNIA REPAIR WITH MESH;  Surgeon: Oralee Billow, MD;  Location: [facility] ORS;  Service: General;  Laterality: N/A;   JOINT REPLACEMENT Left    Hip; 05/2014   KYPHOPLASTY N/A 08/31/2021   Procedure: KYPHOPLASTY THORACIC ELEVEN, THORACIC TWELVE;  Surgeon: Pincus Bridgeman, DO;  Location: [facility] OR;  Service: Neurosurgery;  Laterality: N/A;   LAPAROSCOPIC PARTIAL COLECTOMY  06/26/2017   LUMBAR PERCUTANEOUS PEDICLE SCREW 4 LEVEL N/A 12/29/2018   Procedure: Lumbar Percutaneous Pedicle Screw Placement Lumbar two-Sacral one;  Surgeon: Manya Sells, MD;  Location: [facility] OR;  Service:  Neurosurgery;  Laterality: N/A;   MOHS SURGERY  2019   nose   PARTIAL COLECTOMY  06/2017   for diverticulitis   REMOVAL OF BILATERAL TISSUE EXPANDERS WITH PLACEMENT OF BILATERAL BREAST IMPLANTS  2004   THIGH LIFT  1994   TOE FUSION Right    great toe   TONSILLECTOMY     UMBILICAL HERNIA REPAIR     with tummy tuck   UMBILICAL HERNIA REPAIR  2002       HPI  from the history and physical done on the day of admission:   HPI:  Amanda Conner is a 73 y.o. 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 Reports that yesterday noted pain near the ankle on the R. Today with new wound at the medial ankle with clear/ yellow, bloody drainage, and rapid spreading erythema around this area. Also having significant pain near the ankle. Otherwise reports having more chronic bilateral LE edema, but this has worsened in both legs in a symmetric fashion over past few weeks. In the past day noting that now has new asymmetric RLE swelling. No change in chronic SOB. No chest pain. She reports on and off fever / chills, but none acutely with the recent skin changes above.      Review of Systems:  + chronic loose stools, not currently c/w prior C diff episodes.  + chronic dyspnea relates to NTM lung infection    ROS complete and negative except as marked above    Hospital Course:     Brief Narrative:  73 y.o. 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     -Assessment and Plan: 1)Rt Leg  Cellulitis--- please see photos in epic - No fevers or leukocytosis (at baseline patient has chronic leukopenia in the setting of bone marrow disorder/multiple myeloma) - Patient reports subjective chills - Exam of Rt Leg showed much improved erythema and much improved swelling and much improved tenderness (much improved rt Leg Medial wound at the medial ankle without further drainage) WBC 3.9 >>4.7 >>4.3 -- Treated with IV vancomycin -- -blood cultures NGTD -Right leg wound cultures obtained on 11/26/2023 about 24 hours after antibiotics were initiated--remains NGTD Discussed with  patient's infectious disease  team at [facility] ( Dr. Lelia Putnam Zavala)--[phone] and [phone] ID recommendations appreciated   2)Rt Leg Swelling + Pain + Redness-patient is sedentary, on estrogen therapy - Venous Dopplers without DVT - Elevate right lower extremity - Please see initial photos in epic--- -no follow-up photos were taken however patient right leg erythema, warmth, tenderness and swelling as well as drainage improved significantly after treatment with vancomycin  IV prior to discharge   3)Recurrent C diff Infection---7 prior episodes of  C diff Colitis--currently completing oral Vanco taper with plan for upcoming FMT on 12/01/23--- after coming off oral Vanco taper --Discussed with  patient's infectious disease team at [facility] ( Dr. Lelia Putnam Zavala)--[phone] and [phone] ID recommendations appreciated, she recommends continuing oral vancomycin  125 mg p.o. daily while on antibiotics and for 7 days after completing systemic antibiotics--with recent follow-up we discussed further taper of oral Vanco after that with patient ----prior to admission patient has been tapered down to oral vancomycin  on a twice weekly   4)Reported stage I pressure wound sacrum, present on admission  - Pressure injury prevention discussed patient and daughter -   5)HypoNatremia--- sodium up to 133 from 129 -  Maintain adequate hydration -Avoid excessive free water   6) generalized weakness/ambulatory dysfunction--- patient uses walker to transfer and wheelchair for ambulation at baseline  7)Severe hypothyroidism--- TSH 98.98, free T3 is less than 0.3, patient with significant fatigue and generalized weakness -Start levothyroxine  150 mcg daily -Follow-up with endocrinologist Dr. Nida Gebreselassie as advised  8) acute on chronic anemia--no bleeding concerns noted -Hemoglobin down to 8.3, recently Hgb was around 10 -Repeat CBC as outpatient within a week advised   Chronic medical problems: History multiple myeloma: Continue home Revlimid , aspirin  for thromboprophylaxis.  Leukopenia, anemia: Stable hgb, WBC normalized in setting of multiple myeloma  Chronic nontuberculous mycobacterial infection of the lung, bronchiectasis: Not on antimicrobial treatment at this time.  Follows with ID at [facility].  Continue PTA bronchodilators Hypertension: Continue home lisinopril  Glaucoma: Home latanoprost  GTTs.    Discharge Condition: Stable  Follow UP   Follow-up Information     Brannon Calamity, Magdalena Scholz, MD. Schedule an appointment as soon as possible for a visit in 1 week(s).   Specialty: Infectious Diseases Why: Follow-up in 1 week Contact information: [address] [place] [address] [phone]         Baby Bolt, MD. Schedule an appointment as soon as possible for a visit in 2 week(s).   Specialty: Endocrinology Why: For abnormal thyroid  test Contact information: [address] [place] [address] [phone]         Huston Maiers, MD. Schedule an appointment as soon as possible for a visit in 5 day(s).   Specialty: Family Medicine Why: For repeat CBC and BMP blood test Contact information: [facility] [address] [place] [address] [phone]                  Consults obtained -discussed with  patient's ID physician Dr. Zavala at [facility]  Diet and Activity recommendation:  As advised  Discharge Instructions    Discharge Instructions     Call MD for:  persistant nausea and vomiting   Complete by: As directed    Call MD for:  redness, tenderness, or signs of infection (pain, swelling, redness, odor or green/yellow discharge around incision site)   Complete by: As directed    Call MD for:  temperature >100.4   Complete by: As  directed    Diet - low sodium heart healthy   Complete by: As directed    Discharge instructions   Complete by: As directed    1)follow up with infectious disease team at [facility] (Dr. Rufus Council and Dr. Lelia Putnam Zavala)--[phone] and [phone] 1 week  2)Repeat CBC and BMP blood test in 5 to 7 days advised  3) follow up with Endocrinologist Dr. Frann Ivans, MD, Endocrinology, Diabetes & Metabolism-- [facility], [address] [place], [place] [address], Phone Number- tel:[phone]--- due to your abnormal thyroid  test 4) please call [phone] on Saturday, 11/29/2023 to get updated on your wound culture results  5) local wound care for right leg wound including Xeroform/petroleum dressing with bandage wraps as advised  6)Take oral vancomycin  capsules---- 1 capsule daily while on cephalexin  antibiotic, and continue to take 1 cap daily for 7 days after completing cephalexin  antibiotic treatment--then call Dr. Zavala for taper schedule after that   Discharge wound care:   Complete by: As directed    local wound care for right leg wound including Xeroform/petroleum dressing with bandage wraps as advised   Increase activity slowly   Complete by: As directed          Discharge Medications     Allergies as of 11/27/2023       Reactions   Tobramycin-dexamethasone  Other (See Comments)   Itching and swelling   Ethambutol Other (See Comments)   Fever   Neomycin-polymyxin-dexameth Itching, Swelling   Amikacin Other (See  Comments)   Eosinophilia with chills and aching when given with cefoxitin   Biaxin [clarithromycin] Itching   Cefoxitin Other (See Comments)   Eosinophlia when given with amikacin   Sulfamethoxazole-trimethoprim Itching   Vancomycin     "Got red splotches on skin after several doses"   Bacitracin-polymyxin B Rash        Medication List     STOP taking these medications    dicyclomine  10 MG capsule Commonly known as: BENTYL    potassium chloride  20 MEQ packet Commonly known as: KLOR-CON  Replaced by: potassium chloride  10 MEQ tablet       TAKE these medications    albuterol  108 (90 Base) MCG/ACT inhaler Commonly known as: VENTOLIN  HFA Inhale 2 puffs into the lungs every 6 (six) hours as needed for wheezing or shortness of breath.   Anoro Ellipta  62.5-25 MCG/ACT Aepb Generic drug: umeclidinium-vilanterol Inhale 1 puff into the lungs daily.   aspirin  EC 81 MG tablet Take 1 tablet (81 mg total) by mouth daily with breakfast. Swallow whole. What changed: when to take this   benzonatate  100 MG capsule Commonly known as: TESSALON  Take 100 mg by mouth 3 (three) times daily as needed for cough.   Biotin  1000 MCG tablet Take 1,000 mcg by mouth daily.   cephALEXin  500 MG capsule Commonly known as: KEFLEX  Take 1 capsule (500 mg total) by mouth 3 (three) times daily for 5 days.   dextromethorphan -guaiFENesin  30-600 MG 12hr tablet Commonly known as: MUCINEX  DM Take 1 tablet by mouth daily.   estrogen (conjugated)-medroxyprogesterone  0.45-1.5 MG tablet Commonly known as: PREMPRO Take 1 tablet by mouth daily.   feeding supplement Liqd Take 237 mLs by mouth 2 (two) times daily between meals.   furosemide  20 MG tablet Commonly known as: LASIX  TAKE 1 TABLET BY MOUTH EVERY DAY AS NEEDED   HYDROcodone  bit-homatropine 5-1.5 MG/5ML syrup Commonly known as: Hycodan Take 5 mLs by mouth every 6 (six) hours as needed for cough.   hydroxychloroquine 200 MG  tablet Commonly  known as: PLAQUENIL Take 200 mg by mouth daily.   Lactulose  20 GM/30ML Soln Take 30 ml by mouth every 3 hours until bowel movement is had, then take 30 ml daily   latanoprost  0.005 % ophthalmic solution Commonly known as: XALATAN  Place 1 drop into both eyes at bedtime.   lenalidomide  10 MG capsule Commonly known as: REVLIMID  Take 1 capsule (10 mg total) by mouth daily. 21 days on, 7 days off   levothyroxine  150 MCG tablet Commonly known as: Synthroid  Take 1 tablet (150 mcg total) by mouth daily.   lisinopril  20 MG tablet Commonly known as: ZESTRIL  Take 20 mg by mouth daily.   loratadine  10 MG tablet Commonly known as: CLARITIN  Take 10 mg by mouth daily.   methocarbamol  500 MG tablet Commonly known as: ROBAXIN  Take 1 tablet (500 mg total) by mouth every 6 (six) hours as needed for muscle spasms.   omeprazole 20 MG capsule Commonly known as: PRILOSEC Take 20 mg by mouth daily.   ondansetron  4 MG disintegrating tablet Commonly known as: Zofran  ODT Take 1 tablet (4 mg total) by mouth every 4 (four) hours as needed for nausea or vomiting.   potassium chloride  10 MEQ tablet Commonly known as: KLOR-CON  Take 1 tablet (10 mEq total) by mouth daily. Take while taking furosemide /Lasix  Replaces: potassium chloride  20 MEQ packet   predniSONE  5 MG tablet Commonly known as: DELTASONE  Take 5 mg by mouth daily as needed.   vancomycin  125 MG capsule Commonly known as: VANCOCIN  Take 1 capsule (125 mg total) by mouth See admin instructions. - 1 capsule daily while on cephalexin  antibiotic, and continue to take 1 cap daily for 7 days after completing cephalexin  antibiotic--then call Dr. Zavala for taper schedule after that What changed:  when to take this additional instructions   Vitamin D3 1.25 MG (50000 UT) Caps Take 1 capsule by mouth once a week.               Discharge Care Instructions  (From admission, onward)           Start     Ordered   11/27/23 0000   Discharge wound care:       Comments: local wound care for right leg wound including Xeroform/petroleum dressing with bandage wraps as advised   11/27/23 1440            Major procedures and Radiology Reports - PLEASE review detailed and final reports for all details, in brief -   US  Venous Img Lower Unilateral Right (DVT) Result Date: 11/26/2023 CLINICAL DATA:  Swelling, redness and edema EXAM: Right LOWER EXTREMITY VENOUS DOPPLER ULTRASOUND TECHNIQUE: Gray-scale sonography with graded compression, as well as color Doppler and duplex ultrasound were performed to evaluate the lower extremity deep venous systems from the level of the common femoral vein and including the common femoral, femoral, profunda femoral, popliteal and calf veins including the posterior tibial, peroneal and gastrocnemius veins when visible. The superficial great saphenous vein was also interrogated. Spectral Doppler was utilized to evaluate flow at rest and with distal augmentation maneuvers in the common femoral, femoral and popliteal veins. COMPARISON:  None Available. FINDINGS: Contralateral Common Femoral Vein: Respiratory phasicity is normal and symmetric with the symptomatic side. No evidence of thrombus. Normal compressibility. Common Femoral Vein: No evidence of thrombus. Normal compressibility, respiratory phasicity and response to augmentation. Saphenofemoral Junction: No evidence of thrombus. Normal compressibility and flow on color Doppler imaging. Profunda Femoral Vein: No evidence of  thrombus. Normal compressibility and flow on color Doppler imaging. Femoral Vein: No evidence of thrombus. Normal compressibility, respiratory phasicity and response to augmentation. Popliteal Vein: No evidence of thrombus. Normal compressibility, respiratory phasicity and response to augmentation. Calf Veins: No evidence of thrombus. Normal compressibility and flow on color Doppler imaging. Superficial Great Saphenous Vein: No evidence  of thrombus. Normal compressibility. Venous Reflux:  None. Other Findings:  None. IMPRESSION: No evidence of right lower extremity DVT. Electronically Signed   By: Adrianna Horde M.D.   On: 11/26/2023 11:09   DG Tibia/Fibula Right Result Date: 11/26/2023 CLINICAL DATA:  eval for gas Delayed due to labs (patient a hard stick): eval for gas - leg edema and wound infection EXAM: RIGHT TIBIA AND FIBULA - 2 VIEW COMPARISON:  None Available. FINDINGS: Limited evaluation due to overlapping osseous structures and overlying soft tissues. No cortical erosion or destruction. There is no evidence of fracture or other focal bone lesions. Diffuse subcutaneus soft tissue edema. No radiographic finding of subcutaneus soft tissue emphysema. IMPRESSION: 1. Diffuse subcutaneus soft tissue edema. 2. No radiographic finding of subcutaneus soft tissue emphysema. Electronically Signed   By: Morgane  Naveau M.D.   On: 11/26/2023 02:33   Micro Results   Recent Results (from the past 240 hours)  Blood culture (routine x 2)     Status: None (Preliminary result)   Collection Time: 11/26/23  1:44 AM   Specimen: BLOOD  Result Value Ref Range Status   Specimen Description BLOOD BLOOD LEFT ARM  Final   Special Requests   Final    BOTTLES DRAWN AEROBIC AND ANAEROBIC Blood Culture adequate volume   Culture   Final    NO GROWTH 1 DAY Performed at [facility], [address], [place], [place] [address]    Report Status PENDING  Incomplete  Blood culture (routine x 2)     Status: None (Preliminary result)   Collection Time: 11/26/23  2:13 AM   Specimen: BLOOD  Result Value Ref Range Status   Specimen Description BLOOD BLOOD LEFT ARM  Final   Special Requests   Final    BOTTLES DRAWN AEROBIC AND ANAEROBIC Blood Culture adequate volume   Culture   Final    NO GROWTH 1 DAY Performed at [facility], [address] [place], [place] [address]    Report Status PENDING  Incomplete  Aerobic/Anaerobic Culture w Gram Stain  (surgical/deep wound)     Status: None (Preliminary result)   Collection Time: 11/26/23  2:05 PM   Specimen: Wound  Result Value Ref Range Status   Specimen Description   Final    WOUND Performed at [facility], [address] [place], [place] [address]    Special Requests   Final    NONE Performed at [facility], [address], [place], [place] [address]    Gram Stain   Final    RARE WBC PRESENT, PREDOMINANTLY PMN NO ORGANISMS SEEN    Culture   Final    NO GROWTH < 24 HOURS Performed at [facility], [address], [place], [place] [address]    Report Status PENDING  Incomplete    Today   Subjective    Christain Mcraney today has no new complaints - Patient's daughter at bedside, questions answered No fever  Or chills   No Nausea, Vomiting or Diarrhea         Patient has been seen and examined prior to discharge   Objective   Blood pressure Aaron Aas)  143/67, pulse (!) 57, temperature 98 F (36.7 C), temperature source Oral, resp. rate 18, height 5\' 2"  (1.575 m), weight 49.4 kg, SpO2 93%.   Intake/Output Summary (Last 24 hours) at 11/27/2023 1443 Last data filed at 11/27/2023 0900 Gross per 24 hour  Intake 490 ml  Output --  Net 490 ml    Exam Gen:- Awake Alert, no acute distress  HEENT:- .AT, No sclera icterus Neck-Supple Neck,No JVD,.  Lungs-  CTAB , good air movement bilaterally CV- S1, S2 normal, regular Abd-  +ve B.Sounds, Abd Soft, No tenderness,    Extremity/Skin:- pedal pulses +ve Psych-affect is appropriate, oriented x3 Neuro-generalized weakness, no new focal deficits, no tremors MSK--much improved right leg swelling, much improved redness, improved warmth and much improved tenderness----  Data Review   CBC w Diff:  Lab Results  Component Value Date   WBC 4.3 11/27/2023   HGB 8.3 (L) 11/27/2023   HCT 26.1 (L) 11/27/2023   PLT 155 11/27/2023   LYMPHOPCT 25 11/26/2023   MONOPCT 13 11/26/2023   EOSPCT 8 11/26/2023   BASOPCT 1  11/26/2023   CMP:  Lab Results  Component Value Date   NA 133 (L) 11/27/2023   K 3.3 (L) 11/27/2023   CL 95 (L) 11/27/2023   CO2 31 11/27/2023   BUN 13 11/27/2023   CREATININE 1.07 (H) 11/27/2023   PROT 6.7 11/26/2023   ALBUMIN  3.9 11/26/2023   BILITOT 0.6 11/26/2023   ALKPHOS 46 11/26/2023   AST 58 (H) 11/26/2023   ALT 31 11/26/2023  .  Total Discharge time is about 33 minutes  Colin Dawley M.D on 11/27/2023 at 2:43 PM  Go to [email or web address] -  for contact info  [facility] - Office  [phone]

## 2023-11-27 NOTE — Plan of Care (Signed)

## 2023-11-27 NOTE — Discharge Instructions (Signed)
 1)follow up with infectious disease team at [facility] (Dr. Rufus Council and Dr. Lelia Putnam Zavala)--[phone] and [phone] 1 week  2)Repeat CBC and BMP blood test in 5 to 7 days advised  3) follow up with Endocrinologist Dr. Frann Ivans, MD, Endocrinology, Diabetes & Metabolism-- [facility], [address] [place], [place] [address], Phone Number- tel:[phone]--- due to your abnormal thyroid  test 4) please call [phone] on Saturday, 11/29/2023 to get updated on your wound culture results  5) local wound care for right leg wound including Xeroform/petroleum dressing with bandage wraps as advised  6)Take oral vancomycin  capsules---- 1 capsule daily while on cephalexin antibiotic, and continue to take 1 cap daily for 7 days after completing cephalexin antibiotic treatment--then call Dr. Zavala for taper schedule after that

## 2023-11-28 ENCOUNTER — Telehealth: Payer: Self-pay | Admitting: Nurse Practitioner

## 2023-11-28 NOTE — Telephone Encounter (Signed)
 Pt was currently in the hospital. I will create a refill but can you tell me the DX and I will forward to Nida if need be and/or if this is urgent?

## 2023-11-28 NOTE — Telephone Encounter (Signed)
 I am assuming it is for hyponatremia?  But the visit was pretty vague to me, I would send to him to be sure.

## 2023-11-29 LAB — T3, FREE: T3, Free: <0.3 pg/mL — ABNORMAL LOW (ref 2.0–4.4)

## 2023-12-01 LAB — CULTURE, BLOOD (ROUTINE X 2)
Culture: NO GROWTH
Culture: NO GROWTH
Special Requests: ADEQUATE
Special Requests: ADEQUATE

## 2023-12-02 LAB — AEROBIC/ANAEROBIC CULTURE W GRAM STAIN (SURGICAL/DEEP WOUND)

## 2023-12-14 DEATH — deceased

## 2023-12-25 ENCOUNTER — Inpatient Hospital Stay

## 2024-01-01 ENCOUNTER — Inpatient Hospital Stay: Admitting: Hematology

## 2024-01-01 ENCOUNTER — Inpatient Hospital Stay

## 2024-01-13 ENCOUNTER — Ambulatory Visit: Admitting: "Endocrinology
